# Patient Record
Sex: Female | Born: 1950 | Race: White | Hispanic: No | Smoking: Never smoker
Health system: Southern US, Community
[De-identification: ages and names within clinical notes are randomized; demographics above are authoritative.]

## PROBLEM LIST (undated history)

## (undated) DIAGNOSIS — M26609 Unspecified temporomandibular joint disorder, unspecified side: Secondary | ICD-10-CM

## (undated) DIAGNOSIS — Z8489 Family history of other specified conditions: Secondary | ICD-10-CM

## (undated) DIAGNOSIS — M1990 Unspecified osteoarthritis, unspecified site: Secondary | ICD-10-CM

## (undated) DIAGNOSIS — K219 Gastro-esophageal reflux disease without esophagitis: Secondary | ICD-10-CM

## (undated) DIAGNOSIS — S0300XA Dislocation of jaw, unspecified side, initial encounter: Secondary | ICD-10-CM

## (undated) DIAGNOSIS — G4730 Sleep apnea, unspecified: Secondary | ICD-10-CM

## (undated) DIAGNOSIS — M797 Fibromyalgia: Secondary | ICD-10-CM

## (undated) DIAGNOSIS — M2550 Pain in unspecified joint: Secondary | ICD-10-CM

## (undated) DIAGNOSIS — F32A Depression, unspecified: Secondary | ICD-10-CM

## (undated) DIAGNOSIS — K222 Esophageal obstruction: Secondary | ICD-10-CM

## (undated) DIAGNOSIS — K589 Irritable bowel syndrome without diarrhea: Secondary | ICD-10-CM

## (undated) DIAGNOSIS — T7840XA Allergy, unspecified, initial encounter: Secondary | ICD-10-CM

## (undated) DIAGNOSIS — F329 Major depressive disorder, single episode, unspecified: Secondary | ICD-10-CM

## (undated) DIAGNOSIS — E785 Hyperlipidemia, unspecified: Secondary | ICD-10-CM

## (undated) DIAGNOSIS — R55 Syncope and collapse: Secondary | ICD-10-CM

## (undated) DIAGNOSIS — L719 Rosacea, unspecified: Secondary | ICD-10-CM

## (undated) DIAGNOSIS — F419 Anxiety disorder, unspecified: Secondary | ICD-10-CM

## (undated) DIAGNOSIS — R531 Weakness: Secondary | ICD-10-CM

## (undated) DIAGNOSIS — M2540 Effusion, unspecified joint: Secondary | ICD-10-CM

## (undated) DIAGNOSIS — M549 Dorsalgia, unspecified: Secondary | ICD-10-CM

## (undated) DIAGNOSIS — K649 Unspecified hemorrhoids: Secondary | ICD-10-CM

## (undated) DIAGNOSIS — G8929 Other chronic pain: Secondary | ICD-10-CM

## (undated) DIAGNOSIS — M722 Plantar fascial fibromatosis: Secondary | ICD-10-CM

## (undated) DIAGNOSIS — Z8709 Personal history of other diseases of the respiratory system: Secondary | ICD-10-CM

## (undated) DIAGNOSIS — H269 Unspecified cataract: Secondary | ICD-10-CM

## (undated) HISTORY — DX: Depression, unspecified: F32.A

## (undated) HISTORY — DX: Esophageal obstruction: K22.2

## (undated) HISTORY — DX: Unspecified temporomandibular joint disorder, unspecified side: M26.609

## (undated) HISTORY — DX: Irritable bowel syndrome, unspecified: K58.9

## (undated) HISTORY — DX: Rosacea, unspecified: L71.9

## (undated) HISTORY — DX: Major depressive disorder, single episode, unspecified: F32.9

## (undated) HISTORY — PX: VARICOSE VEIN SURGERY: SHX832

## (undated) HISTORY — DX: Gastro-esophageal reflux disease without esophagitis: K21.9

## (undated) HISTORY — PX: EYE SURGERY: SHX253

## (undated) HISTORY — DX: Dislocation of jaw, unspecified side, initial encounter: S03.00XA

## (undated) HISTORY — DX: Unspecified osteoarthritis, unspecified site: M19.90

## (undated) HISTORY — PX: COLONOSCOPY: SHX174

## (undated) HISTORY — DX: Fibromyalgia: M79.7

## (undated) HISTORY — PX: COLONOSCOPY WITH ESOPHAGOGASTRODUODENOSCOPY (EGD) AND ESOPHAGEAL DILATION (ED): SHX6495

## (undated) HISTORY — DX: Plantar fascial fibromatosis: M72.2

## (undated) HISTORY — PX: FOOT SURGERY: SHX648

## (undated) HISTORY — DX: Unspecified cataract: H26.9

## (undated) HISTORY — DX: Syncope and collapse: R55

## (undated) HISTORY — DX: Allergy, unspecified, initial encounter: T78.40XA

## (undated) HISTORY — PX: UPPER GASTROINTESTINAL ENDOSCOPY: SHX188

## (undated) HISTORY — DX: Hyperlipidemia, unspecified: E78.5

---

## 1968-07-12 HISTORY — PX: NASAL SEPTUM SURGERY: SHX37

## 1979-07-13 HISTORY — PX: TONSILLECTOMY: SUR1361

## 1992-07-12 HISTORY — PX: KNEE ARTHROSCOPY: SUR90

## 1995-07-13 HISTORY — PX: ABDOMINAL HYSTERECTOMY: SHX81

## 1998-06-18 ENCOUNTER — Other Ambulatory Visit: Admission: RE | Admit: 1998-06-18 | Discharge: 1998-06-18 | Payer: Self-pay | Admitting: *Deleted

## 1999-06-23 ENCOUNTER — Other Ambulatory Visit: Admission: RE | Admit: 1999-06-23 | Discharge: 1999-06-23 | Payer: Self-pay | Admitting: Obstetrics and Gynecology

## 2001-10-27 ENCOUNTER — Emergency Department (HOSPITAL_COMMUNITY): Admission: EM | Admit: 2001-10-27 | Discharge: 2001-10-27 | Payer: Self-pay | Admitting: Emergency Medicine

## 2001-10-27 ENCOUNTER — Encounter: Payer: Self-pay | Admitting: Emergency Medicine

## 2002-07-12 HISTORY — PX: CARPAL TUNNEL RELEASE: SHX101

## 2002-09-20 ENCOUNTER — Encounter: Payer: Self-pay | Admitting: Internal Medicine

## 2002-10-12 ENCOUNTER — Ambulatory Visit (HOSPITAL_COMMUNITY): Admission: RE | Admit: 2002-10-12 | Discharge: 2002-10-12 | Payer: Self-pay | Admitting: Orthopedic Surgery

## 2002-12-07 ENCOUNTER — Ambulatory Visit (HOSPITAL_COMMUNITY): Admission: RE | Admit: 2002-12-07 | Discharge: 2002-12-07 | Payer: Self-pay | Admitting: Orthopedic Surgery

## 2003-01-25 ENCOUNTER — Encounter: Admission: RE | Admit: 2003-01-25 | Discharge: 2003-02-20 | Payer: Self-pay | Admitting: Orthopedic Surgery

## 2003-06-24 ENCOUNTER — Encounter: Admission: RE | Admit: 2003-06-24 | Discharge: 2003-07-02 | Payer: Self-pay | Admitting: Orthopedic Surgery

## 2003-07-13 HISTORY — PX: TRIGGER FINGER RELEASE: SHX641

## 2004-08-26 ENCOUNTER — Encounter: Admission: RE | Admit: 2004-08-26 | Discharge: 2004-09-29 | Payer: Self-pay | Admitting: Orthopedic Surgery

## 2004-11-13 ENCOUNTER — Ambulatory Visit (HOSPITAL_BASED_OUTPATIENT_CLINIC_OR_DEPARTMENT_OTHER): Admission: RE | Admit: 2004-11-13 | Discharge: 2004-11-13 | Payer: Self-pay | Admitting: Orthopedic Surgery

## 2004-11-13 ENCOUNTER — Encounter (INDEPENDENT_AMBULATORY_CARE_PROVIDER_SITE_OTHER): Payer: Self-pay | Admitting: Specialist

## 2004-11-13 ENCOUNTER — Ambulatory Visit (HOSPITAL_COMMUNITY): Admission: RE | Admit: 2004-11-13 | Discharge: 2004-11-13 | Payer: Self-pay | Admitting: Orthopedic Surgery

## 2005-01-04 ENCOUNTER — Ambulatory Visit: Payer: Self-pay | Admitting: Internal Medicine

## 2005-02-01 ENCOUNTER — Encounter: Admission: RE | Admit: 2005-02-01 | Discharge: 2005-03-31 | Payer: Self-pay | Admitting: Family Medicine

## 2005-04-07 ENCOUNTER — Ambulatory Visit: Payer: Self-pay | Admitting: Internal Medicine

## 2005-08-27 ENCOUNTER — Ambulatory Visit (HOSPITAL_BASED_OUTPATIENT_CLINIC_OR_DEPARTMENT_OTHER): Admission: RE | Admit: 2005-08-27 | Discharge: 2005-08-27 | Payer: Self-pay | Admitting: Orthopedic Surgery

## 2005-08-27 ENCOUNTER — Encounter (INDEPENDENT_AMBULATORY_CARE_PROVIDER_SITE_OTHER): Payer: Self-pay | Admitting: Specialist

## 2006-01-21 ENCOUNTER — Ambulatory Visit (HOSPITAL_BASED_OUTPATIENT_CLINIC_OR_DEPARTMENT_OTHER): Admission: RE | Admit: 2006-01-21 | Discharge: 2006-01-21 | Payer: Self-pay | Admitting: Orthopedic Surgery

## 2006-01-21 HISTORY — PX: KNEE SURGERY: SHX244

## 2006-12-13 ENCOUNTER — Ambulatory Visit: Payer: Self-pay | Admitting: Internal Medicine

## 2006-12-13 LAB — CONVERTED CEMR LAB
ALT: 18 units/L (ref 0–40)
AST: 17 units/L (ref 0–37)
Albumin: 4.3 g/dL (ref 3.5–5.2)
Alkaline Phosphatase: 68 units/L (ref 39–117)
BUN: 18 mg/dL (ref 6–23)
Basophils Absolute: 0 10*3/uL (ref 0.0–0.1)
Basophils Relative: 0.5 % (ref 0.0–1.0)
Bilirubin, Direct: 0.1 mg/dL (ref 0.0–0.3)
CO2: 31 meq/L (ref 19–32)
Calcium: 9.5 mg/dL (ref 8.4–10.5)
Chloride: 101 meq/L (ref 96–112)
Creatinine, Ser: 0.6 mg/dL (ref 0.4–1.2)
Eosinophils Absolute: 0.2 10*3/uL (ref 0.0–0.6)
Eosinophils Relative: 2.1 % (ref 0.0–5.0)
GFR calc Af Amer: 133 mL/min
GFR calc non Af Amer: 110 mL/min
Glucose, Bld: 102 mg/dL — ABNORMAL HIGH (ref 70–99)
HCT: 44.3 % (ref 36.0–46.0)
Hemoglobin: 15.4 g/dL — ABNORMAL HIGH (ref 12.0–15.0)
Lymphocytes Relative: 41.2 % (ref 12.0–46.0)
MCHC: 34.7 g/dL (ref 30.0–36.0)
MCV: 90.5 fL (ref 78.0–100.0)
Monocytes Absolute: 0.6 10*3/uL (ref 0.2–0.7)
Monocytes Relative: 8.3 % (ref 3.0–11.0)
Neutro Abs: 3.6 10*3/uL (ref 1.4–7.7)
Neutrophils Relative %: 47.9 % (ref 43.0–77.0)
Platelets: 320 10*3/uL (ref 150–400)
Potassium: 4.8 meq/L (ref 3.5–5.1)
RBC: 4.9 M/uL (ref 3.87–5.11)
RDW: 11.9 % (ref 11.5–14.6)
Sodium: 138 meq/L (ref 135–145)
Total Bilirubin: 0.8 mg/dL (ref 0.3–1.2)
Total Protein: 7.3 g/dL (ref 6.0–8.3)
WBC: 7.5 10*3/uL (ref 4.5–10.5)

## 2007-12-02 DIAGNOSIS — K219 Gastro-esophageal reflux disease without esophagitis: Secondary | ICD-10-CM

## 2007-12-02 DIAGNOSIS — K222 Esophageal obstruction: Secondary | ICD-10-CM | POA: Insufficient documentation

## 2007-12-02 DIAGNOSIS — F341 Dysthymic disorder: Secondary | ICD-10-CM

## 2007-12-02 DIAGNOSIS — K589 Irritable bowel syndrome without diarrhea: Secondary | ICD-10-CM

## 2007-12-02 DIAGNOSIS — R55 Syncope and collapse: Secondary | ICD-10-CM | POA: Insufficient documentation

## 2007-12-02 DIAGNOSIS — M797 Fibromyalgia: Secondary | ICD-10-CM

## 2007-12-26 ENCOUNTER — Telehealth: Payer: Self-pay | Admitting: Internal Medicine

## 2008-01-24 ENCOUNTER — Ambulatory Visit: Payer: Self-pay | Admitting: Internal Medicine

## 2008-01-24 DIAGNOSIS — R1031 Right lower quadrant pain: Secondary | ICD-10-CM

## 2008-01-24 DIAGNOSIS — R1310 Dysphagia, unspecified: Secondary | ICD-10-CM

## 2008-01-24 LAB — CONVERTED CEMR LAB
BUN: 14 mg/dL (ref 6–23)
Creatinine, Ser: 0.8 mg/dL (ref 0.4–1.2)

## 2008-01-30 ENCOUNTER — Ambulatory Visit: Payer: Self-pay | Admitting: Cardiology

## 2008-02-26 ENCOUNTER — Ambulatory Visit: Payer: Self-pay | Admitting: Internal Medicine

## 2008-02-26 LAB — HM COLONOSCOPY

## 2008-10-10 LAB — HM DEXA SCAN

## 2009-07-12 HISTORY — PX: OTHER SURGICAL HISTORY: SHX169

## 2009-10-06 ENCOUNTER — Ambulatory Visit: Payer: Self-pay | Admitting: Internal Medicine

## 2010-05-19 ENCOUNTER — Telehealth: Payer: Self-pay | Admitting: Internal Medicine

## 2010-07-12 HISTORY — PX: VITRECTOMY: SHX106

## 2010-07-12 HISTORY — PX: NISSEN FUNDOPLICATION: SHX2091

## 2010-07-24 ENCOUNTER — Encounter: Payer: Self-pay | Admitting: Internal Medicine

## 2010-07-28 ENCOUNTER — Telehealth: Payer: Self-pay | Admitting: Internal Medicine

## 2010-08-13 NOTE — Medication Information (Signed)
Summary: Dexilant/[facility]  Dexilant/[facility]   Imported By: Sherian Rein 07/30/2010 10:48:33  _____________________________________________________________________  External Attachment:    Type:   Image     Comment:   External Document

## 2010-08-13 NOTE — Progress Notes (Signed)
Summary: Medication refill  Phone Note Call from Patient Call back at [phone]   Caller: Patient Call For: Dr. Marina Goodell Reason for Call: Refill Medication Summary of Call: Needs a refill on her Dexilant sent to [facility] [phone] Initial call taken by: Karna Christmas,  July 28, 2010 9:16 AM  Follow-up for Phone Call        Rx. was faxed already to [facility].  [facility] [address]  July 30, 2010 8:59 AM

## 2010-08-13 NOTE — Assessment & Plan Note (Signed)
Summary: Worsening GERD   History of Present Illness Visit Type: Follow-up Visit Primary GI MD: Yancey Flemings MD Primary Provider: Georgiann Mccoy Requesting Provider: N/A Chief Complaint: GERD/Hiatal hernia discussion History of Present Illness:   60 year old white female with fibromyalgia, anxiety/depression, irritable bowel syndrome, and gastroesophageal reflux disease with peptic stricture. The patient was last evaluated in the office January 24, 2008 regarding abdominal pain, dysphagia, and recall colonoscopy. Colonoscopy and upper endoscopy were performed February 26, 2008. Colonoscopy was normal except for moderate sigmoid diverticulosis. Upper endoscopy revealed a peptic stricture, small hiatal hernia, and benign fundic gland polyps. She was dilated with the 54 [place] Maloney dilator GERD for reflux she takes omeprazole 20 mg daily. Today she is upset and concerned about ongoing and worsening reflux symptoms. She describes breakthrough heartburn as well as regurgitation. Symptoms are worse off medication. No recurrent dysphagia. She tells me that she is under a lot of stress at work. Stress seems to exacerbate symptoms. As well certain food items seem to exacerbate symptoms. She inquire about antireflux surgery. No nausea or vomiting. No weight loss.   GI Review of Systems    Reports abdominal pain, acid reflux, and  heartburn.     Location of  Abdominal pain: epigastric area.    Denies belching, bloating, chest pain, dysphagia with liquids, dysphagia with solids, loss of appetite, nausea, vomiting, vomiting blood, weight loss, and  weight gain.      Reports change in bowel habits and  irritable bowel syndrome.     Denies anal fissure, black tarry stools, constipation, diarrhea, diverticulosis, fecal incontinence, heme positive stool, hemorrhoids, jaundice, light color stool, liver problems, rectal bleeding, and  rectal pain. Preventive Screening-Counseling & Management  Alcohol-Tobacco  Smoking Status: never    Current Medications (verified): 1)  Metrogel 1 %  Gel (Metronidazole) .... As Needed 2)  Fexofenadine Hcl 180 Mg  Tabs (Fexofenadine Hcl) .... Take 1 Once Daily As Needed 3)  Omeprazole 20 Mg  Tbec (Omeprazole) .... Take 1 Once Daily 4)  Cymbalta 60 Mg  Cpep (Duloxetine Hcl) .... Take 1 Once Daily 5)  Crestor 10 Mg  Tabs (Rosuvastatin Calcium) .... Take I Once Daily 6)  Delphin 7.5mg  .... Once Daily 7)  Calcium Carbonate 333mg  .... Take 3 Tablets Daily 8)  Centrum Silver  Tabs (Multiple Vitamins-Minerals) .... Once Daily 9)  Aspir-Low 81 Mg Tbec (Aspirin) .... Once Daily 10)  Vitamin C Cr 500 Mg Cr-Tabs (Ascorbic Acid) .... Once Daily 11)  Fiber 625 Mg Tabs (Calcium Polycarbophil) .... Take 2 Tablets Daily 12)  Probiotic  Caps (Probiotic Product) .... Once Daily  Allergies: 1)  ! Flagyl 2)  ! Penicillin 3)  ! Ceftin 4)  ! * Cataflan 5)  ! * Chlorzoxazone 6)  ! * Oxycodone 7)  ! * Glucosamine 8)  ! Toradol 9)  ! Vicodin 10)  ! Olena Mater  Past History:  Past Medical History: Reviewed history from 12/02/2007 and no changes required. Current Problems:  GERD (ICD-530.81) ESOPHAGEAL STRICTURE (ICD-530.3) DEPRESSION/ANXIETY (ICD-300.4) VASOVAGAL SYNCOPE (ICD-780.2) FIBROMYALGIA (ICD-729.1) IBS (ICD-564.1)  Past Surgical History: Reviewed history from 01/24/2008 and no changes required. DEVIATED SEPTUM 1970 TONSILLECTOMY 1981 RT KNEE SURGERY 1994 COMPLETE HYSTERECTOMY 1997 CARPEL TUNNEL SURGERY RT AND LT 2004 LF FOOT SURGERY 11-20-04, 08-27-05 RT KNEE SURGERY 01-21-06 LF LEG VARICOSE VEIN REMOVED 12/07 RT LEG VARICOSE VEIN REMOVED 4/09  Family History: Reviewed history from 01/24/2008 and no changes required. FATHER-LUNG, SKIN AND PANCREATIC CANCER MOTHER-BREAST CANCER MOTHER HYPERTENSIONJ DIVERTICULITIS MOTHER  Social History: WORKS AT [employer] ONE CUP OF TEA DAILY SOCIAL ALCOHOL RARELY Patient has never smoked.  Alcohol Use  - no Smoking Status:  never  Review of Systems       The patient complains of allergy/sinus, arthritis/joint pain, back pain, depression-new, fatigue, night sweats, sleeping problems, and urine leakage.  The patient denies anemia, anxiety-new, blood in urine, breast changes/lumps, change in vision, confusion, cough, coughing up blood, fainting, fever, hearing problems, heart murmur, heart rhythm changes, itching, menstrual pain, muscle pains/cramps, nosebleeds, pregnancy symptoms, shortness of breath, skin rash, sore throat, swelling of feet/legs, swollen lymph glands, thirst - excessive , urination - excessive , urination changes/pain, vision changes, and voice change.    Vital Signs:  Patient profile:   60 year old female Height:      65 inches Weight:      173.25 pounds BMI:     28.93 Pulse rate:   92 / minute Pulse rhythm:   regular BP sitting:   120 / 70  (left arm) Cuff size:   regular  Vitals Entered By: June McMurray CMA Duncan Dull) (October 06, 2009 4:01 PM)  Physical Exam  General:  Well developed, well nourished, no acute distress. Head:  Normocephalic and atraumatic. Eyes:  PERRLA, no icterus. Ears:  Normal auditory acuity. Nose:  No deformity, discharge,  or lesions. Mouth:  No deformity or lesions, dentition normal. Neck:  Supple; no masses or thyromegaly. Lungs:  Clear throughout to auscultation. Heart:  Regular rate and rhythm; no murmurs, rubs,  or bruits. Abdomen:  Soft, nontender and nondistended. No masses, hepatosplenomegaly or hernias noted. Normal bowel sounds. Msk:  Symmetrical with no gross deformities. Normal posture. Pulses:  Normal pulses noted. Extremities:  No clubbing, cyanosis, edema or deformities noted. Neurologic:  Alert and  oriented x4;  grossly normal neurologically. Skin:  Intact without significant lesions or rashes. Psych:  Alert and cooperative. Normal mood and affect.   Impression & Recommendations:  Problem # 1:  GERD (ICD-530.81) The  patient seems to be experiencing worsening GERD symptoms as manifested by breakthrough pyrosis and regurgitation. We discussed medical treatment options. As well, since she inquired about antireflux surgery, we discussed this as well. To that end, would have decided on the following plan.  Plan: #1. Change from omeprazole 20 mg daily to Dexilant 60 mg daily. 25 days of samples provided. If this is effective, she will contact the office and we will provide a prescription #2. Reflux precautions #3. The patient might be interested in meeting with a general surgeon to discuss in more detail the pros and cons of antireflux surgery. However, she wishes to wait to see how Dexilant therapy works. We did discuss that if she were to move forward with an antireflux surgery, that esophageal manometry might be indicated.  Problem # 2:  ESOPHAGEAL STRICTURE (ICD-530.3) asymptomatic post dilation.  Plan: #1. Repeat esophageal dilation as needed for recurrent dysphagia  Problem # 3:  SCREENING COLORECTAL-CANCER (ICD-V76.51) negative screening exam August 2009. Routine followup screening around August 2019  Patient Instructions: 1)  Dexilant samples given to patient for 5 weeks to take one capsule by mouth once daily 2)  Call office back if Dexilant is working and a Rx. will be phoned in for you. 3)  If it doesn't work let our office know and we will possibly refer you to a surgeon for evaluation. 4)  The medication list was reviewed and reconciled.  All changed / newly prescribed medications were explained.  A complete medication list was provided to the patient / caregiver. 5)  printed and given to patient.[facility]  October 06, 2009 4:53 PM

## 2010-08-13 NOTE — Progress Notes (Signed)
Summary: Medication  Phone Note Call from Patient Call back at Home Phone [phone]   Caller: Patient Call For: Dr. Marina Goodell Reason for Call: Talk to Nurse Summary of Call: Needs a script for Dexilant 90 day supply.....[facility] Initial call taken by: Karna Christmas,  May 19, 2010 8:50 AM    Prescriptions: DEXILANT 60 MG CPDR (DEXLANSOPRAZOLE) 1 capsule by mouth once daily  #90 x 3   Entered by:   Milford Cage NCMA   Authorized by:   Hilarie Fredrickson MD   Signed by:   Milford Cage NCMA on 05/19/2010   Method used:   [facility]        [phone][facility] (retail)       [address]       [place], [place]  [address]       Ph: [phone]       Fax: [phone]   RxID:   [id number]

## 2010-08-31 ENCOUNTER — Ambulatory Visit (HOSPITAL_BASED_OUTPATIENT_CLINIC_OR_DEPARTMENT_OTHER)
Admission: RE | Admit: 2010-08-31 | Discharge: 2010-08-31 | Disposition: A | Discharge status: Home / Self Care | Payer: BC Managed Care – PPO | Attending: Orthopedic Surgery | Admitting: Orthopedic Surgery

## 2010-08-31 DIAGNOSIS — M23302 Other meniscus derangements, unspecified lateral meniscus, unspecified knee: Secondary | ICD-10-CM | POA: Insufficient documentation

## 2010-08-31 DIAGNOSIS — M2240 Chondromalacia patellae, unspecified knee: Secondary | ICD-10-CM | POA: Insufficient documentation

## 2010-08-31 DIAGNOSIS — M23329 Other meniscus derangements, posterior horn of medial meniscus, unspecified knee: Secondary | ICD-10-CM | POA: Insufficient documentation

## 2010-08-31 LAB — POCT HEMOGLOBIN-HEMACUE: Hemoglobin: 15.2 g/dL — ABNORMAL HIGH (ref 12.0–15.0)

## 2010-09-04 NOTE — Op Note (Signed)
NAMELARYA, Vanessa Oliver              ACCOUNT NO.:  [id number]  MEDICAL RECORD NO.:  [id number]          PATIENT TYPE:  AMB  LOCATION:  [facility]                         FACILITY:  [facility]  PHYSICIAN:  Marlowe Kays, M.D.  DATE OF BIRTH:  April 09, 1951  DATE OF PROCEDURE:  08/31/2010 DATE OF DISCHARGE:                              OPERATIVE REPORT   PREOPERATIVE DIAGNOSES: 1. Torn medial and lateral menisci. 2. Osteoarthritis, right knee.  POSTOPERATIVE DIAGNOSES: 1. Torn medial and lateral menisci. 2. Osteoarthritis, right knee.  OPERATIONS:  Right knee arthroscopy with: 1. Partial medial lateral meniscectomies. 2. Shaving of lateral femoral condyle. 3. Shaving of patella.  SURGEON:  Marlowe Kays, M.D.  ASSISTANT:  Nurse.  ANESTHESIA:  General.  PATHOLOGICAL INDICATION FOR PROCEDURE:  She has had a prior knee arthroscopy with a reinjury recently and an MRI demonstrating the preoperative diagnoses which were confirmed at surgery as discussed below.  DESCRIPTION OF PROCEDURE:  Satisfactory general anesthesia, Ace wrap, and knee support to left lower extremity, pneumatic tourniquet applied to right lower extremity with leg esmarched out nonsterilely. Tourniquet inflated and thigh stabilizer applied.  Right leg was then prepped with DuraPrep from stabilizer to ankle and draped in sterile field.  Time-out performed.  Superior medial saline inflow.  First, an anterolateral portal medial compartment knee joint was evaluated.  She had some wear of the medial femoral condyle which did not require shaving.  I would estimate grade 2 out of 4 chondromalacia. Posteriorly, the entire posterior 40% of meniscus was significantly torn, and I trimmed this back with baskets and shaved it down until smooth and stable with a 3.5 shaver with final pictures being taken.  I then reversed portals.  In the lateral compartment of the knee joint, she had severe disruption of lateral meniscus with a  fragment in the intercondylar area adjacent to the ACL.  There was some very slight wear of the most lateral portion of the lateral femoral condyle which I smoothed down.  I resected the lateral meniscus back to stable rim with a combination of baskets, arthroscopic scissors, and 3.5 shaver with final remnant being a nice stable meniscus.  This also included removing the portion of the meniscus which was adjacent to the ACL.  Looking at the lateral gutter and suprapatellar area, she had a good bit of postoperative adhesions and some grade 2/4 chondromalacia of the mid to medial portion of the patella which I shaved down as much as possible. Knee joint was then irrigated until clear, all fluid possible removed. I closed the 2 entry portals with 4-0 nylon and injected 20 cc of 0.5% Marcaine with adrenaline and 4 mg of morphine through the inflow apparatus which was removed, and this portal was closed with 4-0 nylon as well.  Betadine, Adaptic, dry sterile dressing were applied.  Tourniquet was released. At the time of this dictation, she was on her own to the recovery room in satisfactory condition with no known complications.          ______________________________ Marlowe Kays, M.D.     JA/MEDQ  D:  08/31/2010  T:  08/31/2010  Job:  [id number]  Electronically Signed by Marlowe Kays M.D. on 09/04/2010 04:22:51 PM

## 2010-09-07 ENCOUNTER — Telehealth: Payer: Self-pay | Admitting: Internal Medicine

## 2010-09-17 NOTE — Progress Notes (Signed)
Summary: Triage: GERD -  wants  surgical referral  Phone Note Call from Patient Call back at [phone]   Caller: Patient Call For: Dr. Marina Goodell Reason for Call: Talk to Nurse Summary of Call: Was told she was going to be referred to another specialist for her henia Initial call taken by: Karna Christmas,  September 07, 2010 10:59 AM  Follow-up for Phone Call        Patient calling and stating that the Dexilant is not working anymore. Patient states at last office visit she discussed with Dr. Marina Goodell a referral for anti-reflux surgery. Patient would like to proceed with this  referral now. Dr. Marina Goodell please advise. Follow-up by: Selinda Michaels RN,  September 07, 2010 11:21 AM  Additional Follow-up for Phone Call Additional follow up Details #1::         have her see Dr. Luretha Murphy for his surgical opinion regarding possible antireflux surgery Additional Follow-up by: Hilarie Fredrickson MD,  September 07, 2010 11:27 AM    Additional Follow-up for Phone Call Additional follow up Details #2::    Appt made for patient with Dr. Luretha Murphy for 10/02/10. Patient to be there at 10:45am for an 11:05am appointment. Patient aware of appointment date and time. Follow-up by: Selinda Michaels RN,  September 07, 2010 12:56 PM

## 2010-10-02 ENCOUNTER — Other Ambulatory Visit: Payer: Self-pay | Admitting: Surgery

## 2010-10-09 ENCOUNTER — Ambulatory Visit
Admission: RE | Admit: 2010-10-09 | Discharge: 2010-10-09 | Disposition: A | Discharge status: Home / Self Care | Payer: BC Managed Care – PPO | Source: Ambulatory Visit | Attending: Surgery | Admitting: Surgery

## 2010-11-18 ENCOUNTER — Encounter (HOSPITAL_COMMUNITY): Payer: BC Managed Care – PPO

## 2010-11-18 ENCOUNTER — Other Ambulatory Visit: Payer: Self-pay | Admitting: Surgery

## 2010-11-18 DIAGNOSIS — Z01812 Encounter for preprocedural laboratory examination: Secondary | ICD-10-CM | POA: Insufficient documentation

## 2010-11-18 DIAGNOSIS — K449 Diaphragmatic hernia without obstruction or gangrene: Secondary | ICD-10-CM | POA: Insufficient documentation

## 2010-11-18 DIAGNOSIS — K319 Disease of stomach and duodenum, unspecified: Secondary | ICD-10-CM | POA: Insufficient documentation

## 2010-11-18 DIAGNOSIS — IMO0001 Reserved for inherently not codable concepts without codable children: Secondary | ICD-10-CM | POA: Insufficient documentation

## 2010-11-18 DIAGNOSIS — K589 Irritable bowel syndrome without diarrhea: Secondary | ICD-10-CM | POA: Insufficient documentation

## 2010-11-18 DIAGNOSIS — Z79899 Other long term (current) drug therapy: Secondary | ICD-10-CM | POA: Insufficient documentation

## 2010-11-18 DIAGNOSIS — K219 Gastro-esophageal reflux disease without esophagitis: Secondary | ICD-10-CM | POA: Insufficient documentation

## 2010-11-18 DIAGNOSIS — K5730 Diverticulosis of large intestine without perforation or abscess without bleeding: Secondary | ICD-10-CM | POA: Insufficient documentation

## 2010-11-18 LAB — CBC
HCT: 43.6 % (ref 36.0–46.0)
Platelets: 280 10*3/uL (ref 150–400)
RDW: 12.6 % (ref 11.5–15.5)
WBC: 7.4 10*3/uL (ref 4.0–10.5)

## 2010-11-18 LAB — SURGICAL PCR SCREEN
MRSA, PCR: NEGATIVE
Staphylococcus aureus: NEGATIVE

## 2010-11-24 NOTE — Assessment & Plan Note (Signed)
[facility]                         GASTROENTEROLOGY OFFICE NOTE   Vanessa Oliver, Vanessa Oliver                     MRN:          [id number]  DATE:12/13/2006                            DOB:          [birth year]    The patient is self-referred.   REASON FOR EVALUATION:  Abdominal pain, change in bowel habits.   HISTORY:  This is a 60 year old white female with a history of irritable  bowel syndrome, fibromyalgia, vasovagal syncope, and anxiety with  depression. She presents herself today regarding ongoing management of  reflux as well as persistent problems with abdominal discomfort and  loose stools. The patient was last evaluated in this office April 07, 2005. See that dictation for details. She seems to require the  proton pump inhibitor therapy for control of reflux symptoms. Her reflux  symptoms are fairly classic, such as heartburn or indigestion. Her last  endoscopy was performed in 2004 and revealed a distal esophageal  stricture, small hiatal hernia. She also has irritable bowel syndrome  with a tendency toward diarrhea. Last colonoscopy at that same time  revealed sigmoid diverticulosis. No other abnormalities. The ileum was  normal.   Currently, she reports just not feeling well. Specifically, she has  had problems with right lower quadrant abdominal cramping, followed by  urgency and loose stools. Symptoms are exacerbated by meals as well as  stress. She has been under a great deal of stress with such issues as  her mother-in-law living with her and her husband, restructuring at  work, and lots of new cats at home. She also tells me that she had to  stop Celexa because of dark thoughts and is now on Cymbalta. She  denies nausea, vomiting, fevers, melena or hematochezia. Some  breakthrough heartburn, though no dysphagia. She denies any new  medications. She has lost a few pounds, though she this was intentional  due to diet. In terms of  her symptoms, she has tried fiber  supplementation which seemed to exacerbate the symptoms. Modifying her  diet was not helpful. She even tried over-the-counter digestive enzymes  without improvement. She has no nocturnal symptoms. She states that the  current symptoms are similar to her irritable bowel syndrome though much  more intense. She states that she has had thyroid testing at her  gynecologists office which was normal. No other laboratories or workup.   CURRENT MEDICATIONS:  1. Metrogel.  2. Omeprazole.  3. Cymbalta.  4. Allegra.  5. Flonase.   SHE HAS MULTIPLE DRUG ALLERGIES AND INTOLERANCES INCLUDING:  1. FLAGYL.  2. PENICILLIN.  3. CEFTIN.  4. CATAFLAM.  5. CHLORZOXAZONE.  6. OXYCODONE.  7. GLUCOSAMINE.  8. TORADOL.  9. SULFA.  10.CLARITIN.  11.GUAIFENESIN.  12.PENTAZOCINE/NALOXONE.   PHYSICAL EXAMINATION:  Somewhat anxious female in no acute distress.  Blood pressure 108/70, heart rate 88. Weight: 164 pounds (increased 1.2  pounds).  HEENT: Sclerae anicteric. Conjunctivae are pink. Oral mucosa intact. No  adenopathy.  LUNGS:  Are clear.  HEART: Is regular.  ABDOMEN: Soft without significant tenderness. No mass or hernia. Good  bowel sounds heard.   IMPRESSION:  1. Gastroesophageal reflux disease. Mild occasional breakthrough      symptoms.  2. Recent problems with right lower quadrant cramping and abdominal      pain, urgency and loose stools. Most likely irritable bowel      syndrome exacerbated principally by stress and meals.   RECOMMENDATIONS:  1. Probiotic Align one daily for two weeks.  2. Librax one before meals.  3. Refill omeprazole 40 mg daily.  4. Screening laboratories including CBC and comprehensive metabolic      panel.  5. Office followup in 4 to 6 weeks.  6. Ongoing general medical care with Dr.  Aurea Graff.     Wilhemina Bonito. Marina Goodell, MD  Electronically Signed    JNP/MedQ  DD: 12/13/2006  DT: 12/13/2006  Job #: [id number]

## 2010-11-25 ENCOUNTER — Ambulatory Visit (HOSPITAL_COMMUNITY)
Admission: RE | Admit: 2010-11-25 | Discharge: 2010-11-27 | Disposition: A | Discharge status: Home / Self Care | Payer: BC Managed Care – PPO | Source: Ambulatory Visit | Attending: Surgery | Admitting: Surgery

## 2010-11-25 DIAGNOSIS — K449 Diaphragmatic hernia without obstruction or gangrene: Secondary | ICD-10-CM | POA: Insufficient documentation

## 2010-11-25 DIAGNOSIS — K319 Disease of stomach and duodenum, unspecified: Secondary | ICD-10-CM | POA: Insufficient documentation

## 2010-11-25 DIAGNOSIS — K219 Gastro-esophageal reflux disease without esophagitis: Secondary | ICD-10-CM | POA: Insufficient documentation

## 2010-11-26 ENCOUNTER — Ambulatory Visit (HOSPITAL_COMMUNITY): Payer: BC Managed Care – PPO

## 2010-11-26 LAB — CBC
HCT: 40.7 % (ref 36.0–46.0)
Hemoglobin: 13.7 g/dL (ref 12.0–15.0)
MCH: 30.5 pg (ref 26.0–34.0)
MCHC: 33.7 g/dL (ref 30.0–36.0)
RDW: 12.4 % (ref 11.5–15.5)

## 2010-11-26 NOTE — Op Note (Signed)
  Vanessa Oliver, Vanessa Oliver              ACCOUNT NO.:  [id number]  MEDICAL RECORD NO.:  [id number]           PATIENT TYPE:  O  LOCATION:  [facility]                         FACILITY:  [facility]  PHYSICIAN:  Thornton Park. Daphine Deutscher, MD  DATE OF BIRTH:  [birth year]  DATE OF PROCEDURE:  11/25/2010 DATE OF DISCHARGE:                              OPERATIVE REPORT   CHIEF COMPLAINT:  GERD and terrible reflux, evidence of peptic stricture on endoscopy and upper GI.  PROCEDURE:  Laparoscopic repair of hiatal hernia with 2 sutures posteriorly including one with pledgets and Nissen fundoplication over a #56 dilator.  SURGEON:  Thornton Park. Daphine Deutscher, MD  ASSISTANT:  Ollen Gross. Vernell Morgans, M.D.  ANESTHESIA:  General endotracheal.  DESCRIPTION OF PROCEDURE:  This 60 year old white female was taken to room #1 on Wednesday, Nov 25, 2010, and given general anesthesia.  The abdomen was prepped with PCMX and draped sterilely.  Appropriate time- out was performed.  Access to the abdomen was achieved using a 0-degrees 10-mm Optiview without difficulty.  The abdomen was entered and insufflated.  Following insufflation, I placed standard trocars and I used a second 10 on the left for the camera, 10 to the right of midline and a 5 laterally on the right.  The Nathanson retractor was placed through an upper midline little incision and this retracted liver nicely exposing the hiatal hernia.  Hiatal hernia was reduced and taken down by tracking on the stomach and then using harmonic scalpel to take down the sac.  This was done really and it came down nicely, exposing the distal esophagus nicely and reintroducing the distal esophagus into the abdomen.  There was no tension on that.  I then got around first by delineating the right crus and then going over and taking down short gastrics and then doing a complete mobilization posterior.  Once this was done, with a Penrose around and a good exposure, I placed 2 sutures posteriorly,  the most posterior one without pledget and one up and more anterior had a pledget that approximated the hiatus nicely.  I felt like about what we needed to go with a 56 dilator.  Also the cardia was freely mobile and would nicely invaginate around the distal esophagus. I went ahead and passed a 56 dilator by Dr. Okey Dupre and then I performed the Nissen wrap using 3 sutures, the Endo stitch and tie knots.  Tie knots were also used in closing the crura.  A nice wrap was present. Minimal bleeding was noted and everything was controlled.  Wounds were injected with Marcaine and trocars were withdrawn and the skin was closed 4-0 Vicryl with Dermabond.  The patient was taken to the recovery room in satisfactory condition.     Thornton Park Daphine Deutscher, MD     MBM/MEDQ  D:  11/25/2010  T:  11/25/2010  Job:  [id number]  cc:   Bernadette Hoit, MD Fax: [phone]  Wilhemina Bonito. Marina Goodell, MD [address] [place] [address]  Electronically Signed by Luretha Murphy MD on 11/26/2010 07:08:57 AM

## 2010-11-27 NOTE — Op Note (Signed)
Vanessa Oliver, LAUFER              ACCOUNT NO.:  [id number]   MEDICAL RECORD NO.:  [id number]          PATIENT TYPE:  AMB   LOCATION:  [facility]                         FACILITY:  [facility]   PHYSICIAN:  Marlowe Kays, M.D.  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   OPERATION:  Excision of Morton's neuroma second and third web space left  foot.   SURGEON:  Marlowe Kays, M.D.   ASSISTANT:  Nurse.   ANESTHESIA:  General.   PATHOLOGY AND JUSTIFICATION FOR PROCEDURE:  She has had signs and symptoms  of Morton's neuroma with tenderness in the second and third web space with  temporary relief only with Xylocaine and steroid injection. Consequently she  is here for the above mentioned surgery.   DESCRIPTION OF PROCEDURE:  Satisfactory general anesthesia, pneumatic  tourniquet, leg was esmarched out nonsterilely and prepped with DuraPrep  from mid calf to toes and draped in a sterile field. I made a dorsal web  splitting incision splitting the second and third toes and with blunt  dissection dissected out a large Morton's neuroma. I grasped this with an  Allis clamp, using cutting cautery cut the distal digital branches to the  second and third toes and then freed up the congeal mass of nerve and scar  and amputated it proximal to the metatarsal heads. I then checked to make  sure there was no residual nerve remaining, specimen sent to pathology. I  released the tourniquet, there was no unusual bleeding. I infiltrated the  wound with 0.5% plain Marcaine and reapproximated the subcutaneous tissue  with interrupted 3-0 Vicryl and skin with interrupted 4-0 nylon. Betadine  Adaptic dry sterile dressing were applied. She tolerated the procedure well  and at  the time of this dictation was on her way to the recovery room in  satisfactory condition with no known complications.      JA/MEDQ  D:  11/13/2004  T:  11/13/2004  Job:  [id number]

## 2010-11-27 NOTE — Op Note (Signed)
   NAME:  NYKIAH, Vanessa Oliver                        ACCOUNT NO.:  [id number]   MEDICAL RECORD NO.:  [id number]                   PATIENT TYPE:  AMB   LOCATION:  DAY                                  FACILITY:  [facility]   PHYSICIAN:  Marlowe Kays, M.D.               DATE OF BIRTH:  11/28/1950   DATE OF PROCEDURE:  12/07/2002  DATE OF DISCHARGE:                                 OPERATIVE REPORT   PREOPERATIVE DIAGNOSES:  Left carpal tunnel syndrome, status post right  carpal tunnel release.   POSTOPERATIVE DIAGNOSES:  Left carpal tunnel syndrome, status post right  carpal tunnel release.   OPERATION:  Decompression of the median nerve, left wrist and hand.   SURGEON:  Illene Labrador. [place], M.D.   ASSISTANT:  Nurse.   ANESTHESIA:  IV regional.   PATHOLOGY AND JUSTIFICATION FOR PROCEDURE:  She has had an excellent result  from release on the right and is ready for release on the left.  Diagnosis  was established via her symptoms and nerve conduction studies.   DESCRIPTION OF PROCEDURE:  Satisfactory IV regional anesthesia, DuraPrep  from mid forearm to fingertips and was draped in a sterile field.  I marked  out a curved incision along the base of the thenar eminence, crossing  obliquely over the flexor crease of the wrist and the distal forearm.  The  palmaris longus tendon was identified and retracted and beneath it, the  median nerve was identified.  I then progressively released skin and  subcutaneous tissue and fascia into the distal palm.  Potential bleeders  were coagulated with bipolar cautery.  The main compression was in the mid  palm with partly due to muscle belly but also fascia.  Dissection was  carried out with a small hemostat in the mid palm and distal palm.  When we  felt the release had been completed, the wound was irrigated with sterile  saline, and skin and subcutaneous tissue only closed with interrupted 4-0  nylon mattress sutures.  Betadine, Adaptic dry sterile  dressing, volar  plaster splint were applied.  She tolerated the procedure well and was taken  to the recovery room in satisfactory condition with no known complications.                                               Marlowe Kays, M.D.    JA/MEDQ  D:  12/07/2002  T:  12/07/2002  Job:  [id number]

## 2010-11-27 NOTE — Op Note (Signed)
NAMEMALLOREY, ODONELL              ACCOUNT NO.:  [id number]   MEDICAL RECORD NO.:  [id number]          PATIENT TYPE:  AMB   LOCATION:  [facility]                         FACILITY:  [facility]   PHYSICIAN:  Marlowe Kays, M.D.  DATE OF BIRTH:  1951/04/27   DATE OF PROCEDURE:  01/21/2006  DATE OF DISCHARGE:                                 OPERATIVE REPORT   PREOPERATIVE DIAGNOSIS:  Posterior horn tear medial meniscus, right knee.   POSTOPERATIVE DIAGNOSIS:  1.  Posterior horn tear medial meniscus.  2.  Tear lateral meniscus.  3.  chondromalacia medial facette patella, right knee.   OPERATION:  Right knee arthroscopy with:  1.  Partial medial and lateral meniscectomy.  2.  Shaving of medial facette patella.   SURGEON:  Marlowe Kays, M.D.   ASSISTANT:  Nurse.   ANESTHESIA:  General.   JUSTIFICATION FOR PROCEDURE:  She has had recent pain and swelling in her  right knee with pain mainly medially.  An MRI of the knee on December 12, 2005,  demonstrated a tear of the posterior horn of the medial meniscus with some  mild chondromalacia of the patella.  Because of her chronic symptomatology,  she is here today for surgical treatment.  See operative description below  for additional details of pathology.   PROCEDURE:  Satisfied general anesthesia, pneumatic tourniquet, leg was  Esmarch'd out non sterilely, thigh stabilizer applied, and the leg was  prepped with DuraPrep from thigh stabilizer to ankle and draped in a sterile  field.  Ace wrap and knee support for left knee.  Superior medial saline  inflow. First through an anterolateral portal, the medial compartment of the  knee joint was evaluated.  There was minimal wear of the medial femoral  condyle and some disruption of the anterior third weight bearing portion of  the medial meniscus which I shaved down until smooth. This did appear to be  digging into the medial femoral condyle. Posteriorly, she had a tear  perpendicular to the curve  of the meniscus and I resected this back to a  stable rim tapering the size of the meniscus to the side so that there was  smooth curve and smoothing all this down with 3.5 shaver.  The remaining  meniscus was probed and found to be stable.  I then looked up the medial  gutter and suprapatellar area. She had some mild wear of most of the patella  but medially had enough wear with fraying that I felt that debridement would  be of benefit and this was done with the 3.5 shaver.  I then reversed  portals.  At the anterior third of the lateral meniscus, she had a large  tongue of torn meniscus extending towards the ACL and this was potentially a  mechanical entrapment problem.  I excised this with some arthroscopic  scissors, shaving it down until smooth with an 8.5 shaver as well as shaving  down the entire anterior half of the lateral meniscus which had significant  fraying along its inner border.  The remainder of the lateral meniscus and  the joint surface looked  good.  The knee joint was then irrigated until  clear and all fluid possible removed.  The two anterior portals were closed  with 4-0 nylon.  I injected 20 mL 0.5% Marcaine but no  morphine into the joint through the inflow apparatus which I then removed  and closed this portal with 4-0 nylon, as well.  Betadine, Adaptic, and dry,  sterile dressing were applied.  The tourniquet was released.  She tolerated  the procedure well and was taken to the recovery room in satisfactory  condition with no known complications.           ______________________________  Marlowe Kays, M.D.     JA/MEDQ  D:  01/21/2006  T:  01/21/2006  Job:  [id number]

## 2010-11-27 NOTE — Op Note (Signed)
Vanessa Oliver, Vanessa Oliver              ACCOUNT NO.:  [id number]   MEDICAL RECORD NO.:  [id number]          PATIENT TYPE:  AMB   LOCATION:  [facility]                         FACILITY:  [facility]   PHYSICIAN:  Marlowe Kays, M.D.  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   SURGEON:  Marlowe Kays, M.D.   ASSISTANT:  Nurse.   ANESTHESIA:  General.   __________   INDICATIONS FOR PROCEDURE:  She has had a successful excision of one in the  second and third web space left foot. Present problem has been refractory to  steroid injections.   PROCEDURE:  Satisfactory general anesthesia, pneumatic tourniquet. Leg was  Esmarched out nonsterilely and prepped from mid calf to toes with DuraPrep  and draped in a sterile field. Made the dorsal web-splitting incision  between the third and fourth toes with a combination of blunt sharp  dissection. Isolated the scarred common digital nerve which was quite  enlarged and fibrotic. I grasped it with an Allis clamp and with a  combination of scissors and cutting cautery cut the neurovascular bundles to  the adjacent third and fourth toes and then freed up the congealed mass of  nerve and scar and amputated it proximal to the intermetatarsal head area. I  then released the tourniquet.  There was no unusual bleeding.  The wound was  then reapproximated with interrupted 3-0 Vicryl in the subcutaneous tissue  and interrupted simple and mattress 4-0 nylon sutures in the skin incision.  Betadine, Adaptic, and dry sterile dressing were applied after infiltrating  the wound with 0.5% plain Marcaine. She tolerated the procedure  well and was  taken to the recovery room in satisfactory condition, with no complications.           ______________________________  Marlowe Kays, M.D.     JA/MEDQ  D:  08/27/2005  T:  08/27/2005  Job:  [id number]

## 2010-11-27 NOTE — Op Note (Signed)
   NAME:  Vanessa Oliver, Vanessa Oliver                        ACCOUNT NO.:  [id number]   MEDICAL RECORD NO.:  [id number]                   PATIENT TYPE:  AMB   LOCATION:  DAY                                  FACILITY:  [facility]   PHYSICIAN:  Marlowe Kays, M.D.               DATE OF BIRTH:  October 08, 1950   DATE OF PROCEDURE:  10/12/2002  DATE OF DISCHARGE:                                 OPERATIVE REPORT   PREOPERATIVE DIAGNOSES:  Right carpal tunnel syndrome.   POSTOPERATIVE DIAGNOSES:  Right carpal tunnel syndrome.   OPERATION:  Decompression median nerve right wrist and hand.   SURGEON:  Marlowe Kays, M.D.   ASSISTANT:  Nurse.   ANESTHESIA:  IV regional.   PATHOLOGY AND JUSTIFICATION FOR PROCEDURE:  Signs and symptoms of carpal  tunnel syndrome confirmed by abnormal median nerve conduction studies. At  surgery, she had compression of the median nerve both proximal to the flexor  crease at the wrist and compression with discoloration in the mid palm.   DESCRIPTION OF PROCEDURE:  Satisfactory IV regional anesthesia, duraprep  from mid forearm to the fingertips and was draped in a sterile field. With a  marking pen, I marked out a curved incision along the base of thenar  eminence crossing obliquely at the flexor crease of the wrist and the distal  forearm. The palmaris longus tendon was identified and retracted radialward.  The median nerve was identified and carefully decompressed with loss of  circulation noted just proximal to the flexor crease at the wrist. Potential  bleedings were coagulated with bipolar cautery. I progressively released the  skin, subcutaneous tissue and fascia particularly in the mid palm where it  was very thick under direct visualization freeing up the nerve and the  individual branches were dissected out in the mid and distal palm. When it  felt like the decompression had been completed, the wound was irrigated with  sterile saline and closure performed with  skin and subcutaneous tissue  closed only with interrupted 4-0 nylon mattress sutures. Betadine Adaptic  dry sterile dressing, volar plaster splint were applied. The tourniquet was  released. She tolerated the procedure well and was taken to the recovery  room in satisfactory condition with no known complications.                                               Marlowe Kays, M.D.    JA/MEDQ  D:  10/12/2002  T:  10/13/2002  Job:  [id number]

## 2010-12-23 NOTE — Discharge Summary (Signed)
  Vanessa Oliver, Vanessa Oliver              ACCOUNT NO.:  [id number]  MEDICAL RECORD NO.:  [id number]           PATIENT TYPE:  O  LOCATION:  1523                         FACILITY:  [facility]  PHYSICIAN:  Thornton Park. Daphine Deutscher, MD  DATE OF BIRTH:  1950-11-14  DATE OF ADMISSION:  11/25/2010 DATE OF DISCHARGE:  11/27/2010                              DISCHARGE SUMMARY   PREOPERATIVE DIAGNOSIS:  Intractable gastroesophageal reflux with history of peptic stricture.  PROCEDURE:  Laparoscopic Nissen fundoplication done with 2 posterior suture closure of the hiatus with the Nissen done over a #56 dilator, 3 suture wrap.  COURSE IN THE HOSPITAL:  Nadiyah Zeis was taken to the OR on the day of admission.  She was found to have a fairly significant hiatal hernia, certainly more than was expected on the upper GI or endoscopy.  This, however, correlated nicely with her symptoms.  She underwent the above- mentioned operation.  On postop day #1, she had a swallow which showed intact good wrap that was patent, no leaks.  She was started on clear liquids which she tolerated nicely.  She was ready for discharge on postop day #2.  She was given Roxicet elixir to have in case she needed for pain and also some Phenergan suppositories #12 (25 mg) to have on hand if needed in case of nausea.  She was instructed to return to the office in 3 to 4 weeks.  CONDITION:  Good.  Med rec done.  She can return to all her medicines basically except she did not need to be on her Dexilant any more.  Her diet is to be full liquids x7 days, then pureed diet x4 weeks.     Thornton Park Daphine Deutscher, MD     MBM/MEDQ  D:  11/27/2010  T:  11/27/2010  Job:  [id number]  cc:   Bernadette Hoit, MD Fax: [phone]  Wilhemina Bonito. Marina Goodell, MD [address] [place] [address]  Electronically Signed by Luretha Murphy MD on 12/23/2010 04:30:34 PM

## 2011-02-01 HISTORY — PX: RETINAL LASER PROCEDURE: SHX2339

## 2011-02-17 ENCOUNTER — Encounter (INDEPENDENT_AMBULATORY_CARE_PROVIDER_SITE_OTHER): Payer: Self-pay | Admitting: General Surgery

## 2011-02-18 ENCOUNTER — Ambulatory Visit (INDEPENDENT_AMBULATORY_CARE_PROVIDER_SITE_OTHER): Payer: BC Managed Care – PPO | Admitting: Surgery

## 2011-02-18 ENCOUNTER — Encounter (INDEPENDENT_AMBULATORY_CARE_PROVIDER_SITE_OTHER): Payer: Self-pay | Admitting: Surgery

## 2011-02-18 VITALS — Temp 97.9°F | Wt 149.0 lb

## 2011-02-18 DIAGNOSIS — Z9889 Other specified postprocedural states: Secondary | ICD-10-CM

## 2011-02-18 DIAGNOSIS — Z09 Encounter for follow-up examination after completed treatment for conditions other than malignant neoplasm: Secondary | ICD-10-CM

## 2011-02-18 NOTE — Progress Notes (Signed)
Vanessa Oliver returns today after her laparoscopic Nissen fundoplication done over a #56 dilator on May 16. She looks great. She has lost weight. She sleeps better at night. She is able to tolerate foods very well and is not having any GER.  Am very pleased at how she is done. I will see her again in routine followup in the office in 2 months.

## 2011-06-18 ENCOUNTER — Ambulatory Visit (INDEPENDENT_AMBULATORY_CARE_PROVIDER_SITE_OTHER): Payer: BC Managed Care – PPO | Admitting: Surgery

## 2011-06-18 ENCOUNTER — Encounter (INDEPENDENT_AMBULATORY_CARE_PROVIDER_SITE_OTHER): Payer: Self-pay | Admitting: Surgery

## 2011-06-18 VITALS — BP 106/70 | HR 56 | Temp 97.8°F | Resp 16 | Ht 64.5 in | Wt 147.4 lb

## 2011-06-18 DIAGNOSIS — Z09 Encounter for follow-up examination after completed treatment for conditions other than malignant neoplasm: Secondary | ICD-10-CM

## 2011-06-18 DIAGNOSIS — Z9889 Other specified postprocedural states: Secondary | ICD-10-CM | POA: Insufficient documentation

## 2011-06-18 NOTE — Progress Notes (Signed)
Vanessa Oliver 60 y.o.  Body mass index is 24.91 kg/(m^2).  Patient Active Problem List  Diagnoses  . DEPRESSION/ANXIETY  . ESOPHAGEAL STRICTURE  . GERD  . IBS  . FIBROMYALGIA  . VASOVAGAL SYNCOPE  . DYSPHAGIA  . ABDOMINAL PAIN-RLQ  . S/P Nissen fundoplication May 2012    Allergies  Allergen Reactions  . Cefuroxime Axetil   . Glucosamine   . Hydrocodone-Acetaminophen     REACTION: rash  . Ketorolac Tromethamine   . Metronidazole   . Penicillins     Past Surgical History  Procedure Date  . Nissen fundoplication   . Eye surgery july 27th    right eye    Angelica Chessman., MD, MD 1. S/P Nissen fundoplication May 2012     The patient looks good. She is having occasional dysphagia. She describes some regurg at times when she eats rapidly or does not chew her food. I wonder if she's having some issues with her peptic stricture. I will go ahead and get an upper GI series to look at her wrap and  her esophagus.  Return after UGI.  Matt B. Daphine Deutscher, MD, [facility] [phone] beeper [phone]  06/18/2011 9:39 AM

## 2011-06-18 NOTE — Patient Instructions (Signed)
Thoroughly chew your food and avoid liquids with eating. See me after you have had the upper GI series.

## 2011-07-02 ENCOUNTER — Other Ambulatory Visit (HOSPITAL_COMMUNITY): Payer: BC Managed Care – PPO

## 2011-07-08 ENCOUNTER — Ambulatory Visit (HOSPITAL_COMMUNITY)
Admission: RE | Admit: 2011-07-08 | Discharge: 2011-07-08 | Disposition: A | Discharge status: Home / Self Care | Payer: BC Managed Care – PPO | Source: Ambulatory Visit | Attending: Surgery | Admitting: Surgery

## 2011-07-08 DIAGNOSIS — Z9889 Other specified postprocedural states: Secondary | ICD-10-CM

## 2011-07-08 DIAGNOSIS — Z09 Encounter for follow-up examination after completed treatment for conditions other than malignant neoplasm: Secondary | ICD-10-CM | POA: Insufficient documentation

## 2011-07-08 DIAGNOSIS — R1310 Dysphagia, unspecified: Secondary | ICD-10-CM | POA: Insufficient documentation

## 2011-07-13 HISTORY — PX: CATARACT EXTRACTION: SUR2

## 2011-07-28 ENCOUNTER — Encounter (INDEPENDENT_AMBULATORY_CARE_PROVIDER_SITE_OTHER): Payer: Self-pay | Admitting: Surgery

## 2011-07-28 ENCOUNTER — Ambulatory Visit (INDEPENDENT_AMBULATORY_CARE_PROVIDER_SITE_OTHER): Payer: BC Managed Care – PPO | Admitting: Surgery

## 2011-07-28 VITALS — BP 118/80 | HR 80 | Temp 98.6°F | Resp 12 | Ht 64.0 in | Wt 147.6 lb

## 2011-07-28 DIAGNOSIS — Z9889 Other specified postprocedural states: Secondary | ICD-10-CM

## 2011-07-28 DIAGNOSIS — Z09 Encounter for follow-up examination after completed treatment for conditions other than malignant neoplasm: Secondary | ICD-10-CM

## 2011-07-28 NOTE — Progress Notes (Signed)
Vanessa Oliver 61 y.o.  Body mass index is 25.34 kg/(m^2).  Patient Active Problem List  Diagnoses  . DEPRESSION/ANXIETY  . ESOPHAGEAL STRICTURE  . GERD  . IBS  . FIBROMYALGIA  . VASOVAGAL SYNCOPE  . DYSPHAGIA  . ABDOMINAL PAIN-RLQ  . S/P Nissen fundoplication May 2012    Allergies  Allergen Reactions  . Penicillins   . Cataflam (Diclofenac Potassium)   . Cefuroxime Axetil   . Chlorzoxazone   . Clarithromycin   . Flagyl (Metronidazole Hcl)   . Glucosamine   . Guaifenesin & Derivatives   . Hydrocodone-Acetaminophen     REACTION: rash  . Ketorolac Tromethamine   . Metronidazole   . Oxycodone   . Pentazocine   . Smz-Tmp Ds (Sulfamethoxazole W/Trimethoprim (Co-Trimoxazole))   . Tramadol   . Trazodone And Nefazodone     Past Surgical History  Procedure Date  . Nissen fundoplication   . Eye surgery july 27th    right eye   . Tonsillectomy   . Nasal septum surgery   . Knee arthroscopy     right  . Foot surgery     mortans neuroma, left  . Carpal tunnel release     bilateral  . Trigger finger release     bilateral  . Abdominal hysterectomy    Angelica Chessman., MD, MD No diagnosis found.  The patient looks good. She is having occasional dysphagia. She describes some regurg at times when she eats rapidly or does not chew her food. I wonder if she's having some issues with her peptic stricture. I will go ahead and get an upper GI series to look at her wrap and  her esophagus.  Return after UGI.  Matt B. Daphine Deutscher, MD, [facility] [phone] beeper [phone]  07/28/2011 4:00 PM  She is doing very well now.  "This is the best surgery that I've ever had done."  Inquired for her overweight husband who has DM II REturn prn

## 2012-04-27 ENCOUNTER — Other Ambulatory Visit: Payer: Self-pay | Admitting: Dermatology

## 2012-10-10 ENCOUNTER — Telehealth (INDEPENDENT_AMBULATORY_CARE_PROVIDER_SITE_OTHER): Payer: Self-pay

## 2012-10-10 ENCOUNTER — Other Ambulatory Visit (INDEPENDENT_AMBULATORY_CARE_PROVIDER_SITE_OTHER): Payer: Self-pay

## 2012-10-10 ENCOUNTER — Encounter (INDEPENDENT_AMBULATORY_CARE_PROVIDER_SITE_OTHER): Payer: Self-pay

## 2012-10-10 DIAGNOSIS — K219 Gastro-esophageal reflux disease without esophagitis: Secondary | ICD-10-CM

## 2012-10-10 NOTE — Telephone Encounter (Signed)
Returned pt's call regarding her symptoms.  Pt states she is having the same symptoms as she was experiencing prior to Sx. (GERD, burping, gas,...)  I let the pt know that I MM is out of the office until next Thursday, but that I would speak with one of the other partners about her symptoms.

## 2012-10-10 NOTE — Telephone Encounter (Signed)
LMOM letting the pt know that I have spoke with Dr. Abbey Chatters about her case.  Per TR I ordered a UGI and scheduled the pt to see MM.  UGI appt - 4/11 @ 830/arrive @ 815.  NPO /p midnight MM appt - 4/24 @ 950a  All information was left on VM

## 2012-10-20 ENCOUNTER — Ambulatory Visit
Admission: RE | Admit: 2012-10-20 | Discharge: 2012-10-20 | Disposition: A | Discharge status: Home / Self Care | Source: Ambulatory Visit | Attending: General Surgery | Admitting: General Surgery

## 2012-10-20 DIAGNOSIS — K219 Gastro-esophageal reflux disease without esophagitis: Secondary | ICD-10-CM

## 2012-11-02 ENCOUNTER — Ambulatory Visit (INDEPENDENT_AMBULATORY_CARE_PROVIDER_SITE_OTHER): Payer: BC Managed Care – PPO | Admitting: Surgery

## 2012-11-02 VITALS — BP 154/78 | HR 68 | Temp 97.2°F | Resp 18 | Ht 64.0 in | Wt 160.0 lb

## 2012-11-02 DIAGNOSIS — K219 Gastro-esophageal reflux disease without esophagitis: Secondary | ICD-10-CM

## 2012-11-02 DIAGNOSIS — Z9889 Other specified postprocedural states: Secondary | ICD-10-CM

## 2012-11-02 NOTE — Patient Instructions (Signed)
Wrap is intact.   Try to lose 5-10 lbs.

## 2012-11-02 NOTE — Progress Notes (Signed)
Vanessa Oliver 62 y.o.  Body mass index is 27.45 kg/(m^2).  Patient Active Problem List  Diagnosis  . DEPRESSION/ANXIETY  . ESOPHAGEAL STRICTURE  . GERD  . IBS  . FIBROMYALGIA  . VASOVAGAL SYNCOPE  . DYSPHAGIA  . ABDOMINAL PAIN-RLQ  . S/P Nissen fundoplication May 2012    Allergies  Allergen Reactions  . Penicillins   . Cataflam (Diclofenac Potassium)   . Cefuroxime Axetil   . Chlorzoxazone   . Clarithromycin   . Flagyl (Metronidazole Hcl)   . Glucosamine   . Guaifenesin & Derivatives   . Hydrocodone-Acetaminophen     REACTION: rash  . Ketorolac Tromethamine   . Metronidazole   . Oxycodone   . Pentazocine   . Smz-Tmp Ds (Sulfamethoxazole W/Trimethoprim (Co-Trimoxazole))   . Tramadol   . Trazodone And Nefazodone     Past Surgical History  Procedure Laterality Date  . Nissen fundoplication    . Eye surgery  july 27th    right eye   . Tonsillectomy    . Nasal septum surgery    . Knee arthroscopy      right  . Foot surgery      mortans neuroma, left  . Carpal tunnel release      bilateral  . Trigger finger release      bilateral  . Abdominal hysterectomy     Angelica Chessman., MD No diagnosis found.  I reviewed her upper GI. I think that her wrap is intact and didn't necessarily appreciate any herniation of stomach above the wrap. I reviewed Dr. Ty Hilts report. She has gained about 10 pounds and that may be contributing to her reflux. I will see her again in 4 months to l see how  she's doing. For now she is not taking any PPIs.   Matt B. Daphine Deutscher, MD, [facility] [phone] beeper [phone]  11/02/2012 11:13 AM

## 2013-02-14 ENCOUNTER — Ambulatory Visit (INDEPENDENT_AMBULATORY_CARE_PROVIDER_SITE_OTHER): Payer: BC Managed Care – PPO | Admitting: Surgery

## 2013-04-10 ENCOUNTER — Encounter (INDEPENDENT_AMBULATORY_CARE_PROVIDER_SITE_OTHER): Payer: Self-pay

## 2013-05-29 ENCOUNTER — Other Ambulatory Visit: Payer: Self-pay | Admitting: Dermatology

## 2013-07-12 HISTORY — PX: CARPOMETACARPAL (CMC) FUSION OF THUMB: SHX6290

## 2014-01-09 ENCOUNTER — Encounter: Payer: Self-pay | Admitting: Internal Medicine

## 2014-01-10 ENCOUNTER — Telehealth: Payer: Self-pay | Admitting: Internal Medicine

## 2014-01-10 MED ORDER — DEXLANSOPRAZOLE 60 MG PO CPDR: 60.0000 mg | DELAYED_RELEASE_CAPSULE | Freq: Every day | ORAL | Status: DC

## 2014-01-10 NOTE — Telephone Encounter (Signed)
Pt has new pt appt scheduled to see Dr. Henrene Pastor in September but would like something called in for her Reflux prior to visit. Pt states she is currently taking omeprazole 20mg  daily but she is still having problems with burning liquid coming up in her throat at night. State the acid feels like it is burning her tongue also. States this does not happen every night, also states she is under a lot of stress with her job. Dr. Henrene Pastor please advise.

## 2014-01-10 NOTE — Telephone Encounter (Signed)
Pt aware and script sent to pharmacy. 

## 2014-01-10 NOTE — Telephone Encounter (Signed)
Dexilant 60 mg daily 

## 2014-03-22 DIAGNOSIS — E785 Hyperlipidemia, unspecified: Secondary | ICD-10-CM | POA: Insufficient documentation

## 2014-03-25 ENCOUNTER — Encounter: Payer: Self-pay | Admitting: Internal Medicine

## 2014-03-25 ENCOUNTER — Ambulatory Visit (INDEPENDENT_AMBULATORY_CARE_PROVIDER_SITE_OTHER): Payer: BC Managed Care – PPO | Admitting: Internal Medicine

## 2014-03-25 VITALS — BP 110/64 | HR 80 | Ht 63.75 in | Wt 161.1 lb

## 2014-03-25 DIAGNOSIS — K219 Gastro-esophageal reflux disease without esophagitis: Secondary | ICD-10-CM

## 2014-03-25 DIAGNOSIS — K5730 Diverticulosis of large intestine without perforation or abscess without bleeding: Secondary | ICD-10-CM

## 2014-03-25 DIAGNOSIS — K222 Esophageal obstruction: Secondary | ICD-10-CM

## 2014-03-25 DIAGNOSIS — R1310 Dysphagia, unspecified: Secondary | ICD-10-CM

## 2014-03-25 MED ORDER — OMEPRAZOLE 40 MG PO CPDR
40.0000 mg | DELAYED_RELEASE_CAPSULE | Freq: Two times a day (BID) | ORAL | Status: DC
Start: 2014-03-25 — End: 2015-05-19

## 2014-03-25 NOTE — Patient Instructions (Signed)
We have sent the following medications to your pharmacy for you to pick up at your convenience:  Omeprazole  

## 2014-03-25 NOTE — Progress Notes (Signed)
HISTORY OF PRESENT ILLNESS:  Vanessa Oliver is a 63 y.o. female  with past medical history as listed below. She has a history of GERD status post Nissen fundoplication 3267. Last evaluated in this office March 2011 for worsening GERD. See that dictation. She was changed from omeprazole 20 mg daily to Dexilant 60 mg daily. Dexilant worked but was discontinued secondary to cost. Subsequently, Was taking omeprazole 20 mg daily with significant reflux symptoms. Now taking 60 mg of omeprazole over-the-counter with improvement in symptoms. She also reports occasional dysphagia to pills only. She has had weight gain. No lower GI complaints aside from her known IBS   REVIEW OF SYSTEMS:  All non-GI ROS negative except for sinus and allergy, arthritis, back pain, visual change, fatigue, sleeping problems, urinary leakage  Past Medical History  Diagnosis Date  . Wears glasses   . Sinus complaint   . Arthritis   . Hyperlipidemia   . Hiatal hernia   . Schatzki's ring   . GERD (gastroesophageal reflux disease)   . Fibromyalgia   . Anxiety and depression   . IBS (irritable bowel syndrome)   . Fundic gland polyps of stomach, benign   . Diverticulosis   . Esophageal stricture   . Plantar fasciitis   . Rosacea   . TMJ (temporomandibular joint disorder)   . Vasovagal syncope   . Mononucleosis 1975    Past Surgical History  Procedure Laterality Date  . Nissen fundoplication  1245  . Retinal laser procedure Right 02/01/2011    right eye   . Tonsillectomy  1981  . Nasal septum surgery  1970  . Knee arthroscopy Right 1994    x 3  . 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 Carpal tunnel release Bilateral 2004  . Trigger finger release Bilateral 2005  . Abdominal hysterectomy  1997  . Varicose vein surgery Bilateral 2007, 2009    left 2007, right 2009  . Mallet finger Right 2011  . Vitrectomy Right 2012  . Cataract extraction Right 2013  . Carpometacarpal (cmc) fusion of thumb   2015    Social History Vanessa Oliver  reports that she has never smoked. She has never used smokeless tobacco. She reports that she drinks alcohol. She reports that she does not use illicit drugs.  family history includes Breast cancer in her mother; Celiac disease in her brother; Lung cancer in her father; Pancreatic cancer in her father; Skin cancer in her father.  Allergies  Allergen Reactions  . Penicillins   . Cataflam [Diclofenac Potassium]   . Cefuroxime Axetil   . Chlorzoxazone   . Clarithromycin   . Flagyl [Metronidazole Hcl]   . Glucosamine   . Guaifenesin & Derivatives   . Hydrocodone-Acetaminophen     REACTION: rash  . Ketorolac Tromethamine   . Metronidazole   . Oxycodone   . Pentazocine   . Smz-Tmp Ds [Sulfamethoxazole W/Trimethoprim (Co-Trimoxazole)]   . Tramadol   . Trazodone And Nefazodone   . Vicodin [Hydrocodone-Acetaminophen]        PHYSICAL EXAMINATION: Vital signs: BP 110/64  Pulse 80  Ht 5' 3.75" (1.619 m)  Wt 161 lb 2 oz (73.086 kg)  BMI 27.88 kg/m2 General: Well-developed, well-nourished, no acute distress HEENT: Sclerae are anicteric, conjunctiva pink. Oral mucosa intact Lungs: Clear Heart: Regular Abdomen: soft, nontender, nondistended, no obvious ascites, no peritoneal signs, normal bowel sounds. No organomegaly. Extremities: No edema Psychiatric: alert and oriented x3. Cooperative  ASSESSMENT:  #1. GERD with known peptic stricture. Last EGD August 2009 with [facility] dilation. Subsequent Nissen fundoplication 1275. Thereafter developed recurrent reflux symptoms requiring PPI. Requiring the colon to 60 mg of omeprazole daily for symptom relief. #2. Mild pill dysphagia. History of known peptic stricture. #3. Screening colonoscopy August 2009 with sigmoid diverticulosis   PLAN:  #1. Reflux precautions with attention to weight loss #2. Prescribe omeprazole 40 mg twice a day #3. Contact the office if dysphagia worsens. We could  consider repeat endoscopy with esophageal dilation #4. Screening colonoscopy around August 2019 #5. Interval GI followup as needed

## 2014-03-30 LAB — HM MAMMOGRAPHY: HM MAMMO: NORMAL

## 2014-07-16 ENCOUNTER — Ambulatory Visit (INDEPENDENT_AMBULATORY_CARE_PROVIDER_SITE_OTHER): Payer: Medicare Other | Admitting: Family

## 2014-07-16 ENCOUNTER — Encounter: Payer: Self-pay | Admitting: Family

## 2014-07-16 VITALS — BP 114/78 | HR 75 | Temp 97.8°F | Resp 20 | Ht 64.0 in | Wt 166.0 lb

## 2014-07-16 DIAGNOSIS — M797 Fibromyalgia: Secondary | ICD-10-CM

## 2014-07-16 DIAGNOSIS — K219 Gastro-esophageal reflux disease without esophagitis: Secondary | ICD-10-CM | POA: Diagnosis not present

## 2014-07-16 DIAGNOSIS — S030XXA Dislocation of jaw, initial encounter: Secondary | ICD-10-CM | POA: Diagnosis not present

## 2014-07-16 DIAGNOSIS — K589 Irritable bowel syndrome without diarrhea: Secondary | ICD-10-CM | POA: Diagnosis not present

## 2014-07-16 DIAGNOSIS — G4733 Obstructive sleep apnea (adult) (pediatric): Secondary | ICD-10-CM | POA: Insufficient documentation

## 2014-07-16 DIAGNOSIS — S0300XA Dislocation of jaw, unspecified side, initial encounter: Secondary | ICD-10-CM

## 2014-07-16 DIAGNOSIS — G43909 Migraine, unspecified, not intractable, without status migrainosus: Secondary | ICD-10-CM | POA: Diagnosis not present

## 2014-07-16 DIAGNOSIS — R55 Syncope and collapse: Secondary | ICD-10-CM

## 2014-07-16 DIAGNOSIS — F341 Dysthymic disorder: Secondary | ICD-10-CM

## 2014-07-16 DIAGNOSIS — R0683 Snoring: Secondary | ICD-10-CM | POA: Diagnosis not present

## 2014-07-16 NOTE — Assessment & Plan Note (Signed)
Chronic pain despite cymbalta, hopefully will improve after her suspected OSA is treated.

## 2014-07-16 NOTE — Assessment & Plan Note (Signed)
Only has had one episode of syncope in her 70's.

## 2014-07-16 NOTE — Assessment & Plan Note (Signed)
Stable on cymbalta. Continue same.  

## 2014-07-16 NOTE — Assessment & Plan Note (Signed)
I suspect these will improve with treatment of OSA as well.

## 2014-07-16 NOTE — Assessment & Plan Note (Signed)
Strong suspicion for sleep apnea. She scored 16 on Epsworth sleepiness scale. Refer for split night study.

## 2014-07-16 NOTE — Patient Instructions (Signed)
You will be contacted about your referral for sleep study. Please schedule a wellness visit at the front desk. Welcome to [facility]!

## 2014-07-16 NOTE — Assessment & Plan Note (Signed)
Wears night guard.

## 2014-07-16 NOTE — Progress Notes (Signed)
Subjective:    Patient ID: Vanessa Oliver, female    DOB: 1951/01/22, 64 y.o.   MRN: [id number]  HPI   Ms. Spearing is a 64 yr old female who presents today to establish care. She has followed with Dr. Shari Heritage.  Pmhx is significant for the following:  1) Anxiety/depression- on Cymbalta. Reports that she developed panic attacks.  Saw therapist.  No recent panic attacks.  Depression she attributes to exhaustion.   2) GERD/Esophageal Stricture- s/p nissen fundoplication. On Omeprazole, reports that she has gained 20 pounds. She sees Dr. Scarlette Shorts. Sleeps on a wedge  3) Vasovagal syncope- reports that if she does not get enough fluids in the AM, she gets "tingling." Has only passed out one time.   4) IBS- reports that she has soft stools. Has better in the last 1 year.   5)  Witnessed apneas during sleep- Reports hx of loud snoring.  Has never had a sleep study.  Reports that she has a deviated septum which was repaired at age 7, though she still can't breath through her right nare.  + witness apnea by her husband.    6) Fibromyalgia- on cymbalta.    7) TMJ- wears a mouth  Piece.  8) Migraines-  Reports that she has them on the weekends or if there is a change in the weather. Usually relived by a coke and 2 aleve. Review of Systems  Constitutional:       20 pound weight gain in last 18 months- not exercising, drinking too may cokes.   HENT: Negative for hearing loss and rhinorrhea.   Eyes: Negative for visual disturbance.  Respiratory: Negative for cough.   Cardiovascular:       Reports some leg swelling R>L.  Hx of vein stripping.   Gastrointestinal:       See hpi  Genitourinary: Negative for dysuria and frequency.  Musculoskeletal: Positive for myalgias.  Skin:       Sees derm for rash on chest/back  Neurological: Positive for headaches.  Psychiatric/Behavioral:       See HPI   Past Medical History  Diagnosis Date  . Wears glasses   . Sinus complaint   . Arthritis     . Hyperlipidemia   . Hiatal hernia   . Schatzki's ring   . GERD (gastroesophageal reflux disease)   . Fibromyalgia   . Anxiety and depression   . IBS (irritable bowel syndrome)   . Fundic gland polyps of stomach, benign   . Diverticulosis   . Esophageal stricture   . Plantar fasciitis   . Rosacea   . TMJ (temporomandibular joint disorder)   . Vasovagal syncope   . Mononucleosis 1975  . Depression   . Allergy   . History of mononucleosis 1975  . Fibromyalgia     History   Social History  . Marital Status: Married    Spouse Name: N/A    Number of Children: 0  . Years of Education: N/A   Occupational History  . clerical    Social History Main Topics  . Smoking status: Never Smoker   . Smokeless tobacco: Never Used  . Alcohol Use: Yes     Comment: ocassional  . Drug Use: No  . Sexual Activity: Not on file   Other Topics Concern  . Not on file   Social History Narrative   Works as an Glass blower/designer   Married- husband is 66 years older than her   Former Nature conservation officer  Grew up up [place]   Has cats   Enjoys reading   No children    Past Surgical History  Procedure Laterality Date  . Nissen fundoplication  1696  . Retinal laser procedure Right 02/01/2011    right eye   . Tonsillectomy  1981  . Nasal septum surgery  1970  . Knee arthroscopy Right 1994    x 3  . 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 Carpal tunnel release Bilateral 2004  . Trigger finger release Bilateral 2005  . Abdominal hysterectomy  1997  . Varicose vein surgery Bilateral 2007, 2009    left 2007, right 2009  . Mallet finger Right 2011  . Vitrectomy Right 2012  . Cataract extraction Right 2013    Dr Ellison Hughs  . Carpometacarpal (cmc) fusion of thumb  2015    thumb    Family History  Problem Relation Age of Onset  . Breast cancer Mother   . Arthritis Mother   . Hyperlipidemia Mother   . Hypertension Mother   . Lung cancer Father   . Pancreatic cancer Father   . Skin  cancer Father   . Arthritis Father   . Celiac disease Brother   . Hyperlipidemia Maternal Grandfather   . Heart disease Maternal Grandfather   . Stroke Maternal Grandfather     Allergies  Allergen Reactions  . Penicillins   . Cataflam [Diclofenac Potassium]   . Cefuroxime Axetil   . Chlorzoxazone   . Clarithromycin   . Flagyl [Metronidazole Hcl]   . Glucosamine   . Guaifenesin & Derivatives   . Hydrocodone-Acetaminophen     REACTION: rash  . Ketorolac Tromethamine   . Metronidazole   . Oxycodone   . Pentazocine   . Smz-Tmp Ds [Sulfamethoxazole W/Trimethoprim (Co-Trimoxazole)]   . Tramadol   . Trazodone And Nefazodone   . Vicodin [Hydrocodone-Acetaminophen]     Current Outpatient Prescriptions on File Prior to Visit  Medication Sig Dispense Refill  . aspirin 81 MG tablet Take 81 mg by mouth daily.    . Azelaic Acid (FINACEA) 15 % cream Apply topically 1 day or 1 dose. After skin is thoroughly washed and patted dry, gently but thoroughly massage a thin film of azelaic acid cream into the affected area twice daily, in the morning and evening.     . calcium carbonate 200 MG capsule Take 2 capsules by mouth 2 (two) times daily with a meal.     . cholecalciferol (VITAMIN D) 1000 UNITS tablet Take 1,000 Units by mouth 2 (two) times daily.    . clindamycin (CLINDAGEL) 1 % gel Apply topically as needed.    . DULoxetine (CYMBALTA) 20 MG capsule Take 20 mg by mouth daily.    Marland Kitchen loratadine (CLARITIN) 10 MG tablet Take 10 mg by mouth daily.    . mometasone (NASONEX) 50 MCG/ACT nasal spray Place 2 sprays into the nose daily.    . montelukast (SINGULAIR) 10 MG tablet Take 10 mg by mouth at bedtime.    . Multiple Vitamin (MULTIVITAMIN) tablet Take 1 tablet by mouth daily.    Marland Kitchen omeprazole (PRILOSEC) 40 MG capsule Take 1 capsule (40 mg total) by mouth 2 (two) times daily. 180 capsule 3  . Pitavastatin Calcium (LIVALO) 2 MG TABS Take 2 mg by mouth.     . Probiotic Product (PROBIOTIC DAILY PO)  Take 1 tablet by mouth daily.    Marland Kitchen triamcinolone (KENALOG) 0.1 % cream Apply topically 1 day  or 1 dose.      . vitamin C (ASCORBIC ACID) 500 MG tablet Take 500 mg by mouth daily.    . VOLTAREN 1 % GEL      No current facility-administered medications on file prior to visit.    BP 114/78 mmHg  Pulse 75  Temp(Src) 97.8 F (36.6 C) (Oral)  Resp 20  Ht 5\' 4"  (1.626 m)  Wt 166 lb (75.297 kg)  BMI 28.48 kg/m2  SpO2 98%  LMP 05/14/1996       Objective:   Physical Exam  Constitutional: She is oriented to person, place, and time. She appears well-developed and well-nourished. No distress.  HENT:  Head: Normocephalic and atraumatic.  Right Ear: Tympanic membrane and ear canal normal.  Left Ear: Tympanic membrane and ear canal normal.  Mouth/Throat: No oropharyngeal exudate, posterior oropharyngeal edema or posterior oropharyngeal erythema.  Eyes: No scleral icterus.  Cardiovascular: Normal rate and regular rhythm.   No murmur heard. Pulmonary/Chest: Effort normal and breath sounds normal. No respiratory distress. She has no wheezes. She has no rales. She exhibits no tenderness.  Abdominal: Soft. She exhibits no distension. There is no tenderness.  Musculoskeletal:  1-2+ RLE swelling, 1+ LLE swelling  Lymphadenopathy:    She has no cervical adenopathy.  Neurological: She is alert and oriented to person, place, and time.  Skin: Skin is warm and dry.  Psychiatric: She has a normal mood and affect. Her behavior is normal. Judgment and thought content normal.          Assessment & Plan:

## 2014-07-16 NOTE — Assessment & Plan Note (Signed)
Fair control. Management per GI.

## 2014-07-16 NOTE — Assessment & Plan Note (Signed)
Fair control on PPI, we discussed that weight loss will likely help her symptoms. Management per GI.

## 2014-07-19 ENCOUNTER — Encounter: Payer: Self-pay | Admitting: Family

## 2014-07-19 NOTE — Telephone Encounter (Signed)
Vanessa Oliver, can you please see if it is possible to mover her sleep study sooner? OK to change location of sleep study if necessary.

## 2014-07-23 ENCOUNTER — Ambulatory Visit (HOSPITAL_BASED_OUTPATIENT_CLINIC_OR_DEPARTMENT_OTHER): Payer: Medicare Other | Attending: Family

## 2014-07-23 DIAGNOSIS — R0683 Snoring: Secondary | ICD-10-CM | POA: Insufficient documentation

## 2014-07-23 DIAGNOSIS — G4733 Obstructive sleep apnea (adult) (pediatric): Secondary | ICD-10-CM | POA: Insufficient documentation

## 2014-07-25 ENCOUNTER — Telehealth: Payer: Self-pay | Admitting: Family

## 2014-07-25 DIAGNOSIS — G4730 Sleep apnea, unspecified: Secondary | ICD-10-CM

## 2014-07-25 DIAGNOSIS — G4733 Obstructive sleep apnea (adult) (pediatric): Secondary | ICD-10-CM

## 2014-07-25 NOTE — Telephone Encounter (Signed)
See my chart message

## 2014-07-25 NOTE — Sleep Study (Signed)
[facility]   NAME: Vanessa Oliver  DATE OF BIRTH: Mar 17, 1951  MEDICAL RECORD [id number]  LOCATION: [facility]   PHYSICIAN: ALVA,RAKESH V.   DATE OF STUDY: 07/23/14   SLEEP STUDY TYPE: Nocturnal Polysomnogram   REFERRING PHYSICIAN: Rigoberto Noel, MD   INDICATION FOR STUDY:  64 year old with migraines depression, loud snoring and witnessed apneas. She had a deviated septum that was repaired at age 61. She wears a mouth piece for pain in her temporomandibular joint. At the time of this study ,they weighed 163 pounds with a height of 5 ft 3 inches and the BMI of 29, neck size of 14 inches. Epworth sleepiness score was 15   This nocturnal polysomnogram was performed with a sleep technologist in attendance. EEG, EOG,EMG and respiratory parameters recorded. Sleep stages, arousals, limb movements and respiratory data was scored according to criteria laid out by the American Academy of sleep medicine.   SLEEP ARCHITECTURE: Lights out was at 2148 PM and lights on was at 456 AM. Total sleep time was 277 minutes with a sleep period time of 385 minutes and a sleep efficiency of 65 %. Sleep latency was 40 minutes with latency to REM sleep of 277 minutes and wake after sleep onset of 112 minutes. . Sleep stages as a percentage of total sleep time was N1 -7 %,N2- 67 % and REM sleep 14 % ( 39 minutes) . The longest period of REM sleep was around 4:30 AM.   AROUSAL DATA : There were 28  arousals with an arousal index of 6 events per hour. Most of these were spontaneous & 6 were associated with respiratory events  RESPIRATORY DATA: There were 46 obstructive apneas, 24 central apneas, 0 mixed apneas and 30 hypopneas with apnea -hypopnea index of 22 events per hour. There were 6 RERAs with an RDI of 23  events per hour. Supine sleep was noted. Respiratory events were worse during supine and REM sleep.  MOVEMENT/PARASOMNIA: There were 27 PLMS with a PLM index  of 6 events per hour. The PLM arousal index was 0.2 per hour.  OXYGEN DATA: The lowest desaturation was 83 % during REM sleep and the desaturation index was 17 per hour.   CARDIAC DATA: The low heart rate was 38 beats per minute. The high heart rate recorded was an artifact. No arrhythmias were noted   DISCUSSION -Loud snoring was noted . She did not meet criteria for split intervention. She was desensitized with a small fullface mask  IMPRESSION :  1. Moderate obstructive sleep apnea with hypopneas causing sleep fragmentation and moderate oxygen desaturation. Central apneas were also noted. Events were worse during supine and REM sleep 2. No evidence of cardiac arrhythmias or behavioral disturbance during sleep.  3. Sleep efficiency was decreased 4. Periodic limb movements were noted but these were not related with arousals, significance unclear.  RECOMMENDATION:  1. Treatment options for this degree of sleep disordered breathing include weight loss and/or CPAP therapy. Due to the presence of central apneas, CPap titration study would be recommended 2. Patient should be cautioned against driving when sleepy  3. They should be asked to avoid medications with sedative side effects    Rigoberto Noel MD Diplomate, American Board of Sleep Medicine    ELECTRONICALLY SIGNED ON: 07/25/2014  [facility]  PH: [phone] FX: [phone]  [place]

## 2014-07-30 ENCOUNTER — Encounter: Payer: Self-pay | Admitting: Family

## 2014-07-30 ENCOUNTER — Ambulatory Visit (HOSPITAL_BASED_OUTPATIENT_CLINIC_OR_DEPARTMENT_OTHER): Payer: Medicare Other | Attending: Family | Admitting: Radiology

## 2014-07-30 VITALS — Ht 64.0 in | Wt 163.0 lb

## 2014-07-30 DIAGNOSIS — G4733 Obstructive sleep apnea (adult) (pediatric): Secondary | ICD-10-CM | POA: Diagnosis not present

## 2014-08-01 ENCOUNTER — Encounter: Payer: Self-pay | Admitting: Family

## 2014-08-05 ENCOUNTER — Telehealth: Payer: Self-pay | Admitting: Family

## 2014-08-05 DIAGNOSIS — G4730 Sleep apnea, unspecified: Secondary | ICD-10-CM | POA: Diagnosis not present

## 2014-08-05 DIAGNOSIS — G4733 Obstructive sleep apnea (adult) (pediatric): Secondary | ICD-10-CM

## 2014-08-05 NOTE — Sleep Study (Signed)
[place]  NAME: Vanessa Oliver  DATE OF BIRTH: 11-May-1951  MEDICAL RECORD NUMBER [id number]  LOCATION: [facility]  PHYSICIAN: Yuta Cipollone V.  DATE OF STUDY:   SLEEP STUDY TYPE: CPAP titration study               REFERRING PHYSICIAN: Rigoberto Noel, MD  INDICATION FOR STUDY: 64 y.o with moderate OSA- AHI 22/h At the time of this study ,they weighed 163 pounds with a height of  5 ft 4 inches and the BMI of 28, neck size of 14 inches. Epworth sleepiness score was 15   This CPAP titration polysomnogram was performed with a sleep technologist in attendance. EEG, EOG,EMG and respiratory parameters recorded. Sleep stages, arousals, limb movements and respiratory data was scored according to criteria laid out by the American Academy of sleep medicine.  SLEEP ARCHITECTURE: Lights out was at 2142 PM and lights on was at 0522 AM. Total sleep time was 402 minutes with sleep period time of 440 minutes and sleep efficiency of 87. .Sleep latency was 19 minutes with latency to REM sleep of 309 minutes and wake after sleep onset of 38 minutes.  Sleep stages as a percentage of total sleep time was N1 -5%,N2- 65% and REM sleep 17% ( 67 minutes) . The longest period of REM sleep was around 3 AM.   AROUSAL DATA : There were 56 arousals with an arousal index of 8 events per hour. Of these 45 were spontaneous, and 9 were associated with respiratory events and 2 were associated periodic limb movements  RESPIRATORY DATA: CPAP was initiated at 5 centimeters and titrated to a final level of 10 centimeters due to respiratory events and snoring. At the final level of 10 centimeters, there were 0 obstructive apneas, 3 central apneas, 0 mixed apneas and 0 hypopneas with apnea -hypopnea index of 1 events per hour.  There was no relation to sleep stage or body position. Titration was optimal.  MOVEMENT/PARASOMNIA: There were 143 PLMS with a PLM index of 21 events per hour.  The PLM arousal index was 0.3 events per hour.  OXYGEN DATA: The lowest desaturation was 88% during non-REM sleep and the desaturation index was 3 per hour. The saturations stayed below 88% for 0 minutes.  CARDIAC DATA: The low heart rate was 32 beats per minute. The high heart rate recorded was an artifact. No arrhythmias were noted   IMPRESSION :  1. Moderate obstructive sleep apnea with hypopneas causing sleep fragmentation and mild oxygen desaturation. 2. This was corrected by CPAP of 10 centimeters with a small FF mask. Titration was optimal. 3. No evidence of cardiac arrhythmias or behavioral disturbance during sleep. 4. Periodic limb movements were noted but not associated with arousals  RECOMMENDATION:    1. The treatment options for this degree of sleep disordered breathing includes weight loss and/or CPAP therapy. CPAP can be initiated at 10 centimeters with a small FF mask and compliance monitored at this level. 2. Patient should be cautioned against driving when sleepy 3. They should be asked to avoid medications with sedative side effects  Rigoberto Noel  MD Diplomate, American Board of Sleep Medicine  ELECTRONICALLY SIGNED ON:  08/05/2014  [facility] PH: [phone]   FX: [phone] [place]

## 2014-08-05 NOTE — Telephone Encounter (Signed)
See my chart message

## 2014-08-12 ENCOUNTER — Encounter: Payer: Self-pay | Admitting: Family

## 2014-08-12 ENCOUNTER — Ambulatory Visit (INDEPENDENT_AMBULATORY_CARE_PROVIDER_SITE_OTHER): Payer: Medicare Other | Admitting: Family

## 2014-08-12 VITALS — BP 90/62 | HR 74 | Temp 97.8°F | Resp 16 | Ht 64.0 in | Wt 164.8 lb

## 2014-08-12 DIAGNOSIS — R1310 Dysphagia, unspecified: Secondary | ICD-10-CM | POA: Diagnosis not present

## 2014-08-12 DIAGNOSIS — K219 Gastro-esophageal reflux disease without esophagitis: Secondary | ICD-10-CM | POA: Diagnosis not present

## 2014-08-12 DIAGNOSIS — Z0000 Encounter for general adult medical examination without abnormal findings: Secondary | ICD-10-CM

## 2014-08-12 DIAGNOSIS — G4733 Obstructive sleep apnea (adult) (pediatric): Secondary | ICD-10-CM

## 2014-08-12 DIAGNOSIS — M797 Fibromyalgia: Secondary | ICD-10-CM

## 2014-08-12 DIAGNOSIS — G43909 Migraine, unspecified, not intractable, without status migrainosus: Secondary | ICD-10-CM | POA: Diagnosis not present

## 2014-08-12 DIAGNOSIS — Z13820 Encounter for screening for osteoporosis: Secondary | ICD-10-CM

## 2014-08-12 MED ORDER — CALCIUM CARBONATE-VITAMIN D 600-400 MG-UNIT PO TABS: 1.0000 | ORAL_TABLET | Freq: Two times a day (BID) | ORAL | Status: DC

## 2014-08-12 NOTE — Assessment & Plan Note (Signed)
Plan to start CPAP this week and follow up in 6 weeks.

## 2014-08-12 NOTE — Assessment & Plan Note (Signed)
Hopefully will improve after CPAP initiation.

## 2014-08-12 NOTE — Assessment & Plan Note (Signed)
Advised pt to arrange follow up with Dr. Henrene Pastor to review dysphagia and diarrhea symptoms.

## 2014-08-12 NOTE — Assessment & Plan Note (Signed)
Continue PPI. We discussed increase calcium to 600 mg bid due to potential decreased absorption in setting of PPI use. Will also obtain dexa.

## 2014-08-12 NOTE — Patient Instructions (Addendum)
Please schedule follow up with Dr. Henrene Pastor for your GI concerns.  You will be contacted about your bone density test. Follow up in 6 months.

## 2014-08-12 NOTE — Assessment & Plan Note (Signed)
Unchanged, continue cymbalta.

## 2014-08-12 NOTE — Progress Notes (Signed)
Subjective:    Vanessa Oliver is a 64 y.o. female who presents for Medicare Initial preventive examination. Patient presents today for complete physical.  Immunizations: up to date. Td-2012, zoster- 07/2009, influenza - 03/2104 Diet: healthy Exercise: 40-45 minutes a day walking Colonoscopy: 02/2008 Dexa:  Pap Smear: hysterectomy Mammogram: 03/2014  Preventive Screening-Counseling & Management  Tobacco History  Smoking status  . Never Smoker   Smokeless tobacco  . Never Used     Problems Prior to Visit 1. OSA: Sleep study 07/30/2014 on found moderate obstructive sleep apnea with hypopneas causing sleep fragmentation and mild oxygen desaturation corrected by CPAP of 10 centimeters with a small FF mask.  She reports that she will get CPAP on Feb 4.  2. Migraines: wakes up with HA often. She feels it is related to OSA. She did not have this after wearing CPAP during sleep study.   3. Fibromyalgia: Does not see pain specialist or rheumatology. Does not feel that cymbalta does much good. Feels that sleep study may improve this also.   4. Depression: reports good control of depression. Leadership changes going on at work but feels that she is doing ok despite.  5. GERD:  Sleeps on incline pillow and avoiding eating late. Working on losing weight. Walking 40-45 min per day. Fair control. Management per GI.  6. IBS:  Doing ok per patient. Reports some diarrhea with gas. Management per GI. Saw Dr. Henrene Pastor about 6 months ago.   7. Dysphagia: reports food sticking. Feels that she needs dilatation again.   BP 90/62 mmHg  Pulse 74  Temp(Src) 97.8 F (36.6 C) (Oral)  Resp 16  Ht 5\' 4"  (1.626 m)  Wt 164 lb 12.8 oz (74.753 kg)  BMI 28.27 kg/m2  SpO2 99%  LMP 05/14/1996  Current Problems (verified) Patient Active Problem List   Diagnosis Date Noted  . OSA (obstructive sleep apnea) 07/16/2014  . Migraines 07/16/2014  . TMJ (dislocation of temporomandibular joint) 07/16/2014  . S/P  Nissen fundoplication May 0102 72/53/6644  . DYSPHAGIA 01/24/2008  . ABDOMINAL PAIN-RLQ 01/24/2008  . DEPRESSION/ANXIETY 12/02/2007  . ESOPHAGEAL STRICTURE 12/02/2007  . GERD 12/02/2007  . IBS 12/02/2007  . Fibromyalgia 12/02/2007  . VASOVAGAL SYNCOPE 12/02/2007    Medications Prior to Visit Current Outpatient Prescriptions on File Prior to Visit  Medication Sig Dispense Refill  . aspirin 81 MG tablet Take 81 mg by mouth daily.    . Azelaic Acid (FINACEA) 15 % cream Apply topically 1 day or 1 dose. After skin is thoroughly washed and patted dry, gently but thoroughly massage a thin film of azelaic acid cream into the affected area twice daily, in the morning and evening.     . calcium carbonate 200 MG capsule Take 2 capsules by mouth 2 (two) times daily with a meal.     . cholecalciferol (VITAMIN D) 1000 UNITS tablet Take 1,000 Units by mouth 2 (two) times daily.    . clindamycin (CLINDAGEL) 1 % gel Apply topically as needed.    . DULoxetine (CYMBALTA) 20 MG capsule Take 20 mg by mouth daily.    Marland Kitchen FIBER PO Take 2 capsules by mouth 2 (two) times daily.    Marland Kitchen loratadine (CLARITIN) 10 MG tablet Take 10 mg by mouth daily.    . mometasone (NASONEX) 50 MCG/ACT nasal spray Place 2 sprays into the nose daily.    . montelukast (SINGULAIR) 10 MG tablet Take 10 mg by mouth at bedtime.    . Multiple Vitamin (  MULTIVITAMIN) tablet Take 1 tablet by mouth daily.    Marland Kitchen omeprazole (PRILOSEC) 40 MG capsule Take 1 capsule (40 mg total) by mouth 2 (two) times daily. 180 capsule 3  . Pitavastatin Calcium (LIVALO) 2 MG TABS Take 2 mg by mouth.     . Probiotic Product (PROBIOTIC DAILY PO) Take 1 tablet by mouth daily.    Marland Kitchen triamcinolone (KENALOG) 0.1 % cream Apply topically 1 day or 1 dose.      . vitamin C (ASCORBIC ACID) 500 MG tablet Take 500 mg by mouth daily.    . VOLTAREN 1 % GEL      No current facility-administered medications on file prior to visit.    Current Medications (verified) Current  Outpatient Prescriptions  Medication Sig Dispense Refill  . aspirin 81 MG tablet Take 81 mg by mouth daily.    . Azelaic Acid (FINACEA) 15 % cream Apply topically 1 day or 1 dose. After skin is thoroughly washed and patted dry, gently but thoroughly massage a thin film of azelaic acid cream into the affected area twice daily, in the morning and evening.     . calcium carbonate 200 MG capsule Take 2 capsules by mouth 2 (two) times daily with a meal.     . cholecalciferol (VITAMIN D) 1000 UNITS tablet Take 1,000 Units by mouth 2 (two) times daily.    . clindamycin (CLINDAGEL) 1 % gel Apply topically as needed.    . DULoxetine (CYMBALTA) 20 MG capsule Take 20 mg by mouth daily.    Marland Kitchen FIBER PO Take 2 capsules by mouth 2 (two) times daily.    Marland Kitchen loratadine (CLARITIN) 10 MG tablet Take 10 mg by mouth daily.    . mometasone (NASONEX) 50 MCG/ACT nasal spray Place 2 sprays into the nose daily.    . montelukast (SINGULAIR) 10 MG tablet Take 10 mg by mouth at bedtime.    . Multiple Vitamin (MULTIVITAMIN) tablet Take 1 tablet by mouth daily.    Marland Kitchen omeprazole (PRILOSEC) 40 MG capsule Take 1 capsule (40 mg total) by mouth 2 (two) times daily. 180 capsule 3  . Pitavastatin Calcium (LIVALO) 2 MG TABS Take 2 mg by mouth.     . Probiotic Product (PROBIOTIC DAILY PO) Take 1 tablet by mouth daily.    Marland Kitchen triamcinolone (KENALOG) 0.1 % cream Apply topically 1 day or 1 dose.      . vitamin C (ASCORBIC ACID) 500 MG tablet Take 500 mg by mouth daily.    . VOLTAREN 1 % GEL      No current facility-administered medications for this visit.     Allergies (verified) Penicillins; Cataflam; Cefuroxime axetil; Chlorzoxazone; Clarithromycin; Flagyl; Glucosamine; Guaifenesin & derivatives; Hydrocodone-acetaminophen; Ketorolac tromethamine; Metronidazole; Oxycodone; Pentazocine; Smz-tmp ds; Tramadol; Trazodone and nefazodone; and Vicodin   PAST HISTORY  Family History Family History  Problem Relation Age of Onset  . Breast  cancer Mother   . Arthritis Mother   . Hyperlipidemia Mother   . Hypertension Mother   . Lung cancer Father   . Pancreatic cancer Father   . Skin cancer Father   . Arthritis Father   . Celiac disease Brother   . Hyperlipidemia Maternal Grandfather   . Heart disease Maternal Grandfather   . Stroke Maternal Grandfather     Social History History  Substance Use Topics  . Smoking status: Never Smoker   . Smokeless tobacco: Never Used  . Alcohol Use: Yes     Comment: ocassional  Are there smokers in your home (other than you)? no  Risk Factors Current exercise habits: walking 40-45 minutes per day. Dietary issues discussed: reprots healthy diet. Doesn't eat eggs, sausage. Rare alcohol.  Cardiac risk factors: advanced age (older than 23 for men, 52 for women).  Depression Screen (Note: if answer to either of the following is "Yes", a more complete depression screening is indicated)   Over the past 2 weeks, have you felt down, depressed or hopeless? No  Over the past 2 weeks, have you felt little interest or pleasure in doing things? No  Have you lost interest or pleasure in daily life? Maybe a little.  Do you often feel hopeless? sometimes  Do you cry easily over simple problems? no  Activities of Daily Living In your present state of health, do you have any difficulty performing the following activities?:  Driving? no Managing money? no Feeding yourself? no Getting from bed to chair?no Climbing a flight of stairs? no Preparing food and eating? no Bathing or showering? no Getting dressed: no Getting to the toilet? no Using the toilet: no Moving around from place to place: no In the past year have you fallen or had a near fall?:No   Are you sexually active?  No  Do you have more than one partner?  No  Hearing Difficulties: No Do you often ask people to speak up or repeat themselves? No Do you experience ringing or noises in your ears? Yes-occasionally Do you  have difficulty understanding soft or whispered voices? No   Do you feel that you have a problem with memory? No  Do you often misplace items? No  Do you feel safe at home?  Yes  Cognitive Testing  Alert? Yes  Normal Appearance?Yes  Oriented to person? Yes  Place? Yes   Time? Yes  Recall of three objects?  yes  Can perform simple calculations? yes  Displays appropriate judgment?Yes  Can read the correct time from a watch face?Yes   Advanced Directives have been discussed with the patient? Yes    Immunization History  Administered Date(s) Administered  . Influenza-Unspecified 03/23/2014  . Td 02/16/2011  . Zoster 08/08/2009    Screening Tests Health Maintenance  Topic Date Due  . COLONOSCOPY  07/01/1999  . ZOSTAVAX  06/30/2009  . DEXA SCAN  06/30/2014  . PNEUMOCOCCAL POLYSACCHARIDE VACCINE AGE 48 AND OVER  06/30/2014  . INFLUENZA VACCINE  02/10/2015  . MAMMOGRAM  03/30/2016  . TETANUS/TDAP  02/15/2021    All answers were reviewed with the patient and necessary referrals were made:  O'SULLIVAN,MELISSA S., NP   08/12/2014    Review of Systems   Denies SOB, chest pain, swelling in lymph nodes, fever. Repots fatigue. Objective:     General Survey: Well-groomed, healthy-appearing. Responds appropriately to questions. Skin/Hair/Nails: Color pink. Skin warm and dry. Nails without clubbing or cyanosis.  HEENT: Skull normocephalic/atraumatic. Sclera white, conjunctiva pink. PERLA.   Bilateral tympanic membranes intact with good cone of light.  Throat: Oral mucosa pink, dentition good, pharynx without erythema or exudates. Neck - trachea midline. Neck supple. No thyromegaly. Lymph nodes: No cervical adenopathy. Thorax and Lungs: Thorax is symmetric with good expansion. Lungs resonant. Breath sounds vesicular without rales, rhonchi, wheezes. Respiration rate regular.  CV: S1 and S2 heard. No murmurs or extra sounds.  Extremities without edema.  Abdomen:  Abdomen is  rounded, soft, and non-tender with active bowel sounds. No palpable masses. No hepatosplenomegaly.    Mental Status: Alert and cooperative.  Oriented to person, place, and time. Thought process coherent. Cognitive processes intact. Motor: Good muscle bulk and tone. Strength 5/5 throughout.   Reflexes: 2+ patellar   Assessment:          Plan:     During the course of the visit the patient was educated and counseled about appropriate screening and preventive services including:   - Dexa scan  Diet review for nutrition referral? Yes ____  Not Indicated x____   Patient Instructions (the written plan) was given to the patient.  Medicare Attestation I have personally reviewed: The patient's medical and social history Their use of alcohol, tobacco or illicit drugs Their current medications and supplements The patient's functional ability including ADLs,fall risks, home safety risks, cognitive, and hearing and visual impairment Diet and physical activities Evidence for depression or mood disorders  The patient's weight, height, BMI, and visual acuity have been recorded in the chart.  I have made referrals, counseling, and provided education to the patient based on review of the above and I have provided the patient with a written personalized care plan for preventive services.    Patient seen along with [facility] NP-student.  I have personally seen and examined patient and agree with Ms. Arsal Tappan's assessment and plan- Debbrah Alar NP    Nance Pear., NP   08/12/2014      Patient ID: [name], female   DOB: 07-Feb-1951, 64 y.o.   MRN: [id number]

## 2014-08-14 ENCOUNTER — Telehealth: Payer: Self-pay | Admitting: Family

## 2014-08-14 NOTE — Telephone Encounter (Signed)
Spoke with pt. She will return Friday at 4:30pm to complete eye exam.

## 2014-08-14 NOTE — Telephone Encounter (Signed)
Pt is returning your call please call back

## 2014-08-14 NOTE — Telephone Encounter (Signed)
Left message on home # for pt to return my call. 

## 2014-08-14 NOTE — Telephone Encounter (Signed)
Could you please contact pt to return for vision screening as part of her medicare exam. We did not perform at time of her visit that I can see.

## 2014-08-16 NOTE — Telephone Encounter (Signed)
Pt completed eye exam and addendum has been added to previous office visit.

## 2014-09-05 DIAGNOSIS — M2011 Hallux valgus (acquired), right foot: Secondary | ICD-10-CM | POA: Diagnosis not present

## 2014-09-05 DIAGNOSIS — M19071 Primary osteoarthritis, right ankle and foot: Secondary | ICD-10-CM | POA: Diagnosis not present

## 2014-09-05 DIAGNOSIS — M19079 Primary osteoarthritis, unspecified ankle and foot: Secondary | ICD-10-CM | POA: Diagnosis not present

## 2014-09-23 ENCOUNTER — Ambulatory Visit (INDEPENDENT_AMBULATORY_CARE_PROVIDER_SITE_OTHER): Payer: Medicare Other | Admitting: Family

## 2014-09-23 ENCOUNTER — Encounter: Payer: Self-pay | Admitting: Family

## 2014-09-23 VITALS — BP 116/69 | HR 72 | Temp 98.1°F | Resp 16 | Ht 64.0 in | Wt 164.4 lb

## 2014-09-23 DIAGNOSIS — M797 Fibromyalgia: Secondary | ICD-10-CM

## 2014-09-23 DIAGNOSIS — Z13820 Encounter for screening for osteoporosis: Secondary | ICD-10-CM | POA: Diagnosis not present

## 2014-09-23 DIAGNOSIS — Z23 Encounter for immunization: Secondary | ICD-10-CM

## 2014-09-23 DIAGNOSIS — G4733 Obstructive sleep apnea (adult) (pediatric): Secondary | ICD-10-CM | POA: Diagnosis not present

## 2014-09-23 MED ORDER — DULOXETINE HCL 60 MG PO CPEP: 60.0000 mg | ORAL_CAPSULE | Freq: Every day | ORAL | Status: DC

## 2014-09-23 NOTE — Addendum Note (Signed)
Addended by: Debbrah Alar on: 09/23/2014 09:48 AM   Modules accepted: Orders, SmartSet

## 2014-09-23 NOTE — Patient Instructions (Signed)
Increase cymbalta to 60mg  once daily. Continue your CPAP. Follow up in 3 months.

## 2014-09-23 NOTE — Progress Notes (Signed)
Pre visit review using our clinic review tool, if applicable. No additional management support is needed unless otherwise documented below in the visit note. 

## 2014-09-23 NOTE — Assessment & Plan Note (Signed)
Improved on CPAP.  Continue same.  Commended her on her compliance.

## 2014-09-23 NOTE — Assessment & Plan Note (Signed)
Uncontrolled.  I advised pt that most of the local rheumatologists do not treat FM, but that we have room to increase cymbalta which should help. Increase cymbalta from 20mg  to 60mg .

## 2014-09-23 NOTE — Progress Notes (Signed)
Subjective:    Patient ID: Vanessa Oliver, female    DOB: 1950/09/12, 64 y.o.   MRN: [id number]  HPI  Vanessa Oliver is a 64 yr old female who presents today for follow up.  OSA- started CPAP on 2/4 and reports that she is using nasal mask with chin strap. Feeling well on CPAP. Reports improvement in her reflux symptoms since started CPAP. She canceled GI visit because symptoms were improved.    FM- Feels like her FM has improved since starting cpap but notes that this has acted up in the last week.    Review of Systems    see HPI  Past Medical History  Diagnosis Date  . Wears glasses   . Sinus complaint   . Arthritis   . Hyperlipidemia   . Hiatal hernia   . Schatzki's ring   . GERD (gastroesophageal reflux disease)   . Fibromyalgia   . Anxiety and depression   . IBS (irritable bowel syndrome)   . Fundic gland polyps of stomach, benign   . Diverticulosis   . Esophageal stricture   . Plantar fasciitis   . Rosacea   . TMJ (temporomandibular joint disorder)   . Vasovagal syncope   . Mononucleosis 1975  . Depression   . Allergy   . History of mononucleosis 1975  . Fibromyalgia     History   Social History  . Marital Status: Married    Spouse Name: N/A  . Number of Children: 0  . Years of Education: N/A   Occupational History  . clerical    Social History Main Topics  . Smoking status: Never Smoker   . Smokeless tobacco: Never Used  . Alcohol Use: Yes     Comment: ocassional  . Drug Use: No  . Sexual Activity: Not on file   Other Topics Concern  . Not on file   Social History Narrative   Works as an Glass blower/designer   Married- husband is 57 years older than her   Former Dance movement psychotherapist up up [employer]   Has cats   Enjoys reading   No children    Past Surgical History  Procedure Laterality Date  . Nissen fundoplication  3267  . Retinal laser procedure Right 02/01/2011    right eye   . Tonsillectomy  1981  . Nasal septum surgery  1970  . Knee  arthroscopy Right 1994    x 3  . 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 Carpal tunnel release Bilateral 2004  . Trigger finger release Bilateral 2005  . Abdominal hysterectomy  1997  . Varicose vein surgery Bilateral 2007, 2009    left 2007, right 2009  . Mallet finger Right 2011  . Vitrectomy Right 2012  . Cataract extraction Right 2013    Dr Ellison Hughs  . Carpometacarpal (cmc) fusion of thumb  2015    thumb    Family History  Problem Relation Age of Onset  . Breast cancer Mother   . Arthritis Mother   . Hyperlipidemia Mother   . Hypertension Mother   . Lung cancer Father   . Pancreatic cancer Father   . Skin cancer Father   . Arthritis Father   . Celiac disease Brother   . Hyperlipidemia Maternal Grandfather   . Heart disease Maternal Grandfather   . Stroke Maternal Grandfather     Allergies  Allergen Reactions  . Penicillins   . Cataflam [Diclofenac Potassium]   .  Cefuroxime Axetil   . Chlorzoxazone   . Clarithromycin   . Flagyl [Metronidazole Hcl]   . Glucosamine   . Guaifenesin & Derivatives   . Hydrocodone-Acetaminophen     REACTION: rash  . Ketorolac Tromethamine   . Metronidazole   . Oxycodone   . Pentazocine   . Smz-Tmp Ds [Sulfamethoxazole W/Trimethoprim (Co-Trimoxazole)]   . Tramadol   . Trazodone And Nefazodone   . Vicodin [Hydrocodone-Acetaminophen]     Current Outpatient Prescriptions on File Prior to Visit  Medication Sig Dispense Refill  . aspirin 81 MG tablet Take 81 mg by mouth daily.    . Azelaic Acid (FINACEA) 15 % cream Apply topically 1 day or 1 dose. After skin is thoroughly washed and patted dry, gently but thoroughly massage a thin film of azelaic acid cream into the affected area twice daily, in the morning and evening.     . Calcium Carbonate-Vitamin D (CALTRATE 600+D) 600-400 MG-UNIT per tablet Take 1 tablet by mouth 2 (two) times daily. (Patient taking differently: Take 2 tablets by mouth 2 (two) times daily. )    .  cholecalciferol (VITAMIN D) 1000 UNITS tablet Take 1,000 Units by mouth daily.     . DULoxetine (CYMBALTA) 20 MG capsule Take 20 mg by mouth daily.    Marland Kitchen FIBER PO Take 2 capsules by mouth 2 (two) times daily.    . mometasone (NASONEX) 50 MCG/ACT nasal spray Place 2 sprays into the nose daily.    . montelukast (SINGULAIR) 10 MG tablet Take 10 mg by mouth at bedtime.    . Multiple Vitamin (MULTIVITAMIN) tablet Take 1 tablet by mouth daily.    Marland Kitchen omeprazole (PRILOSEC) 40 MG capsule Take 1 capsule (40 mg total) by mouth 2 (two) times daily. 180 capsule 3  . Pitavastatin Calcium (LIVALO) 2 MG TABS Take 1 mg by mouth.     . Probiotic Product (PROBIOTIC DAILY PO) Take 1 tablet by mouth daily.    . vitamin C (ASCORBIC ACID) 500 MG tablet Take 500 mg by mouth daily.    . VOLTAREN 1 % GEL      No current facility-administered medications on file prior to visit.    BP 116/69 mmHg  Pulse 72  Temp(Src) 98.1 F (36.7 C) (Oral)  Resp 16  Ht 5\' 4"  (1.626 m)  Wt 164 lb 6.4 oz (74.571 kg)  BMI 28.21 kg/m2  SpO2 99%  LMP 05/14/1996    Objective:   Physical Exam  Constitutional: She is oriented to person, place, and time. She appears well-developed and well-nourished.  Cardiovascular: Normal rate, regular rhythm and normal heart sounds.   No murmur heard. Pulmonary/Chest: Effort normal and breath sounds normal. No respiratory distress. She has no wheezes.  Neurological: She is alert and oriented to person, place, and time.  Skin: Skin is warm.  Psychiatric: She has a normal mood and affect. Her behavior is normal. Judgment and thought content normal.          Assessment & Plan:

## 2014-09-26 ENCOUNTER — Encounter: Payer: Self-pay | Admitting: Family

## 2014-09-26 ENCOUNTER — Telehealth: Payer: Self-pay | Admitting: Pulmonary Disease

## 2014-09-26 NOTE — Telephone Encounter (Signed)
CPAP 10cm AHI 7/h Good usage No changes.

## 2014-09-27 DIAGNOSIS — M19071 Primary osteoarthritis, right ankle and foot: Secondary | ICD-10-CM | POA: Diagnosis not present

## 2014-09-27 DIAGNOSIS — M19079 Primary osteoarthritis, unspecified ankle and foot: Secondary | ICD-10-CM | POA: Diagnosis not present

## 2014-09-27 NOTE — Telephone Encounter (Signed)
Patient notified.  Nothing further needed. 

## 2014-10-04 ENCOUNTER — Encounter (HOSPITAL_BASED_OUTPATIENT_CLINIC_OR_DEPARTMENT_OTHER): Payer: Medicare Other

## 2014-10-08 ENCOUNTER — Encounter: Payer: Self-pay | Admitting: Family

## 2014-10-09 ENCOUNTER — Encounter: Payer: Self-pay | Admitting: Pulmonary Disease

## 2014-10-14 ENCOUNTER — Ambulatory Visit (INDEPENDENT_AMBULATORY_CARE_PROVIDER_SITE_OTHER)
Admission: RE | Admit: 2014-10-14 | Discharge: 2014-10-14 | Disposition: A | Discharge status: Home / Self Care | Payer: Medicare Other | Source: Ambulatory Visit | Attending: Family | Admitting: Family

## 2014-10-14 ENCOUNTER — Other Ambulatory Visit (HOSPITAL_COMMUNITY): Payer: Self-pay

## 2014-10-14 ENCOUNTER — Telehealth: Payer: Self-pay | Admitting: Family

## 2014-10-14 DIAGNOSIS — Z13820 Encounter for screening for osteoporosis: Secondary | ICD-10-CM

## 2014-10-14 DIAGNOSIS — Z780 Asymptomatic menopausal state: Secondary | ICD-10-CM

## 2014-10-14 NOTE — Telephone Encounter (Signed)
Caller name: Mardene Celeste  Call back number: [phone]   Reason for call:  [facility] called regarding orders placed pt has a bone density appointment today

## 2014-10-14 NOTE — Telephone Encounter (Signed)
Order previously entered for [facility]. Order re-entered for [facility].

## 2014-10-16 ENCOUNTER — Encounter: Payer: Self-pay | Admitting: Family

## 2014-10-17 ENCOUNTER — Encounter: Payer: Self-pay | Admitting: Family

## 2014-10-17 DIAGNOSIS — M8580 Other specified disorders of bone density and structure, unspecified site: Secondary | ICD-10-CM

## 2014-10-18 DIAGNOSIS — M8580 Other specified disorders of bone density and structure, unspecified site: Secondary | ICD-10-CM | POA: Insufficient documentation

## 2014-10-18 NOTE — Telephone Encounter (Signed)
Opened in error

## 2014-10-24 DIAGNOSIS — J399 Disease of upper respiratory tract, unspecified: Secondary | ICD-10-CM | POA: Diagnosis not present

## 2014-10-24 DIAGNOSIS — J309 Allergic rhinitis, unspecified: Secondary | ICD-10-CM | POA: Diagnosis not present

## 2014-10-28 ENCOUNTER — Ambulatory Visit (INDEPENDENT_AMBULATORY_CARE_PROVIDER_SITE_OTHER): Payer: Medicare Other | Admitting: Family

## 2014-10-28 ENCOUNTER — Encounter: Payer: Self-pay | Admitting: Family

## 2014-10-28 VITALS — BP 120/70 | HR 74 | Temp 97.9°F | Resp 16 | Ht 64.0 in | Wt 169.2 lb

## 2014-10-28 DIAGNOSIS — M797 Fibromyalgia: Secondary | ICD-10-CM | POA: Diagnosis not present

## 2014-10-28 DIAGNOSIS — M25551 Pain in right hip: Secondary | ICD-10-CM | POA: Diagnosis not present

## 2014-10-28 DIAGNOSIS — E559 Vitamin D deficiency, unspecified: Secondary | ICD-10-CM | POA: Diagnosis not present

## 2014-10-28 DIAGNOSIS — G4733 Obstructive sleep apnea (adult) (pediatric): Secondary | ICD-10-CM | POA: Diagnosis not present

## 2014-10-28 NOTE — Patient Instructions (Addendum)
You will be contacted about your referral to Dr. Barbaraann Barthel.  Please let us know if you have not heard back within 1 week about your referral.

## 2014-10-28 NOTE — Progress Notes (Signed)
Subjective:    Patient ID: Vanessa Oliver, female    DOB: 1951-01-24, 64 y.o.   MRN: [id number]  HPI  Ms. Mangine is a 64 yr old female who presents today for follow up of multiple medical problems.   [facility]A- compliance report reviewed and looked good.  Vit D deficiency- continues vit D supplement.   Hip pain- R hip- radiates down the right leg. Reports it improves with walking, worse with sleeping. She does report remote hx of right hip bursitis. She uses voltaren  Gel which helps some.    FM- increased cymbalta about 1 month ago- some improvement in her sleepiness.  Thinks some improvement in her fibro pain.  Wishes to continue current dose of cymbalta.    Review of Systems    see HPI  Past Medical History  Diagnosis Date  . Wears glasses   . Sinus complaint   . Arthritis   . Hyperlipidemia   . Hiatal hernia   . Schatzki's ring   . GERD (gastroesophageal reflux disease)   . Fibromyalgia   . Anxiety and depression   . IBS (irritable bowel syndrome)   . Fundic gland polyps of stomach, benign   . Diverticulosis   . Esophageal stricture   . Plantar fasciitis   . Rosacea   . TMJ (temporomandibular joint disorder)   . Vasovagal syncope   . Mononucleosis 1975  . Depression   . Allergy   . History of mononucleosis 1975  . Fibromyalgia     History   Social History  . Marital Status: Married    Spouse Name: N/A  . Number of Children: 0  . Years of Education: N/A   Occupational History  . clerical    Social History Main Topics  . Smoking status: Never Smoker   . Smokeless tobacco: Never Used  . Alcohol Use: Yes     Comment: ocassional  . Drug Use: No  . Sexual Activity: Not on file   Other Topics Concern  . Not on file   Social History Narrative   Works as an Glass blower/designer   Married- husband is 76 years older than her   Former Dance movement psychotherapist up up [employer]   Has cats   Enjoys reading   No children    Past Surgical History  Procedure Laterality  Date  . Nissen fundoplication  7408  . Retinal laser procedure Right 02/01/2011    right eye   . Tonsillectomy  1981  . Nasal septum surgery  1970  . Knee arthroscopy Right 1994    x 3  . 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 Carpal tunnel release Bilateral 2004  . Trigger finger release Bilateral 2005  . Abdominal hysterectomy  1997  . Varicose vein surgery Bilateral 2007, 2009    left 2007, right 2009  . Mallet finger Right 2011  . Vitrectomy Right 2012  . Cataract extraction Right 2013    Dr Ellison Hughs  . Carpometacarpal ([facility]c) fusion of thumb  2015    thumb    Family History  Problem Relation Age of Onset  . Breast cancer Mother   . Arthritis Mother   . Hyperlipidemia Mother   . Hypertension Mother   . Lung cancer Father   . Pancreatic cancer Father   . Skin cancer Father   . Arthritis Father   . Celiac disease Brother   . Hyperlipidemia Maternal Grandfather   . Heart disease Maternal  Grandfather   . Stroke Maternal Grandfather     Allergies  Allergen Reactions  . Cefuroxime Axetil     Hives / "throat swelling"  . Chlorzoxazone Rash and Other (See Comments)    "breathing problems"   . Oxycodone     Hallucinations, rash  . Penicillins Anaphylaxis  . Cataflam [Diclofenac Potassium]     Lip swelling  . Clarithromycin Diarrhea  . Flagyl [Metronidazole Hcl]     ?diarrhea  . Glucosamine Hives  . Guaifenesin & Derivatives     Stomach upset  . Hydrocodone-Acetaminophen     REACTION: rash, tightening of throat  . Ketorolac Tromethamine   . Pentazocine     Involuntary twitching  . Pneumovax [Pneumococcal Polysaccharide Vaccine] Other (See Comments)    Redness/swelling at site, nausea  . Smz-Tmp Ds [Sulfamethoxazole W/Trimethoprim (Co-Trimoxazole)]     ?unknown reaction  . Tramadol     constipation  . Trazodone And Nefazodone Other (See Comments)    Scratchy throat / congestion    Current Outpatient Prescriptions on File Prior to Visit    Medication Sig Dispense Refill  . aspirin 81 MG tablet Take 81 mg by mouth daily.    . Azelaic Acid (FINACEA) 15 % cream Apply topically 1 day or 1 dose. After skin is thoroughly washed and patted dry, gently but thoroughly massage a thin film of azelaic acid cream into the affected area twice daily, in the morning and evening.     . Calcium Carbonate-Vitamin D (CALTRATE 600+D) 600-400 MG-UNIT per tablet Take 1 tablet by mouth 2 (two) times daily. (Patient taking differently: Take 2 tablets by mouth 2 (two) times daily. )    . cholecalciferol (VITAMIN D) 1000 UNITS tablet Take 1,000 Units by mouth daily.     . DULoxetine (CYMBALTA) 60 MG capsule Take 1 capsule (60 mg total) by mouth daily. 90 capsule 1  . FIBER PO Take 2 capsules by mouth 2 (two) times daily.    . mometasone (NASONEX) 50 MCG/ACT nasal spray Place 2 sprays into the nose daily.    . montelukast (SINGULAIR) 10 MG tablet Take 10 mg by mouth at bedtime.    . Multiple Vitamin (MULTIVITAMIN) tablet Take 1 tablet by mouth daily.    Marland Kitchen omeprazole (PRILOSEC) 40 MG capsule Take 1 capsule (40 mg total) by mouth 2 (two) times daily. 180 capsule 3  . Pitavastatin Calcium (LIVALO) 2 MG TABS Take 1 mg by mouth.     . Probiotic Product (PROBIOTIC DAILY PO) Take 1 tablet by mouth daily.    . vitamin C (ASCORBIC ACID) 500 MG tablet Take 500 mg by mouth daily.    . VOLTAREN 1 % GEL      No current facility-administered medications on file prior to visit.    BP 120/70 mmHg  Pulse 74  Temp(Src) 97.9 F (36.6 C) (Oral)  Resp 16  Ht 5\' 4"  (1.626 m)  Wt 169 lb 3.2 oz (76.749 kg)  BMI 29.03 kg/m2  SpO2 99%  LMP 05/14/1996    Objective:   Physical Exam  Constitutional: She appears well-developed and well-nourished.  Cardiovascular: Normal rate, regular rhythm and normal heart sounds.   No murmur heard. Pulmonary/Chest: Effort normal and breath sounds normal. No respiratory distress. She has no wheezes.  Musculoskeletal:  R hip pain with  flexion and extension  Psychiatric: She has a normal mood and affect. Her behavior is normal. Judgment and thought content normal.  Assessment & Plan:

## 2014-10-28 NOTE — Progress Notes (Signed)
Pre visit review using our clinic review tool, if applicable. No additional management support is needed unless otherwise documented below in the visit note. 

## 2014-10-31 ENCOUNTER — Encounter: Payer: Self-pay | Admitting: Family Medicine

## 2014-10-31 ENCOUNTER — Ambulatory Visit: Payer: Medicare Other | Admitting: Family Medicine

## 2014-10-31 ENCOUNTER — Ambulatory Visit (INDEPENDENT_AMBULATORY_CARE_PROVIDER_SITE_OTHER): Payer: Medicare Other | Admitting: Family Medicine

## 2014-10-31 VITALS — BP 112/77 | HR 87 | Ht 64.0 in | Wt 164.0 lb

## 2014-10-31 DIAGNOSIS — M25551 Pain in right hip: Secondary | ICD-10-CM | POA: Diagnosis not present

## 2014-10-31 DIAGNOSIS — E559 Vitamin D deficiency, unspecified: Secondary | ICD-10-CM | POA: Insufficient documentation

## 2014-10-31 MED ORDER — METHYLPREDNISOLONE ACETATE 40 MG/ML IJ SUSP
40.0000 mg | Freq: Once | INTRAMUSCULAR | Status: AC
  Administered 2014-10-31: 40 mg via INTRA_ARTICULAR

## 2014-10-31 NOTE — Assessment & Plan Note (Signed)
Tolerating CPAP, continue same. 

## 2014-10-31 NOTE — Assessment & Plan Note (Signed)
Some improvement with cymbalta, continue same.

## 2014-10-31 NOTE — Assessment & Plan Note (Signed)
Continue supplement, plan vit D next visit.

## 2014-10-31 NOTE — Assessment & Plan Note (Signed)
New. Refer to sports medicine for further evaluation.

## 2014-10-31 NOTE — Patient Instructions (Signed)
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 Avoid painful activities as much as possible. Ice over area of pain 3-4 times a day for 15 minutes at a time Hip side raise exercise 3 sets of 10-15 once a day - add weights if this becomes too easy. Stretches - pick 2 and hold for 20-30 seconds x 3 - do once or twice a day. Tylenol and/or aleve as needed for pain. You were given an injection today. If not improving, can consider physical therapy. Follow up with me in 1 month.

## 2014-11-04 NOTE — Assessment & Plan Note (Signed)
due to combination of IT band syndrome, trochanteric bursitis, gluteus medius weakness.  Shown home exercises and stretches to do daily.  Icing, tylenol/nsaids as needed.  Trochanteric bursa injection given today.  F/u in 1 month.  Consider PT if not improving.  After informed written consent patient was lying on left side on exam table.  Area overlying right greater trochanter prepped with alcohol swab then greater trochanter injected with 6:2 marcaine: depomedrol.  Patient tolerated procedure well without immediate complications.

## 2014-11-04 NOTE — Progress Notes (Signed)
PCP and referred by: Nance Pear., NP  Subjective:   HPI: Patient is a 64 y.o. female here for right hip pain.  Patient reports her pain in right hip dates back to 1977 when it popped while she was in the [employer]. Was bad again around 1985 - improved with a cortisone shot and TENS unit. Started to get bad again over past 2 years. Pain on lateral hip and buttock, radiates down leg when sleeping. Ok with stairs and walking. Not taking anything for this. No numbness or tingling.  Past Medical History  Diagnosis Date  . Wears glasses   . Sinus complaint   . Arthritis   . Hyperlipidemia   . Hiatal hernia   . Schatzki's ring   . GERD (gastroesophageal reflux disease)   . Fibromyalgia   . Anxiety and depression   . IBS (irritable bowel syndrome)   . Fundic gland polyps of stomach, benign   . Diverticulosis   . Esophageal stricture   . Plantar fasciitis   . Rosacea   . TMJ (temporomandibular joint disorder)   . Vasovagal syncope   . Mononucleosis 1975  . Depression   . Allergy   . History of mononucleosis 1975  . Fibromyalgia     Current Outpatient Prescriptions on File Prior to Visit  Medication Sig Dispense Refill  . aspirin 81 MG tablet Take 81 mg by mouth daily.    . Azelaic Acid (FINACEA) 15 % cream Apply topically 1 day or 1 dose. After skin is thoroughly washed and patted dry, gently but thoroughly massage a thin film of azelaic acid cream into the affected area twice daily, in the morning and evening.     . Calcium Carbonate-Vitamin D (CALTRATE 600+D) 600-400 MG-UNIT per tablet Take 1 tablet by mouth 2 (two) times daily. (Patient taking differently: Take 2 tablets by mouth 2 (two) times daily. )    . cholecalciferol (VITAMIN D) 1000 UNITS tablet Take 1,000 Units by mouth daily.     . DULoxetine (CYMBALTA) 60 MG capsule Take 1 capsule (60 mg total) by mouth daily. 90 capsule 1  . FIBER PO Take 2 capsules by mouth 2 (two) times daily.    . mometasone  (NASONEX) 50 MCG/ACT nasal spray Place 2 sprays into the nose daily.    . montelukast (SINGULAIR) 10 MG tablet Take 10 mg by mouth at bedtime.    . Multiple Vitamin (MULTIVITAMIN) tablet Take 1 tablet by mouth daily.    Marland Kitchen omeprazole (PRILOSEC) 40 MG capsule Take 1 capsule (40 mg total) by mouth 2 (two) times daily. 180 capsule 3  . Pitavastatin Calcium (LIVALO) 2 MG TABS Take 1 mg by mouth.     . Probiotic Product (PROBIOTIC DAILY PO) Take 1 tablet by mouth daily.    . vitamin C (ASCORBIC ACID) 500 MG tablet Take 500 mg by mouth daily.    . VOLTAREN 1 % GEL      No current facility-administered medications on file prior to visit.    Past Surgical History  Procedure Laterality Date  . Nissen fundoplication  0350  . Retinal laser procedure Right 02/01/2011    right eye   . Tonsillectomy  1981  . Nasal septum surgery  1970  . Knee arthroscopy Right 1994    x 3  . 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 Carpal tunnel release Bilateral 2004  . Trigger finger release Bilateral 2005  . Abdominal hysterectomy  1997  .  Varicose vein surgery Bilateral 2007, 2009    left 2007, right 2009  . Mallet finger Right 2011  . Vitrectomy Right 2012  . Cataract extraction Right 2013    Dr Ellison Hughs  . Carpometacarpal ([facility]) fusion of thumb  2015    thumb    Allergies  Allergen Reactions  . Cefuroxime Axetil     Hives / "throat swelling"  . Chlorzoxazone Rash and Other (See Comments)    "breathing problems"   . Oxycodone     Hallucinations, rash  . Penicillins Anaphylaxis  . Cataflam [Diclofenac Potassium]     Lip swelling  . Clarithromycin Diarrhea  . Flagyl [Metronidazole Hcl]     ?diarrhea  . Glucosamine Hives  . Guaifenesin & Derivatives     Stomach upset  . Hydrocodone-Acetaminophen     REACTION: rash, tightening of throat  . Ketorolac Tromethamine   . Pentazocine     Involuntary twitching  . Pneumovax [Pneumococcal Polysaccharide Vaccine] Other (See Comments)     Redness/swelling at site, nausea  . Smz-Tmp Ds [Sulfamethoxazole W/Trimethoprim (Co-Trimoxazole)]     ?unknown reaction  . Tramadol     constipation  . Trazodone And Nefazodone Other (See Comments)    Scratchy throat / congestion    History   Social History  . Marital Status: Married    Spouse Name: N/A  . Number of Children: 0  . Years of Education: N/A   Occupational History  . clerical    Social History Main Topics  . Smoking status: Never Smoker   . Smokeless tobacco: Never Used  . Alcohol Use: 0.0 oz/week    0 Standard drinks or equivalent per week     Comment: ocassional  . Drug Use: No  . Sexual Activity: Not on file   Other Topics Concern  . Not on file   Social History Narrative   Works as an Glass blower/designer   Married- husband is 83 years older than her   Former Dance movement psychotherapist up up [employer]   Has cats   Enjoys reading   No children    Family History  Problem Relation Age of Onset  . Breast cancer Mother   . Arthritis Mother   . Hyperlipidemia Mother   . Hypertension Mother   . Lung cancer Father   . Pancreatic cancer Father   . Skin cancer Father   . Arthritis Father   . Celiac disease Brother   . Hyperlipidemia Maternal Grandfather   . Heart disease Maternal Grandfather   . Stroke Maternal Grandfather     BP 112/77 mmHg  Pulse 87  Ht 5\' 4"  (1.626 m)  Wt 164 lb (74.39 kg)  BMI 28.14 kg/m2  LMP 05/14/1996  Review of Systems: See HPI above.    Objective:  Physical Exam:  Gen: NAD  Right hip/back: No gross deformity, scoliosis. TTP proximal IT band, greater trochanter, buttock.  No midline or bony TTP. FROM. Strength LEs 5/5 all muscle groups except 4/5 painful hip abduction. 2+ MSRs in patellar and achilles tendons, equal bilaterally. Negative SLRs. Sensation intact to light touch bilaterally. Negative logroll bilateral hips Negative fabers and piriformis stretches.    Assessment & Plan:  1. Right hip pain - due to combination  of IT band syndrome, trochanteric bursitis, gluteus medius weakness.  Shown home exercises and stretches to do daily.  Icing, tylenol/nsaids as needed.  Trochanteric bursa injection given today.  F/u in 1 month.  Consider PT if not improving.  After  informed written consent patient was lying on left side on exam table.  Area overlying right greater trochanter prepped with alcohol swab then greater trochanter injected with 6:2 marcaine: depomedrol.  Patient tolerated procedure well without immediate complications.

## 2014-11-19 ENCOUNTER — Ambulatory Visit: Payer: BC Managed Care – PPO | Admitting: Family Medicine

## 2014-11-19 DIAGNOSIS — D2221 Melanocytic nevi of right ear and external auricular canal: Secondary | ICD-10-CM | POA: Diagnosis not present

## 2014-11-19 DIAGNOSIS — D2262 Melanocytic nevi of left upper limb, including shoulder: Secondary | ICD-10-CM | POA: Diagnosis not present

## 2014-11-19 DIAGNOSIS — D225 Melanocytic nevi of trunk: Secondary | ICD-10-CM | POA: Diagnosis not present

## 2014-11-19 DIAGNOSIS — D2272 Melanocytic nevi of left lower limb, including hip: Secondary | ICD-10-CM | POA: Diagnosis not present

## 2014-11-19 DIAGNOSIS — L821 Other seborrheic keratosis: Secondary | ICD-10-CM | POA: Diagnosis not present

## 2014-11-19 DIAGNOSIS — Z808 Family history of malignant neoplasm of other organs or systems: Secondary | ICD-10-CM | POA: Diagnosis not present

## 2014-11-19 DIAGNOSIS — D485 Neoplasm of uncertain behavior of skin: Secondary | ICD-10-CM | POA: Diagnosis not present

## 2014-11-19 DIAGNOSIS — L910 Hypertrophic scar: Secondary | ICD-10-CM | POA: Diagnosis not present

## 2014-11-20 DIAGNOSIS — L820 Inflamed seborrheic keratosis: Secondary | ICD-10-CM | POA: Diagnosis not present

## 2014-11-21 ENCOUNTER — Encounter: Payer: Self-pay | Admitting: Family

## 2014-11-22 MED ORDER — DICLOFENAC SODIUM 1 % TD GEL: 2.0000 g | Freq: Two times a day (BID) | TRANSDERMAL | Status: DC | PRN

## 2014-11-29 ENCOUNTER — Ambulatory Visit (INDEPENDENT_AMBULATORY_CARE_PROVIDER_SITE_OTHER): Payer: Medicare Other | Admitting: Family Medicine

## 2014-11-29 ENCOUNTER — Encounter: Payer: Self-pay | Admitting: Family Medicine

## 2014-11-29 VITALS — BP 119/75 | HR 79 | Ht 64.0 in | Wt 163.0 lb

## 2014-11-29 DIAGNOSIS — M25551 Pain in right hip: Secondary | ICD-10-CM | POA: Diagnosis not present

## 2014-12-03 NOTE — Assessment & Plan Note (Signed)
due to combination of IT band syndrome, trochanteric bursitis, gluteus medius weakness.  Resolved following trochanteric bursa injeciton, home exercises.  Continue home exercises for 6 more weeks.  F/u prn.

## 2014-12-03 NOTE — Progress Notes (Signed)
PCP and referred by: Nance Pear., NP  Subjective:   HPI: Patient is a 64 y.o. female here for right hip pain.  4/21: Patient reports her pain in right hip dates back to 1977 when it popped while she was in the [employer]. Was bad again around 1985 - improved with a cortisone shot and TENS unit. Started to get bad again over past 2 years. Pain on lateral hip and buttock, radiates down leg when sleeping. Ok with stairs and walking. Not taking anything for this. No numbness or tingling.  5/20: Patient reports she feels completely improved. Used voltaren, did home exercises. Injection provided significant relief.  Past Medical History  Diagnosis Date  . Wears glasses   . Sinus complaint   . Arthritis   . Hyperlipidemia   . Hiatal hernia   . Schatzki's ring   . GERD (gastroesophageal reflux disease)   . Fibromyalgia   . Anxiety and depression   . IBS (irritable bowel syndrome)   . Fundic gland polyps of stomach, benign   . Diverticulosis   . Esophageal stricture   . Plantar fasciitis   . Rosacea   . TMJ (temporomandibular joint disorder)   . Vasovagal syncope   . Mononucleosis 1975  . Depression   . Allergy   . History of mononucleosis 1975  . Fibromyalgia     Current Outpatient Prescriptions on File Prior to Visit  Medication Sig Dispense Refill  . aspirin 81 MG tablet Take 81 mg by mouth daily.    . Azelaic Acid (FINACEA) 15 % cream Apply topically 1 day or 1 dose. After skin is thoroughly washed and patted dry, gently but thoroughly massage a thin film of azelaic acid cream into the affected area twice daily, in the morning and evening.     . Calcium Carbonate-Vitamin D (CALTRATE 600+D) 600-400 MG-UNIT per tablet Take 1 tablet by mouth 2 (two) times daily. (Patient taking differently: Take 2 tablets by mouth 2 (two) times daily. )    . cholecalciferol (VITAMIN D) 1000 UNITS tablet Take 1,000 Units by mouth daily.     . diclofenac sodium (VOLTAREN) 1 %  GEL Apply 2 g topically 2 (two) times daily as needed. 300 g 1  . DULoxetine (CYMBALTA) 60 MG capsule Take 1 capsule (60 mg total) by mouth daily. 90 capsule 1  . FIBER PO Take 2 capsules by mouth 2 (two) times daily.    . mometasone (NASONEX) 50 MCG/ACT nasal spray Place 2 sprays into the nose daily.    . montelukast (SINGULAIR) 10 MG tablet Take 10 mg by mouth at bedtime.    . Multiple Vitamin (MULTIVITAMIN) tablet Take 1 tablet by mouth daily.    Marland Kitchen omeprazole (PRILOSEC) 40 MG capsule Take 1 capsule (40 mg total) by mouth 2 (two) times daily. 180 capsule 3  . Pitavastatin Calcium (LIVALO) 2 MG TABS Take 1 mg by mouth.     . Probiotic Product (PROBIOTIC DAILY PO) Take 1 tablet by mouth daily.    . vitamin C (ASCORBIC ACID) 500 MG tablet Take 500 mg by mouth daily.     No current facility-administered medications on file prior to visit.    Past Surgical History  Procedure Laterality Date  . Nissen fundoplication  5170  . Retinal laser procedure Right 02/01/2011    right eye   . Tonsillectomy  1981  . Nasal septum surgery  1970  . Knee arthroscopy Right 1994    x 3  . Foot  surgery Left 2006, 2007    mortans neuroma  . Carpal tunnel release Bilateral 2004  . Trigger finger release Bilateral 2005  . Abdominal hysterectomy  1997  . Varicose vein surgery Bilateral 2007, 2009    left 2007, right 2009  . Mallet finger Right 2011  . Vitrectomy Right 2012  . Cataract extraction Right 2013    Dr Ellison Hughs  . Carpometacarpal ([facility]c) fusion of thumb  2015    thumb    Allergies  Allergen Reactions  . Cefuroxime Axetil     Hives / "throat swelling"  . Chlorzoxazone Rash and Other (See Comments)    "breathing problems"   . Oxycodone     Hallucinations, rash  . Penicillins Anaphylaxis  . Cataflam [Diclofenac Potassium]     Lip swelling  . Clarithromycin Diarrhea  . Flagyl [Metronidazole Hcl]     ?diarrhea  . Glucosamine Hives  . Guaifenesin & Derivatives     Stomach upset  .  Hydrocodone-Acetaminophen     REACTION: rash, tightening of throat  . Ketorolac Tromethamine   . Pentazocine     Involuntary twitching  . Pneumovax [Pneumococcal Polysaccharide Vaccine] Other (See Comments)    Redness/swelling at site, nausea  . Smz-Tmp Ds [Sulfamethoxazole W/Trimethoprim (Co-Trimoxazole)]     ?unknown reaction  . Tramadol     constipation  . Trazodone And Nefazodone Other (See Comments)    Scratchy throat / congestion    History   Social History  . Marital Status: Married    Spouse Name: N/A  . Number of Children: 0  . Years of Education: N/A   Occupational History  . clerical    Social History Main Topics  . Smoking status: Never Smoker   . Smokeless tobacco: Never Used  . Alcohol Use: 0.0 oz/week    0 Standard drinks or equivalent per week     Comment: ocassional  . Drug Use: No  . Sexual Activity: Not on file   Other Topics Concern  . Not on file   Social History Narrative   Works as an Glass blower/designer   Married- husband is 31 years older than her   Former Dance movement psychotherapist up up [employer]   Has cats   Enjoys reading   No children    Family History  Problem Relation Age of Onset  . Breast cancer Mother   . Arthritis Mother   . Hyperlipidemia Mother   . Hypertension Mother   . Lung cancer Father   . Pancreatic cancer Father   . Skin cancer Father   . Arthritis Father   . Celiac disease Brother   . Hyperlipidemia Maternal Grandfather   . Heart disease Maternal Grandfather   . Stroke Maternal Grandfather     BP 119/75 mmHg  Pulse 79  Ht 5\' 4"  (1.626 m)  Wt 163 lb (73.936 kg)  BMI 27.97 kg/m2  LMP 05/14/1996  Review of Systems: See HPI above.    Objective:  Physical Exam:  Gen: NAD  Right hip/back: No gross deformity, scoliosis. No TTP proximal IT band, greater trochanter, buttock.  No midline or bony TTP. FROM. Strength LEs 5/5 all muscle groups except 5-/5 hip abduction but not painful now. 2+ MSRs in patellar and  achilles tendons, equal bilaterally. Negative SLRs. Sensation intact to light touch bilaterally. Negative logroll bilateral hips    Assessment & Plan:  1. Right hip pain - due to combination of IT band syndrome, trochanteric bursitis, gluteus medius weakness.  Resolved following  trochanteric bursa injeciton, home exercises.  Continue home exercises for 6 more weeks.  F/u prn.

## 2014-12-13 ENCOUNTER — Encounter: Payer: Self-pay | Admitting: Internal Medicine

## 2014-12-30 ENCOUNTER — Ambulatory Visit: Payer: Medicare Other | Admitting: Family

## 2015-01-07 ENCOUNTER — Encounter: Payer: Self-pay | Admitting: Family

## 2015-01-07 ENCOUNTER — Ambulatory Visit (INDEPENDENT_AMBULATORY_CARE_PROVIDER_SITE_OTHER): Payer: Medicare Other | Admitting: Family

## 2015-01-07 VITALS — BP 130/84 | HR 75 | Temp 97.6°F | Ht 64.0 in | Wt 167.2 lb

## 2015-01-07 DIAGNOSIS — M797 Fibromyalgia: Secondary | ICD-10-CM | POA: Diagnosis not present

## 2015-01-07 DIAGNOSIS — G8929 Other chronic pain: Secondary | ICD-10-CM

## 2015-01-07 DIAGNOSIS — E785 Hyperlipidemia, unspecified: Secondary | ICD-10-CM | POA: Diagnosis not present

## 2015-01-07 DIAGNOSIS — R1031 Right lower quadrant pain: Secondary | ICD-10-CM

## 2015-01-07 DIAGNOSIS — E559 Vitamin D deficiency, unspecified: Secondary | ICD-10-CM | POA: Diagnosis not present

## 2015-01-07 LAB — LIPID PANEL
CHOL/HDL RATIO: 4
CHOLESTEROL: 171 mg/dL (ref 0–200)
HDL: 41.3 mg/dL (ref >39.00)
LDL Cholesterol: 96 mg/dL (ref 0–99)
NonHDL: 129.7
TRIGLYCERIDES: 169 mg/dL — AB (ref 0.0–149.0)
VLDL: 33.8 mg/dL (ref 0.0–40.0)

## 2015-01-07 LAB — BASIC METABOLIC PANEL
BUN: 18 mg/dL (ref 6–23)
CALCIUM: 9.8 mg/dL (ref 8.4–10.5)
CO2: 27 mEq/L (ref 19–32)
Chloride: 103 mEq/L (ref 96–112)
Creatinine, Ser: 0.77 mg/dL (ref 0.40–1.20)
GFR: 79.83 mL/min (ref >60.00)
GLUCOSE: 86 mg/dL (ref 70–99)
POTASSIUM: 4.1 meq/L (ref 3.5–5.1)
Sodium: 139 mEq/L (ref 135–145)

## 2015-01-07 LAB — VITAMIN D 25 HYDROXY (VIT D DEFICIENCY, FRACTURES): VITD: 29.12 ng/mL — AB (ref 30.00–100.00)

## 2015-01-07 MED ORDER — DULOXETINE HCL 60 MG PO CPEP: 60.0000 mg | ORAL_CAPSULE | Freq: Every day | ORAL | Status: DC

## 2015-01-07 NOTE — Progress Notes (Signed)
Pre visit review using our clinic review tool, if applicable. No additional management support is needed unless otherwise documented below in the visit note. 

## 2015-01-07 NOTE — Patient Instructions (Signed)
Please complete lab work prior to leaving. You will be contacted about your CT scan Follow up in 6 months. Enjoy your retirement.

## 2015-01-07 NOTE — Assessment & Plan Note (Signed)
Clinically doubt appendicitis, but will obtain CT abd/pelvis to further evaluate to be sure.

## 2015-01-07 NOTE — Assessment & Plan Note (Signed)
Obtain vit D level.  

## 2015-01-07 NOTE — Progress Notes (Signed)
Subjective:    Patient ID: Vanessa Oliver, female    DOB: 1950-10-26, 64 y.o.   MRN: [id number]  HPI   Vanessa Oliver is a 64 yr old female who presents today for follow up. Was laid off from her job.    Fibromyalgia- cymbalta was increased 1 month ago.  Still wakes up sleepy.    She is seeing Vanessa Oliver at [facility] ortho for right great toe pain.   Vit D deficiency-  Maintained on vit D supplement.   OSA- reports compliance with CPAP but she is awaiting a better fitting mask from the home care company  Reports dull throbbing ache in the right lower abdomen, sometimes radiates down the right anterior leg.  Has been present x 1 year.  Review of Systems    see HPI  Past Medical History  Diagnosis Date  . Wears glasses   . Sinus complaint   . Arthritis   . Hyperlipidemia   . Hiatal hernia   . Schatzki's ring   . GERD (gastroesophageal reflux disease)   . Fibromyalgia   . Anxiety and depression   . IBS (irritable bowel syndrome)   . Fundic gland polyps of stomach, benign   . Diverticulosis   . Esophageal stricture   . Plantar fasciitis   . Rosacea   . TMJ (temporomandibular joint disorder)   . Vasovagal syncope   . Mononucleosis 1975  . Depression   . Allergy   . History of mononucleosis 1975  . Fibromyalgia     History   Social History  . Marital Status: Married    Spouse Name: N/A  . Number of Children: 0  . Years of Education: N/A   Occupational History  . clerical    Social History Main Topics  . Smoking status: Never Smoker   . Smokeless tobacco: Never Used  . Alcohol Use: 0.0 oz/week    0 Standard drinks or equivalent per week     Comment: ocassional  . Drug Use: No  . Sexual Activity: Not on file   Other Topics Concern  . Not on file   Social History Narrative   Works as an Glass blower/designer   Married- husband is 80 years older than her   Former Dance movement psychotherapist up up [employer]   Has cats   Enjoys reading   No children    Past Surgical History    Procedure Laterality Date  . Nissen fundoplication  0737  . Retinal laser procedure Right 02/01/2011    right eye   . Tonsillectomy  1981  . Nasal septum surgery  1970  . Knee arthroscopy Right 1994    x 3  . 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 Carpal tunnel release Bilateral 2004  . Trigger finger release Bilateral 2005  . Abdominal hysterectomy  1997  . Varicose vein surgery Bilateral 2007, 2009    left 2007, right 2009  . Mallet finger Right 2011  . Vitrectomy Right 2012  . Cataract extraction Right 2013    Dr Vanessa Oliver  . Carpometacarpal (cmc) fusion of thumb  2015    thumb    Family History  Problem Relation Age of Onset  . Breast cancer Mother   . Arthritis Mother   . Hyperlipidemia Mother   . Hypertension Mother   . Lung cancer Father   . Pancreatic cancer Father   . Skin cancer Father   . Arthritis Father   .  Celiac disease Brother   . Hyperlipidemia Maternal Grandfather   . Heart disease Maternal Grandfather   . Stroke Maternal Grandfather     Allergies  Allergen Reactions  . Cefuroxime Axetil     Hives / "throat swelling"  . Chlorzoxazone Rash and Other (See Comments)    "breathing problems"   . Oxycodone     Hallucinations, rash  . Penicillins Anaphylaxis  . Cataflam [Diclofenac Potassium]     Lip swelling  . Clarithromycin Diarrhea  . Flagyl [Metronidazole Hcl]     ?diarrhea  . Glucosamine Hives  . Guaifenesin & Derivatives     Stomach upset  . Hydrocodone-Acetaminophen     REACTION: rash, tightening of throat  . Ketorolac Tromethamine   . Pentazocine     Involuntary twitching  . Pneumovax [Pneumococcal Polysaccharide Vaccine] Other (See Comments)    Redness/swelling at site, nausea  . Smz-Tmp Ds [Sulfamethoxazole W/Trimethoprim (Co-Trimoxazole)]     ?unknown reaction  . Tramadol     constipation  . Trazodone And Nefazodone Other (See Comments)    Scratchy throat / congestion    Current Outpatient Prescriptions on File  Prior to Visit  Medication Sig Dispense Refill  . aspirin 81 MG tablet Take 81 mg by mouth daily.    . Azelaic Acid (FINACEA) 15 % cream Apply topically 1 day or 1 dose. After skin is thoroughly washed and patted dry, gently but thoroughly massage a thin film of azelaic acid cream into the affected area twice daily, in the morning and evening.     . Calcium Carbonate-Vitamin D (CALTRATE 600+D) 600-400 MG-UNIT per tablet Take 1 tablet by mouth 2 (two) times daily. (Patient taking differently: Take 2 tablets by mouth 2 (two) times daily. )    . cholecalciferol (VITAMIN D) 1000 UNITS tablet Take 1,000 Units by mouth daily.     . diclofenac sodium (VOLTAREN) 1 % GEL Apply 2 g topically 2 (two) times daily as needed. 300 g 1  . FIBER PO Take 2 capsules by mouth 2 (two) times daily.    . mometasone (NASONEX) 50 MCG/ACT nasal spray Place 2 sprays into the nose daily.    . montelukast (SINGULAIR) 10 MG tablet Take 10 mg by mouth at bedtime.    . Multiple Vitamin (MULTIVITAMIN) tablet Take 1 tablet by mouth daily.    Marland Kitchen omeprazole (PRILOSEC) 40 MG capsule Take 1 capsule (40 mg total) by mouth 2 (two) times daily. 180 capsule 3  . Pitavastatin Calcium (LIVALO) 2 MG TABS Take 1 mg by mouth.     . Probiotic Product (PROBIOTIC DAILY PO) Take 1 tablet by mouth daily.    . vitamin C (ASCORBIC ACID) 500 MG tablet Take 500 mg by mouth daily.     No current facility-administered medications on file prior to visit.    BP 130/84 mmHg  Pulse 75  Temp(Src) 97.6 F (36.4 C) (Oral)  Ht 5\' 4"  (1.626 m)  Wt 167 lb 4 oz (75.864 kg)  BMI 28.69 kg/m2  SpO2 98%  LMP 05/14/1996    Objective:   Physical Exam  Constitutional: She is oriented to person, place, and time. She appears well-developed and well-nourished.  HENT:  Head: Normocephalic and atraumatic.  Cardiovascular: Normal rate, regular rhythm and normal heart sounds.   No murmur heard. Pulmonary/Chest: Effort normal and breath sounds normal. No  respiratory distress. She has no wheezes.  Abdominal: Soft. Bowel sounds are normal.  RLQ tenderness to palpation  Musculoskeletal: She exhibits no edema.  1+ bilateral LE edema noted.   Neurological: She is alert and oriented to person, place, and time.  Psychiatric: She has a normal mood and affect. Her behavior is normal. Judgment and thought content normal.             Assessment & Plan:

## 2015-01-07 NOTE — Assessment & Plan Note (Signed)
I suspect that since she will no longer be working and will be retiring, that her fatigue and fibro symptom will improve. Continue cymbalta.  Monitor.

## 2015-01-08 ENCOUNTER — Encounter: Payer: Self-pay | Admitting: Family

## 2015-01-08 ENCOUNTER — Ambulatory Visit (HOSPITAL_BASED_OUTPATIENT_CLINIC_OR_DEPARTMENT_OTHER)
Admission: RE | Admit: 2015-01-08 | Discharge: 2015-01-08 | Disposition: A | Discharge status: Home / Self Care | Payer: Medicare Other | Source: Ambulatory Visit | Attending: Family | Admitting: Family

## 2015-01-08 ENCOUNTER — Encounter (HOSPITAL_BASED_OUTPATIENT_CLINIC_OR_DEPARTMENT_OTHER): Payer: Self-pay

## 2015-01-08 DIAGNOSIS — R1031 Right lower quadrant pain: Secondary | ICD-10-CM | POA: Insufficient documentation

## 2015-01-08 DIAGNOSIS — R10813 Right lower quadrant abdominal tenderness: Secondary | ICD-10-CM | POA: Diagnosis not present

## 2015-01-08 MED ORDER — IOHEXOL 300 MG/ML  SOLN
100.0000 mL | Freq: Once | INTRAMUSCULAR | Status: AC | PRN
  Administered 2015-01-08: 100 mL via INTRAVENOUS

## 2015-01-10 ENCOUNTER — Other Ambulatory Visit: Payer: Self-pay | Admitting: Family

## 2015-01-10 ENCOUNTER — Encounter: Payer: Self-pay | Admitting: Family

## 2015-01-10 DIAGNOSIS — E559 Vitamin D deficiency, unspecified: Secondary | ICD-10-CM

## 2015-01-10 MED ORDER — VITAMIN D (ERGOCALCIFEROL) 1.25 MG (50000 UNIT) PO CAPS: 50000.0000 [IU] | ORAL_CAPSULE | ORAL | Status: DC

## 2015-01-15 DIAGNOSIS — M2011 Hallux valgus (acquired), right foot: Secondary | ICD-10-CM | POA: Diagnosis not present

## 2015-01-15 DIAGNOSIS — M2021 Hallux rigidus, right foot: Secondary | ICD-10-CM | POA: Diagnosis not present

## 2015-01-20 ENCOUNTER — Encounter: Payer: Self-pay | Admitting: Family

## 2015-01-20 DIAGNOSIS — M5441 Lumbago with sciatica, right side: Secondary | ICD-10-CM

## 2015-01-21 NOTE — Addendum Note (Signed)
Addended by: Debbrah Alar on: 01/21/2015 09:12 AM   Modules accepted: Orders

## 2015-01-23 ENCOUNTER — Telehealth: Payer: Self-pay | Admitting: Family

## 2015-01-23 NOTE — Telephone Encounter (Signed)
Appointment scheduled for 7/18

## 2015-01-23 NOTE — Telephone Encounter (Signed)
Vanessa Oliver, please contact pt and arrange a follow up apt with me. She needs a dedicated sleep apnea follow up for her cpap for insurance purposes. thanks

## 2015-01-25 ENCOUNTER — Ambulatory Visit (HOSPITAL_BASED_OUTPATIENT_CLINIC_OR_DEPARTMENT_OTHER)
Admission: RE | Admit: 2015-01-25 | Discharge: 2015-01-25 | Disposition: A | Discharge status: Home / Self Care | Payer: Medicare Other | Source: Ambulatory Visit | Attending: Family | Admitting: Family

## 2015-01-25 DIAGNOSIS — M4806 Spinal stenosis, lumbar region: Secondary | ICD-10-CM | POA: Insufficient documentation

## 2015-01-25 DIAGNOSIS — M545 Low back pain: Secondary | ICD-10-CM | POA: Diagnosis present

## 2015-01-25 DIAGNOSIS — M5441 Lumbago with sciatica, right side: Secondary | ICD-10-CM

## 2015-01-25 DIAGNOSIS — M9973 Connective tissue and disc stenosis of intervertebral foramina of lumbar region: Secondary | ICD-10-CM | POA: Diagnosis not present

## 2015-01-25 DIAGNOSIS — M47816 Spondylosis without myelopathy or radiculopathy, lumbar region: Secondary | ICD-10-CM | POA: Diagnosis not present

## 2015-01-25 DIAGNOSIS — M5126 Other intervertebral disc displacement, lumbar region: Secondary | ICD-10-CM | POA: Diagnosis not present

## 2015-01-26 ENCOUNTER — Encounter: Payer: Self-pay | Admitting: Family

## 2015-01-26 DIAGNOSIS — M545 Low back pain, unspecified: Secondary | ICD-10-CM

## 2015-01-26 NOTE — Telephone Encounter (Signed)
Please see mychart message and verify that pt has read.

## 2015-01-27 ENCOUNTER — Ambulatory Visit (INDEPENDENT_AMBULATORY_CARE_PROVIDER_SITE_OTHER): Payer: Medicare Other | Admitting: Family

## 2015-01-27 ENCOUNTER — Telehealth: Payer: Self-pay | Admitting: *Deleted

## 2015-01-27 ENCOUNTER — Encounter: Payer: Self-pay | Admitting: Family

## 2015-01-27 VITALS — BP 120/80 | HR 78 | Temp 98.0°F | Resp 18 | Ht 64.0 in | Wt 170.2 lb

## 2015-01-27 DIAGNOSIS — E785 Hyperlipidemia, unspecified: Secondary | ICD-10-CM | POA: Diagnosis not present

## 2015-01-27 DIAGNOSIS — G4733 Obstructive sleep apnea (adult) (pediatric): Secondary | ICD-10-CM

## 2015-01-27 NOTE — Patient Instructions (Signed)
Your triglycerides are mildly elevated. Please work on avoiding concentrated sweets, and limiting white carbs (rice/bread/pasta/potatoes). Instead substitute whole grain versions with reasonable portions. Please follow up in 6 months.

## 2015-01-27 NOTE — Assessment & Plan Note (Signed)
Discussed high trigs and dietary changes to help reduce trigs.

## 2015-01-27 NOTE — Telephone Encounter (Signed)
Obtained fax # of [phone]. Faxed today's office note and requested OSA download report as below.    Please fax copy to [facility] and request an OSA download report be faxed to [place].

## 2015-01-27 NOTE — Assessment & Plan Note (Signed)
Improved, tolerating CPAP will request download.

## 2015-01-27 NOTE — Progress Notes (Signed)
Pre visit review using our clinic review tool, if applicable. No additional management support is needed unless otherwise documented below in the visit note. 

## 2015-01-27 NOTE — Progress Notes (Signed)
Subjective:    Patient ID: Vanessa Oliver, female    DOB: 03/01/1951, 64 y.o.   MRN: [id number]  HPI  Vanessa Oliver is a 64 yr old female who presents today for follow up of her sleep apnea.    Reports that she uses cpap 8 hours a night every night. CPAP is being supplied by [facility].  She reports improvement in her daytime somnolence since she started the CPAP.  She started CPAP in February and was having episodes of dozing off during the day prior to starting cpap. She reports that she is no longer snoring.  No longer having witnessed apneas. No longer falling asleep unintentionally. Reports improved energy during the day.  Review of Systems    see HPI  Past Medical History  Diagnosis Date  . Wears glasses   . Sinus complaint   . Arthritis   . Hyperlipidemia   . Hiatal hernia   . Schatzki's ring   . GERD (gastroesophageal reflux disease)   . Fibromyalgia   . Anxiety and depression   . IBS (irritable bowel syndrome)   . Fundic gland polyps of stomach, benign   . Diverticulosis   . Esophageal stricture   . Plantar fasciitis   . Rosacea   . TMJ (temporomandibular joint disorder)   . Vasovagal syncope   . Mononucleosis 1975  . Depression   . Allergy   . History of mononucleosis 1975  . Fibromyalgia     History   Social History  . Marital Status: Married    Spouse Name: N/A  . Number of Children: 0  . Years of Education: N/A   Occupational History  . clerical    Social History Main Topics  . Smoking status: Never Smoker   . Smokeless tobacco: Never Used  . Alcohol Use: 0.0 oz/week    0 Standard drinks or equivalent per week     Comment: ocassional  . Drug Use: No  . Sexual Activity: Not on file   Other Topics Concern  . Not on file   Social History Narrative   Works as an Glass blower/designer   Married- husband is 77 years older than her   Former Dance movement psychotherapist up up [employer]   Has cats   Enjoys reading   No children    Past Surgical History    Procedure Laterality Date  . Nissen fundoplication  6712  . Retinal laser procedure Right 02/01/2011    right eye   . Tonsillectomy  1981  . Nasal septum surgery  1970  . Knee arthroscopy Right 1994    x 3  . 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 Carpal tunnel release Bilateral 2004  . Trigger finger release Bilateral 2005  . Abdominal hysterectomy  1997  . Varicose vein surgery Bilateral 2007, 2009    left 2007, right 2009  . Mallet finger Right 2011  . Vitrectomy Right 2012  . Cataract extraction Right 2013    Dr Ellison Hughs  . Carpometacarpal (cmc) fusion of thumb  2015    thumb    Family History  Problem Relation Age of Onset  . Breast cancer Mother   . Arthritis Mother   . Hyperlipidemia Mother   . Hypertension Mother   . Lung cancer Father   . Pancreatic cancer Father   . Skin cancer Father   . Arthritis Father   . Celiac disease Brother   . Hyperlipidemia Maternal Grandfather   .  Heart disease Maternal Grandfather   . Stroke Maternal Grandfather     Allergies  Allergen Reactions  . Cefuroxime Axetil     Hives / "throat swelling"  . Chlorzoxazone Rash and Other (See Comments)    "breathing problems"   . Oxycodone     Hallucinations, rash  . Penicillins Anaphylaxis  . Cataflam [Diclofenac Potassium]     Lip swelling  . Clarithromycin Diarrhea  . Flagyl [Metronidazole Hcl]     ?diarrhea  . Glucosamine Hives  . Guaifenesin & Derivatives     Stomach upset  . Hydrocodone-Acetaminophen     REACTION: rash, tightening of throat  . Ketorolac Tromethamine   . Pentazocine     Involuntary twitching  . Pneumovax [Pneumococcal Polysaccharide Vaccine] Other (See Comments)    Redness/swelling at site, nausea  . Smz-Tmp Ds [Sulfamethoxazole W/Trimethoprim (Co-Trimoxazole)]     ?unknown reaction  . Tramadol     constipation  . Trazodone And Nefazodone Other (See Comments)    Scratchy throat / congestion    Current Outpatient Prescriptions on File  Prior to Visit  Medication Sig Dispense Refill  . aspirin 81 MG tablet Take 81 mg by mouth daily.    . Azelaic Acid (FINACEA) 15 % cream Apply topically 1 day or 1 dose. After skin is thoroughly washed and patted dry, gently but thoroughly massage a thin film of azelaic acid cream into the affected area twice daily, in the morning and evening.     . Calcium Carbonate-Vitamin D (CALTRATE 600+D) 600-400 MG-UNIT per tablet Take 1 tablet by mouth 2 (two) times daily. (Patient taking differently: Take 2 tablets by mouth 2 (two) times daily. )    . cholecalciferol (VITAMIN D) 1000 UNITS tablet Take 1,000 Units by mouth daily.     . diclofenac sodium (VOLTAREN) 1 % GEL Apply 2 g topically 2 (two) times daily as needed. 300 g 1  . DULoxetine (CYMBALTA) 60 MG capsule Take 1 capsule (60 mg total) by mouth daily. 90 capsule 1  . FIBER PO Take 2 capsules by mouth 2 (two) times daily.    . mometasone (NASONEX) 50 MCG/ACT nasal spray Place 2 sprays into the nose daily.    . montelukast (SINGULAIR) 10 MG tablet Take 10 mg by mouth at bedtime.    . Multiple Vitamin (MULTIVITAMIN) tablet Take 1 tablet by mouth daily.    Marland Kitchen omeprazole (PRILOSEC) 40 MG capsule Take 1 capsule (40 mg total) by mouth 2 (two) times daily. 180 capsule 3  . Pitavastatin Calcium (LIVALO) 2 MG TABS Take 1 mg by mouth.     . Probiotic Product (PROBIOTIC DAILY PO) Take 1 tablet by mouth daily.    . vitamin C (ASCORBIC ACID) 500 MG tablet Take 500 mg by mouth daily.    . Vitamin D, Ergocalciferol, (DRISDOL) 50000 UNITS CAPS capsule Take 1 capsule (50,000 Units total) by mouth every 7 (seven) days. 12 capsule 0   No current facility-administered medications on file prior to visit.    BP 120/80 mmHg  Pulse 78  Temp(Src) 98 F (36.7 C) (Oral)  Resp 18  Ht 5\' 4"  (1.626 m)  Wt 170 lb 3.2 oz (77.202 kg)  BMI 29.20 kg/m2  SpO2 98%  LMP 05/14/1996    Objective:   Physical Exam  Constitutional: She is oriented to person, place, and  time. She appears well-developed and well-nourished. No distress.  Neurological: She is alert and oriented to person, place, and time.  Psychiatric: She has a  normal mood and affect. Her behavior is normal. Judgment and thought content normal.          Assessment & Plan:  15 min spent with pt today.  >50% of this time was spent counseling pt on OSA treatment and dietary changes for hypertriglyceridemia.

## 2015-01-30 ENCOUNTER — Other Ambulatory Visit: Payer: Self-pay | Admitting: Neurosurgery

## 2015-01-30 DIAGNOSIS — M5416 Radiculopathy, lumbar region: Secondary | ICD-10-CM | POA: Diagnosis not present

## 2015-01-30 DIAGNOSIS — Z6829 Body mass index (BMI) 29.0-29.9, adult: Secondary | ICD-10-CM | POA: Diagnosis not present

## 2015-02-04 DIAGNOSIS — H18413 Arcus senilis, bilateral: Secondary | ICD-10-CM | POA: Diagnosis not present

## 2015-02-04 DIAGNOSIS — H2513 Age-related nuclear cataract, bilateral: Secondary | ICD-10-CM | POA: Diagnosis not present

## 2015-02-04 DIAGNOSIS — H338 Other retinal detachments: Secondary | ICD-10-CM | POA: Diagnosis not present

## 2015-02-04 DIAGNOSIS — H26491 Other secondary cataract, right eye: Secondary | ICD-10-CM | POA: Diagnosis not present

## 2015-02-04 DIAGNOSIS — Z961 Presence of intraocular lens: Secondary | ICD-10-CM | POA: Diagnosis not present

## 2015-02-06 ENCOUNTER — Encounter (HOSPITAL_COMMUNITY)
Admission: RE | Admit: 2015-02-06 | Discharge: 2015-02-06 | Disposition: A | Discharge status: Home / Self Care | Payer: Medicare Other | Source: Ambulatory Visit | Attending: Neurosurgery | Admitting: Neurosurgery

## 2015-02-06 ENCOUNTER — Encounter (HOSPITAL_COMMUNITY): Payer: Self-pay

## 2015-02-06 DIAGNOSIS — Z79899 Other long term (current) drug therapy: Secondary | ICD-10-CM | POA: Insufficient documentation

## 2015-02-06 DIAGNOSIS — Z01812 Encounter for preprocedural laboratory examination: Secondary | ICD-10-CM | POA: Insufficient documentation

## 2015-02-06 DIAGNOSIS — M47896 Other spondylosis, lumbar region: Secondary | ICD-10-CM | POA: Insufficient documentation

## 2015-02-06 DIAGNOSIS — M4806 Spinal stenosis, lumbar region: Secondary | ICD-10-CM | POA: Diagnosis not present

## 2015-02-06 HISTORY — DX: Unspecified hemorrhoids: K64.9

## 2015-02-06 HISTORY — DX: Personal history of other diseases of the respiratory system: Z87.09

## 2015-02-06 HISTORY — DX: Other chronic pain: G89.29

## 2015-02-06 HISTORY — DX: Effusion, unspecified joint: M25.40

## 2015-02-06 HISTORY — DX: Syncope and collapse: R55

## 2015-02-06 HISTORY — DX: Dorsalgia, unspecified: M54.9

## 2015-02-06 HISTORY — DX: Family history of other specified conditions: Z84.89

## 2015-02-06 HISTORY — DX: Pain in unspecified joint: M25.50

## 2015-02-06 HISTORY — DX: Weakness: R53.1

## 2015-02-06 LAB — CBC WITH DIFFERENTIAL/PLATELET
BASOS PCT: 1 % (ref 0–1)
Basophils Absolute: 0.1 10*3/uL (ref 0.0–0.1)
EOS ABS: 0.2 10*3/uL (ref 0.0–0.7)
Eosinophils Relative: 2 % (ref 0–5)
HCT: 43.7 % (ref 36.0–46.0)
Hemoglobin: 14.7 g/dL (ref 12.0–15.0)
Lymphocytes Relative: 33 % (ref 12–46)
Lymphs Abs: 2.4 10*3/uL (ref 0.7–4.0)
MCH: 31.7 pg (ref 26.0–34.0)
MCHC: 33.6 g/dL (ref 30.0–36.0)
MCV: 94.4 fL (ref 78.0–100.0)
MONO ABS: 0.6 10*3/uL (ref 0.1–1.0)
Monocytes Relative: 9 % (ref 3–12)
NEUTROS PCT: 55 % (ref 43–77)
Neutro Abs: 3.9 10*3/uL (ref 1.7–7.7)
Platelets: 287 10*3/uL (ref 150–400)
RBC: 4.63 MIL/uL (ref 3.87–5.11)
RDW: 13 % (ref 11.5–15.5)
WBC: 7.1 10*3/uL (ref 4.0–10.5)

## 2015-02-06 LAB — BASIC METABOLIC PANEL
ANION GAP: 8 (ref 5–15)
BUN: 19 mg/dL (ref 6–20)
CALCIUM: 9.6 mg/dL (ref 8.9–10.3)
CO2: 27 mmol/L (ref 22–32)
Chloride: 103 mmol/L (ref 101–111)
Creatinine, Ser: 0.71 mg/dL (ref 0.44–1.00)
GFR calc non Af Amer: >60 mL/min (ref >60)
Glucose, Bld: 90 mg/dL (ref 65–99)
Potassium: 4.1 mmol/L (ref 3.5–5.1)
Sodium: 138 mmol/L (ref 135–145)

## 2015-02-06 LAB — SURGICAL PCR SCREEN
MRSA, PCR: NEGATIVE
STAPHYLOCOCCUS AUREUS: NEGATIVE

## 2015-02-06 NOTE — Progress Notes (Addendum)
Cardiologist denies having one  Debbrah Alar NP with [facility]  Echo  Denies ever having one  Stress test done at [facility] in 2009  Heart cath denies ever having one  EKG denies having one in past yr  CXR denies having one in past yr  Sleep study in epic from 2016

## 2015-02-06 NOTE — Pre-Procedure Instructions (Signed)
DAJON ROWE  02/06/2015      [facility] - [address], [place] [address] Phone: [phone] Fax: [place], [address] [place] [place] [address] Phone: [phone] Fax: [phone]    Your procedure is scheduled on Wed, Aug 2 @ 9:30 AM  Report to [facility] Admitting at 7:30 AM  Call this number if you have problems the morning of surgery:  [phone]   Remember:  Do not eat food or drink liquids after midnight.  Take these medicines the morning of surgery with A SIP OF WATER Cymbalta(Duloxetine) and Omeprazole(Prilosec)              Stop taking your Aspirin along with any Vitamins or Herbal Medications. No Goody's,BC's,Aleve,Ibuprofen,or Fish Oil.   Do not wear jewelry, make-up or nail polish.  Do not wear lotions, powders, or perfumes.  You may wear deodorant.  Do not shave 48 hours prior to surgery.    Do not bring valuables to the hospital.  [facility] is not responsible for any belongings or valuables.  Contacts, dentures or bridgework may not be worn into surgery.  Leave your suitcase in the car.  After surgery it may be brought to your room.  For patients admitted to the hospital, discharge time will be determined by your treatment team.  Patients discharged the day of surgery will not be allowed to drive home.    Special instructions:  [facility] - Preparing for Surgery  Before surgery, you can play an important role.  Because skin is not sterile, your skin needs to be as free of germs as possible.  You can reduce the number of germs on you skin by washing with CHG (chlorahexidine gluconate) soap before surgery.  CHG is an antiseptic cleaner which kills germs and bonds with the skin to continue killing germs even after washing.  Please DO NOT use if you have an allergy to CHG  or antibacterial soaps.  If your skin becomes reddened/irritated stop using the CHG and inform your nurse when you arrive at Short Stay.  Do not shave (including legs and underarms) for at least 48 hours prior to the first CHG shower.  You may shave your face.  Please follow these instructions carefully:   1.  Shower with CHG Soap the night before surgery and the                                morning of Surgery.  2.  If you choose to wash your hair, wash your hair first as usual with your       normal shampoo.  3.  After you shampoo, rinse your hair and body thoroughly to remove the                      Shampoo.  4.  Use CHG as you would any other liquid soap.  You can apply chg directly       to the skin and wash gently with scrungie or a clean washcloth.  5.  Apply the CHG Soap to your body ONLY FROM THE NECK DOWN.        Do not use on open wounds or open sores.  Avoid contact with your eyes,       ears, mouth and genitals (private parts).  Wash genitals (private parts)       with your normal soap.  6.  Wash thoroughly, paying special attention to the area where your surgery        will be performed.  7.  Thoroughly rinse your body with warm water from the neck down.  8.  DO NOT shower/wash with your normal soap after using and rinsing off       the CHG Soap.  9.  Pat yourself dry with a clean towel.            10.  Wear clean pajamas.            11.  Place clean sheets on your bed the night of your first shower and do not        sleep with pets.  Day of Surgery  Do not apply any lotions/deoderants the morning of surgery.  Please wear clean clothes to the hospital/surgery center.    Please read over the following fact sheets that you were given. Pain Booklet, Coughing and Deep Breathing, MRSA Information and Surgical Site Infection Prevention

## 2015-02-10 MED ORDER — VANCOMYCIN HCL IN DEXTROSE 1-5 GM/200ML-% IV SOLN
1000.0000 mg | INTRAVENOUS | Status: AC
  Administered 2015-02-11: 1000 mg via INTRAVENOUS
  Filled 2015-02-10: qty 200

## 2015-02-10 MED ORDER — DEXAMETHASONE SODIUM PHOSPHATE 10 MG/ML IJ SOLN
10.0000 mg | INTRAMUSCULAR | Status: AC
  Administered 2015-02-11: 10 mg via INTRAVENOUS
  Filled 2015-02-10: qty 1

## 2015-02-10 NOTE — Anesthesia Preprocedure Evaluation (Addendum)
Anesthesia Evaluation  Patient identified by MRN, date of birth, ID band Patient awake    Reviewed: Allergy & Precautions, NPO status , Patient's Chart, lab work & pertinent test results, reviewed documented beta blocker date and time   Airway Mallampati: II       Dental  (+) Partial Lower, Dental Advisory Given   Pulmonary sleep apnea and Continuous Positive Airway Pressure Ventilation ,  breath sounds clear to auscultation        Cardiovascular negative cardio ROS  Rhythm:Regular     Neuro/Psych  Headaches, Anxiety Depression    GI/Hepatic Neg liver ROS, hiatal hernia, GERD-  Medicated,  Endo/Other  negative endocrine ROS  Renal/GU negative Renal ROS     Musculoskeletal  (+) Fibromyalgia -  Abdominal (+)  Abdomen: soft.    Peds  Hematology 14/44   Anesthesia Other Findings TMJ dislocation in past  Reproductive/Obstetrics                            Anesthesia Physical Anesthesia Plan  ASA: II  Anesthesia Plan: General   Post-op Pain Management:    Induction: Intravenous  Airway Management Planned: Oral ETT and Video Laryngoscope Planned  Additional Equipment:   Intra-op Plan:   Post-operative Plan: Extubation in OR  Informed Consent: I have reviewed the patients History and Physical, chart, labs and discussed the procedure including the risks, benefits and alternatives for the proposed anesthesia with the patient or authorized representative who has indicated his/her understanding and acceptance.     Plan Discussed with:   Anesthesia Plan Comments: (GLIDE 2nd TMJ HX, multimodal pain, no Toradol)        Anesthesia Quick Evaluation

## 2015-02-11 ENCOUNTER — Encounter (HOSPITAL_COMMUNITY)
Admission: RE | Disposition: A | Discharge status: Home / Self Care | Payer: Self-pay | Source: Ambulatory Visit | Attending: Neurosurgery

## 2015-02-11 ENCOUNTER — Ambulatory Visit (HOSPITAL_COMMUNITY): Payer: Medicare Other | Admitting: Anesthesiology

## 2015-02-11 ENCOUNTER — Encounter (HOSPITAL_COMMUNITY): Payer: Self-pay | Admitting: *Deleted

## 2015-02-11 ENCOUNTER — Observation Stay (HOSPITAL_COMMUNITY): Payer: Medicare Other

## 2015-02-11 ENCOUNTER — Ambulatory Visit (HOSPITAL_COMMUNITY)
Admission: RE | Admit: 2015-02-11 | Discharge: 2015-02-11 | Disposition: A | Discharge status: Home / Self Care | Payer: Medicare Other | Source: Ambulatory Visit | Attending: Neurosurgery | Admitting: Neurosurgery

## 2015-02-11 DIAGNOSIS — E785 Hyperlipidemia, unspecified: Secondary | ICD-10-CM | POA: Insufficient documentation

## 2015-02-11 DIAGNOSIS — M48062 Spinal stenosis, lumbar region with neurogenic claudication: Secondary | ICD-10-CM

## 2015-02-11 DIAGNOSIS — Z7982 Long term (current) use of aspirin: Secondary | ICD-10-CM | POA: Diagnosis not present

## 2015-02-11 DIAGNOSIS — M797 Fibromyalgia: Secondary | ICD-10-CM | POA: Insufficient documentation

## 2015-02-11 DIAGNOSIS — M549 Dorsalgia, unspecified: Secondary | ICD-10-CM

## 2015-02-11 DIAGNOSIS — Z9889 Other specified postprocedural states: Secondary | ICD-10-CM | POA: Diagnosis not present

## 2015-02-11 DIAGNOSIS — M47816 Spondylosis without myelopathy or radiculopathy, lumbar region: Secondary | ICD-10-CM | POA: Diagnosis not present

## 2015-02-11 DIAGNOSIS — M4806 Spinal stenosis, lumbar region: Secondary | ICD-10-CM | POA: Insufficient documentation

## 2015-02-11 DIAGNOSIS — Z79899 Other long term (current) drug therapy: Secondary | ICD-10-CM | POA: Diagnosis not present

## 2015-02-11 DIAGNOSIS — K219 Gastro-esophageal reflux disease without esophagitis: Secondary | ICD-10-CM | POA: Diagnosis not present

## 2015-02-11 DIAGNOSIS — M48061 Spinal stenosis, lumbar region without neurogenic claudication: Secondary | ICD-10-CM | POA: Diagnosis present

## 2015-02-11 DIAGNOSIS — M1990 Unspecified osteoarthritis, unspecified site: Secondary | ICD-10-CM | POA: Diagnosis not present

## 2015-02-11 HISTORY — PX: LUMBAR LAMINECTOMY/DECOMPRESSION MICRODISCECTOMY: SHX5026

## 2015-02-11 SURGERY — LUMBAR LAMINECTOMY/DECOMPRESSION MICRODISCECTOMY 1 LEVEL
Anesthesia: General | Site: Back | Laterality: Right

## 2015-02-11 MED ORDER — PHENOL 1.4 % MT LIQD: 1.0000 | OROMUCOSAL | Status: DC | PRN

## 2015-02-11 MED ORDER — ONDANSETRON HCL 4 MG/2ML IJ SOLN
INTRAMUSCULAR | Status: DC | PRN
  Administered 2015-02-11: 4 mg via INTRAVENOUS

## 2015-02-11 MED ORDER — MENTHOL 3 MG MT LOZG
1.0000 | LOZENGE | OROMUCOSAL | Status: DC | PRN
Start: 2015-02-11 — End: 2015-02-11

## 2015-02-11 MED ORDER — PRAVASTATIN SODIUM 40 MG PO TABS
40.0000 mg | ORAL_TABLET | Freq: Every day | ORAL | Status: DC
  Filled 2015-02-11: qty 1

## 2015-02-11 MED ORDER — MONTELUKAST SODIUM 10 MG PO TABS
10.0000 mg | ORAL_TABLET | Freq: Every day | ORAL | Status: DC
  Filled 2015-02-11: qty 1

## 2015-02-11 MED ORDER — HYDROMORPHONE HCL 1 MG/ML IJ SOLN: 0.5000 mg | INTRAMUSCULAR | Status: DC | PRN

## 2015-02-11 MED ORDER — PHENYLEPHRINE 40 MCG/ML (10ML) SYRINGE FOR IV PUSH (FOR BLOOD PRESSURE SUPPORT)
PREFILLED_SYRINGE | INTRAVENOUS | Status: AC
  Filled 2015-02-11: qty 10

## 2015-02-11 MED ORDER — LIDOCAINE HCL (CARDIAC) 20 MG/ML IV SOLN
INTRAVENOUS | Status: DC | PRN
  Administered 2015-02-11: 50 mg via INTRAVENOUS

## 2015-02-11 MED ORDER — NEOSTIGMINE METHYLSULFATE 10 MG/10ML IV SOLN
INTRAVENOUS | Status: DC | PRN
Start: 2015-02-11 — End: 2015-02-11
  Administered 2015-02-11: 4 mg via INTRAVENOUS

## 2015-02-11 MED ORDER — NEOSTIGMINE METHYLSULFATE 10 MG/10ML IV SOLN
INTRAVENOUS | Status: AC
  Filled 2015-02-11: qty 1

## 2015-02-11 MED ORDER — FENTANYL CITRATE (PF) 100 MCG/2ML IJ SOLN
INTRAMUSCULAR | Status: DC | PRN
Start: 2015-02-11 — End: 2015-02-11
  Administered 2015-02-11: 150 ug via INTRAVENOUS
  Administered 2015-02-11: 100 ug via INTRAVENOUS

## 2015-02-11 MED ORDER — 0.9 % SODIUM CHLORIDE (POUR BTL) OPTIME
TOPICAL | Status: DC | PRN
  Administered 2015-02-11: 1000 mL

## 2015-02-11 MED ORDER — ONDANSETRON HCL 4 MG/2ML IJ SOLN: 4.0000 mg | INTRAMUSCULAR | Status: DC | PRN

## 2015-02-11 MED ORDER — FENTANYL CITRATE (PF) 100 MCG/2ML IJ SOLN: 25.0000 ug | INTRAMUSCULAR | Status: DC | PRN

## 2015-02-11 MED ORDER — ROCURONIUM BROMIDE 50 MG/5ML IV SOLN
INTRAVENOUS | Status: AC
  Filled 2015-02-11: qty 1

## 2015-02-11 MED ORDER — GLYCOPYRROLATE 0.2 MG/ML IJ SOLN
INTRAMUSCULAR | Status: AC
  Filled 2015-02-11: qty 3

## 2015-02-11 MED ORDER — PROMETHAZINE HCL 25 MG/ML IJ SOLN: 6.2500 mg | INTRAMUSCULAR | Status: DC | PRN

## 2015-02-11 MED ORDER — MEPERIDINE HCL 25 MG/ML IJ SOLN: 6.2500 mg | INTRAMUSCULAR | Status: DC | PRN

## 2015-02-11 MED ORDER — SUCCINYLCHOLINE CHLORIDE 20 MG/ML IJ SOLN
INTRAMUSCULAR | Status: AC
  Filled 2015-02-11: qty 1

## 2015-02-11 MED ORDER — DULOXETINE HCL 60 MG PO CPEP: 60.0000 mg | ORAL_CAPSULE | Freq: Every day | ORAL | Status: DC

## 2015-02-11 MED ORDER — VANCOMYCIN HCL IN DEXTROSE 1-5 GM/200ML-% IV SOLN
1000.0000 mg | Freq: Once | INTRAVENOUS | Status: DC
  Filled 2015-02-11: qty 200

## 2015-02-11 MED ORDER — PANTOPRAZOLE SODIUM 40 MG PO TBEC: 80.0000 mg | DELAYED_RELEASE_TABLET | Freq: Every day | ORAL | Status: DC

## 2015-02-11 MED ORDER — BUPIVACAINE HCL (PF) 0.25 % IJ SOLN
INTRAMUSCULAR | Status: DC | PRN
  Administered 2015-02-11: 20 mL

## 2015-02-11 MED ORDER — OXYCODONE-ACETAMINOPHEN 5-325 MG PO TABS: 1.0000 | ORAL_TABLET | ORAL | Status: DC | PRN

## 2015-02-11 MED ORDER — DIPHENHYDRAMINE HCL 50 MG/ML IJ SOLN
INTRAMUSCULAR | Status: DC | PRN
  Administered 2015-02-11: 25 mg via INTRAVENOUS

## 2015-02-11 MED ORDER — LIDOCAINE HCL (CARDIAC) 20 MG/ML IV SOLN
INTRAVENOUS | Status: AC
  Filled 2015-02-11: qty 10

## 2015-02-11 MED ORDER — FENTANYL CITRATE (PF) 250 MCG/5ML IJ SOLN
INTRAMUSCULAR | Status: AC
  Filled 2015-02-11: qty 5

## 2015-02-11 MED ORDER — HEMOSTATIC AGENTS (NO CHARGE) OPTIME
TOPICAL | Status: DC | PRN
  Administered 2015-02-11: 1 via TOPICAL

## 2015-02-11 MED ORDER — CYCLOBENZAPRINE HCL 10 MG PO TABS: 10.0000 mg | ORAL_TABLET | Freq: Three times a day (TID) | ORAL | Status: DC | PRN

## 2015-02-11 MED ORDER — THROMBIN 5000 UNITS EX SOLR
CUTANEOUS | Status: DC | PRN
  Administered 2015-02-11 (×2): 5000 [IU] via TOPICAL

## 2015-02-11 MED ORDER — BACITRACIN 50000 UNITS IM SOLR
INTRAMUSCULAR | Status: DC | PRN
  Administered 2015-02-11: 500 mL

## 2015-02-11 MED ORDER — ASPIRIN 81 MG PO CHEW: 81.0000 mg | CHEWABLE_TABLET | Freq: Every day | ORAL | Status: DC

## 2015-02-11 MED ORDER — STERILE WATER FOR INJECTION IJ SOLN
INTRAMUSCULAR | Status: AC
  Filled 2015-02-11: qty 10

## 2015-02-11 MED ORDER — SODIUM CHLORIDE 0.9 % IJ SOLN: 3.0000 mL | Freq: Two times a day (BID) | INTRAMUSCULAR | Status: DC

## 2015-02-11 MED ORDER — ACETAMINOPHEN 325 MG PO TABS: 650.0000 mg | ORAL_TABLET | ORAL | Status: DC | PRN

## 2015-02-11 MED ORDER — GLYCOPYRROLATE 0.2 MG/ML IJ SOLN
INTRAMUSCULAR | Status: DC | PRN
  Administered 2015-02-11: .7 mg via INTRAVENOUS

## 2015-02-11 MED ORDER — ROCURONIUM BROMIDE 100 MG/10ML IV SOLN
INTRAVENOUS | Status: DC | PRN
  Administered 2015-02-11: 40 mg via INTRAVENOUS

## 2015-02-11 MED ORDER — LACTATED RINGERS IV SOLN
INTRAVENOUS | Status: DC | PRN
  Administered 2015-02-11 (×2): via INTRAVENOUS

## 2015-02-11 MED ORDER — PROPOFOL 10 MG/ML IV BOLUS
INTRAVENOUS | Status: AC
  Filled 2015-02-11: qty 20

## 2015-02-11 MED ORDER — ACETAMINOPHEN 10 MG/ML IV SOLN
INTRAVENOUS | Status: DC | PRN
  Administered 2015-02-11: 1000 mg via INTRAVENOUS

## 2015-02-11 MED ORDER — HYDROCODONE-ACETAMINOPHEN 5-325 MG PO TABS: 1.0000 | ORAL_TABLET | ORAL | Status: DC | PRN

## 2015-02-11 MED ORDER — PROPOFOL 10 MG/ML IV BOLUS
INTRAVENOUS | Status: DC | PRN
  Administered 2015-02-11: 160 mg via INTRAVENOUS

## 2015-02-11 MED ORDER — ACETAMINOPHEN 650 MG RE SUPP: 650.0000 mg | RECTAL | Status: DC | PRN

## 2015-02-11 MED ORDER — DICLOFENAC SODIUM 1 % TD GEL
2.0000 g | Freq: Two times a day (BID) | TRANSDERMAL | Status: DC | PRN
  Filled 2015-02-11: qty 100

## 2015-02-11 MED ORDER — ACETAMINOPHEN 10 MG/ML IV SOLN
INTRAVENOUS | Status: AC
  Filled 2015-02-11: qty 100

## 2015-02-11 MED ORDER — SODIUM CHLORIDE 0.9 % IJ SOLN: 3.0000 mL | INTRAMUSCULAR | Status: DC | PRN

## 2015-02-11 MED ORDER — ONDANSETRON HCL 4 MG/2ML IJ SOLN
INTRAMUSCULAR | Status: AC
  Filled 2015-02-11: qty 2

## 2015-02-11 MED ORDER — LACTATED RINGERS IV SOLN
INTRAVENOUS | Status: DC
  Administered 2015-02-11: 08:00:00 via INTRAVENOUS

## 2015-02-11 MED ORDER — MIDAZOLAM HCL 2 MG/2ML IJ SOLN
INTRAMUSCULAR | Status: AC
  Filled 2015-02-11: qty 4

## 2015-02-11 SURGICAL SUPPLY — 49 items
APL SKNCLS STERI-STRIP NONHPOA (GAUZE/BANDAGES/DRESSINGS) ×1
BAG DECANTER FOR FLEXI CONT (MISCELLANEOUS) ×3 IMPLANT
BENZOIN TINCTURE PRP APPL 2/3 (GAUZE/BANDAGES/DRESSINGS) ×3 IMPLANT
BLADE CLIPPER SURG (BLADE) IMPLANT
BRUSH SCRUB EZ PLAIN DRY (MISCELLANEOUS) ×3 IMPLANT
BUR CUTTER 7.0 ROUND (BURR) ×3 IMPLANT
CANISTER SUCT 3000ML PPV (MISCELLANEOUS) ×3 IMPLANT
CLOSURE WOUND 1/2 X4 (GAUZE/BANDAGES/DRESSINGS) ×1
CONT SPEC 4OZ CLIKSEAL STRL BL (MISCELLANEOUS) ×3 IMPLANT
DECANTER SPIKE VIAL GLASS SM (MISCELLANEOUS) ×3 IMPLANT
DRAPE LAPAROTOMY 100X72X124 (DRAPES) ×3 IMPLANT
DRAPE MICROSCOPE LEICA (MISCELLANEOUS) ×3 IMPLANT
DRAPE POUCH INSTRU U-SHP 10X18 (DRAPES) ×3 IMPLANT
DRAPE PROXIMA HALF (DRAPES) IMPLANT
DRAPE SURG 17X23 STRL (DRAPES) ×6 IMPLANT
DRSG OPSITE POSTOP 3X4 (GAUZE/BANDAGES/DRESSINGS) ×2 IMPLANT
DURAPREP 26ML APPLICATOR (WOUND CARE) ×3 IMPLANT
ELECT REM PT RETURN 9FT ADLT (ELECTROSURGICAL) ×3
ELECTRODE REM PT RTRN 9FT ADLT (ELECTROSURGICAL) ×1 IMPLANT
GAUZE SPONGE 4X4 12PLY STRL (GAUZE/BANDAGES/DRESSINGS) ×3 IMPLANT
GAUZE SPONGE 4X4 16PLY XRAY LF (GAUZE/BANDAGES/DRESSINGS) IMPLANT
GLOVE ECLIPSE 9.0 STRL (GLOVE) ×3 IMPLANT
GLOVE EXAM NITRILE LRG STRL (GLOVE) IMPLANT
GLOVE EXAM NITRILE MD LF STRL (GLOVE) IMPLANT
GLOVE EXAM NITRILE XL STR (GLOVE) IMPLANT
GLOVE EXAM NITRILE XS STR PU (GLOVE) IMPLANT
GOWN STRL REUS W/ TWL LRG LVL3 (GOWN DISPOSABLE) IMPLANT
GOWN STRL REUS W/ TWL XL LVL3 (GOWN DISPOSABLE) ×1 IMPLANT
GOWN STRL REUS W/TWL 2XL LVL3 (GOWN DISPOSABLE) IMPLANT
GOWN STRL REUS W/TWL LRG LVL3 (GOWN DISPOSABLE)
GOWN STRL REUS W/TWL XL LVL3 (GOWN DISPOSABLE) ×3
KIT BASIN OR (CUSTOM PROCEDURE TRAY) ×3 IMPLANT
KIT ROOM TURNOVER OR (KITS) ×3 IMPLANT
LIQUID BAND (GAUZE/BANDAGES/DRESSINGS) ×3 IMPLANT
NEEDLE HYPO 22GX1.5 SAFETY (NEEDLE) ×3 IMPLANT
NEEDLE SPNL 22GX3.5 QUINCKE BK (NEEDLE) ×3 IMPLANT
NS IRRIG 1000ML POUR BTL (IV SOLUTION) ×3 IMPLANT
PACK LAMINECTOMY NEURO (CUSTOM PROCEDURE TRAY) ×3 IMPLANT
PAD ARMBOARD 7.5X6 YLW CONV (MISCELLANEOUS) ×9 IMPLANT
RUBBERBAND STERILE (MISCELLANEOUS) ×6 IMPLANT
SPONGE SURGIFOAM ABS GEL SZ50 (HEMOSTASIS) ×3 IMPLANT
STRIP CLOSURE SKIN 1/2X4 (GAUZE/BANDAGES/DRESSINGS) ×2 IMPLANT
SUT VIC AB 2-0 CT1 18 (SUTURE) ×3 IMPLANT
SUT VIC AB 3-0 SH 8-18 (SUTURE) ×3 IMPLANT
SYR 20ML ECCENTRIC (SYRINGE) ×3 IMPLANT
TAPE STRIPS DRAPE STRL (GAUZE/BANDAGES/DRESSINGS) ×2 IMPLANT
TOWEL OR 17X24 6PK STRL BLUE (TOWEL DISPOSABLE) ×3 IMPLANT
TOWEL OR 17X26 10 PK STRL BLUE (TOWEL DISPOSABLE) ×3 IMPLANT
WATER STERILE IRR 1000ML POUR (IV SOLUTION) ×3 IMPLANT

## 2015-02-11 NOTE — Discharge Instructions (Signed)

## 2015-02-11 NOTE — Brief Op Note (Signed)
02/11/2015  10:49 AM  PATIENT:  Vanessa Oliver  64 y.o. female  PRE-OPERATIVE DIAGNOSIS:  Stenosis  POST-OPERATIVE DIAGNOSIS:  Stenosis  PROCEDURE:  Procedure(s) with comments: Laminectomy and Foraminotomy - Right - L3-L4 (Right) - Laminectomy and Foraminotomy - Right - L3-L4  SURGEON:  Surgeon(s) and Role:    * Earnie Larsson, MD - Primary    * Karie Chimera, MD - Assisting  PHYSICIAN ASSISTANT:   ASSISTANTS:    ANESTHESIA:   general  EBL:     BLOOD ADMINISTERED:none  DRAINS: none   LOCAL MEDICATIONS USED:  MARCAINE     SPECIMEN:  No Specimen  DISPOSITION OF SPECIMEN:  N/A  COUNTS:  YES  TOURNIQUET:  * No tourniquets in log *  DICTATION: .Dragon Dictation  PLAN OF CARE: Admit for overnight observation  PATIENT DISPOSITION:  PACU - hemodynamically stable.   Delay start of Pharmacological VTE agent (>24hrs) due to surgical blood loss or risk of bleeding: yes

## 2015-02-11 NOTE — Plan of Care (Signed)
Problem: Consults Goal: Diagnosis - Spinal Surgery Outcome: Completed/Met Date Met:  02/11/15 Lumbar Laminectomy (Complex)     

## 2015-02-11 NOTE — H&P (Addendum)
Vanessa Oliver is an 64 y.o. female.   Chief Complaint: Back and right leg pain HPI: 64 year old female with back and right lower extremity pain aggravated by standing and walking and failing conservative management. Workup demonstrates evidence of spondylosis and stenosis at L3-4 on the right side. Patient has failed conservative management and presents now for decompressive surgery.  Past Medical History  Diagnosis Date  . Arthritis   . Hyperlipidemia   . Hiatal hernia   . Fibromyalgia   . IBS (irritable bowel syndrome)   . Esophageal stricture   . Plantar fasciitis   . Rosacea   . TMJ (temporomandibular joint disorder)   . Vasovagal syncope   . Allergy     takes Singulair at night  . Fibromyalgia   . Depression     takes CYmbalta daily  . GERD (gastroesophageal reflux disease)     takes Omeprazole daily  . Family history of adverse reaction to anesthesia     oldest brother had trouble with anesthesia a long time ago but can't recall what  . History of bronchitis   . Vasovagal episode back in the 80's  . Weakness     right leg  . Joint pain   . Chronic back pain     stenosis  . Joint swelling   . Hemorrhoids     Past Surgical History  Procedure Laterality Date  . Nissen fundoplication  6948  . Retinal laser procedure Right 02/01/2011    right eye   . Nasal septum surgery  1970  . Knee arthroscopy Right 1994    x 3  . 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 Carpal tunnel release Bilateral 2004  . Trigger finger release Bilateral 2005  . Abdominal hysterectomy  1997  . Varicose vein surgery Bilateral 2007, 2009    left 2007, right 2009  . Mallet finger Right 2011  . Vitrectomy Right 2012  . Cataract extraction Right 2013    Dr Ellison Hughs  . Carpometacarpal (cmc) fusion of thumb  2015    thumb  . Tonsillectomy  1981  . Nissen fundoplication    . Colonoscopy    . Colonoscopy with esophagogastroduodenoscopy (egd) and esophageal dilation (ed)    . Eye  surgery      scar tissue remove    Family History  Problem Relation Age of Onset  . Breast cancer Mother   . Arthritis Mother   . Hyperlipidemia Mother   . Hypertension Mother   . Lung cancer Father   . Pancreatic cancer Father   . Skin cancer Father   . Arthritis Father   . Celiac disease Brother   . Hyperlipidemia Maternal Grandfather   . Heart disease Maternal Grandfather   . Stroke Maternal Grandfather    Social History:  reports that she has never smoked. She has never used smokeless tobacco. She reports that she drinks alcohol. She reports that she does not use illicit drugs.  Allergies:  Allergies  Allergen Reactions  . Cefuroxime Axetil     Hives / "throat swelling"  . Chlorzoxazone Rash and Other (See Comments)    "breathing problems"   . Oxycodone     Hallucinations, rash  . Penicillins Anaphylaxis  . Adhesive [Tape]     rash  . Cataflam [Diclofenac Potassium]     Lip swelling  . Clarithromycin Diarrhea  . Flagyl [Metronidazole Hcl]     ?diarrhea  . Glucosamine Hives  . Guaifenesin & Derivatives  Stomach upset  . Hydrocodone-Acetaminophen     REACTION: rash, tightening of throat  . Ketorolac Tromethamine   . Pentazocine     Involuntary twitching  . Pneumovax [Pneumococcal Polysaccharide Vaccine] Other (See Comments)    Redness/swelling at site, nausea  . Smz-Tmp Ds [Sulfamethoxazole W/Trimethoprim (Co-Trimoxazole)]     ?unknown reaction  . Tramadol     constipation  . Trazodone And Nefazodone Other (See Comments)    Scratchy throat / congestion    Medications Prior to Admission  Medication Sig Dispense Refill  . aspirin 81 MG tablet Take 81 mg by mouth daily.    . Azelaic Acid (FINACEA) 15 % cream Apply topically 1 day or 1 dose. After skin is thoroughly washed and patted dry, gently but thoroughly massage a thin film of azelaic acid cream into the affected area twice daily, in the morning and evening.     . benzoyl peroxide 5 % gel Apply 1  application topically daily.     . Calcium Carbonate-Vitamin D (CALTRATE 600+D) 600-400 MG-UNIT per tablet Take 1 tablet by mouth 2 (two) times daily. (Patient taking differently: Take 2 tablets by mouth 2 (two) times daily. )    . cholecalciferol (VITAMIN D) 1000 UNITS tablet Take 1,000 Units by mouth daily.     . diclofenac sodium (VOLTAREN) 1 % GEL Apply 2 g topically 2 (two) times daily as needed. 300 g 1  . Difluprednate (DUREZOL) 0.05 % EMUL Place 1 drop into the right eye 4 (four) times daily. Pt will be finished 02/10/15    . DULoxetine (CYMBALTA) 60 MG capsule Take 1 capsule (60 mg total) by mouth daily. 90 capsule 1  . FIBER PO Take 2 capsules by mouth 2 (two) times daily.    . montelukast (SINGULAIR) 10 MG tablet Take 10 mg by mouth at bedtime.    . Multiple Vitamin (MULTIVITAMIN) tablet Take 1 tablet by mouth daily.    Marland Kitchen omeprazole (PRILOSEC) 40 MG capsule Take 1 capsule (40 mg total) by mouth 2 (two) times daily. (Patient taking differently: Take 40 mg by mouth daily. ) 180 capsule 3  . Pitavastatin Calcium (LIVALO) 2 MG TABS Take 1 mg by mouth.     . Probiotic Product (PROBIOTIC DAILY PO) Take 1 tablet by mouth daily.    . vitamin C (ASCORBIC ACID) 500 MG tablet Take 500 mg by mouth daily.    . Vitamin D, Ergocalciferol, (DRISDOL) 50000 UNITS CAPS capsule Take 1 capsule (50,000 Units total) by mouth every 7 (seven) days. 12 capsule 0    No results found for this or any previous visit (from the past 48 hour(s)). No results found.    Blood pressure 157/69, pulse 74, temperature 98.2 F (36.8 C), resp. rate 18, height 5\' 4"  (1.626 m), weight 77.111 kg (170 lb), last menstrual period 05/14/1996, SpO2 100 %.  The patient is awake and alert. She is oriented and appropriate. Speech is fluent. Judgment and insight are intact. Cranial nerve function is normal. Motor and sensory function extremities intact. Straight raising is negative bilaterally. Reflexes are hypoactive in both lower  extremities but symmetric. No evidence of long track signs. Gait is somewhat antalgic. Posture is moderately flexed. Examination head ears eyes or there is unremarkable. Chest and abdomen are benign. Extremities are free from injury or deformity. 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 Plan right L3-4 decompressive laminotomies and foraminotomies. Risks and benefits of been explained. Patient wishes to proceed.   Jennafer Gladue A  02/11/2015, 9:32 AM

## 2015-02-11 NOTE — Transfer of Care (Signed)
Immediate Anesthesia Transfer of Care Note  Patient: Vanessa Oliver  Procedure(s) Performed: Procedure(s) with comments: Laminectomy and Foraminotomy - Right - L3-L4 (Right) - Laminectomy and Foraminotomy - Right - L3-L4  Patient Location: PACU  Anesthesia Type:General  Level of Consciousness: awake, alert  and oriented  Airway & Oxygen Therapy: Patient Spontanous Breathing and Patient connected to nasal cannula oxygen  Post-op Assessment: Report given to RN and Post -op Vital signs reviewed and stable  Post vital signs: Reviewed and stable  Last Vitals:  Filed Vitals:   02/11/15 1110  BP:   Pulse:   Temp: 36.4 C  Resp:     Complications: No apparent anesthesia complications

## 2015-02-11 NOTE — Progress Notes (Signed)
Discharge instructions and education given to patient and verbalized understanding. Patient ambulating and voiding with no problem. No redness, no swelling and no drainage noted, patient describes pain as mild soreness on incision site.

## 2015-02-11 NOTE — Anesthesia Procedure Notes (Signed)
Procedure Name: Intubation Date/Time: 02/11/2015 9:53 AM Performed by: Eligha Bridegroom Pre-anesthesia Checklist: Emergency Drugs available, Patient identified, Timeout performed, Suction available and Patient being monitored Patient Re-evaluated:Patient Re-evaluated prior to inductionOxygen Delivery Method: Circle system utilized Preoxygenation: Pre-oxygenation with 100% oxygen Intubation Type: IV induction Ventilation: Mask ventilation without difficulty Laryngoscope Size: Glidescope and 3 Grade View: Grade I Tube type: Oral Tube size: 7.0 mm Number of attempts: 1 Airway Equipment and Method: Stylet,  LTA kit utilized and Video-laryngoscopy Placement Confirmation: ETT inserted through vocal cords under direct vision,  positive ETCO2 and breath sounds checked- equal and bilateral Secured at: 21 cm Tube secured with: Tape Dental Injury: Teeth and Oropharynx as per pre-operative assessment

## 2015-02-11 NOTE — Op Note (Signed)
Date of procedure: 02/11/2015  Date of dictation: Same  Service: Neurosurgery  Preoperative diagnosis: Right L3-4 spondylosis with stenosis and neurogenic claudication  Postoperative diagnosis: Same  Procedure Name: Right L3-4 decompressive laminotomy with a right L3 and L4 decompressive foraminotomies  Surgeon:Tomicka Lover A.Shahed Yeoman, M.D.  Asst. 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 Patient has failed conservative management. Workup demonstrates evidence of disc degeneration spondylosis and stenosis on the right at L3-4. Patient presents now for decompressive surgery in hopes of improving her symptoms.  Operative note: After induction anesthesia, patient position prone onto Wilson frame and a properly padded. Lumbar region prepped and draped. Incision made overlying L3-4. Dissection performed on the right side. Retractor placed. X-ray taken. Level confirmed. Laminotomy performed using high-speed drill and Kerrison rongeurs. Underlying thecal sac and L4 nerve root identified. Foraminotomies completed on the course the exiting L3 and L4 nerve roots. Very thorough decompression was achieved. No evidence of injury to thecal sac or nerve roots. Disc space was then explored and found to be free of any evidence of herniation. Wound is then irrigated out like solution. Gelfoam was placed topically for hemostasis. Microscope and retractor system were removed. Hemostasis muscle to 5 chart. Wounds and close in layers with phaco sutures. Steri-Strips triggers were applied. No apparent complications. Patient tolerated the procedure well and she returns to the recovery room postop.

## 2015-02-11 NOTE — Anesthesia Postprocedure Evaluation (Signed)
  Anesthesia Post-op Note  Patient: Vanessa Oliver  Procedure(s) Performed: Procedure(s) with comments: Laminectomy and Foraminotomy - Right - L3-L4 (Right) - Laminectomy and Foraminotomy - Right - L3-L4  Patient Location: PACU  Anesthesia Type:General  Level of Consciousness: awake and alert   Airway and Oxygen Therapy: Patient Spontanous Breathing  Post-op Pain: mild  Post-op Assessment: Post-op Vital signs reviewed, Patient's Cardiovascular Status Stable, Respiratory Function Stable, Patent Airway and No signs of Nausea or vomiting              Post-op Vital Signs: Reviewed and stable  Last Vitals:  Filed Vitals:   02/11/15 1145  BP: 146/77  Pulse: 85  Temp:   Resp: 21    Complications: No apparent anesthesia complications

## 2015-02-11 NOTE — Discharge Summary (Signed)
Physician Discharge Summary  Patient ID: Vanessa Oliver MRN: [id number] DOB/AGE: 1950/09/01 64 y.o.  Admit date: 02/11/2015 Discharge date: 02/11/2015  Admission Diagnoses:  Discharge Diagnoses:  Principal Problem:   Spinal stenosis, lumbar region, with neurogenic claudication Active Problems:   Lumbar stenosis   Discharged Condition: good  Hospital Course: The patient was admitted to the hospital where she underwent an uncomplicated right-sided J8-8 decompressive laminotomy and foraminotomies. Postoperatively she is doing very well. Back and lower extremity pain much improved. Standing and walking without difficulty. Ready for discharge home.  Consults:   Significant Diagnostic Studies:   Treatments:   Discharge Exam: Blood pressure 112/51, pulse 82, temperature 97.8 F (36.6 C), temperature source Oral, resp. rate 18, height 5\' 4"  (1.626 m), weight 77.111 kg (170 lb), last menstrual period 05/14/1996, SpO2 95 %. Awake and alert. Oriented and appropriate. Motor and sensory function intact. Wound clean and dry. Chest and abdomen benign.  Disposition: 01-Home or Self Care     Medication List    TAKE these medications        aspirin 81 MG tablet  Take 81 mg by mouth daily.     benzoyl peroxide 5 % gel  Apply 1 application topically daily.     Calcium Carbonate-Vitamin D 600-400 MG-UNIT per tablet  Commonly known as:  CALTRATE 600+D  Take 1 tablet by mouth 2 (two) times daily.     cholecalciferol 1000 UNITS tablet  Commonly known as:  VITAMIN D  Take 1,000 Units by mouth daily.     diclofenac sodium 1 % Gel  Commonly known as:  VOLTAREN  Apply 2 g topically 2 (two) times daily as needed.     DULoxetine 60 MG capsule  Commonly known as:  CYMBALTA  Take 1 capsule (60 mg total) by mouth daily.     DUREZOL 0.05 % Emul  Generic drug:  Difluprednate  Place 1 drop into the right eye 4 (four) times daily. Pt will be finished 02/10/15     FIBER PO  Take 2 capsules  by mouth 2 (two) times daily.     FINACEA 15 % cream  Generic drug:  Azelaic Acid  Apply topically 1 day or 1 dose. After skin is thoroughly washed and patted dry, gently but thoroughly massage a thin film of azelaic acid cream into the affected area twice daily, in the morning and evening.     LIVALO 2 MG Tabs  Generic drug:  Pitavastatin Calcium  Take 1 mg by mouth.     montelukast 10 MG tablet  Commonly known as:  SINGULAIR  Take 10 mg by mouth at bedtime.     multivitamin tablet  Take 1 tablet by mouth daily.     omeprazole 40 MG capsule  Commonly known as:  PRILOSEC  Take 1 capsule (40 mg total) by mouth 2 (two) times daily.     PROBIOTIC DAILY PO  Take 1 tablet by mouth daily.     vitamin C 500 MG tablet  Commonly known as:  ASCORBIC ACID  Take 500 mg by mouth daily.     Vitamin D (Ergocalciferol) 50000 UNITS Caps capsule  Commonly known as:  DRISDOL  Take 1 capsule (50,000 Units total) by mouth every 7 (seven) days.           Follow-up Information    Follow up with Charlie Pitter, MD.   Specialty:  Neurosurgery   Contact information:   [address] [place] [address]  [phone]       Signed: Earnie Larsson A 02/11/2015, 7:05 PM

## 2015-02-12 ENCOUNTER — Encounter (HOSPITAL_COMMUNITY): Payer: Self-pay | Admitting: Neurosurgery

## 2015-03-12 DIAGNOSIS — M2021 Hallux rigidus, right foot: Secondary | ICD-10-CM | POA: Diagnosis not present

## 2015-03-12 DIAGNOSIS — M2011 Hallux valgus (acquired), right foot: Secondary | ICD-10-CM | POA: Diagnosis not present

## 2015-03-12 DIAGNOSIS — S8992XD Unspecified injury of left lower leg, subsequent encounter: Secondary | ICD-10-CM | POA: Diagnosis not present

## 2015-03-14 ENCOUNTER — Ambulatory Visit (INDEPENDENT_AMBULATORY_CARE_PROVIDER_SITE_OTHER): Payer: Medicare Other | Admitting: Family

## 2015-03-14 ENCOUNTER — Encounter: Payer: Self-pay | Admitting: Family

## 2015-03-14 VITALS — BP 138/82 | HR 77 | Temp 98.0°F | Resp 16 | Ht 64.0 in | Wt 172.0 lb

## 2015-03-14 DIAGNOSIS — Z23 Encounter for immunization: Secondary | ICD-10-CM | POA: Diagnosis not present

## 2015-03-14 DIAGNOSIS — Z1231 Encounter for screening mammogram for malignant neoplasm of breast: Secondary | ICD-10-CM | POA: Diagnosis not present

## 2015-03-14 DIAGNOSIS — T7840XA Allergy, unspecified, initial encounter: Secondary | ICD-10-CM | POA: Diagnosis not present

## 2015-03-14 DIAGNOSIS — Z0000 Encounter for general adult medical examination without abnormal findings: Secondary | ICD-10-CM

## 2015-03-14 DIAGNOSIS — Z1159 Encounter for screening for other viral diseases: Secondary | ICD-10-CM

## 2015-03-14 DIAGNOSIS — L509 Urticaria, unspecified: Secondary | ICD-10-CM | POA: Insufficient documentation

## 2015-03-14 LAB — HEPATITIS C ANTIBODY: HCV AB: NEGATIVE

## 2015-03-14 MED ORDER — PREDNISONE 10 MG PO TABS: ORAL_TABLET | ORAL | Status: DC

## 2015-03-14 MED ORDER — DOXYCYCLINE HYCLATE 100 MG PO TABS: 100.0000 mg | ORAL_TABLET | Freq: Two times a day (BID) | ORAL | Status: DC

## 2015-03-14 NOTE — Progress Notes (Signed)
Subjective:    Patient ID: Vanessa Oliver, female    DOB: 01-09-1951, 64 y.o.   MRN: [id number]  HPI  Vanessa Oliver is a 64 yr old female who present today with complaint of hive like rashes x 3 weeks. She reports left inner knee is currently affected and is pruritic and painful. Has been using claritin without imrovement. Denies tongue/lip swelling or shortness of breath.  Reports that she developed red/swollenpruritic areas intermittently on her body. These have resolved. She noted a similar rash that occurred under the band at the top of her support hose so she stopped wearing them.   Review of Systems     Past Medical History  Diagnosis Date  . Arthritis   . Hyperlipidemia   . Hiatal hernia   . Fibromyalgia   . IBS (irritable bowel syndrome)   . Esophageal stricture   . Plantar fasciitis   . Rosacea   . TMJ (temporomandibular joint disorder)   . Vasovagal syncope   . Allergy     takes Singulair at night  . Fibromyalgia   . Depression     takes CYmbalta daily  . GERD (gastroesophageal reflux disease)     takes Omeprazole daily  . Family history of adverse reaction to anesthesia     oldest brother had trouble with anesthesia a long time ago but can't recall what  . History of bronchitis   . Vasovagal episode back in the 80's  . Weakness     right leg  . Joint pain   . Chronic back pain     stenosis  . Joint swelling   . Hemorrhoids     Social History   Social History  . Marital Status: Married    Spouse Name: N/A  . Number of Children: 0  . Years of Education: N/A   Occupational History  . clerical    Social History Main Topics  . Smoking status: Never Smoker   . Smokeless tobacco: Never Used  . Alcohol Use: 0.0 oz/week    0 Standard drinks or equivalent per week     Comment: rarely  . Drug Use: No  . Sexual Activity: Yes    Birth Control/ Protection: Surgical   Other Topics Concern  . Not on file   Social History Narrative   Works as an  Glass blower/designer   Married- husband is 47 years older than her   Former Dance movement psychotherapist up up [employer]   Has cats   Enjoys reading   No children    Past Surgical History  Procedure Laterality Date  . Nissen fundoplication  5993  . Retinal laser procedure Right 02/01/2011    right eye   . Nasal septum surgery  1970  . Knee arthroscopy Right 1994    x 3  . 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 Carpal tunnel release Bilateral 2004  . Trigger finger release Bilateral 2005  . Abdominal hysterectomy  1997  . Varicose vein surgery Bilateral 2007, 2009    left 2007, right 2009  . Mallet finger Right 2011  . Vitrectomy Right 2012  . Cataract extraction Right 2013    Dr Ellison Hughs  . Carpometacarpal (cmc) fusion of thumb  2015    thumb  . Tonsillectomy  1981  . Nissen fundoplication    . Colonoscopy    . Colonoscopy with esophagogastroduodenoscopy (egd) and esophageal dilation (ed)    . Eye surgery  scar tissue remove  . Lumbar laminectomy/decompression microdiscectomy Right 02/11/2015    Procedure: Laminectomy and Foraminotomy - Right - L3-L4;  Surgeon: Earnie Larsson, MD;  Location: [facility];  Service: Neurosurgery;  Laterality: Right;  Laminectomy and Foraminotomy - Right - L3-L4    Family History  Problem Relation Age of Onset  . Breast cancer Mother   . Arthritis Mother   . Hyperlipidemia Mother   . Hypertension Mother   . Lung cancer Father   . Pancreatic cancer Father   . Skin cancer Father   . Arthritis Father   . Celiac disease Brother   . Hyperlipidemia Maternal Grandfather   . Heart disease Maternal Grandfather   . Stroke Maternal Grandfather     Allergies  Allergen Reactions  . Cefuroxime Axetil     Hives / "throat swelling"  . Chlorzoxazone Rash and Other (See Comments)    "breathing problems"   . Oxycodone     Hallucinations, rash  . Penicillins Anaphylaxis  . Adhesive [Tape]     rash  . Cataflam [Diclofenac Potassium]     Lip swelling  .  Clarithromycin Diarrhea  . Flagyl [Metronidazole Hcl]     ?diarrhea  . Glucosamine Hives  . Guaifenesin & Derivatives     Stomach upset  . Hydrocodone-Acetaminophen     REACTION: rash, tightening of throat  . Ketorolac Tromethamine   . Pentazocine     Involuntary twitching  . Pneumovax [Pneumococcal Polysaccharide Vaccine] Other (See Comments)    Redness/swelling at site, nausea  . Smz-Tmp Ds [Sulfamethoxazole W/Trimethoprim (Co-Trimoxazole)]     ?unknown reaction  . Tramadol     constipation  . Trazodone And Nefazodone Other (See Comments)    Scratchy throat / congestion    Current Outpatient Prescriptions on File Prior to Visit  Medication Sig Dispense Refill  . aspirin 81 MG tablet Take 81 mg by mouth daily.    . Azelaic Acid (FINACEA) 15 % cream Apply topically 1 day or 1 dose. After skin is thoroughly washed and patted dry, gently but thoroughly massage a thin film of azelaic acid cream into the affected area twice daily, in the morning and evening.     . benzoyl peroxide 5 % gel Apply 1 application topically daily.     . Calcium Carbonate-Vitamin D (CALTRATE 600+D) 600-400 MG-UNIT per tablet Take 1 tablet by mouth 2 (two) times daily. (Patient taking differently: Take 2 tablets by mouth 2 (two) times daily. )    . cholecalciferol (VITAMIN D) 1000 UNITS tablet Take 1,000 Units by mouth daily.     . diclofenac sodium (VOLTAREN) 1 % GEL Apply 2 g topically 2 (two) times daily as needed. 300 g 1  . Difluprednate (DUREZOL) 0.05 % EMUL Place 1 drop into the right eye 4 (four) times daily. Pt will be finished 02/10/15    . DULoxetine (CYMBALTA) 60 MG capsule Take 1 capsule (60 mg total) by mouth daily. 90 capsule 1  . FIBER PO Take 2 capsules by mouth 2 (two) times daily.    . montelukast (SINGULAIR) 10 MG tablet Take 10 mg by mouth at bedtime.    . Multiple Vitamin (MULTIVITAMIN) tablet Take 1 tablet by mouth daily.    Marland Kitchen omeprazole (PRILOSEC) 40 MG capsule Take 1 capsule (40 mg total)  by mouth 2 (two) times daily. (Patient taking differently: Take 40 mg by mouth daily. ) 180 capsule 3  . Pitavastatin Calcium (LIVALO) 2 MG TABS Take 1 mg by mouth.     Marland Kitchen  Probiotic Product (PROBIOTIC DAILY PO) Take 1 tablet by mouth daily.    . vitamin C (ASCORBIC ACID) 500 MG tablet Take 500 mg by mouth daily.    . Vitamin D, Ergocalciferol, (DRISDOL) 50000 UNITS CAPS capsule Take 1 capsule (50,000 Units total) by mouth every 7 (seven) days. 12 capsule 0   No current facility-administered medications on file prior to visit.    BP 138/82 mmHg  Pulse 77  Temp(Src) 98 F (36.7 C) (Oral)  Resp 16  Ht 5\' 4"  (1.626 m)  Wt 172 lb (78.019 kg)  BMI 29.51 kg/m2  SpO2 99%  LMP 05/14/1996    Objective:   Physical Exam  Constitutional: She is oriented to person, place, and time. She appears well-developed and well-nourished.  Cardiovascular: Normal rate, regular rhythm and normal heart sounds.   No murmur heard. Pulmonary/Chest: Effort normal and breath sounds normal. No respiratory distress. She has no wheezes.  Musculoskeletal: She exhibits no edema.  Neurological: She is alert and oriented to person, place, and time.  Skin: Skin is warm and dry.  Warm/tender erythema left medial knee  Psychiatric: She has a normal mood and affect. Her behavior is normal. Judgment and thought content normal.             Assessment & Plan:

## 2015-03-14 NOTE — Progress Notes (Signed)
Pre visit review using our clinic review tool, if applicable. No additional management support is needed unless otherwise documented below in the visit note. 

## 2015-03-14 NOTE — Assessment & Plan Note (Signed)
Most likely allergic, however will cover for cellulitis as well. rx with prednisone and doxy. Pt advised to call if area becomes larger, more painful. Refer to allergist for further evaluation.  She requests Hep C testing.

## 2015-03-14 NOTE — Patient Instructions (Signed)
You will be contacted about your referral to the allergist. Start doxycyline and prednisone. Call if increased pain/redness/swelling. Schedule mammogram on the first floor.

## 2015-03-17 ENCOUNTER — Encounter: Payer: Self-pay | Admitting: Family

## 2015-03-18 ENCOUNTER — Encounter: Payer: Self-pay | Admitting: Family

## 2015-03-20 ENCOUNTER — Ambulatory Visit: Payer: Medicare Other | Attending: Neurosurgery | Admitting: Physical Therapy

## 2015-03-20 ENCOUNTER — Encounter: Payer: Self-pay | Admitting: Physical Therapy

## 2015-03-20 DIAGNOSIS — M545 Low back pain, unspecified: Secondary | ICD-10-CM

## 2015-03-20 NOTE — Therapy (Signed)
[facility] [address]  [place], [place], [address] Phone: [phone]   Fax:  [phone]  Physical Therapy Evaluation  Patient Details  Name: Vanessa Oliver MRN: [id number] Date of Birth: 10-05-1950 Referring Provider:  Earnie Larsson, MD  Encounter Date: 03/20/2015      PT End of Session - 03/20/15 1014    Visit Number 1   Number of Visits 12   Date for PT Re-Evaluation 04/17/15   PT Start Time 5027   PT Stop Time 1059   PT Time Calculation (min) 45 min      Past Medical History  Diagnosis Date  . Arthritis   . Hyperlipidemia   . Hiatal hernia   . Fibromyalgia   . IBS (irritable bowel syndrome)   . Esophageal stricture   . Plantar fasciitis   . Rosacea   . TMJ (temporomandibular joint disorder)   . Vasovagal syncope   . Allergy     takes Singulair at night  . Fibromyalgia   . Depression     takes CYmbalta daily  . GERD (gastroesophageal reflux disease)     takes Omeprazole daily  . Family history of adverse reaction to anesthesia     oldest brother had trouble with anesthesia a long time ago but can't recall what  . History of bronchitis   . Vasovagal episode back in the 80's  . Weakness     right leg  . Joint pain   . Chronic back pain     stenosis  . Joint swelling   . Hemorrhoids     Past Surgical History  Procedure Laterality Date  . Nissen fundoplication  7412  . Retinal laser procedure Right 02/01/2011    right eye   . Nasal septum surgery  1970  . Knee arthroscopy Right 1994    x 3  . 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 Carpal tunnel release Bilateral 2004  . Trigger finger release Bilateral 2005  . Abdominal hysterectomy  1997  . Varicose vein surgery Bilateral 2007, 2009    left 2007, right 2009  . Mallet finger Right 2011  . Vitrectomy Right 2012  . Cataract extraction Right 2013    Dr Ellison Hughs  . Carpometacarpal (cmc) fusion of thumb  2015    thumb  .  Tonsillectomy  1981  . Nissen fundoplication    . Colonoscopy    . Colonoscopy with esophagogastroduodenoscopy (egd) and esophageal dilation (ed)    . Eye surgery      scar tissue remove  . Lumbar laminectomy/decompression microdiscectomy Right 02/11/2015    Procedure: Laminectomy and Foraminotomy - Right - L3-L4;  Surgeon: Earnie Larsson, MD;  Location: [facility];  Service: Neurosurgery;  Laterality: Right;  Laminectomy and Foraminotomy - Right - L3-L4    There were no vitals filed for this visit.  Visit Diagnosis:  Bilateral low back pain without sciatica      Subjective Assessment - 03/20/15 1016    Subjective pt with chronic LBP for several years and is s/p lumbar L3-4 laminectomy on 02/11/15. Since surgery pt states she is "doing okay" but states she feels as though she may be doing too much.  She denies n/t since surgery. Pt states continues to note pain from lateral R hip to medial R ankle upon waking each AM.  States notes intermittent catch sensation in L lower back with l-spine flexion.  Has difficulty finding  comfortable position with sitting due to R hip/LBP. States B lower back feels tight all the time.  Not performing any stretches.  States that she stays busy around the house but denies regular exercise.   Pertinent History fibromyalgia   Patient Stated Goals like "muscles to be limber" and wants to get back to exercise   Currently in Pain? Yes   Pain Score --  5-6/10 AVG and up to 8/10 at worst   Pain Location Back   Pain Orientation Lower   Aggravating Factors  forward bending   Pain Relieving Factors meds            [facility] PT Assessment - 03/20/15 0001    Assessment   Medical Diagnosis s/p l3/4 laminectomy   Onset Date/Surgical Date 02/11/15   Next MD Visit 04/24/15   Balance Screen   Has the patient fallen in the past 6 months No   Has the patient had a decrease in activity level because of a fear of falling?  No   Is the patient reluctant to leave their home  because of a fear of falling?  No   Prior Function   Vocation Retired   Leisure remains very busy around the house due to pt's mother moving in with her soon   Observation/Other Assessments   Focus on Therapeutic Outcomes (FOTO)  62% limitation   Posture/Postural Control   Posture Comments mild sway back posture in standing   ROM / Strength   AROM / PROM / Strength Strength   AROM   AROM Assessment Site Lumbar   Lumbar Flexion hands to mid shins (mild L LBP)   Lumbar Extension 50% normal (mild R LBP)   Lumbar - Right Side [facility] but with R LBP   Lumbar - Left Side [facility] but with R LBP   Strength   Strength Assessment Site Hip   Right/Left Hip Right;Left   Right Hip Flexion 4/5   Right Hip Extension 3+/5   Right Hip External Rotation  4-/5   Right Hip Internal Rotation 5/5   Right Hip ABduction 4-/5   Left Hip Flexion 4+/5   Left Hip Extension 4-/5   Left Hip External Rotation 4/5   Left Hip Internal Rotation 5/5   Left Hip ABduction 4/5   Right/Left Knee Right;Left   Right Knee Flexion 4/5   Right Knee Extension 5/5   Left Knee Flexion 5/5   Left Knee Extension 5/5   Flexibility   Soft Tissue Assessment /Muscle Length --  very tight B RF, mild tight B piri and HS   Special Tests    Special Tests Lumbar   Lumbar Tests FABER test;Slump Test;Prone Knee Bend Test;Straight Leg Raise   FABER test   findings Positive   Side LEft   Slump test   Findings Negative   Prone Knee Bend Test   Findings Negative   Comment no pain but very tight B RF   Straight Leg Raise   Findings Negative         TODAY'S TREATMENT TherEx - HEP instruct and perform: Bridge 10x Partial Curl-up 10x SKTC 20" each Prone Knee Flexion 20" each            PT Short Term Goals - 03/20/15 1116    PT SHORT TERM GOAL #1   Title pt independent with initial HEP by 03/28/15   Status New           PT Long Term Goals - 03/20/15  [place] #1   Title pt independent  with advanced HEP as necessary by 04/17/15   Status New   PT LONG TERM GOAL #2   Title pt displays B LE MMT 4+/5 or greater by 04/17/15   Status New   PT LONG TERM GOAL #3   Title pt with pain-free lumbar AROM WFL by 04/17/15   Status New   PT LONG TERM GOAL #4   Title pt able to return to performing chores, exercise, and recreational activities without limitation by LBP by 04/17/15   Status New               Plan - April 01, 2015 1149    Clinical Impression Statement pt s/p L3-4 laminectomy on 02/11/15.  She reports significant improvement in symptoms since surgery but states still with LBP and tightness.  She states her pain has ranged 5-6/10 on average and 8/10 at worst over the past several days.  She notes greatest pain with forward bending/stooping activities.  She states she's been very busy around the house lately preparing for her mother to move in with her.   Today's assessment reveals weakness to B LE (especially noted in R Hip muscles), no radicular s/s, NEG SLR, NEG Slump, POS FABER for L LBP, very tight B RF and mild tight B HS and Piriformis.  Posture includes mild sway back.  Pt very motivated and will likely progress very well with PT.   Pt will benefit from skilled therapeutic intervention in order to improve on the following deficits Pain;Decreased strength;Decreased mobility;Impaired flexibility;Decreased range of motion   Rehab Potential Good   PT Frequency --  2-3x / wk   PT Duration 4 weeks   PT Treatment/Interventions Therapeutic exercise;Therapeutic activities;Manual techniques;Taping;Patient/family education;Electrical Stimulation;Cryotherapy;Moist Heat   PT Next Visit Plan RF, HS, Piri stretch; lumbopelvic stability   Consulted and Agree with Plan of Care Patient          G-Codes - 2015-04-01 1033    Functional Assessment Tool Used foto 62% limitation   Functional Limitation Mobility: Walking and moving around   Mobility: Walking and Moving Around Current Status  [id number]) At least 60 percent but less than 80 percent impaired, limited or restricted   Mobility: Walking and Moving Around Goal Status [id number]) At least 40 percent but less than 60 percent impaired, limited or restricted       Problem List Patient Active Problem List   Diagnosis Date Noted  . Urticarial rash 03/14/2015  . 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 Lumbar stenosis 02/11/2015  . Abdominal pain, chronic, right lower quadrant 01/07/2015  . Right hip pain 10/31/2014  . Vitamin D deficiency 10/31/2014  . Osteopenia 10/18/2014  . OSA (obstructive sleep apnea) 07/16/2014  . Migraines 07/16/2014  . TMJ (dislocation of temporomandibular joint) 07/16/2014  . HLD (hyperlipidemia) 03/22/2014  . S/P Nissen fundoplication May 7782 42/35/3614  . Dysphagia 01/24/2008  . DEPRESSION/ANXIETY 12/02/2007  . ESOPHAGEAL STRICTURE 12/02/2007  . GERD 12/02/2007  . Irritable bowel syndrome 12/02/2007  . Fibromyalgia 12/02/2007  . VASOVAGAL SYNCOPE 12/02/2007    Ella Guillotte PT, OCS Apr 01, 2015, 11:56 AM  [facility] [place], [place], [address] Phone: [phone]   Fax:  [phone]

## 2015-03-24 ENCOUNTER — Ambulatory Visit (INDEPENDENT_AMBULATORY_CARE_PROVIDER_SITE_OTHER): Payer: Medicare Other | Admitting: Family

## 2015-03-24 ENCOUNTER — Encounter: Payer: Self-pay | Admitting: Family

## 2015-03-24 VITALS — BP 126/76 | HR 78 | Temp 98.0°F | Resp 18 | Ht 64.0 in | Wt 168.0 lb

## 2015-03-24 DIAGNOSIS — L509 Urticaria, unspecified: Secondary | ICD-10-CM | POA: Diagnosis not present

## 2015-03-24 NOTE — Progress Notes (Signed)
Subjective:    Patient ID: Vanessa Oliver, female    DOB: 07-03-1951, 64 y.o.   MRN: [id number]  HPI   Vanessa Oliver is a 64 yr old female who presents today with chief complaint of rash. Rash is pruritic. Rash is intermittent and keeps showing up in different places then resolving. Today she reports rash on the right forearm. She reports that she is using claritin 10 mg twice daily. Using benadryl cream. Has apt with allergist on 9/26.  Denies tongue/lip swelling or SOB.     Review of Systems    see HPI  Past Medical History  Diagnosis Date  . Arthritis   . Hyperlipidemia   . Hiatal hernia   . Fibromyalgia   . IBS (irritable bowel syndrome)   . Esophageal stricture   . Plantar fasciitis   . Rosacea   . TMJ (temporomandibular joint disorder)   . Vasovagal syncope   . Allergy     takes Singulair at night  . Fibromyalgia   . Depression     takes CYmbalta daily  . GERD (gastroesophageal reflux disease)     takes Omeprazole daily  . Family history of adverse reaction to anesthesia     oldest brother had trouble with anesthesia a long time ago but can't recall what  . History of bronchitis   . Vasovagal episode back in the 80's  . Weakness     right leg  . Joint pain   . Chronic back pain     stenosis  . Joint swelling   . Hemorrhoids     Social History   Social History  . Marital Status: Married    Spouse Name: N/A  . Number of Children: 0  . Years of Education: N/A   Occupational History  . clerical    Social History Main Topics  . Smoking status: Never Smoker   . Smokeless tobacco: Never Used  . Alcohol Use: 0.0 oz/week    0 Standard drinks or equivalent per week     Comment: rarely  . Drug Use: No  . Sexual Activity: Yes    Birth Control/ Protection: Surgical   Other Topics Concern  . Not on file   Social History Narrative   Works as an Glass blower/designer   Married- husband is 43 years older than her   Former Dance movement psychotherapist up up [employer]   Has  cats   Enjoys reading   No children    Past Surgical History  Procedure Laterality Date  . Nissen fundoplication  6269  . Retinal laser procedure Right 02/01/2011    right eye   . Nasal septum surgery  1970  . Knee arthroscopy Right 1994    x 3  . 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 Carpal tunnel release Bilateral 2004  . Trigger finger release Bilateral 2005  . Abdominal hysterectomy  1997  . Varicose vein surgery Bilateral 2007, 2009    left 2007, right 2009  . Mallet finger Right 2011  . Vitrectomy Right 2012  . Cataract extraction Right 2013    Dr Ellison Hughs  . Carpometacarpal (cmc) fusion of thumb  2015    thumb  . Tonsillectomy  1981  . Nissen fundoplication    . Colonoscopy    . Colonoscopy with esophagogastroduodenoscopy (egd) and esophageal dilation (ed)    . Eye surgery      scar tissue remove  . Lumbar laminectomy/decompression microdiscectomy Right  02/11/2015    Procedure: Laminectomy and Foraminotomy - Right - L3-L4;  Surgeon: Earnie Larsson, MD;  Location: [facility];  Service: Neurosurgery;  Laterality: Right;  Laminectomy and Foraminotomy - Right - L3-L4    Family History  Problem Relation Age of Onset  . Breast cancer Mother   . Arthritis Mother   . Hyperlipidemia Mother   . Hypertension Mother   . Lung cancer Father   . Pancreatic cancer Father   . Skin cancer Father   . Arthritis Father   . Celiac disease Brother   . Hyperlipidemia Maternal Grandfather   . Heart disease Maternal Grandfather   . Stroke Maternal Grandfather     Allergies  Allergen Reactions  . Cefuroxime Axetil     Hives / "throat swelling"  . Chlorzoxazone Rash and Other (See Comments)    "breathing problems"   . Oxycodone     Hallucinations, rash  . Penicillins Anaphylaxis  . Adhesive [Tape]     rash  . Cataflam [Diclofenac Potassium]     Lip swelling  . Clarithromycin Diarrhea  . Flagyl [Metronidazole Hcl]     ?diarrhea  . Glucosamine Hives  . Guaifenesin &  Derivatives     Stomach upset  . Hydrocodone-Acetaminophen     REACTION: rash, tightening of throat  . Ketorolac Tromethamine     ?reaction  . Pentazocine     Involuntary twitching  . Pneumovax [Pneumococcal Polysaccharide Vaccine] Other (See Comments)    Redness/swelling at site, nausea  . Smz-Tmp Ds [Sulfamethoxazole W/Trimethoprim (Co-Trimoxazole)]     ?unknown reaction  . Tramadol     constipation  . Trazodone And Nefazodone Other (See Comments)    Scratchy throat / congestion    Current Outpatient Prescriptions on File Prior to Visit  Medication Sig Dispense Refill  . aspirin 81 MG tablet Take 81 mg by mouth daily.    . Azelaic Acid (FINACEA) 15 % cream Apply topically 1 day or 1 dose. After skin is thoroughly washed and patted dry, gently but thoroughly massage a thin film of azelaic acid cream into the affected area twice daily, in the morning and evening.     . benzoyl peroxide 5 % gel Apply 1 application topically daily.     . Calcium Carbonate-Vitamin D (CALTRATE 600+D) 600-400 MG-UNIT per tablet Take 1 tablet by mouth 2 (two) times daily. (Patient taking differently: Take 2 tablets by mouth 2 (two) times daily. )    . cholecalciferol (VITAMIN D) 1000 UNITS tablet Take 1,000 Units by mouth daily.     . diclofenac sodium (VOLTAREN) 1 % GEL Apply 2 g topically 2 (two) times daily as needed. 300 g 1  . Difluprednate (DUREZOL) 0.05 % EMUL Place 1 drop into the right eye 4 (four) times daily. Pt will be finished 02/10/15    . DULoxetine (CYMBALTA) 60 MG capsule Take 1 capsule (60 mg total) by mouth daily. 90 capsule 1  . FIBER PO Take 2 capsules by mouth 2 (two) times daily.    . montelukast (SINGULAIR) 10 MG tablet Take 10 mg by mouth at bedtime.    . Multiple Vitamin (MULTIVITAMIN) tablet Take 1 tablet by mouth daily.    Marland Kitchen omeprazole (PRILOSEC) 40 MG capsule Take 1 capsule (40 mg total) by mouth 2 (two) times daily. (Patient taking differently: Take 40 mg by mouth daily. ) 180  capsule 3  . Pitavastatin Calcium (LIVALO) 2 MG TABS Take 1 mg by mouth.     Marland Kitchen  Probiotic Product (PROBIOTIC DAILY PO) Take 1 tablet by mouth daily.    . vitamin C (ASCORBIC ACID) 500 MG tablet Take 500 mg by mouth daily.    . Vitamin D, Ergocalciferol, (DRISDOL) 50000 UNITS CAPS capsule Take 1 capsule (50,000 Units total) by mouth every 7 (seven) days. 12 capsule 0   No current facility-administered medications on file prior to visit.    BP 126/76 mmHg  Pulse 78  Temp(Src) 98 F (36.7 C) (Oral)  Resp 18  Ht 5\' 4"  (1.626 m)  Wt 168 lb (76.204 kg)  BMI 28.82 kg/m2  SpO2 99%  LMP 05/14/1996    Objective:   Physical Exam  Constitutional: She is oriented to person, place, and time. She appears well-developed and well-nourished. No distress.  Neurological: She is alert and oriented to person, place, and time.  Skin: Skin is warm and dry.  Raised rash right forearm            Assessment & Plan:

## 2015-03-24 NOTE — Progress Notes (Signed)
Pre visit review using our clinic review tool, if applicable. No additional management support is needed unless otherwise documented below in the visit note. 

## 2015-03-24 NOTE — Assessment & Plan Note (Signed)
Recurrent. Advised pt to continue claritin, keep upcoming apt with allergist. Go to ER if tongue/lip swelling/sob occurs. Pt verbalizes understanding.

## 2015-03-24 NOTE — Patient Instructions (Signed)
Please keep your upcoming appointment with the allergist.

## 2015-03-25 ENCOUNTER — Ambulatory Visit: Payer: Medicare Other | Admitting: Physical Therapy

## 2015-03-25 DIAGNOSIS — M545 Low back pain, unspecified: Secondary | ICD-10-CM

## 2015-03-25 NOTE — Therapy (Signed)
[facility] [address]  [place], [place], [address] Phone: [phone]   Fax:  [phone]  Physical Therapy Treatment  Patient Details  Name: Vanessa Oliver MRN: [id number] Date of Birth: 1951/03/17 Referring Provider:  Earnie Larsson, MD  Encounter Date: 03/25/2015      PT End of Session - 03/25/15 1326    Visit Number 2   Number of Visits 12   Date for PT Re-Evaluation 04/17/15   PT Start Time 5643   PT Stop Time 1405   PT Time Calculation (min) 42 min      Past Medical History  Diagnosis Date  . Arthritis   . Hyperlipidemia   . Hiatal hernia   . Fibromyalgia   . IBS (irritable bowel syndrome)   . Esophageal stricture   . Plantar fasciitis   . Rosacea   . TMJ (temporomandibular joint disorder)   . Vasovagal syncope   . Allergy     takes Singulair at night  . Fibromyalgia   . Depression     takes CYmbalta daily  . GERD (gastroesophageal reflux disease)     takes Omeprazole daily  . Family history of adverse reaction to anesthesia     oldest brother had trouble with anesthesia a long time ago but can't recall what  . History of bronchitis   . Vasovagal episode back in the 80's  . Weakness     right leg  . Joint pain   . Chronic back pain     stenosis  . Joint swelling   . Hemorrhoids     Past Surgical History  Procedure Laterality Date  . Nissen fundoplication  3295  . Retinal laser procedure Right 02/01/2011    right eye   . Nasal septum surgery  1970  . Knee arthroscopy Right 1994    x 3  . 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 Carpal tunnel release Bilateral 2004  . Trigger finger release Bilateral 2005  . Abdominal hysterectomy  1997  . Varicose vein surgery Bilateral 2007, 2009    left 2007, right 2009  . Mallet finger Right 2011  . Vitrectomy Right 2012  . Cataract extraction Right 2013    Dr Ellison Hughs  . Carpometacarpal (cmc) fusion of thumb  2015    thumb  .  Tonsillectomy  1981  . Nissen fundoplication    . Colonoscopy    . Colonoscopy with esophagogastroduodenoscopy (egd) and esophageal dilation (ed)    . Eye surgery      scar tissue remove  . Lumbar laminectomy/decompression microdiscectomy Right 02/11/2015    Procedure: Laminectomy and Foraminotomy - Right - L3-L4;  Surgeon: Earnie Larsson, MD;  Location: [facility];  Service: Neurosurgery;  Laterality: Right;  Laminectomy and Foraminotomy - Right - L3-L4    There were no vitals filed for this visit.  Visit Diagnosis:  Bilateral low back pain without sciatica      Subjective Assessment - 03/25/15 1323    Subjective States has been busy cleaning garage and home getting ready for her mother to move in. States has been performing HEP but notes some difficulty with partial curl-ups due to neck and shoulder pain but has been able to perform.  Denies LBP curently.   Currently in Pain? No/denies          TODAY'S TREATMENT TherEx - Bridge 10x Stretch B HS (good pliability), B Piri (slight tightness), B Hip  Flexors (very tight on R, moderate L), B RF (moderate tight B) B FOB (55cm) LTR 10x B FOB (55cm) Bridge 10x Quadruplex 10x Cat/Camel 10x Seated one arm row diagonal Double Black TB 10x each TRX DL Squat 10x Standing Hip Extension and ABD Green Tb 10x each with Single Pole A Low Row 25# 12x              PT Short Term Goals - 03/25/15 1355    PT SHORT TERM GOAL #1   Title pt independent with initial HEP by 03/28/15   Status Achieved           PT Long Term Goals - 03/25/15 1356    PT LONG TERM GOAL #1   Title pt independent with advanced HEP as necessary by 04/17/15   Status On-going   PT LONG TERM GOAL #2   Title pt displays B LE MMT 4+/5 or greater by 04/17/15   Status On-going   PT LONG TERM GOAL #3   Title pt with pain-free lumbar AROM WFL by 04/17/15   Status On-going   PT LONG TERM GOAL #4   Title pt able to return to performing chores, exercise, and  recreational activities without limitation by LBP by 04/17/15   Status On-going               Plan - 03/25/15 1411    Clinical Impression Statement initial session well tolerated but pt does seem to fatigue rather quickly.  No c/o back pain with any exercises.   PT Next Visit Plan RF, HS, Piri stretch; lumbopelvic stability; B hip strengthening   Consulted and Agree with Plan of Care Patient        Problem List Patient Active Problem List   Diagnosis Date Noted  . Urticarial rash 03/14/2015  . 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 Lumbar stenosis 02/11/2015  . Abdominal pain, chronic, right lower quadrant 01/07/2015  . Right hip pain 10/31/2014  . Vitamin D deficiency 10/31/2014  . Osteopenia 10/18/2014  . OSA (obstructive sleep apnea) 07/16/2014  . Migraines 07/16/2014  . TMJ (dislocation of temporomandibular joint) 07/16/2014  . HLD (hyperlipidemia) 03/22/2014  . S/P Nissen fundoplication May 4825 00/37/0488  . Dysphagia 01/24/2008  . DEPRESSION/ANXIETY 12/02/2007  . ESOPHAGEAL STRICTURE 12/02/2007  . GERD 12/02/2007  . Irritable bowel syndrome 12/02/2007  . Fibromyalgia 12/02/2007  . VASOVAGAL SYNCOPE 12/02/2007    Briston Lax PT, OCS 03/25/2015, 2:13 PM  [facility] [address]  [place], [place], [address] Phone: [phone]   Fax:  [phone]

## 2015-03-27 ENCOUNTER — Ambulatory Visit: Payer: Medicare Other | Admitting: Physical Therapy

## 2015-03-27 DIAGNOSIS — M545 Low back pain, unspecified: Secondary | ICD-10-CM

## 2015-03-27 NOTE — Therapy (Signed)
[facility] [address]  [place], [place], [address] Phone: [phone]   Fax:  [phone]  Physical Therapy Treatment  Patient Details  Name: Vanessa Oliver MRN: [id number] Date of Birth: 1950/12/31 Referring Provider:  Earnie Larsson, MD  Encounter Date: 03/27/2015      PT End of Session - 03/27/15 1108    Visit Number 3   Number of Visits 12   Date for PT Re-Evaluation 04/17/15   PT Start Time 1021   PT Stop Time 1106   PT Time Calculation (min) 45 min      Past Medical History  Diagnosis Date  . Arthritis   . Hyperlipidemia   . Hiatal hernia   . Fibromyalgia   . IBS (irritable bowel syndrome)   . Esophageal stricture   . Plantar fasciitis   . Rosacea   . TMJ (temporomandibular joint disorder)   . Vasovagal syncope   . Allergy     takes Singulair at night  . Fibromyalgia   . Depression     takes CYmbalta daily  . GERD (gastroesophageal reflux disease)     takes Omeprazole daily  . Family history of adverse reaction to anesthesia     oldest brother had trouble with anesthesia a long time ago but can't recall what  . History of bronchitis   . Vasovagal episode back in the 80's  . Weakness     right leg  . Joint pain   . Chronic back pain     stenosis  . Joint swelling   . Hemorrhoids     Past Surgical History  Procedure Laterality Date  . Nissen fundoplication  8242  . Retinal laser procedure Right 02/01/2011    right eye   . Nasal septum surgery  1970  . Knee arthroscopy Right 1994    x 3  . 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 Carpal tunnel release Bilateral 2004  . Trigger finger release Bilateral 2005  . Abdominal hysterectomy  1997  . Varicose vein surgery Bilateral 2007, 2009    left 2007, right 2009  . Mallet finger Right 2011  . Vitrectomy Right 2012  . Cataract extraction Right 2013    Dr Ellison Hughs  . Carpometacarpal (cmc) fusion of thumb  2015    thumb  .  Tonsillectomy  1981  . Nissen fundoplication    . Colonoscopy    . Colonoscopy with esophagogastroduodenoscopy (egd) and esophageal dilation (ed)    . Eye surgery      scar tissue remove  . Lumbar laminectomy/decompression microdiscectomy Right 02/11/2015    Procedure: Laminectomy and Foraminotomy - Right - L3-L4;  Surgeon: Earnie Larsson, MD;  Location: [facility];  Service: Neurosurgery;  Laterality: Right;  Laminectomy and Foraminotomy - Right - L3-L4    There were no vitals filed for this visit.  Visit Diagnosis:  Bilateral low back pain without sciatica      Subjective Assessment - 03/27/15 1023    Subjective states didn't note any increased pain after last treatment and no c/o pain this AM other than R foot which she states has been a chronic problem.  Noted mild muscle soreness but nothing significant and states has attempted to perform some of last treatment's exercises as part of HEP.   Currently in Pain? No/denies          TODAY'S TREATMENT TherEx - Bridge 15x LTR 30" Unsupported LTR 30" (  fatigued but no pain) Stretch B HS (good pliability), B Piri (slight tightness), B Hip Flexors (very tight on R, moderate L), B RF (moderate tight B) Partial Curl-up 15x Supine ALT SLR with TrA 8x each TRX DL Squat 12x TRX Low Row 12x (fatigued, no pain, good form) Corner Pec Stretch 2x20" Standing Hip ABD, Extension, and ADD Green Tb 12x each with Single Pole A Seated on PBall (55cm) one arm row diagonal Double Black TB 2x15 each Seated PBall (55cm) Rotation stab Double Green TB 8x5"                        PT Education - 03/27/15 1110    Education provided Yes   Education Details added seated one-arm row to [employer]) Educated Patient   Methods Explanation;Demonstration   Comprehension Verbalized understanding;Returned demonstration          PT Short Term Goals - 03/25/15 1355    PT SHORT TERM GOAL #1   Title pt independent with initial HEP by  03/28/15   Status Achieved           PT Long Term Goals - 03/25/15 1356    PT LONG TERM GOAL #1   Title pt independent with advanced HEP as necessary by 04/17/15   Status On-going   PT LONG TERM GOAL #2   Title pt displays B LE MMT 4+/5 or greater by 04/17/15   Status On-going   PT LONG TERM GOAL #3   Title pt with pain-free lumbar AROM WFL by 04/17/15   Status On-going   PT LONG TERM GOAL #4   Title pt able to return to performing chores, exercise, and recreational activities without limitation by LBP by 04/17/15   Status On-going               Plan - 03/27/15 1109    Clinical Impression Statement excellent progress to date; states exercises are going well and feels "more limber" after her sessions.   PT Next Visit Plan RF, HS, Piri stretch; lumbopelvic stability; B hip strengthening   Consulted and Agree with Plan of Care Patient        Problem List Patient Active Problem List   Diagnosis Date Noted  . Urticarial rash 03/14/2015  . 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 Lumbar stenosis 02/11/2015  . Abdominal pain, chronic, right lower quadrant 01/07/2015  . Right hip pain 10/31/2014  . Vitamin D deficiency 10/31/2014  . Osteopenia 10/18/2014  . OSA (obstructive sleep apnea) 07/16/2014  . Migraines 07/16/2014  . TMJ (dislocation of temporomandibular joint) 07/16/2014  . HLD (hyperlipidemia) 03/22/2014  . S/P Nissen fundoplication May 0272 53/66/4403  . Dysphagia 01/24/2008  . DEPRESSION/ANXIETY 12/02/2007  . ESOPHAGEAL STRICTURE 12/02/2007  . GERD 12/02/2007  . Irritable bowel syndrome 12/02/2007  . Fibromyalgia 12/02/2007  . VASOVAGAL SYNCOPE 12/02/2007    Elonda Giuliano PT, OCS 03/27/2015, 11:11 AM  [facility] [address]  [place], [place], [address] Phone: [phone]   Fax:  [phone]

## 2015-03-31 ENCOUNTER — Ambulatory Visit: Payer: Medicare Other | Admitting: Rehabilitation

## 2015-03-31 DIAGNOSIS — M545 Low back pain, unspecified: Secondary | ICD-10-CM

## 2015-03-31 NOTE — Therapy (Signed)
[facility] [address]  [place], [place], [address] Phone: [phone]   Fax:  [phone]  Physical Therapy Treatment  Patient Details  Name: Vanessa Oliver MRN: [id number] Date of Birth: 31-May-1951 Referring Provider:  Earnie Larsson, MD  Encounter Date: 03/31/2015      PT End of Session - 03/31/15 1316    Visit Number 4   Number of Visits 12   Date for PT Re-Evaluation 04/17/15   PT Start Time 1315   PT Stop Time 1357   PT Time Calculation (min) 42 min      Past Medical History  Diagnosis Date  . Arthritis   . Hyperlipidemia   . Hiatal hernia   . Fibromyalgia   . IBS (irritable bowel syndrome)   . Esophageal stricture   . Plantar fasciitis   . Rosacea   . TMJ (temporomandibular joint disorder)   . Vasovagal syncope   . Allergy     takes Singulair at night  . Fibromyalgia   . Depression     takes CYmbalta daily  . GERD (gastroesophageal reflux disease)     takes Omeprazole daily  . Family history of adverse reaction to anesthesia     oldest brother had trouble with anesthesia a long time ago but can't recall what  . History of bronchitis   . Vasovagal episode back in the 80's  . Weakness     right leg  . Joint pain   . Chronic back pain     stenosis  . Joint swelling   . Hemorrhoids     Past Surgical History  Procedure Laterality Date  . Nissen fundoplication  8022  . Retinal laser procedure Right 02/01/2011    right eye   . Nasal septum surgery  1970  . Knee arthroscopy Right 1994    x 3  . 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 Carpal tunnel release Bilateral 2004  . Trigger finger release Bilateral 2005  . Abdominal hysterectomy  1997  . Varicose vein surgery Bilateral 2007, 2009    left 2007, right 2009  . Mallet finger Right 2011  . Vitrectomy Right 2012  . Cataract extraction Right 2013    Dr Ellison Hughs  . Carpometacarpal (cmc) fusion of thumb  2015    thumb  .  Tonsillectomy  1981  . Nissen fundoplication    . Colonoscopy    . Colonoscopy with esophagogastroduodenoscopy (egd) and esophageal dilation (ed)    . Eye surgery      scar tissue remove  . Lumbar laminectomy/decompression microdiscectomy Right 02/11/2015    Procedure: Laminectomy and Foraminotomy - Right - L3-L4;  Surgeon: Earnie Larsson, MD;  Location: [facility];  Service: Neurosurgery;  Laterality: Right;  Laminectomy and Foraminotomy - Right - L3-L4    There were no vitals filed for this visit.  Visit Diagnosis:  Bilateral low back pain without sciatica      Subjective Assessment - 03/31/15 1315    Subjective Was working at her mothers house over the weekend and could barely move on Sunday. Thinks she over did it.    Currently in Pain? Yes   Pain Score --  6-8/10   Pain Location Back  low back running into Rt hip and down the leg   Pain Orientation Lower      TODAY'S TREATMENT TherEx - Bridge 15x LTR 30" Stretch B HS, B Piri, B Hip Flexors,  B RF  Cat/Camel 10x Corner Pec Stretch 2x20" Seated on PBall (55cm) one arm row diagonal Double Black TB 2x15 each Seated on PBall (55cm) Rotation stab Double Green TB 10x5" Seated on PBall (55cm) Slow march 10x3" TRX DL Squat 12x TRX Low Row 12x  Standing Hip ABD and Extension Green Tb 12x each with Single Pole A       PT Short Term Goals - 03/25/15 1355    PT SHORT TERM GOAL #1   Title pt independent with initial HEP by 03/28/15   Status Achieved           PT Long Term Goals - 03/25/15 1356    PT LONG TERM GOAL #1   Title pt independent with advanced HEP as necessary by 04/17/15   Status On-going   PT LONG TERM GOAL #2   Title pt displays B LE MMT 4+/5 or greater by 04/17/15   Status On-going   PT LONG TERM GOAL #3   Title pt with pain-free lumbar AROM WFL by 04/17/15   Status On-going   PT LONG TERM GOAL #4   Title pt able to return to performing chores, exercise, and recreational activities without limitation by LBP  by 04/17/15   Status On-going               Plan - 03/31/15 1358    Clinical Impression Statement Kept treatment similar to the past one due to higher pain levels. Reports feeling better after therapy today. Will be on vacation the rest of the week but will return next week.    PT Next Visit Plan RF, HS, Piri stretch; lumbopelvic stability; B hip strengthening   Consulted and Agree with Plan of Care Patient        Problem List Patient Active Problem List   Diagnosis Date Noted  . Urticarial rash 03/14/2015  . 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 Lumbar stenosis 02/11/2015  . Abdominal pain, chronic, right lower quadrant 01/07/2015  . Right hip pain 10/31/2014  . Vitamin D deficiency 10/31/2014  . Osteopenia 10/18/2014  . OSA (obstructive sleep apnea) 07/16/2014  . Migraines 07/16/2014  . TMJ (dislocation of temporomandibular joint) 07/16/2014  . HLD (hyperlipidemia) 03/22/2014  . S/P Nissen fundoplication May 0086 76/19/5093  . Dysphagia 01/24/2008  . DEPRESSION/ANXIETY 12/02/2007  . ESOPHAGEAL STRICTURE 12/02/2007  . GERD 12/02/2007  . Irritable bowel syndrome 12/02/2007  . Fibromyalgia 12/02/2007  . VASOVAGAL SYNCOPE 12/02/2007    Barbette Hair, PTA 03/31/2015, 2:00 PM  [facility] [address]  [place], [place], [address] Phone: [phone]   Fax:  [phone]

## 2015-04-07 ENCOUNTER — Ambulatory Visit: Payer: Medicare Other | Admitting: Physical Therapy

## 2015-04-07 ENCOUNTER — Ambulatory Visit (HOSPITAL_BASED_OUTPATIENT_CLINIC_OR_DEPARTMENT_OTHER)
Admission: RE | Admit: 2015-04-07 | Discharge: 2015-04-07 | Disposition: A | Discharge status: Home / Self Care | Payer: Medicare Other | Source: Ambulatory Visit | Attending: Family | Admitting: Family

## 2015-04-07 DIAGNOSIS — M545 Low back pain, unspecified: Secondary | ICD-10-CM

## 2015-04-07 DIAGNOSIS — J310 Chronic rhinitis: Secondary | ICD-10-CM | POA: Diagnosis not present

## 2015-04-07 DIAGNOSIS — L259 Unspecified contact dermatitis, unspecified cause: Secondary | ICD-10-CM | POA: Diagnosis not present

## 2015-04-07 DIAGNOSIS — T782XXA Anaphylactic shock, unspecified, initial encounter: Secondary | ICD-10-CM | POA: Diagnosis not present

## 2015-04-07 DIAGNOSIS — Z1231 Encounter for screening mammogram for malignant neoplasm of breast: Secondary | ICD-10-CM | POA: Diagnosis not present

## 2015-04-07 DIAGNOSIS — R109 Unspecified abdominal pain: Secondary | ICD-10-CM | POA: Diagnosis not present

## 2015-04-07 DIAGNOSIS — R5382 Chronic fatigue, unspecified: Secondary | ICD-10-CM | POA: Diagnosis not present

## 2015-04-07 DIAGNOSIS — T783XXA Angioneurotic edema, initial encounter: Secondary | ICD-10-CM | POA: Diagnosis not present

## 2015-04-07 DIAGNOSIS — L501 Idiopathic urticaria: Secondary | ICD-10-CM | POA: Diagnosis not present

## 2015-04-07 DIAGNOSIS — T7800XA Anaphylactic reaction due to unspecified food, initial encounter: Secondary | ICD-10-CM | POA: Diagnosis not present

## 2015-04-07 DIAGNOSIS — R062 Wheezing: Secondary | ICD-10-CM | POA: Diagnosis not present

## 2015-04-07 NOTE — Therapy (Signed)
[facility] [address]  [place], [place], [address] Phone: [phone]   Fax:  [phone]  Physical Therapy Treatment  Patient Details  Name: Vanessa Oliver MRN: [id number] Date of Birth: 1951-02-16 Referring Provider:  Earnie Larsson, MD  Encounter Date: 04/07/2015      PT End of Session - 04/07/15 1410    Visit Number 5   Number of Visits 12   Date for PT Re-Evaluation 04/17/15   PT Start Time 1400   PT Stop Time 1444   PT Time Calculation (min) 44 min      Past Medical History  Diagnosis Date  . Arthritis   . Hyperlipidemia   . Hiatal hernia   . Fibromyalgia   . IBS (irritable bowel syndrome)   . Esophageal stricture   . Plantar fasciitis   . Rosacea   . TMJ (temporomandibular joint disorder)   . Vasovagal syncope   . Allergy     takes Singulair at night  . Fibromyalgia   . Depression     takes CYmbalta daily  . GERD (gastroesophageal reflux disease)     takes Omeprazole daily  . Family history of adverse reaction to anesthesia     oldest brother had trouble with anesthesia a long time ago but can't recall what  . History of bronchitis   . Vasovagal episode back in the 80's  . Weakness     right leg  . Joint pain   . Chronic back pain     stenosis  . Joint swelling   . Hemorrhoids     Past Surgical History  Procedure Laterality Date  . Nissen fundoplication  7893  . Retinal laser procedure Right 02/01/2011    right eye   . Nasal septum surgery  1970  . Knee arthroscopy Right 1994    x 3  . 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 Carpal tunnel release Bilateral 2004  . Trigger finger release Bilateral 2005  . Abdominal hysterectomy  1997  . Varicose vein surgery Bilateral 2007, 2009    left 2007, right 2009  . Mallet finger Right 2011  . Vitrectomy Right 2012  . Cataract extraction Right 2013    Dr Ellison Hughs  . Carpometacarpal (cmc) fusion of thumb  2015    thumb  .  Tonsillectomy  1981  . Nissen fundoplication    . Colonoscopy    . Colonoscopy with esophagogastroduodenoscopy (egd) and esophageal dilation (ed)    . Eye surgery      scar tissue remove  . Lumbar laminectomy/decompression microdiscectomy Right 02/11/2015    Procedure: Laminectomy and Foraminotomy - Right - L3-L4;  Surgeon: Earnie Larsson, MD;  Location: [facility];  Service: Neurosurgery;  Laterality: Right;  Laminectomy and Foraminotomy - Right - L3-L4    There were no vitals filed for this visit.  Visit Diagnosis:  Bilateral low back pain without sciatica      Subjective Assessment - 04/07/15 1403    Subjective States has been feeling pretty good with regard to LBP since last treatment stating pain has been 3-4/10 lately.   Currently in Pain? Yes   Pain Score 3              TODAY'S TREATMENT TherEx - Bridge 15x LTR 30" Unsupported LTR 30" (fatigued but no pain) Stretch B Piri, B Hip Flexors, B RF  Partial Curl-up 15x B FOB (55cm) crunch with Nyoka Cowden  TB in hands pulled by PT 15x Prone UE and contra LE 10x each Corner Pec Stretch 2x20" Low Row 25# 15x, 35# 10x TRX DL Squat 15x Fitter BW Lunge 1 Black + 1 Blue 12x each with B Pole A B Knee Flexion Machine 25# 15x TRX Low Row 15x Staggered Standing punch diagonal Double Blue TB 10x each Staggered Standing one arm row diagonal Double Black TB 15x each                   PT Short Term Goals - 03/25/15 1355    PT SHORT TERM GOAL #1   Title pt independent with initial HEP by 03/28/15   Status Achieved           PT Long Term Goals - 03/25/15 1356    PT LONG TERM GOAL #1   Title pt independent with advanced HEP as necessary by 04/17/15   Status On-going   PT LONG TERM GOAL #2   Title pt displays B LE MMT 4+/5 or greater by 04/17/15   Status On-going   PT LONG TERM GOAL #3   Title pt with pain-free lumbar AROM WFL by 04/17/15   Status On-going   PT LONG TERM GOAL #4   Title pt able to return to  performing chores, exercise, and recreational activities without limitation by LBP by 04/17/15   Status On-going               Plan - 04/07/15 1445    Clinical Impression Statement has been feeling pretty good lately with pain running 3-4/10 in lower back; good performance today and displays improving strength in R HS and Glutes with exericses.   PT Next Visit Plan RF and Piri stretch; lumbopelvic stability; B hip strengthening   Consulted and Agree with Plan of Care Patient        Problem List Patient Active Problem List   Diagnosis Date Noted  . Urticarial rash 03/14/2015  . 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 Lumbar stenosis 02/11/2015  . Abdominal pain, chronic, right lower quadrant 01/07/2015  . Right hip pain 10/31/2014  . Vitamin D deficiency 10/31/2014  . Osteopenia 10/18/2014  . OSA (obstructive sleep apnea) 07/16/2014  . Migraines 07/16/2014  . TMJ (dislocation of temporomandibular joint) 07/16/2014  . HLD (hyperlipidemia) 03/22/2014  . S/P Nissen fundoplication May 4742 59/56/3875  . Dysphagia 01/24/2008  . DEPRESSION/ANXIETY 12/02/2007  . ESOPHAGEAL STRICTURE 12/02/2007  . GERD 12/02/2007  . Irritable bowel syndrome 12/02/2007  . Fibromyalgia 12/02/2007  . VASOVAGAL SYNCOPE 12/02/2007    HALL,RALPH PT, OCS 04/07/2015, 2:47 PM  [facility] [address]  [place], [place], [address] Phone: [phone]   Fax:  [phone]

## 2015-04-08 ENCOUNTER — Other Ambulatory Visit: Payer: Self-pay | Admitting: Family

## 2015-04-10 ENCOUNTER — Ambulatory Visit: Payer: Medicare Other | Admitting: Rehabilitation

## 2015-04-11 ENCOUNTER — Encounter: Payer: Self-pay | Admitting: Family Medicine

## 2015-04-11 ENCOUNTER — Ambulatory Visit (INDEPENDENT_AMBULATORY_CARE_PROVIDER_SITE_OTHER): Payer: Medicare Other | Admitting: Family Medicine

## 2015-04-11 VITALS — BP 126/80 | HR 84 | Temp 98.2°F | Resp 18 | Ht 64.0 in | Wt 173.0 lb

## 2015-04-11 DIAGNOSIS — J0100 Acute maxillary sinusitis, unspecified: Secondary | ICD-10-CM | POA: Diagnosis not present

## 2015-04-11 MED ORDER — DOXYCYCLINE HYCLATE 100 MG PO TABS: 100.0000 mg | ORAL_TABLET | Freq: Two times a day (BID) | ORAL | Status: DC

## 2015-04-11 MED ORDER — BENZONATATE 100 MG PO CAPS
200.0000 mg | ORAL_CAPSULE | Freq: Two times a day (BID) | ORAL | Status: DC | PRN
Start: 2015-04-11 — End: 2015-06-09

## 2015-04-11 NOTE — Progress Notes (Signed)
Pre visit review using our clinic review tool, if applicable. No additional management support is needed unless otherwise documented below in the visit note. 

## 2015-04-11 NOTE — Patient Instructions (Signed)

## 2015-04-11 NOTE — Progress Notes (Signed)
Subjective:    Patient ID: Vanessa Oliver, female    DOB: 07-25-1950, 64 y.o.   MRN: [id number]  HPI   Cough: Patient presents to acute visit with a four-day history of scratchy throat, productive green cough, congestion, rhinorrhea and history of one loose bowel movement yesterday. Patient states her eyes feel like they're draining, inner ears are itchy and driving her "crazy ". She states she feels similar to last year when she has bronchitis. She states it feels tight to breathe, but no shortness of breath. She states she does wear a CPAP at night and was unable to tolerate it last night because it would dry out her airways to quickly with this cold. She was unable to sleep an hour last night. She is taking Mucinex and drinking plenty of water and sucking on her butterscotch candies to help with cough. Patient denies sick contacts. She states she was at the beach last week, and she notices when she goes to the beach she usually gets sick. She denies fever or chills.   Nonsmoker Past Medical History  Diagnosis Date  . Arthritis   . Hyperlipidemia   . Hiatal hernia   . Fibromyalgia   . IBS (irritable bowel syndrome)   . Esophageal stricture   . Plantar fasciitis   . Rosacea   . TMJ (temporomandibular joint disorder)   . Vasovagal syncope   . Allergy     takes Singulair at night  . Fibromyalgia   . Depression     takes CYmbalta daily  . GERD (gastroesophageal reflux disease)     takes Omeprazole daily  . Family history of adverse reaction to anesthesia     oldest brother had trouble with anesthesia a long time ago but can't recall what  . History of bronchitis   . Vasovagal episode back in the 80's  . Weakness     right leg  . Joint pain   . Chronic back pain     stenosis  . Joint swelling   . Hemorrhoids    Allergies  Allergen Reactions  . Cefuroxime Axetil     Hives / "throat swelling"  . Chlorzoxazone Rash and Other (See Comments)    "breathing problems"   .  Oxycodone     Hallucinations, rash  . Penicillins Anaphylaxis  . Adhesive [Tape]     rash  . Cataflam [Diclofenac Potassium]     Lip swelling  . Clarithromycin Diarrhea  . Flagyl [Metronidazole Hcl]     ?diarrhea  . Glucosamine Hives  . Guaifenesin & Derivatives     Stomach upset  . Hydrocodone-Acetaminophen     REACTION: rash, tightening of throat  . Ketorolac Tromethamine     ?reaction  . Pentazocine     Involuntary twitching  . Pneumovax [Pneumococcal Polysaccharide Vaccine] Other (See Comments)    Redness/swelling at site, nausea  . Smz-Tmp Ds [Sulfamethoxazole W/Trimethoprim (Co-Trimoxazole)]     ?unknown reaction  . Tramadol     constipation  . Trazodone And Nefazodone Other (See Comments)    Scratchy throat / congestion   Social History   Social History  . Marital Status: Married    Spouse Name: N/A  . Number of Children: 0  . Years of Education: N/A   Occupational History  . clerical    Social History Main Topics  . Smoking status: Never Smoker   . Smokeless tobacco: Never Used  . Alcohol Use: 0.0 oz/week    0 Standard drinks or  equivalent per week     Comment: rarely  . Drug Use: No  . Sexual Activity: Yes    Birth Control/ Protection: Surgical   Other Topics Concern  . Not on file   Social History Narrative   Works as an Glass blower/designer   Married- husband is 14 years older than her   Former Dance movement psychotherapist up up [employer]   Has cats   Enjoys reading   No children     Review of Systems Negative, with the exception of above mentioned in HPI     Objective:   Physical Exam BP 126/80 mmHg  Pulse 84  Temp(Src) 98.2 F (36.8 C) (Oral)  Resp 18  Ht 5\' 4"  (1.626 m)  Wt 173 lb (78.472 kg)  BMI 29.68 kg/m2  SpO2 97%  LMP 05/14/1996 Gen: Afebrile. No acute distress. Nontoxic in appearance, well-developed, well-nourished, Caucasian female, appears fatigued. HENT: AT. [place]. Bilateral TM visualized and normal in appearance. MMM. Bilateral nares with  erythema and swelling, left greater than right. Throat without erythema or exudates. Left maxillary sinus tender to palpation. Eyes:Pupils Equal Round Reactive to light, Extraocular movements intact,  Conjunctiva without redness, discharge or icterus. Neck/lymp/endocrine: Supple, cervical lymphadenopathy present CV: RRR  Chest: CTAB, no wheeze or crackles Abd: Soft. Round. NTND. BS present. No Masses palpated.  Skin: No rashes, purpura or petechiae.  Neuro: Normal gait. PERLA. EOMi. Alert. Oriented x3. t..     Assessment & Plan:  1. Acute maxillary sinusitis, recurrence not specified - Restart flonase, continue mucinex, stay well hydrated, REST, Doxycycline. Nasal Saline three times a day. - doxycycline (VIBRA-TABS) 100 MG tablet; Take 1 tablet (100 mg total) by mouth 2 (two) times daily.  Dispense: 20 tablet; Refill: 0 - benzonatate (TESSALON PERLES) 100 MG capsule; Take 2 capsules (200 mg total) by mouth 2 (two) times daily as needed for cough.  Dispense: 20 capsule; Refill: 0 Follow-up one week if no improvement

## 2015-04-14 ENCOUNTER — Ambulatory Visit: Payer: Medicare Other | Attending: Neurosurgery | Admitting: Physical Therapy

## 2015-04-14 DIAGNOSIS — M545 Low back pain, unspecified: Secondary | ICD-10-CM

## 2015-04-14 NOTE — Therapy (Signed)
[facility] [place] [address]  [place], [place], [address] Phone: [phone]   Fax:  [phone]  Physical Therapy Treatment  Patient Details  Name: Vanessa Oliver MRN: [id number] Date of Birth: 06/18/1951 Referring Provider:  Earnie Larsson, MD  Encounter Date: 04/14/2015      PT End of Session - 04/14/15 1352    Visit Number 6   Number of Visits 12   Date for PT Re-Evaluation 04/17/15   PT Start Time 1316   PT Stop Time 1349   PT Time Calculation (min) 33 min      Past Medical History  Diagnosis Date  . Arthritis   . Hyperlipidemia   . Hiatal hernia   . Fibromyalgia   . IBS (irritable bowel syndrome)   . Esophageal stricture   . Plantar fasciitis   . Rosacea   . TMJ (temporomandibular joint disorder)   . Vasovagal syncope   . Allergy     takes Singulair at night  . Fibromyalgia   . Depression     takes CYmbalta daily  . GERD (gastroesophageal reflux disease)     takes Omeprazole daily  . Family history of adverse reaction to anesthesia     oldest brother had trouble with anesthesia a long time ago but can't recall what  . History of bronchitis   . Vasovagal episode back in the 80's  . Weakness     right leg  . Joint pain   . Chronic back pain     stenosis  . Joint swelling   . Hemorrhoids     Past Surgical History  Procedure Laterality Date  . Nissen fundoplication  4259  . Retinal laser procedure Right 02/01/2011    right eye   . Nasal septum surgery  1970  . Knee arthroscopy Right 1994    x 3  . 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 Carpal tunnel release Bilateral 2004  . Trigger finger release Bilateral 2005  . Abdominal hysterectomy  1997  . Varicose vein surgery Bilateral 2007, 2009    left 2007, right 2009  . Mallet finger Right 2011  . Vitrectomy Right 2012  . Cataract extraction Right 2013    Dr Ellison Hughs  . Carpometacarpal (cmc) fusion of thumb  2015    thumb  .  Tonsillectomy  1981  . Nissen fundoplication    . Colonoscopy    . Colonoscopy with esophagogastroduodenoscopy (egd) and esophageal dilation (ed)    . Eye surgery      scar tissue remove  . Lumbar laminectomy/decompression microdiscectomy Right 02/11/2015    Procedure: Laminectomy and Foraminotomy - Right - L3-L4;  Surgeon: Earnie Larsson, MD;  Location: [facility];  Service: Neurosurgery;  Laterality: Right;  Laminectomy and Foraminotomy - Right - L3-L4    There were no vitals filed for this visit.  Visit Diagnosis:  Bilateral low back pain without sciatica      Subjective Assessment - 04/14/15 1317    Subjective States has been feeling better with regard to back pain lately stating not as stiff upon waking in AM.  She states she has been ill past several days (sinus infection) but has been on antibiotics for past 3-4 days but still with cough.   Currently in Pain? Yes   Pain Score 2   1-2/10 "at most" at incision site   Pain Location Back   Pain Orientation Lower  TODAY'S TREATMENT TherEx -  Bridge 15x LTR 30" Unsupported LTR 10x Stretch B Hip Flexors and RF in Mod [place] Partial Curl-up 15x B FOB (55cm) tuck 15x PBall on wall squats 10x Corner Pec Stretch 2x20" Low Row 25# 15x Single Hand Low Row 15# 12x each B Knee Flexion Machine 25# 2x15 Staggered Standing punch diagonal Double Blue TB 15x each           PT Short Term Goals - 03/25/15 1355    PT SHORT TERM GOAL #1   Title pt independent with initial HEP by 03/28/15   Status Achieved           PT Long Term Goals - 03/25/15 1356    PT LONG TERM GOAL #1   Title pt independent with advanced HEP as necessary by 04/17/15   Status On-going   PT LONG TERM GOAL #2   Title pt displays B LE MMT 4+/5 or greater by 04/17/15   Status On-going   PT LONG TERM GOAL #3   Title pt with pain-free lumbar AROM WFL by 04/17/15   Status On-going   PT LONG TERM GOAL #4   Title pt able to return to performing  chores, exercise, and recreational activities without limitation by LBP by 04/17/15   Status On-going               Plan - 04/14/15 1348    Clinical Impression Statement reduced intensity and duration of today's treatment due to pt recoving from recent illness.  Pt reports noting benefit with regard to pain/mobility since beginning PT, will continue progressing current POC as able.   PT Next Visit Plan RF and Piri stretch; lumbopelvic stability; B hip strengthening   Consulted and Agree with Plan of Care Patient        Problem List Patient Active Problem List   Diagnosis Date Noted  . Sinusitis, acute 04/11/2015  . Urticarial rash 03/14/2015  . 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 Lumbar stenosis 02/11/2015  . Abdominal pain, chronic, right lower quadrant 01/07/2015  . Right hip pain 10/31/2014  . Vitamin D deficiency 10/31/2014  . Osteopenia 10/18/2014  . OSA (obstructive sleep apnea) 07/16/2014  . Migraines 07/16/2014  . TMJ (dislocation of temporomandibular joint) 07/16/2014  . HLD (hyperlipidemia) 03/22/2014  . S/P Nissen fundoplication May 1610 96/10/5407  . Dysphagia 01/24/2008  . DEPRESSION/ANXIETY 12/02/2007  . ESOPHAGEAL STRICTURE 12/02/2007  . GERD 12/02/2007  . Irritable bowel syndrome 12/02/2007  . Fibromyalgia 12/02/2007  . VASOVAGAL SYNCOPE 12/02/2007    Jessejames Steelman PT, OCS 04/14/2015, 1:53 PM  [facility] [address]  [address] [place], [place], [address] Phone: [phone]   Fax:  [phone]

## 2015-04-17 ENCOUNTER — Ambulatory Visit: Payer: Medicare Other | Admitting: Rehabilitation

## 2015-04-17 DIAGNOSIS — M545 Low back pain, unspecified: Secondary | ICD-10-CM

## 2015-04-17 NOTE — Therapy (Signed)
[facility] [address]  [place], [place], [address] Phone: [phone]   Fax:  [phone]  Physical Therapy Treatment  Patient Details  Name: Vanessa Oliver MRN: [id number] Date of Birth: 1950-08-15 Referring Provider:  Earnie Larsson, MD  Encounter Date: 04/17/2015      PT End of Session - 04/17/15 1314    Visit Number 7   Number of Visits 12   Date for PT Re-Evaluation 04/17/15   PT Start Time 0350   PT Stop Time 0938   PT Time Calculation (min) 37 min      Past Medical History  Diagnosis Date  . Arthritis   . Hyperlipidemia   . Hiatal hernia   . Fibromyalgia   . IBS (irritable bowel syndrome)   . Esophageal stricture   . Plantar fasciitis   . Rosacea   . TMJ (temporomandibular joint disorder)   . Vasovagal syncope   . Allergy     takes Singulair at night  . Fibromyalgia   . Depression     takes CYmbalta daily  . GERD (gastroesophageal reflux disease)     takes Omeprazole daily  . Family history of adverse reaction to anesthesia     oldest brother had trouble with anesthesia a long time ago but can't recall what  . History of bronchitis   . Vasovagal episode back in the 80's  . Weakness     right leg  . Joint pain   . Chronic back pain     stenosis  . Joint swelling   . Hemorrhoids     Past Surgical History  Procedure Laterality Date  . Nissen fundoplication  1829  . Retinal laser procedure Right 02/01/2011    right eye   . Nasal septum surgery  1970  . Knee arthroscopy Right 1994    x 3  . 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 Carpal tunnel release Bilateral 2004  . Trigger finger release Bilateral 2005  . Abdominal hysterectomy  1997  . Varicose vein surgery Bilateral 2007, 2009    left 2007, right 2009  . Mallet finger Right 2011  . Vitrectomy Right 2012  . Cataract extraction Right 2013    Dr Ellison Hughs  . Carpometacarpal (cmc) fusion of thumb  2015    thumb  .  Tonsillectomy  1981  . Nissen fundoplication    . Colonoscopy    . Colonoscopy with esophagogastroduodenoscopy (egd) and esophageal dilation (ed)    . Eye surgery      scar tissue remove  . Lumbar laminectomy/decompression microdiscectomy Right 02/11/2015    Procedure: Laminectomy and Foraminotomy - Right - L3-L4;  Surgeon: Earnie Larsson, MD;  Location: [facility];  Service: Neurosurgery;  Laterality: Right;  Laminectomy and Foraminotomy - Right - L3-L4    There were no vitals filed for this visit.  Visit Diagnosis:  Bilateral low back pain without sciatica      Subjective Assessment - 04/17/15 1317    Subjective Still feels so-so due to illness but the coughing is the worst. Notes just tenderness/stiffness right now and around the incision.   Currently in Pain? Yes   Pain Score 2    Pain Location Back   Pain Orientation Lower   Pain Descriptors / Indicators Tender  stiffness      TODAY'S TREATMENT TherEx -  Nustep lvl 5x3' Bridge 15x LTR 30" Unsupported LTR 10x Stretch B Hip Flexors  and RF in [place], [employer] B Piri Partial Curl-up 15x B FOB (55cm) tuck 15x B Standing Hip Flexion, Abduction, Extension Green TB 10x 1 pole assist Low Row 25# 15x, 35# 10x Corner Pec Stretch 2x20" TRX DL Squat 15x TRX Low Row 15x B Knee Flexion Machine 25# 2x10       PT Short Term Goals - 03/25/15 1355    PT SHORT TERM GOAL #1   Title pt independent with initial HEP by 03/28/15   Status Achieved           PT Long Term Goals - 03/25/15 1356    PT LONG TERM GOAL #1   Title pt independent with advanced HEP as necessary by 04/17/15   Status On-going   PT LONG TERM GOAL #2   Title pt displays B LE MMT 4+/5 or greater by 04/17/15   Status On-going   PT LONG TERM GOAL #3   Title pt with pain-free lumbar AROM WFL by 04/17/15   Status On-going   PT LONG TERM GOAL #4   Title pt able to return to performing chores, exercise, and recreational activities without limitation by LBP by  04/17/15   Status On-going               Plan - 04/17/15 1355    Clinical Impression Statement Increased intensity to pt tolerance but pt still having some difficulty with illness. Denied pain with exercises but did have SOB with pt needing to sit and rest.    PT Next Visit Plan RF and Piri stretch; lumbopelvic stability; B hip strengthening, Renewal?   Consulted and Agree with Plan of Care Patient        Problem List Patient Active Problem List   Diagnosis Date Noted  . Sinusitis, acute 04/11/2015  . Urticarial rash 03/14/2015  . 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 Lumbar stenosis 02/11/2015  . Abdominal pain, chronic, right lower quadrant 01/07/2015  . Right hip pain 10/31/2014  . Vitamin D deficiency 10/31/2014  . Osteopenia 10/18/2014  . OSA (obstructive sleep apnea) 07/16/2014  . Migraines 07/16/2014  . TMJ (dislocation of temporomandibular joint) 07/16/2014  . HLD (hyperlipidemia) 03/22/2014  . S/P Nissen fundoplication May 7026 37/85/8850  . Dysphagia 01/24/2008  . DEPRESSION/ANXIETY 12/02/2007  . ESOPHAGEAL STRICTURE 12/02/2007  . GERD 12/02/2007  . Irritable bowel syndrome 12/02/2007  . Fibromyalgia 12/02/2007  . VASOVAGAL SYNCOPE 12/02/2007    Barbette Hair, PTA 04/17/2015, 1:56 PM  [facility] [address]  [place], [place], [address] Phone: [phone]   Fax:  [phone]

## 2015-04-21 ENCOUNTER — Ambulatory Visit: Payer: Medicare Other | Admitting: Rehabilitation

## 2015-04-21 DIAGNOSIS — M545 Low back pain, unspecified: Secondary | ICD-10-CM

## 2015-04-21 NOTE — Therapy (Addendum)
[facility] [address]  [address] [place], [place], [address] Phone: [phone]   Fax:  [phone]  Physical Therapy Treatment  Patient Details  Name: Vanessa Oliver MRN: [id number] Date of Birth: 04/04/1951 Referring Provider:  O'Sullivan, Melissa, NP  Encounter Date: 04/21/2015      PT End of Session - 04/21/15 0838    Visit Number 8   Number of Visits 12   Date for PT Re-Evaluation 04/17/15   PT Start Time 0840   PT Stop Time 0921   PT Time Calculation (min) 41 min      Past Medical History  Diagnosis Date  . Arthritis   . Hyperlipidemia   . Hiatal hernia   . Fibromyalgia   . IBS (irritable bowel syndrome)   . Esophageal stricture   . Plantar fasciitis   . Rosacea   . TMJ (temporomandibular joint disorder)   . Vasovagal syncope   . Allergy     takes Singulair at night  . Fibromyalgia   . Depression     takes CYmbalta daily  . GERD (gastroesophageal reflux disease)     takes Omeprazole daily  . Family history of adverse reaction to anesthesia     oldest brother had trouble with anesthesia a long time ago but can't recall what  . History of bronchitis   . Vasovagal episode back in the 80's  . Weakness     right leg  . Joint pain   . Chronic back pain     stenosis  . Joint swelling   . Hemorrhoids     Past Surgical History  Procedure Laterality Date  . Nissen fundoplication  2012  . Retinal laser procedure Right 02/01/2011    right eye   . Nasal septum surgery  1970  . Knee arthroscopy Right 1994    x 3  . 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 Carpal tunnel release Bilateral 2004  . Trigger finger release Bilateral 2005  . Abdominal hysterectomy  1997  . Varicose vein surgery Bilateral 2007, 2009    left 2007, right 2009  . Mallet finger Right 2011  . Vitrectomy Right 2012  . Cataract extraction Right 2013    Dr Bevus  . Carpometacarpal (cmc) fusion of thumb  2015   thumb  . Tonsillectomy  1981  . Nissen fundoplication    . Colonoscopy    . Colonoscopy with esophagogastroduodenoscopy (egd) and esophageal dilation (ed)    . Eye surgery      scar tissue remove  . Lumbar laminectomy/decompression microdiscectomy Right 02/11/2015    Procedure: Laminectomy and Foraminotomy - Right - L3-L4;  Surgeon: Henry Pool, MD;  Location: [facility];  Service: Neurosurgery;  Laterality: Right;  Laminectomy and Foraminotomy - Right - L3-L4    There were no vitals filed for this visit.  Visit Diagnosis:  Bilateral low back pain without sciatica      Subjective Assessment - 04/21/15 0842    Subjective Reports painting a room that took all weekend and her legs are sore and tired today. States her back pain has been at best a 0/10, at worst a 4/10, and on average a 2-3/10 in the past week.    Currently in Pain? Yes   Pain Score 2    Pain Location Back   Pain Orientation Lower            [facility] PT Assessment - 04/21/15   0843    ROM / Strength   AROM / PROM / Strength Strength;AROM   AROM   AROM Assessment Site Lumbar   Lumbar Flexion Hands to toes  noted slight pull/stretch, no pain   Lumbar Extension 75% normal (pt states felt good)   Lumbar - Right Side Bend WFL   Lumbar - Left Side Bend WFL   Strength   Strength Assessment Site Hip   Right/Left Hip Right;Left   Right Hip Flexion 4+/5   Right Hip Extension 4-/5   Right Hip External Rotation  4/5   Right Hip Internal Rotation 5/5   Right Hip ABduction 4/5   Left Hip Flexion 4+/5   Left Hip Extension 4/5   Left Hip External Rotation 4+/5   Left Hip Internal Rotation 5/5   Left Hip ABduction 4+/5   Right/Left Knee Right;Left   Right Knee Flexion 5/5   Right Knee Extension 5/5   Left Knee Flexion 5/5   Left Knee Extension 5/5     TODAY'S TREATMENT TherEx -  Nustep lvl 5x3' MMT/ROM check Bridge 15x SL Bridges 10x LTR 30" Unsupported LTR 10x Stretch B Hip Flexors and RF in Mod Thomas,  Stretch B Piri Corner Pec Stretch 3x20" Low Row 25# 15x, 35# 10x Alt Low Row 15# 10x B Standing Hip Flexion, Abduction, Extension, adduction Green TB 12x 1 pole assist TRX DL Squat 15x TRX Low Row 15x TRX Push up 10x       PT Short Term Goals - 04/21/15 0906    PT SHORT TERM GOAL #1   Title pt independent with initial HEP by 03/28/15   Status Achieved           PT Long Term Goals - 04/21/15 0903    PT LONG TERM GOAL #1   Title pt independent with advanced HEP as necessary by 04/17/15   Status On-going   PT LONG TERM GOAL #2   Title pt displays B LE MMT 4+/5 or greater by 04/17/15  Only one 4-/5 all others 4/5 or better   Status On-going   PT LONG TERM GOAL #3   Title pt with pain-free lumbar AROM WFL by 04/17/15  All motions painfree, lacking slight motion with extension   Status Partially Met   PT LONG TERM GOAL #4   Title pt able to return to performing chores, exercise, and recreational activities without limitation by LBP by 04/17/15   Status Achieved       G-Code: Current and Discharge 40-60 limitation (CK) per clinical impression (MMT, ROM, and pain).  Goal was CK, was CL at initial eval.        Plan - 04/21/15 0922    Clinical Impression Statement Pt has made great progress over the past few weeks with decreasing pain levels, improved hip strength and lumbar AROM. She states her back pain does not limit her from performing chores, exercises or any other activites. Pt did miss some appointments due to illness and going out of town and would like to go ahead and continue with those other 4 appointments be meet unmet goals.    PT Next Visit Plan RF and Piri stretch; lumbopelvic stability; B hip strengthening   Consulted and Agree with Plan of Care Patient        Problem List Patient Active Problem List   Diagnosis Date Noted  . Sinusitis, acute 04/11/2015  . Urticarial rash 03/14/2015  . Spinal stenosis, lumbar region, with neurogenic claudication  02/11/2015  .   Lumbar stenosis 02/11/2015  . Abdominal pain, chronic, right lower quadrant 01/07/2015  . Right hip pain 10/31/2014  . Vitamin D deficiency 10/31/2014  . Osteopenia 10/18/2014  . OSA (obstructive sleep apnea) 07/16/2014  . Migraines 07/16/2014  . TMJ (dislocation of temporomandibular joint) 07/16/2014  . HLD (hyperlipidemia) 03/22/2014  . S/P Nissen fundoplication May 2012 06/18/2011  . Dysphagia 01/24/2008  . DEPRESSION/ANXIETY 12/02/2007  . ESOPHAGEAL STRICTURE 12/02/2007  . GERD 12/02/2007  . Irritable bowel syndrome 12/02/2007  . Fibromyalgia 12/02/2007  . VASOVAGAL SYNCOPE 12/02/2007    Shelby J Ducker, PTA 04/21/2015, 9:25 AM  Ralph Hall PT, OCS 04/21/2015 1:15 PM  [facility] [address]  [address] [place], [place], [address] Phone: [phone]   Fax:  [phone]     PHYSICAL THERAPY DISCHARGE SUMMARY  Visits from Start of Care: 8 of 12  Current functional level related to goals / functional outcomes: Goals partially met.  Pt last seen on 04/21/15 at which time she requested to continue with PT wanting to schedule appointments she had missed due to illness in order to work towards rehab goals.  Pt then called 04/24/15 cancelling PT and stating she was released by MD.   Remaining deficits: Mild LE weakness, mild lumbar ROM restriction, intermittent LBP (2-3/10 on AVG and 4/10 at worst)   Education / Equipment: HEP and posture correction Plan: Patient agrees to discharge.  Patient goals were partially met. Patient is being discharged due to being pleased with the current functional level.  ?????        Ralph Hall PT, OCS 04/29/2015 8:17 AM  

## 2015-04-22 ENCOUNTER — Encounter: Payer: Self-pay | Admitting: Family

## 2015-04-23 ENCOUNTER — Ambulatory Visit: Payer: Medicare Other | Admitting: Physical Therapy

## 2015-04-23 MED ORDER — PITAVASTATIN CALCIUM 2 MG PO TABS: 1.0000 mg | ORAL_TABLET | Freq: Every day | ORAL | Status: DC

## 2015-04-24 ENCOUNTER — Other Ambulatory Visit: Payer: Self-pay

## 2015-04-24 ENCOUNTER — Telehealth: Payer: Self-pay | Admitting: *Deleted

## 2015-04-24 MED ORDER — PITAVASTATIN CALCIUM 2 MG PO TABS: 1.0000 mg | ORAL_TABLET | Freq: Every day | ORAL | Status: DC

## 2015-04-24 NOTE — Telephone Encounter (Signed)
PA for Livalo 2 mg tabs initiated through [employer]. Awaiting determination. JG//CMA

## 2015-04-28 NOTE — Telephone Encounter (Signed)
Approved.  

## 2015-04-29 ENCOUNTER — Ambulatory Visit: Payer: Medicare Other

## 2015-05-01 ENCOUNTER — Ambulatory Visit: Payer: Medicare Other

## 2015-05-05 ENCOUNTER — Ambulatory Visit: Payer: Medicare Other | Admitting: Physical Therapy

## 2015-05-07 DIAGNOSIS — M2021 Hallux rigidus, right foot: Secondary | ICD-10-CM | POA: Diagnosis not present

## 2015-05-07 DIAGNOSIS — M2011 Hallux valgus (acquired), right foot: Secondary | ICD-10-CM | POA: Diagnosis not present

## 2015-05-07 DIAGNOSIS — S8992XD Unspecified injury of left lower leg, subsequent encounter: Secondary | ICD-10-CM | POA: Diagnosis not present

## 2015-05-07 DIAGNOSIS — M76821 Posterior tibial tendinitis, right leg: Secondary | ICD-10-CM | POA: Diagnosis not present

## 2015-05-08 ENCOUNTER — Ambulatory Visit: Payer: Medicare Other

## 2015-05-08 DIAGNOSIS — M79641 Pain in right hand: Secondary | ICD-10-CM | POA: Diagnosis not present

## 2015-05-08 DIAGNOSIS — S60221A Contusion of right hand, initial encounter: Secondary | ICD-10-CM | POA: Diagnosis not present

## 2015-05-15 ENCOUNTER — Ambulatory Visit: Payer: Medicare Other | Attending: Orthopedic Surgery | Admitting: Physical Therapy

## 2015-05-15 DIAGNOSIS — R262 Difficulty in walking, not elsewhere classified: Secondary | ICD-10-CM | POA: Diagnosis not present

## 2015-05-15 DIAGNOSIS — M25571 Pain in right ankle and joints of right foot: Secondary | ICD-10-CM | POA: Insufficient documentation

## 2015-05-15 NOTE — Therapy (Signed)
[facility] [address]  [place], [place], [address] Phone: [phone]   Fax:  [phone]  Physical Therapy Evaluation  Patient Details  Name: Vanessa Oliver MRN: [id number] Date of Birth: [birth year] Referring Provider: Lloyd Huger, MD  Encounter Date: 05/15/2015      PT End of Session - 05/15/15 0951    Visit Number 1   Number of Visits 12   Date for PT Re-Evaluation 06/26/15   PT Start Time 0930   PT Stop Time 1021   PT Time Calculation (min) 51 min   Activity Tolerance Patient tolerated treatment well   Behavior During Therapy [facility] for tasks assessed/performed      Past Medical History  Diagnosis Date  . Arthritis   . Hyperlipidemia   . Hiatal hernia   . Fibromyalgia   . IBS (irritable bowel syndrome)   . Esophageal stricture   . Plantar fasciitis   . Rosacea   . TMJ (temporomandibular joint disorder)   . Vasovagal syncope   . Allergy     takes Singulair at night  . Fibromyalgia   . Depression     takes CYmbalta daily  . GERD (gastroesophageal reflux disease)     takes Omeprazole daily  . Family history of adverse reaction to anesthesia     oldest brother had trouble with anesthesia a long time ago but can't recall what  . History of bronchitis   . Vasovagal episode back in the 80's  . Weakness     right leg  . Joint pain   . Chronic back pain     stenosis  . Joint swelling   . Hemorrhoids     Past Surgical History  Procedure Laterality Date  . Nissen fundoplication  9485  . Retinal laser procedure Right 02/01/2011    right eye   . Nasal septum surgery  1970  . Knee arthroscopy Right 1994    x 3  . 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 Carpal tunnel release Bilateral 2004  . Trigger finger release Bilateral 2005  . Abdominal hysterectomy  1997  . Varicose vein surgery Bilateral 2007, 2009    left 2007, right 2009  . Mallet finger Right 2011  . Vitrectomy  Right 2012  . Cataract extraction Right 2013    Dr Ellison Hughs  . Carpometacarpal (cmc) fusion of thumb  2015    thumb  . Tonsillectomy  1981  . Nissen fundoplication    . Colonoscopy    . Colonoscopy with esophagogastroduodenoscopy (egd) and esophageal dilation (ed)    . Eye surgery      scar tissue remove  . Lumbar laminectomy/decompression microdiscectomy Right 02/11/2015    Procedure: Laminectomy and Foraminotomy - Right - L3-L4;  Surgeon: Earnie Larsson, MD;  Location: [place] [facility];  Service: Neurosurgery;  Laterality: Right;  Laminectomy and Foraminotomy - Right - L3-L4    There were no vitals filed for this visit.  Visit Diagnosis:  Pain in joint, ankle and foot, right  Difficulty walking      Subjective Assessment - 05/15/15 0937    Subjective Patient reports onset of problems in September with patient stating initially had pain in great toe which is now resolved. In October pain became more localized to medial ankle and arch of foot and has progressively worsened. Patient states pain worst with inactivity, elevation and especially when trying to sleep at night. Patient reports ankle/foot  x-ray with no fracture or bony abnormality observed per patient report but states MD suspects partial tear fo tendon or ligament. Patient admits to working through the pain due to in process of moving her mother in with her.   Limitations Walking   How long can you walk comfortably? Uncomfortable but does not let it stop her from walking (averages 8000-9000 steps per day).   Patient Stated Goals "No more pain in the right foot"   Currently in Pain? Yes   Pain Score 4   Least 4/10, Avg 8/10, Worst 10/10   Pain Location Ankle  & arch of foot   Pain Orientation Right;Medial   Pain Descriptors / Indicators Aching;Burning  Aches in ankle, burns in arch   Pain Type Acute pain   Pain Onset More than a month ago  03/2015   Pain Frequency Constant   Aggravating Factors  Elevation, walking   Pain Relieving  Factors Dependent position with foot flat on floor   Effect of Pain on Daily Activities Makes walking uncomfortable            [facility] PT Assessment - 05/15/15 0930    Assessment   Medical Diagnosis Right posterior tibial tendinitis   Referring Provider Lloyd Huger, MD   Onset Date/Surgical Date --  03/2015   Next MD Visit January 2017   Prior Therapy none   Precautions   Required Braces or Orthoses Other Brace/Splint   Other Brace/Splint Right ankle air cast - Per patient, she is to wear brace until she sees the MD again   Balance Screen   Has the patient fallen in the past 6 months No   Has the patient had a decrease in activity level because of a fear of falling?  No   Is the patient reluctant to leave their home because of a fear of falling?  No   Prior Function   Vocation Retired   Leisure remains very busy around the house due to pt's mother moving in with her next week   Observation/Other Assessments   Focus on Therapeutic Outcomes (FOTO)  Foot - 41% limitation (57% limitation); Predicted 57% (43% limitation)   Posture/Postural Control   Posture Comments pes planus   ROM / Strength   AROM / PROM / Strength AROM;PROM;Strength   AROM   AROM Assessment Site Ankle   Right/Left Ankle Right;Left   Right Ankle Dorsiflexion 1   Right Ankle Plantar Flexion 60   Right Ankle Inversion 12   Right Ankle Eversion 11   Left Ankle Dorsiflexion 16   Left Ankle Plantar Flexion 66   Left Ankle Inversion 22   Left Ankle Eversion 21   PROM   PROM Assessment Site Ankle   Right/Left Ankle Right   Right Ankle Dorsiflexion 14   Right Ankle Plantar Flexion 64   Right Ankle Inversion 22   Right Ankle Eversion 20   Strength   Strength Assessment Site Ankle   Right/Left Ankle Right;Left   Right Ankle Dorsiflexion 4-/5   Right Ankle Plantar Flexion 4/5   Right Ankle Inversion 4-/5   Right Ankle Eversion 4-/5   Left Ankle Dorsiflexion 5/5   Left Ankle Plantar Flexion 5/5   Left  Ankle Inversion 5/5   Left Ankle Eversion 5/5   Ambulation/Gait   Gait Pattern Step-through pattern;Decreased stance time - right;Decreased weight shift to right;Antalgic        Today's Treatment  TherEx Rt Gastroc & soleus stretches in standing 3x20" Rt ankle  IV/EV isometrics 10x3"            PT Education - 06/12/15 1306    Education provided Yes   Education Details Initial foot/ankle HEP   Person(s) Educated Patient   Methods Explanation;Demonstration;Handout   Comprehension Verbalized understanding;Returned demonstration          PT Short Term Goals - 04/21/15 0906    PT SHORT TERM GOAL #1   Title pt independent with initial HEP by 03/28/15   Status Achieved           PT Long Term Goals - June 12, 2015 1313    PT LONG TERM GOAL #1   Title Patient will be independent with advanced HEP as necessary (06/26/15)   Time 6   Period Weeks   Status New   PT LONG TERM GOAL #2   Title Patient will demonstrate right ankle strength 4/5 or greater without pain for improved ankle stability (06/26/15)   Time 6   Period Weeks   Status New   PT LONG TERM GOAL #3   Title Patient will be able to walk for 20 minutes without increased pain (06/26/15)   Time 6   Period Weeks   Status New   PT LONG TERM GOAL #4   Title Patient able to return to performing chores, exercise, and recreational activities without limitation by foot/ankle pain (06/26/15)   Time [place] - 2015/06/12 1716    Clinical Impression Statement Patient recently completed therapy for low back program with good results. Now returns to therapy for right posterior tibial tendinitis of ~2 month duration. Patient presents to therapy wearing aircast ankle brace and walking with pronounced limp. Assessment revealed right ankle PROM WNL but AROM limited due to pain and weakness with overall ankle strength grossly 4-/5.  Patients reports pain aggravated by elevating  right foot and walking but has not been able to limit walking secondary to preparing to move her mother in with her this week.   Pt will benefit from skilled therapeutic intervention in order to improve on the following deficits Pain;Impaired flexibility;Decreased range of motion;Decreased strength;Difficulty walking;Abnormal gait;Decreased activity tolerance;Decreased balance   Rehab Potential Good   PT Frequency 2x / week   PT Duration 6 weeks   PT Treatment/Interventions Manual techniques;Passive range of motion;Therapeutic exercise;Therapeutic activities;Balance training;Neuromuscular re-education;Gait training;Ultrasound;Electrical Stimulation;Cryotherapy;Vasopneumatic Device;Iontophoresis 4mg /ml Dexamethasone;Taping;Dry needling;Patient/family education   PT Next Visit Plan Review HEP stretches & isometric strengthening; Right ankle ROM and strengthening as pain allows; Manual therapy, especially cross friction massage; Modalities &/or taping PRN   Consulted and Agree with Plan of Care Patient          G-Codes - 06/12/15 1726    Functional Assessment Tool Used Foot FOTO = 41% (59% limitation)   Functional Limitation Mobility: Walking and moving around   Mobility: Walking and Moving Around Current Status (O7096) At least 40 percent but less than 60 percent impaired, limited or restricted   Mobility: Walking and Moving Around Goal Status [phone]) At least 40 percent but less than 60 percent impaired, limited or restricted  Predicted FOTO = 57% (43% limitation)       Problem List Patient Active Problem List   Diagnosis Date Noted  . Sinusitis, acute 04/11/2015  . Urticarial rash 03/14/2015  . 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 Lumbar stenosis 02/11/2015  . Abdominal pain,  chronic, right lower quadrant 01/07/2015  . Right hip pain 10/31/2014  . Vitamin D deficiency 10/31/2014  . Osteopenia 10/18/2014  . OSA (obstructive sleep apnea)  07/16/2014  . Migraines 07/16/2014  . TMJ (dislocation of temporomandibular joint) 07/16/2014  . HLD (hyperlipidemia) 03/22/2014  . S/P Nissen fundoplication May 6283 66/29/4765  . Dysphagia 01/24/2008  . DEPRESSION/ANXIETY 12/02/2007  . ESOPHAGEAL STRICTURE 12/02/2007  . GERD 12/02/2007  . Irritable bowel syndrome 12/02/2007  . Fibromyalgia 12/02/2007  . VASOVAGAL SYNCOPE 12/02/2007    Percival Spanish, PT, MPT 05/15/2015, 5:27 PM  [facility] [address]  [place], [place], [address] Phone: [phone]   Fax:  [phone]  Name: Vanessa Oliver MRN: [id number] Date of Birth: [birth year]

## 2015-05-18 ENCOUNTER — Encounter: Payer: Self-pay | Admitting: Family

## 2015-05-19 ENCOUNTER — Ambulatory Visit: Payer: Self-pay | Admitting: Allergy and Immunology

## 2015-05-19 ENCOUNTER — Encounter: Payer: Self-pay | Admitting: Rehabilitation

## 2015-05-19 ENCOUNTER — Ambulatory Visit: Payer: Medicare Other | Admitting: Rehabilitation

## 2015-05-19 DIAGNOSIS — R262 Difficulty in walking, not elsewhere classified: Secondary | ICD-10-CM | POA: Diagnosis not present

## 2015-05-19 DIAGNOSIS — M25571 Pain in right ankle and joints of right foot: Secondary | ICD-10-CM | POA: Diagnosis not present

## 2015-05-19 MED ORDER — OMEPRAZOLE 40 MG PO CPDR: 40.0000 mg | DELAYED_RELEASE_CAPSULE | Freq: Two times a day (BID) | ORAL | Status: DC

## 2015-05-19 NOTE — Telephone Encounter (Signed)
Melissa-- looks like Dr Henrene Pastor prescribed this medication last.  Is it ok for [place] to send refill?

## 2015-05-19 NOTE — Therapy (Signed)
[facility] [address]  [place], [place], [address] Phone: [phone]   Fax:  [phone]  Physical Therapy Treatment  Patient Details  Name: Vanessa Oliver MRN: [id number] Date of Birth: 1950-08-10 Referring Provider: Lloyd Huger, MD  Encounter Date: 05/19/2015      PT End of Session - 05/19/15 0916    Visit Number 2   Number of Visits 12   Date for PT Re-Evaluation 06/26/15   PT Start Time 0845   PT Stop Time 0925   PT Time Calculation (min) 40 min   Activity Tolerance Patient tolerated treatment well      Past Medical History  Diagnosis Date  . Arthritis   . Hyperlipidemia   . Hiatal hernia   . Fibromyalgia   . IBS (irritable bowel syndrome)   . Esophageal stricture   . Plantar fasciitis   . Rosacea   . TMJ (temporomandibular joint disorder)   . Vasovagal syncope   . Allergy     takes Singulair at night  . Fibromyalgia   . Depression     takes CYmbalta daily  . GERD (gastroesophageal reflux disease)     takes Omeprazole daily  . Family history of adverse reaction to anesthesia     oldest brother had trouble with anesthesia a long time ago but can't recall what  . History of bronchitis   . Vasovagal episode back in the 80's  . Weakness     right leg  . Joint pain   . Chronic back pain     stenosis  . Joint swelling   . Hemorrhoids     Past Surgical History  Procedure Laterality Date  . Nissen fundoplication  5009  . Retinal laser procedure Right 02/01/2011    right eye   . Nasal septum surgery  1970  . Knee arthroscopy Right 1994    x 3  . 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 Carpal tunnel release Bilateral 2004  . Trigger finger release Bilateral 2005  . Abdominal hysterectomy  1997  . Varicose vein surgery Bilateral 2007, 2009    left 2007, right 2009  . Mallet finger Right 2011  . Vitrectomy Right 2012  . Cataract extraction Right 2013    Dr Ellison Hughs  .  Carpometacarpal (cmc) fusion of thumb  2015    thumb  . Tonsillectomy  1981  . Nissen fundoplication    . Colonoscopy    . Colonoscopy with esophagogastroduodenoscopy (egd) and esophageal dilation (ed)    . Eye surgery      scar tissue remove  . Lumbar laminectomy/decompression microdiscectomy Right 02/11/2015    Procedure: Laminectomy and Foraminotomy - Right - L3-L4;  Surgeon: Earnie Larsson, MD;  Location: [place];  Service: Neurosurgery;  Laterality: Right;  Laminectomy and Foraminotomy - Right - L3-L4    There were no vitals filed for this visit.  Visit Diagnosis:  Pain in joint, ankle and foot, right  Difficulty walking      Subjective Assessment - 05/19/15 0843    Subjective A little sore.  maybe did too many exercises.  Brought in inserts and ball to have PT check   Currently in Pain? Yes   Pain Score 3    Pain Location Ankle   Pain Orientation Right   Pain Descriptors / Indicators Aching   Pain Type Acute pain   Aggravating Factors  elevation, walking   Pain  Relieving Factors foot flat on floor                         [facility] Adult PT Treatment/Exercise - 05/19/15 0001    Exercises   Exercises Ankle   Modalities   Modalities Ultrasound   Ultrasound   Ultrasound Location R medial ankle/arch/posterior tib   Ultrasound Parameters 3.3MHz, x54min, 50%   Ultrasound Goals Pain   Manual Therapy   Manual Therapy Soft tissue mobilization   Soft tissue mobilization cross friction R posterior tibialis and distal to proximal; PROM   Ankle Exercises: Stretches   Soleus Stretch 2 reps;30 seconds   Gastroc Stretch 2 reps;30 seconds   Ankle Exercises: Seated   BAPS Sitting;Level 2;15 reps   BAPS Limitations R/L, F/B, and circles                PT Education - 05/19/15 0916    Education Details ice massage, trying a softer orthotic to decrease toe pain   Person(s) Educated Patient   Methods Explanation          PT Short Term Goals - 04/21/15  0906    PT SHORT TERM GOAL #1   Title pt independent with initial HEP by 03/28/15   Status Achieved           PT Long Term Goals - 05/15/15 1313    PT LONG TERM GOAL #1   Title Patient will be independent with advanced HEP as necessary (06/26/15)   Time 6   Period Weeks   Status New   PT LONG TERM GOAL #2   Title Patient will demonstrate right ankle strength 4/5 or greater without pain for improved ankle stability (06/26/15)   Time 6   Period Weeks   Status New   PT LONG TERM GOAL #3   Title Patient will be able to walk for 20 minutes without increased pain (06/26/15)   Time 6   Period Weeks   Status New   PT LONG TERM GOAL #4   Title Patient able to return to performing chores, exercise, and recreational activities without limitation by foot/ankle pain (06/26/15)   Time 6   Period Weeks   Status New               Plan - 05/19/15 0916    Clinical Impression Statement +2 ttp posterior tibialis tendon full length, most just proximal to the medial malleolus, good tolerance of new exercises.    PT Next Visit Plan Review HEP stretches & isometric strengthening; Right ankle ROM and strengthening as pain allows; Manual therapy, especially cross friction massage; Modalities &/or taping PRN        Problem List Patient Active Problem List   Diagnosis Date Noted  . Sinusitis, acute 04/11/2015  . Urticarial rash 03/14/2015  . 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 Lumbar stenosis 02/11/2015  . Abdominal pain, chronic, right lower quadrant 01/07/2015  . Right hip pain 10/31/2014  . Vitamin D deficiency 10/31/2014  . Osteopenia 10/18/2014  . OSA (obstructive sleep apnea) 07/16/2014  . Migraines 07/16/2014  . TMJ (dislocation of temporomandibular joint) 07/16/2014  . HLD (hyperlipidemia) 03/22/2014  . S/P Nissen fundoplication May 3295 18/84/1660  . Dysphagia 01/24/2008  . DEPRESSION/ANXIETY 12/02/2007  . ESOPHAGEAL STRICTURE  12/02/2007  . GERD 12/02/2007  . Irritable bowel syndrome 12/02/2007  . Fibromyalgia 12/02/2007  . VASOVAGAL SYNCOPE 12/02/2007    Stark Bray, DPT,CMP 05/19/2015, 9:24  AM  [facility] [address]  [place], [place], [address] Phone: [phone]   Fax:  [phone]  Name: Vanessa Oliver MRN: [id number] Date of Birth: 1951-04-15

## 2015-05-21 ENCOUNTER — Telehealth: Payer: Self-pay | Admitting: *Deleted

## 2015-05-21 NOTE — Telephone Encounter (Signed)
Vanessa Oliver-- received fax from [facility] that Rx of omeprazole 40mg  twice a day did not match quantity of #360. They want to verify Rx. Pt's email requested 20mg  twice a day.  Please adivse what dose and directions pt should be taking?

## 2015-05-22 MED ORDER — OMEPRAZOLE 40 MG PO CPDR: 40.0000 mg | DELAYED_RELEASE_CAPSULE | Freq: Two times a day (BID) | ORAL | Status: DC

## 2015-05-22 NOTE — Telephone Encounter (Signed)
Spoke with Jon Gills at [facility] and verified Omeprazole 40mg  twice a day, #180 x 1 refill. They will get Rx shipped to pt.   From: [name]   Created: 05/22/2015 1:58 PM    *-*-*This message has not been handled.*-*-*   Hi Melissa,  Yes you are right the prescription should be 40 mg twice a day. Sorry for the delay but I been in [place] moving my mom down to my place today. Have a great day.

## 2015-05-22 NOTE — Telephone Encounter (Signed)
See phone note

## 2015-05-22 NOTE — Telephone Encounter (Signed)
Will continue what she has been doing, could you please verify with pt which dose she is using?

## 2015-05-22 NOTE — Telephone Encounter (Signed)
Left detailed message on cell# to check current bottle and let us know dose and directions and how she is taking it.

## 2015-05-27 ENCOUNTER — Ambulatory Visit: Payer: Medicare Other | Admitting: Physical Therapy

## 2015-05-27 DIAGNOSIS — M25571 Pain in right ankle and joints of right foot: Secondary | ICD-10-CM | POA: Diagnosis not present

## 2015-05-27 DIAGNOSIS — R262 Difficulty in walking, not elsewhere classified: Secondary | ICD-10-CM | POA: Diagnosis not present

## 2015-05-27 NOTE — Therapy (Signed)
[facility] [address]  [place], [place], [address] Phone: [phone]   Fax:  [phone]  Physical Therapy Treatment  Patient Details  Name: Vanessa Oliver MRN: [id number] Date of Birth: [birth year] Referring Provider: Lloyd Huger, MD  Encounter Date: 05/27/2015      PT End of Session - 05/27/15 1403    Visit Number 3   Number of Visits 12   Date for PT Re-Evaluation 06/26/15   PT Start Time [id number]   PT Stop Time 1403   PT Time Calculation (min) 48 min   Activity Tolerance Patient tolerated treatment well   Behavior During Therapy [facility] for tasks assessed/performed      Past Medical History  Diagnosis Date  . Arthritis   . Hyperlipidemia   . Hiatal hernia   . Fibromyalgia   . IBS (irritable bowel syndrome)   . Esophageal stricture   . Plantar fasciitis   . Rosacea   . TMJ (temporomandibular joint disorder)   . Vasovagal syncope   . Allergy     takes Singulair at night  . Fibromyalgia   . Depression     takes CYmbalta daily  . GERD (gastroesophageal reflux disease)     takes Omeprazole daily  . Family history of adverse reaction to anesthesia     oldest brother had trouble with anesthesia a long time ago but can't recall what  . History of bronchitis   . Vasovagal episode back in the 80's  . Weakness     right leg  . Joint pain   . Chronic back pain     stenosis  . Joint swelling   . Hemorrhoids     Past Surgical History  Procedure Laterality Date  . Nissen fundoplication  0000000  . Retinal laser procedure Right 02/01/2011    right eye   . Nasal septum surgery  1970  . Knee arthroscopy Right 1994    x 3  . 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 Carpal tunnel release Bilateral 2004  . Trigger finger release Bilateral 2005  . Abdominal hysterectomy  1997  . Varicose vein surgery Bilateral 2007, 2009    left 2007, right 2009  . Mallet finger Right 2011  . Vitrectomy  Right 2012  . Cataract extraction Right 2013    Dr Ellison Hughs  . Carpometacarpal (cmc) fusion of thumb  2015    thumb  . Tonsillectomy  1981  . Nissen fundoplication    . Colonoscopy    . Colonoscopy with esophagogastroduodenoscopy (egd) and esophageal dilation (ed)    . Eye surgery      scar tissue remove  . Lumbar laminectomy/decompression microdiscectomy Right 02/11/2015    Procedure: Laminectomy and Foraminotomy - Right - L3-L4;  Surgeon: Earnie Larsson, MD;  Location: [facility];  Service: Neurosurgery;  Laterality: Right;  Laminectomy and Foraminotomy - Right - L3-L4    There were no vitals filed for this visit.  Visit Diagnosis:  Pain in joint, ankle and foot, right  Difficulty walking      Subjective Assessment - 05/27/15 1352    Subjective Patient reports a trip and fall  in a parking deck this morning while taking her husband to a MD appt. Reports trauma to right knee and elbow with open scrape on right elbow and mild scrape on left elbow. Denies new trauma to right foot or ankle. Does state that pain is still worst  in the morning (8/10 this morning) and usually eases off as the day progresses.   Currently in Pain? Yes   Pain Score 2    Pain Location Ankle   Pain Orientation Right                [facility] Adult PT Treatment/Exercise - 05/27/15 1315    Exercises   Exercises Ankle   Modalities   Modalities Ultrasound   Ultrasound   Ultrasound Location R medial ankle/arch/posterior tib   Ultrasound Parameters 3.3MHz, 50%, 0.5 W/cm2 x8'   Ultrasound Goals Pain   Manual Therapy   Manual Therapy Soft tissue mobilization   Soft tissue mobilization cross friction R posterior tibialis and distal to proximal; PROM   Ankle Exercises: Seated   BAPS Sitting;Level 2;15 reps   BAPS Limitations R/L, F/B, and circles CW/CCW   Ankle Exercises: Supine   T-Band Seated PF/DF/IV/EV with yellow TB 10x3"                  PT Short Term Goals - 04/21/15 0906    PT SHORT TERM  GOAL #1   Title pt independent with initial HEP by 03/28/15   Status Achieved           PT Long Term Goals - 05/27/15 1421    PT LONG TERM GOAL #1   Title Patient will be independent with advanced HEP as necessary (06/26/15)   Status On-going   PT LONG TERM GOAL #2   Title Patient will demonstrate right ankle strength 4/5 or greater without pain for improved ankle stability (06/26/15)   Status On-going   PT LONG TERM GOAL #3   Title Patient will be able to walk for 20 minutes without increased pain (06/26/15)   Status On-going   PT LONG TERM GOAL #4   Title Patient able to return to performing chores, exercise, and recreational activities without limitation by foot/ankle pain (06/26/15)   Status On-going               Plan - 05/27/15 1413    Clinical Impression Statement Patient reporting decreased pain with self-massage at home and [place] + cross friction during therapy. Able to tolerate progression from isometric to theraband resistance with yellow TB - added to HEP.   PT Next Visit Plan Review TB HEP exercises; Right ankle ROM and strengthening as pain allows; Manual therapy, especially cross friction massage; Modalities &/or taping PRN   Consulted and Agree with Plan of Care Patient        Problem List Patient Active Problem List   Diagnosis Date Noted  . Sinusitis, acute 04/11/2015  . Urticarial rash 03/14/2015  . 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 Lumbar stenosis 02/11/2015  . Abdominal pain, chronic, right lower quadrant 01/07/2015  . Right hip pain 10/31/2014  . Vitamin D deficiency 10/31/2014  . Osteopenia 10/18/2014  . OSA (obstructive sleep apnea) 07/16/2014  . Migraines 07/16/2014  . TMJ (dislocation of temporomandibular joint) 07/16/2014  . HLD (hyperlipidemia) 03/22/2014  . S/P Nissen fundoplication May 0000000 0000000  . Dysphagia 01/24/2008  . DEPRESSION/ANXIETY 12/02/2007  . ESOPHAGEAL STRICTURE  12/02/2007  . GERD 12/02/2007  . Irritable bowel syndrome 12/02/2007  . Fibromyalgia 12/02/2007  . VASOVAGAL SYNCOPE 12/02/2007    Percival Spanish, PT, MPT 05/27/2015, 2:23 PM  [facility] [address]  [address] [place], [place], [address] Phone: [phone]   Fax:  [phone]  Name:  Vanessa Oliver MRN: [id number] Date of Birth: 1950-08-12

## 2015-05-29 ENCOUNTER — Ambulatory Visit: Payer: Medicare Other | Admitting: Physical Therapy

## 2015-05-29 DIAGNOSIS — M25571 Pain in right ankle and joints of right foot: Secondary | ICD-10-CM

## 2015-05-29 DIAGNOSIS — R262 Difficulty in walking, not elsewhere classified: Secondary | ICD-10-CM

## 2015-05-29 NOTE — Therapy (Signed)
[facility] [address]  [place], [place], [address] Phone: [phone]   Fax:  [phone]  Physical Therapy Treatment  Patient Details  Name: Vanessa Oliver MRN: [id number] Date of Birth: 1951/06/01 Referring Provider: Lloyd Huger, MD  Encounter Date: 05/29/2015      PT End of Session - 05/29/15 1541    Visit Number 4   Number of Visits 12   Date for PT Re-Evaluation 06/26/15   PT Start Time [id number]   PT Stop Time 1619   PT Time Calculation (min) 49 min   Activity Tolerance Patient tolerated treatment well   Behavior During Therapy [facility] for tasks assessed/performed      Past Medical History  Diagnosis Date  . Arthritis   . Hyperlipidemia   . Hiatal hernia   . Fibromyalgia   . IBS (irritable bowel syndrome)   . Esophageal stricture   . Plantar fasciitis   . Rosacea   . TMJ (temporomandibular joint disorder)   . Vasovagal syncope   . Allergy     takes Singulair at night  . Fibromyalgia   . Depression     takes CYmbalta daily  . GERD (gastroesophageal reflux disease)     takes Omeprazole daily  . Family history of adverse reaction to anesthesia     oldest brother had trouble with anesthesia a long time ago but can't recall what  . History of bronchitis   . Vasovagal episode back in the 80's  . Weakness     right leg  . Joint pain   . Chronic back pain     stenosis  . Joint swelling   . Hemorrhoids     Past Surgical History  Procedure Laterality Date  . Nissen fundoplication  0000000  . Retinal laser procedure Right 02/01/2011    right eye   . Nasal septum surgery  1970  . Knee arthroscopy Right 1994    x 3  . 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 Carpal tunnel release Bilateral 2004  . Trigger finger release Bilateral 2005  . Abdominal hysterectomy  1997  . Varicose vein surgery Bilateral 2007, 2009    left 2007, right 2009  . Mallet finger Right 2011  . Vitrectomy  Right 2012  . Cataract extraction Right 2013    Dr Ellison Hughs  . Carpometacarpal (cmc) fusion of thumb  2015    thumb  . Tonsillectomy  1981  . Nissen fundoplication    . Colonoscopy    . Colonoscopy with esophagogastroduodenoscopy (egd) and esophageal dilation (ed)    . Eye surgery      scar tissue remove  . Lumbar laminectomy/decompression microdiscectomy Right 02/11/2015    Procedure: Laminectomy and Foraminotomy - Right - L3-L4;  Surgeon: Earnie Larsson, MD;  Location: [facility];  Service: Neurosurgery;  Laterality: Right;  Laminectomy and Foraminotomy - Right - L3-L4    There were no vitals filed for this visit.  Visit Diagnosis:  Pain in joint, ankle and foot, right  Difficulty walking      Subjective Assessment - 05/29/15 1533    Subjective Patient reports pain continues to be worst at night, up to 10/10 at times, and ofen wakes her up. States less pain in ankle and calf today with pain more localized in arch of foot. Has been busy doing alot of walking around today while showing her mother around town. Reports tried using orthotics in  place of aircast but found the aircast was better as far as pain goes. Also reporting increased overall soreness from her fall earlier this week.   Currently in Pain? Yes   Pain Score --  3.5/10   Pain Location Foot  Arch   Pain Orientation Right;Medial                [facility] Adult PT Treatment/Exercise - 05/29/15 1530    Exercises   Exercises Ankle   Modalities   Modalities Vasopneumatic   Vasopneumatic   Number Minutes Vasopneumatic  15 minutes   Vasopnuematic Location  Ankle   Vasopneumatic Pressure Medium   Vasopneumatic Temperature  Lowest   Ankle Exercises: Stretches   Other Stretch Rt ankle rocker stretch 3x20"   Ankle Exercises: Standing   Heel Raises 10 reps;3 seconds   Heel Raises Limitations at back of UBE; DL raise, increased emphasis on Rt with eccentric lowering   Ankle Exercises: Seated   BAPS Sitting;Level 3;15 reps    BAPS Limitations R/L, F/B, and circles CW/CCW   Ankle Exercises: Supine   T-Band Seated PF/DF/IV/EV with yellow TB 10x3"                PT Education - 05/29/15 1557    Education provided Yes   Education Details Rolling foot massage over soup can or frozen bottle of water   Person(s) Educated Patient   Methods Explanation   Comprehension Verbalized understanding          PT Short Term Goals - 04/21/15 0906    PT SHORT TERM GOAL #1   Title pt independent with initial HEP by 03/28/15   Status Achieved           PT Long Term Goals - 05/27/15 1421    PT LONG TERM GOAL #1   Title Patient will be independent with advanced HEP as necessary (06/26/15)   Status On-going   PT LONG TERM GOAL #2   Title Patient will demonstrate right ankle strength 4/5 or greater without pain for improved ankle stability (06/26/15)   Status On-going   PT LONG TERM GOAL #3   Title Patient will be able to walk for 20 minutes without increased pain (06/26/15)   Status On-going   PT LONG TERM GOAL #4   Title Patient able to return to performing chores, exercise, and recreational activities without limitation by foot/ankle pain (06/26/15)   Status On-going               Plan - 05/29/15 1606    Clinical Impression Statement Reviewed theraband HEP exercises and clarified technique to achieve desired movement pattern. Progressed BAPS level and added standing heel raises with emphasis on right eccentric lowering with good patient tolerance. Patient reporting continued relief from massage at home and reports pain more localized to arch of foot therefore instructed patient in use of soup can or frozen water bottle for rolling massage in arch of foot. Patient declined cross-friction massage today secondary to completing it herself at home, therefore utiliized vasopneumatic compression at med pressure and lowest time to reduce any inflammation from exercises or increased walking today.   PT  Next Visit Plan Right ankle ROM and strengthening as pain allows; Manual therapy, especially cross friction massage; Modalities -PRN, Taping as adjunct or alterantive to aircast?   Consulted and Agree with Plan of Care Patient        Problem List Patient Active Problem List   Diagnosis Date Noted  . Sinusitis,  acute 04/11/2015  . Urticarial rash 03/14/2015  . 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 Lumbar stenosis 02/11/2015  . Abdominal pain, chronic, right lower quadrant 01/07/2015  . Right hip pain 10/31/2014  . Vitamin D deficiency 10/31/2014  . Osteopenia 10/18/2014  . OSA (obstructive sleep apnea) 07/16/2014  . Migraines 07/16/2014  . TMJ (dislocation of temporomandibular joint) 07/16/2014  . HLD (hyperlipidemia) 03/22/2014  . S/P Nissen fundoplication May 0000000 0000000  . Dysphagia 01/24/2008  . DEPRESSION/ANXIETY 12/02/2007  . ESOPHAGEAL STRICTURE 12/02/2007  . GERD 12/02/2007  . Irritable bowel syndrome 12/02/2007  . Fibromyalgia 12/02/2007  . VASOVAGAL SYNCOPE 12/02/2007    Percival Spanish, PT, MPT 05/29/2015, 4:20 PM  [facility] [address]  [place], [place], [address] Phone: [phone]   Fax:  [phone]  Name: Vanessa Oliver MRN: [id number] Date of Birth: 18-Nov-1950

## 2015-06-02 ENCOUNTER — Ambulatory Visit: Payer: Medicare Other | Admitting: Physical Therapy

## 2015-06-02 ENCOUNTER — Encounter: Payer: Self-pay | Admitting: Allergy and Immunology

## 2015-06-02 ENCOUNTER — Ambulatory Visit (INDEPENDENT_AMBULATORY_CARE_PROVIDER_SITE_OTHER): Payer: Medicare Other | Admitting: Allergy and Immunology

## 2015-06-02 VITALS — BP 116/70 | HR 68 | Temp 98.1°F | Resp 20 | Ht 64.17 in | Wt 171.7 lb

## 2015-06-02 DIAGNOSIS — L501 Idiopathic urticaria: Secondary | ICD-10-CM

## 2015-06-02 DIAGNOSIS — R262 Difficulty in walking, not elsewhere classified: Secondary | ICD-10-CM

## 2015-06-02 DIAGNOSIS — J310 Chronic rhinitis: Secondary | ICD-10-CM | POA: Diagnosis not present

## 2015-06-02 DIAGNOSIS — M25571 Pain in right ankle and joints of right foot: Secondary | ICD-10-CM

## 2015-06-02 DIAGNOSIS — J3089 Other allergic rhinitis: Secondary | ICD-10-CM | POA: Insufficient documentation

## 2015-06-02 DIAGNOSIS — L500 Allergic urticaria: Secondary | ICD-10-CM | POA: Insufficient documentation

## 2015-06-02 NOTE — Therapy (Signed)
[facility] [address]  [place], [place], [address] Phone: [phone]   Fax:  [phone]  Physical Therapy Treatment  Patient Details  Name: Vanessa Oliver MRN: [id number] Date of Birth: Jun 21, 1951 Referring Provider: Lloyd Huger, MD  Encounter Date: 06/02/2015      PT End of Session - 06/02/15 1407    Visit Number 5   Number of Visits 12   Date for PT Re-Evaluation 06/26/15   PT Start Time 1400   PT Stop Time 1447   PT Time Calculation (min) 47 min   Activity Tolerance Patient tolerated treatment well   Behavior During Therapy [facility] for tasks assessed/performed      Past Medical History  Diagnosis Date  . Arthritis   . Hyperlipidemia   . Hiatal hernia   . Fibromyalgia   . IBS (irritable bowel syndrome)   . Esophageal stricture   . Plantar fasciitis   . Rosacea   . TMJ (temporomandibular joint disorder)   . Vasovagal syncope   . Allergy     takes Singulair at night  . Fibromyalgia   . Depression     takes CYmbalta daily  . GERD (gastroesophageal reflux disease)     takes Omeprazole daily  . Family history of adverse reaction to anesthesia     oldest brother had trouble with anesthesia a long time ago but can't recall what  . History of bronchitis   . Vasovagal episode back in the 80's  . Weakness     right leg  . Joint pain   . Chronic back pain     stenosis  . Joint swelling   . Hemorrhoids     Past Surgical History  Procedure Laterality Date  . Nissen fundoplication  0000000  . Retinal laser procedure Right 02/01/2011    right eye   . Nasal septum surgery  1970  . Knee arthroscopy Right 1994    x 3  . 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 Carpal tunnel release Bilateral 2004  . Trigger finger release Bilateral 2005  . Abdominal hysterectomy  1997  . Varicose vein surgery Bilateral 2007, 2009    left 2007, right 2009  . Mallet finger Right 2011  . Vitrectomy  Right 2012  . Cataract extraction Right 2013    Dr Ellison Hughs  . Carpometacarpal (cmc) fusion of thumb  2015    thumb  . Tonsillectomy  1981  . Nissen fundoplication    . Colonoscopy    . Colonoscopy with esophagogastroduodenoscopy (egd) and esophageal dilation (ed)    . Eye surgery      scar tissue remove  . Lumbar laminectomy/decompression microdiscectomy Right 02/11/2015    Procedure: Laminectomy and Foraminotomy - Right - L3-L4;  Surgeon: Earnie Larsson, MD;  Location: [facility];  Service: Neurosurgery;  Laterality: Right;  Laminectomy and Foraminotomy - Right - L3-L4    There were no vitals filed for this visit.  Visit Diagnosis:  Pain in joint, ankle and foot, right  Difficulty walking      Subjective Assessment - 06/02/15 1404    Subjective Patient continues to report pain worst at night and when getting moving in the morning, but typically feels better as the day progresses.   Currently in Pain? Yes   Pain Score 3    Pain Location Foot  Arch   Pain Orientation Right;Medial  [facility] Adult PT Treatment/Exercise - 06/02/15 1400    Exercises   Exercises Ankle   Modalities   Modalities Ultrasound   Ultrasound   Ultrasound Location R medial ankle/arch/posterior tib   Ultrasound Parameters 3.3MHz, 50%, 0.5 W/cm2 x8'   Ultrasound Goals Pain   Manual Therapy   Manual Therapy Soft tissue mobilization   Soft tissue mobilization cross friction R posterior tibialis and distal to proximal; PROM   Ankle Exercises: Stretches   Other Stretch Rt ankle rocker stretch 3x20"   Ankle Exercises: Standing   SLS 3x15" with 1-2 pole A, standing on blue foam   Heel Raises 10 reps;3 seconds   Heel Raises Limitations at back of UBE; DL raise, increased emphasis on Rt with eccentric lowering   Side Shuffle (Round Trip) with yellow TB around feet 39ft x 2, bilateral   Other Standing Ankle Exercises DL & Right SLS eccntric lean nose to wall x10 each   Other Standing Ankle  Exercises Standing on blue - lateral and ant/post wt shifting x10, marching in place x10   Ankle Exercises: Seated   BAPS Sitting;Level 3;15 reps   BAPS Limitations R/L, F/B, and circles CW/CCW                  PT Short Term Goals - 04/21/15 0906    PT SHORT TERM GOAL #1   Title pt independent with initial HEP by 03/28/15   Status Achieved           PT Long Term Goals - 05/27/15 1421    PT LONG TERM GOAL #1   Title Patient will be independent with advanced HEP as necessary (06/26/15)   Status On-going   PT LONG TERM GOAL #2   Title Patient will demonstrate right ankle strength 4/5 or greater without pain for improved ankle stability (06/26/15)   Status On-going   PT LONG TERM GOAL #3   Title Patient will be able to walk for 20 minutes without increased pain (06/26/15)   Status On-going   PT LONG TERM GOAL #4   Title Patient able to return to performing chores, exercise, and recreational activities without limitation by foot/ankle pain (06/26/15)   Status On-going               Plan - 06/02/15 1452    Clinical Impression Statement Introduced stability exercises with standing on blue foam incorporating weight shift and single limb stance and increased emphasis on eccentric muscle activation with good patient tolerance.    PT Next Visit Plan Right ankle ROM and strengthening as pain allows; Manual therapy, especially cross friction massage; Modalities PRN, Taping as adjunct or alterantive to aircast?   Consulted and Agree with Plan of Care Patient        Problem List Patient Active Problem List   Diagnosis Date Noted  . Idiopathic urticaria 06/02/2015  . Chronic rhinitis 06/02/2015  . Sinusitis, acute 04/11/2015  . Urticarial rash 03/14/2015  . 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 Lumbar stenosis 02/11/2015  . Abdominal pain, chronic, right lower quadrant 01/07/2015  . Right hip pain 10/31/2014  . Vitamin D  deficiency 10/31/2014  . Osteopenia 10/18/2014  . OSA (obstructive sleep apnea) 07/16/2014  . Migraines 07/16/2014  . TMJ (dislocation of temporomandibular joint) 07/16/2014  . HLD (hyperlipidemia) 03/22/2014  . S/P Nissen fundoplication May 0000000 0000000  . Dysphagia 01/24/2008  . DEPRESSION/ANXIETY 12/02/2007  . ESOPHAGEAL STRICTURE 12/02/2007  . GERD 12/02/2007  . Irritable bowel  syndrome 12/02/2007  . Fibromyalgia 12/02/2007  . VASOVAGAL SYNCOPE 12/02/2007    Percival Spanish, PT, MPT 06/02/2015, 2:58 PM  [facility] [address]  [place], [place], [address] Phone: [phone]   Fax:  [phone]  Name: Vanessa Oliver MRN: [id number] Date of Birth: 1951-06-10

## 2015-06-02 NOTE — Assessment & Plan Note (Signed)
   Continue fluticasone nasal spray and nasal saline lavage as needed.

## 2015-06-02 NOTE — Assessment & Plan Note (Signed)
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  Continue H1/H2 receptor blockade, titrating to the lowest effective dose necessary to suppress urticaria.

## 2015-06-02 NOTE — Progress Notes (Signed)
History of present illness: HPI Comments: Vanessa Oliver is a 64 y.o. female with chronic urticaria and chronic rhinitis who presents today for follow up. She reports that her urticaria improved significantly after September 28. The urticaria seems to flare with increased emotional stress and environmental heat and/or sweating.  She has been under a great deal of stress recently.  Cetirizine daily tends to suppress urticaria and if symptoms arise, and additional cetirizine controls symptoms.  Urticaria lab results are negative/within normal limits. Other than occasional nasal congestion, she has no nasal symptom complaints today.  She reports that fluticasone nasal spray and nasal saline lavage provide benefit when symptoms occur.   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  Continue H1/H2 receptor blockade, titrating to the lowest effective dose necessary to suppress urticaria.   Chronic rhinitis  Continue fluticasone nasal spray and nasal saline lavage as needed.  Diagnositics: All urticaria lab results negative/within normal limits.     Physical examination: Blood pressure 116/70, pulse 68, temperature 98.1 F (36.7 C), temperature source Oral, resp. rate 20, height 5' 4.17" (1.63 m), weight 171 lb 11.8 oz (77.9 kg), last menstrual period 05/14/1996.  General: Alert, interactive, in no acute distress. HEENT: TMs pearly gray, turbinates mildly edematous without discharge, post-pharynx non erythematous. Neck: Supple without lymphadenopathy. Lungs: Clear to auscultation without wheezing, rhonchi or rales. CV: Normal S1, S2 without murmurs. Skin: Warm and dry, without lesions or rashes.  The following portions of the patient's history were reviewed and updated as appropriate: allergies, current medications, past family history, past medical history, past social history, past surgical history and problem  list.  Outpatient medications:   Medication List       This list is accurate as of: 06/02/15 12:35 PM.  Always use your most recent med list.               aspirin 81 MG tablet  Take 81 mg by mouth daily.     benzonatate 100 MG capsule  Commonly known as:  TESSALON PERLES  Take 2 capsules (200 mg total) by mouth 2 (two) times daily as needed for cough.     benzoyl peroxide 5 % gel  Apply 1 application topically daily.     Calcium Carbonate-Vitamin D 600-400 MG-UNIT tablet  Commonly known as:  CALTRATE 600+D  Take 1 tablet by mouth 2 (two) times daily.     cetirizine 10 MG tablet  Commonly known as:  ZYRTEC  Take 10 mg by mouth daily.     cholecalciferol 1000 UNITS tablet  Commonly known as:  VITAMIN D  Take 1,000 Units by mouth daily.     doxycycline 100 MG tablet  Commonly known as:  VIBRA-TABS  Take 1 tablet (100 mg total) by mouth 2 (two) times daily.     DULoxetine 60 MG capsule  Commonly known as:  CYMBALTA  Take 1 capsule (60 mg total) by mouth daily.     FIBER PO  Take 2 capsules by mouth 2 (two) times daily.     FINACEA 15 % cream  Generic drug:  Azelaic Acid  Apply topically 1 day or 1 dose. After skin is thoroughly washed and patted dry, gently but thoroughly massage a thin film of azelaic acid cream into the affected area twice daily, in the morning and evening.     fluticasone 50 MCG/ACT nasal spray  Commonly known as:  FLONASE  Place 2 sprays into both nostrils daily.  montelukast 10 MG tablet  Commonly known as:  SINGULAIR  Take 10 mg by mouth at bedtime.     multivitamin tablet  Take 1 tablet by mouth daily.     omeprazole 40 MG capsule  Commonly known as:  PRILOSEC  Take 1 capsule (40 mg total) by mouth 2 (two) times daily.     Pitavastatin Calcium 2 MG Tabs  Commonly known as:  LIVALO  Take 0.5 tablets (1 mg total) by mouth daily.     PROBIOTIC DAILY PO  Take 1 tablet by mouth daily.     vitamin C 500 MG tablet  Commonly  known as:  ASCORBIC ACID  Take 500 mg by mouth daily.     VOLTAREN 1 % Gel  Generic drug:  diclofenac sodium  APPLY 2 GRAMS TOPICALLY TWICE A DAY AS NEEDED        Known medication allergies: Allergies  Allergen Reactions  . Cefuroxime Axetil     Hives / "throat swelling"  . Chlorzoxazone Rash and Other (See Comments)    "breathing problems"   . Oxycodone     Hallucinations, rash  . Penicillins Anaphylaxis  . Adhesive [Tape]     rash  . Cataflam [Diclofenac Potassium]     Lip swelling  . Clarithromycin Diarrhea  . Flagyl [Metronidazole Hcl]     ?diarrhea  . Glucosamine Hives  . Guaifenesin & Derivatives     Stomach upset  . Hydrocodone-Acetaminophen     REACTION: rash, tightening of throat  . Ketorolac Tromethamine     ?reaction  . Pentazocine     Involuntary twitching  . Pneumovax [Pneumococcal Polysaccharide Vaccine] Other (See Comments)    Redness/swelling at site, nausea  . Smz-Tmp Ds [Sulfamethoxazole W/Trimethoprim (Co-Trimoxazole)]     ?unknown reaction  . Tramadol     constipation  . Trazodone And Nefazodone Other (See Comments)    Scratchy throat / congestion    I appreciate the opportunity to take part in this Anneka's care. Please do not hesitate to contact me with questions.  Sincerely,   R. Edgar Frisk, MD

## 2015-06-02 NOTE — Patient Instructions (Signed)
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  Continue H1/H2 receptor blockade, titrating to the lowest effective dose necessary to suppress urticaria.   Chronic rhinitis  Continue fluticasone nasal spray and nasal saline lavage as needed.   Return in about 1 year (around 06/01/2016), or if symptoms worsen or fail to improve.

## 2015-06-03 ENCOUNTER — Encounter: Payer: Self-pay | Admitting: *Deleted

## 2015-06-04 ENCOUNTER — Ambulatory Visit: Payer: Medicare Other | Admitting: Physical Therapy

## 2015-06-04 DIAGNOSIS — R262 Difficulty in walking, not elsewhere classified: Secondary | ICD-10-CM

## 2015-06-04 DIAGNOSIS — M25571 Pain in right ankle and joints of right foot: Secondary | ICD-10-CM

## 2015-06-04 NOTE — Therapy (Signed)
[facility] [address]  [place], [place], [address] Phone: [phone]   Fax:  [phone]  Physical Therapy Treatment  Patient Details  Name: Vanessa Oliver MRN: [id number] Date of Birth: June 22, 1951 Referring Provider: Lloyd Huger, MD  Encounter Date: 06/04/2015      PT End of Session - 06/04/15 1110    Visit Number 6   Number of Visits 12   Date for PT Re-Evaluation 06/26/15   PT Start Time 1059   PT Stop Time 1155   PT Time Calculation (min) 56 min      Past Medical History  Diagnosis Date  . Arthritis   . Hyperlipidemia   . Hiatal hernia   . Fibromyalgia   . IBS (irritable bowel syndrome)   . Esophageal stricture   . Plantar fasciitis   . Rosacea   . TMJ (temporomandibular joint disorder)   . Vasovagal syncope   . Allergy     takes Singulair at night  . Fibromyalgia   . Depression     takes CYmbalta daily  . GERD (gastroesophageal reflux disease)     takes Omeprazole daily  . Family history of adverse reaction to anesthesia     oldest brother had trouble with anesthesia a long time ago but can't recall what  . History of bronchitis   . Vasovagal episode back in the 80's  . Weakness     right leg  . Joint pain   . Chronic back pain     stenosis  . Joint swelling   . Hemorrhoids     Past Surgical History  Procedure Laterality Date  . Nissen fundoplication  0000000  . Retinal laser procedure Right 02/01/2011    right eye   . Nasal septum surgery  1970  . Knee arthroscopy Right 1994    x 3  . 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 Carpal tunnel release Bilateral 2004  . Trigger finger release Bilateral 2005  . Abdominal hysterectomy  1997  . Varicose vein surgery Bilateral 2007, 2009    left 2007, right 2009  . Mallet finger Right 2011  . Vitrectomy Right 2012  . Cataract extraction Right 2013    Dr Ellison Hughs  . Carpometacarpal (cmc) fusion of thumb  2015    thumb   . Tonsillectomy  1981  . Nissen fundoplication    . Colonoscopy    . Colonoscopy with esophagogastroduodenoscopy (egd) and esophageal dilation (ed)    . Eye surgery      scar tissue remove  . Lumbar laminectomy/decompression microdiscectomy Right 02/11/2015    Procedure: Laminectomy and Foraminotomy - Right - L3-L4;  Surgeon: Earnie Larsson, MD;  Location: [facility];  Service: Neurosurgery;  Laterality: Right;  Laminectomy and Foraminotomy - Right - L3-L4    There were no vitals filed for this visit.  Visit Diagnosis:  Pain in joint, ankle and foot, right  Difficulty walking      Subjective Assessment - 06/04/15 1102    Subjective states notes pain immediately upon waking in AM before putting foot on ground then persists after up on feet and gradually decreases over the next couple of hours.   Currently in Pain? Yes   Pain Score --  8/10 upon waking, 4-5/10 by mid morning, 2-3/10 by afternoon and evening.  Pain then returns when in bed.   Pain Location Foot   Pain Orientation Right;Medial   Pain  Descriptors / Indicators Burning   Pain Frequency Constant            TODAY'S TREATMENT Manual - STM and retromassage throughout R medial foot and lower leg due to pain to R abductor hallucis and posterior tib tendon along with pitting edema to medial ankle and lower leg.  Kinesiology Tape: 4 strips - 75% medial foot to lateral foot, 50% Achilles, 50% posterior tib, 75% stirrup  TherEx - Seated BAPS R LVL 3:    CW/CCW 10x each    5# at P 15x PF/DF    5# other 4 location with 10x PF/DF each Rocker DF ankle stretch 3x20" Foam Pad SLS with Single Pole A 2x20" Foam Pad Tandem stance 20" each with light single pole A                PT Short Term Goals - 04/21/15 0906    PT SHORT TERM GOAL #1   Title pt independent with initial HEP by 03/28/15   Status Achieved           PT Long Term Goals - 06/04/15 1602    PT LONG TERM GOAL #1   Title Patient will be  independent with advanced HEP as necessary (06/26/15)   Status On-going   PT LONG TERM GOAL #2   Title Patient will demonstrate right ankle strength 4/5 or greater without pain for improved ankle stability (06/26/15)   Status On-going   PT LONG TERM GOAL #3   Title Patient will be able to walk for 20 minutes without increased pain (06/26/15)   Status On-going   PT LONG TERM GOAL #4   Title Patient able to return to performing chores, exercise, and recreational activities without limitation by foot/ankle pain (06/26/15)   Status On-going               Plan - 06/04/15 1603    Clinical Impression Statement trial of kinesiology tape to L foot today due to pitting edema throughout lateral lower leg along with TTP to posterior tib tendon and abductor hallicus muscle.  Continued with BAPS and standing activities with foam pad to work on ankle stability.  Continued tightness noted with DF stretch when L is compared to R.   PT Next Visit Plan Right ankle ROM and strengthening as pain allows; Manual therapy, especially cross friction massage; Modalities PRN, Taping PRN if benefit noted   Consulted and Agree with Plan of Care Patient        Problem List Patient Active Problem List   Diagnosis Date Noted  . Idiopathic urticaria 06/02/2015  . Chronic rhinitis 06/02/2015  . Sinusitis, acute 04/11/2015  . Urticarial rash 03/14/2015  . 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 Lumbar stenosis 02/11/2015  . Abdominal pain, chronic, right lower quadrant 01/07/2015  . Right hip pain 10/31/2014  . Vitamin D deficiency 10/31/2014  . Osteopenia 10/18/2014  . OSA (obstructive sleep apnea) 07/16/2014  . Migraines 07/16/2014  . TMJ (dislocation of temporomandibular joint) 07/16/2014  . HLD (hyperlipidemia) 03/22/2014  . S/P Nissen fundoplication May 0000000 0000000  . Dysphagia 01/24/2008  . DEPRESSION/ANXIETY 12/02/2007  . ESOPHAGEAL STRICTURE 12/02/2007   . GERD 12/02/2007  . Irritable bowel syndrome 12/02/2007  . Fibromyalgia 12/02/2007  . VASOVAGAL SYNCOPE 12/02/2007    Nori Poland PT, OCS 06/04/2015, 4:07 PM  [facility] [address]  [place], [place], [address] Phone: [phone]   Fax:  [phone]  Name: Vanessa Oliver MRN: [id number] Date of Birth: 09-04-1950

## 2015-06-09 ENCOUNTER — Ambulatory Visit (INDEPENDENT_AMBULATORY_CARE_PROVIDER_SITE_OTHER): Payer: Medicare Other | Admitting: Family

## 2015-06-09 ENCOUNTER — Ambulatory Visit (HOSPITAL_BASED_OUTPATIENT_CLINIC_OR_DEPARTMENT_OTHER)
Admission: RE | Admit: 2015-06-09 | Discharge: 2015-06-09 | Disposition: A | Discharge status: Home / Self Care | Payer: Medicare Other | Source: Ambulatory Visit | Attending: Family | Admitting: Family

## 2015-06-09 ENCOUNTER — Telehealth: Payer: Self-pay | Admitting: *Deleted

## 2015-06-09 ENCOUNTER — Ambulatory Visit: Payer: Medicare Other | Admitting: Physical Therapy

## 2015-06-09 ENCOUNTER — Encounter: Payer: Self-pay | Admitting: Family

## 2015-06-09 VITALS — BP 120/73 | HR 72 | Temp 98.6°F | Resp 16 | Ht 64.0 in | Wt 171.4 lb

## 2015-06-09 DIAGNOSIS — M7989 Other specified soft tissue disorders: Secondary | ICD-10-CM | POA: Insufficient documentation

## 2015-06-09 DIAGNOSIS — M25571 Pain in right ankle and joints of right foot: Secondary | ICD-10-CM | POA: Diagnosis not present

## 2015-06-09 DIAGNOSIS — R262 Difficulty in walking, not elsewhere classified: Secondary | ICD-10-CM | POA: Diagnosis not present

## 2015-06-09 DIAGNOSIS — M25552 Pain in left hip: Secondary | ICD-10-CM

## 2015-06-09 DIAGNOSIS — E785 Hyperlipidemia, unspecified: Secondary | ICD-10-CM

## 2015-06-09 LAB — URIC ACID: Uric Acid, Serum: 4.9 mg/dL (ref 2.4–7.0)

## 2015-06-09 LAB — LIPID PANEL
CHOLESTEROL: 172 mg/dL (ref 0–200)
HDL: 36.5 mg/dL — AB (ref >39.00)
LDL Cholesterol: 108 mg/dL — ABNORMAL HIGH (ref 0–99)
NonHDL: 135.07
TRIGLYCERIDES: 136 mg/dL (ref 0.0–149.0)
Total CHOL/HDL Ratio: 5
VLDL: 27.2 mg/dL (ref 0.0–40.0)

## 2015-06-09 MED ORDER — GABAPENTIN 100 MG PO CAPS: 100.0000 mg | ORAL_CAPSULE | Freq: Three times a day (TID) | ORAL | Status: DC

## 2015-06-09 NOTE — Progress Notes (Signed)
Subjective:    Patient ID: Vanessa Oliver, female    DOB: 26-Sep-1950, 64 y.o.   MRN: [id number]  HPI  Vanessa Oliver is a 64 yr old female who presents today with several concerns:  1) Fall- pt reports fall on 06/02/15.  She reports that she tripped in a parking garage and scraped her elbows and knees. R knee is sore, L elbow is sore.  No head trauma.   2) Foot pain- pt is currently following with podiatry Vanessa Oliver) who is treating her for tendonitis of the right foot.  She is undergoing PT.  However she continues to having burning pain in the right foot RLE swelling. She reports that pain is ot improving.    3) Hyperlipidemia- Pt is fasting today. Reports that she is watching her diet.   Lab Results  Component Value Date   CHOL 171 01/07/2015   HDL 41.30 01/07/2015   LDLCALC 96 01/07/2015   TRIG 169.0* 01/07/2015   CHOLHDL 4 01/07/2015      Review of Systems See HPI  Past Medical History  Diagnosis Date  . Arthritis   . Hyperlipidemia   . Hiatal hernia   . Fibromyalgia   . IBS (irritable bowel syndrome)   . Esophageal stricture   . Plantar fasciitis   . Rosacea   . TMJ (temporomandibular joint disorder)   . Vasovagal syncope   . Allergy     takes Singulair at night  . Fibromyalgia   . Depression     takes CYmbalta daily  . GERD (gastroesophageal reflux disease)     takes Omeprazole daily  . Family history of adverse reaction to anesthesia     oldest brother had trouble with anesthesia a long time ago but can't recall what  . History of bronchitis   . Vasovagal episode back in the 80's  . Weakness     right leg  . Joint pain   . Chronic back pain     stenosis  . Joint swelling   . Hemorrhoids     Social History   Social History  . Marital Status: Married    Spouse Name: N/A  . Number of Children: 0  . Years of Education: N/A   Occupational History  . clerical    Social History Main Topics  . Smoking status: Never Smoker   . Smokeless tobacco:  Never Used  . Alcohol Use: 0.0 oz/week    0 Standard drinks or equivalent per week     Comment: rarely  . Drug Use: No  . Sexual Activity: Yes    Birth Control/ Protection: Surgical   Other Topics Concern  . Not on file   Social History Narrative   Works as an Glass blower/designer   Married- husband is 62 years older than her   Former Dance movement psychotherapist up up [employer]   Has cats   Enjoys reading   No children    Past Surgical History  Procedure Laterality Date  . Nissen fundoplication  0000000  . Retinal laser procedure Right 02/01/2011    right eye   . Nasal septum surgery  1970  . Knee arthroscopy Right 1994    x 3  . 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 Carpal tunnel release Bilateral 2004  . Trigger finger release Bilateral 2005  . Abdominal hysterectomy  1997  . Varicose vein surgery Bilateral 2007, 2009    left 2007, right 2009  .  Mallet finger Right 2011  . Vitrectomy Right 2012  . Cataract extraction Right 2013    Dr Ellison Hughs  . Carpometacarpal (cmc) fusion of thumb  2015    thumb  . Tonsillectomy  1981  . Nissen fundoplication    . Colonoscopy    . Colonoscopy with esophagogastroduodenoscopy (egd) and esophageal dilation (ed)    . Eye surgery      scar tissue remove  . Lumbar laminectomy/decompression microdiscectomy Right 02/11/2015    Procedure: Laminectomy and Foraminotomy - Right - L3-L4;  Surgeon: Earnie Larsson, MD;  Location: [facility];  Service: Neurosurgery;  Laterality: Right;  Laminectomy and Foraminotomy - Right - L3-L4    Family History  Problem Relation Age of Onset  . Breast cancer Mother   . Arthritis Mother   . Hyperlipidemia Mother   . Hypertension Mother   . Lung cancer Father   . Pancreatic cancer Father   . Skin cancer Father   . Arthritis Father   . Celiac disease Brother   . Hyperlipidemia Maternal Grandfather   . Heart disease Maternal Grandfather   . Stroke Maternal Grandfather     Allergies  Allergen Reactions  .  Cefuroxime Axetil     Hives / "throat swelling"  . Chlorzoxazone Rash and Other (See Comments)    "breathing problems"   . Oxycodone     Hallucinations, rash  . Penicillins Anaphylaxis  . Adhesive [Tape]     rash  . Cataflam [Diclofenac Potassium]     Lip swelling  . Clarithromycin Diarrhea  . Flagyl [Metronidazole Hcl]     ?diarrhea  . Glucosamine Hives  . Guaifenesin & Derivatives     Stomach upset  . Hydrocodone-Acetaminophen     REACTION: rash, tightening of throat  . Ketorolac Tromethamine     ?reaction  . Pentazocine     Involuntary twitching  . Pneumovax [Pneumococcal Polysaccharide Vaccine] Other (See Comments)    Redness/swelling at site, nausea  . Smz-Tmp Ds [Sulfamethoxazole W/Trimethoprim (Co-Trimoxazole)]     ?unknown reaction  . Tramadol     constipation  . Trazodone And Nefazodone Other (See Comments)    Scratchy throat / congestion    Current Outpatient Prescriptions on File Prior to Visit  Medication Sig Dispense Refill  . aspirin 81 MG tablet Take 81 mg by mouth daily.    . Azelaic Acid (FINACEA) 15 % cream Apply topically 1 day or 1 dose. After skin is thoroughly washed and patted dry, gently but thoroughly massage a thin film of azelaic acid cream into the affected area twice daily, in the morning and evening.     . benzoyl peroxide 5 % gel Apply 1 application topically daily.     . Calcium Carbonate-Vitamin D (CALTRATE 600+D) 600-400 MG-UNIT per tablet Take 1 tablet by mouth 2 (two) times daily. (Patient taking differently: Take 2 tablets by mouth 2 (two) times daily. )    . cetirizine (ZYRTEC) 10 MG tablet Take 10 mg by mouth daily.    . cholecalciferol (VITAMIN D) 1000 UNITS tablet Take 1,000 Units by mouth daily.     . DULoxetine (CYMBALTA) 60 MG capsule Take 1 capsule (60 mg total) by mouth daily. 90 capsule 1  . FIBER PO Take 2 capsules by mouth 2 (two) times daily.    . fluticasone (FLONASE) 50 MCG/ACT nasal spray Place 2 sprays into both nostrils  daily.    . montelukast (SINGULAIR) 10 MG tablet Take 10 mg by mouth at bedtime.    Marland Kitchen  Multiple Vitamin (MULTIVITAMIN) tablet Take 1 tablet by mouth daily.    Marland Kitchen omeprazole (PRILOSEC) 40 MG capsule Take 1 capsule (40 mg total) by mouth 2 (two) times daily. 180 capsule 1  . Pitavastatin Calcium (LIVALO) 2 MG TABS Take 0.5 tablets (1 mg total) by mouth daily. (Patient taking differently: Take 1 mg by mouth daily. Physician ordering 2mg  tablet then patient cutting in half.) 45 tablet 1  . Probiotic Product (PROBIOTIC DAILY PO) Take 1 tablet by mouth daily.    . vitamin C (ASCORBIC ACID) 500 MG tablet Take 500 mg by mouth daily.    . VOLTAREN 1 % GEL APPLY 2 GRAMS TOPICALLY TWICE A DAY AS NEEDED 300 g 1   No current facility-administered medications on file prior to visit.    BP 120/73 mmHg  Pulse 72  Temp(Src) 98.6 F (37 C) (Oral)  Resp 16  Ht 5\' 4"  (1.626 m)  Wt 171 lb 6.4 oz (77.747 kg)  BMI 29.41 kg/m2  SpO2 100%  LMP 05/14/1996       Objective:   Physical Exam  Constitutional: She appears well-developed and well-nourished.  Cardiovascular: Normal rate, regular rhythm and normal heart sounds.   No murmur heard. Pulmonary/Chest: Effort normal and breath sounds normal. No respiratory distress. She has no wheezes.  Musculoskeletal:  2+ RLE swelling, 1-2 + LLE swelling  Psychiatric: She has a normal mood and affect. Her behavior is normal. Judgment and thought content normal.          Assessment & Plan:  R foot pain- will obtain uric acid level to evaluate for gout.  Bilateral Leg swelling- obtain bilateral LE doppler to rule out dvt.   L hip pain- following fall- will obtain x ray to rule out fracutre

## 2015-06-09 NOTE — Therapy (Signed)
[facility] [address]  [place], [place], [address] Phone: [phone]   Fax:  [phone]  Physical Therapy Treatment  Patient Details  Name: Vanessa Oliver MRN: [id number] Date of Birth: 02/11/1951 Referring Provider: Lloyd Huger, MD  Encounter Date: 06/09/2015      PT End of Session - 06/09/15 1329    Visit Number 7   Number of Visits 12   Date for PT Re-Evaluation 06/26/15      Past Medical History  Diagnosis Date  . Arthritis   . Hyperlipidemia   . Hiatal hernia   . Fibromyalgia   . IBS (irritable bowel syndrome)   . Esophageal stricture   . Plantar fasciitis   . Rosacea   . TMJ (temporomandibular joint disorder)   . Vasovagal syncope   . Allergy     takes Singulair at night  . Fibromyalgia   . Depression     takes CYmbalta daily  . GERD (gastroesophageal reflux disease)     takes Omeprazole daily  . Family history of adverse reaction to anesthesia     oldest brother had trouble with anesthesia a long time ago but can't recall what  . History of bronchitis   . Vasovagal episode back in the 80's  . Weakness     right leg  . Joint pain   . Chronic back pain     stenosis  . Joint swelling   . Hemorrhoids     Past Surgical History  Procedure Laterality Date  . Nissen fundoplication  0000000  . Retinal laser procedure Right 02/01/2011    right eye   . Nasal septum surgery  1970  . Knee arthroscopy Right 1994    x 3  . 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 Carpal tunnel release Bilateral 2004  . Trigger finger release Bilateral 2005  . Abdominal hysterectomy  1997  . Varicose vein surgery Bilateral 2007, 2009    left 2007, right 2009  . Mallet finger Right 2011  . Vitrectomy Right 2012  . Cataract extraction Right 2013    Dr Ellison Hughs  . Carpometacarpal (cmc) fusion of thumb  2015    thumb  . Tonsillectomy  1981  . Nissen fundoplication    . Colonoscopy    .  Colonoscopy with esophagogastroduodenoscopy (egd) and esophageal dilation (ed)    . Eye surgery      scar tissue remove  . Lumbar laminectomy/decompression microdiscectomy Right 02/11/2015    Procedure: Laminectomy and Foraminotomy - Right - L3-L4;  Surgeon: Earnie Larsson, MD;  Location: [facility];  Service: Neurosurgery;  Laterality: Right;  Laminectomy and Foraminotomy - Right - L3-L4    There were no vitals filed for this visit.  Visit Diagnosis:  No diagnosis found.      Subjective Assessment - 06/09/15 1326    Subjective Now wearing compression stocking due to continued edema with swelling somewhat less than last visit. Feels taping helped lat time with night pain, but taping only lasted 3 days.   Currently in Pain? Yes   Pain Score 4    Pain Location Foot   Pain Orientation Right;Medial           TODAY'S TREATMENT  Manual  STM and retromassage throughout R medial foot and lower leg due to pain to R abductor hallucis, anterior tib and posterior tib tendon along with pitting edema to medial ankle and lower leg.  Manual stretch to posterior tibialis tendon  Kinesiology Tape: 4 strips - 75% medial foot to lateral foot, 50% Achilles, 50% posterior tib, 75% stirrup  TherEx Seated BAPS R LVL 3:  CW/CCW 10x each  5# at all 5 locations PF/DF x15 each Rocker DF Rt ankle stretch 3x20" Foam Pad Rt SLS with Single Pole A 3x20" Foam Pad Tandem stance 30" each with light single pole A Bil heel raise with R>L eccentric lowering at back of UBE x10            PT Short Term Goals - 04/21/15 0906    PT SHORT TERM GOAL #1   Title pt independent with initial HEP by 03/28/15   Status Achieved           PT Long Term Goals - 06/04/15 1602    PT LONG TERM GOAL #1   Title Patient will be independent with advanced HEP as necessary (06/26/15)   Status On-going   PT LONG TERM GOAL #2   Title Patient will demonstrate right ankle strength 4/5 or greater without pain for  improved ankle stability (06/26/15)   Status On-going   PT LONG TERM GOAL #3   Title Patient will be able to walk for 20 minutes without increased pain (06/26/15)   Status On-going   PT LONG TERM GOAL #4   Title Patient able to return to performing chores, exercise, and recreational activities without limitation by foot/ankle pain (06/26/15)   Status On-going               Problem List Patient Active Problem List   Diagnosis Date Noted  . Idiopathic urticaria 06/02/2015  . Chronic rhinitis 06/02/2015  . Urticarial rash 03/14/2015  . 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 Lumbar stenosis 02/11/2015  . Abdominal pain, chronic, right lower quadrant 01/07/2015  . Right hip pain 10/31/2014  . Vitamin D deficiency 10/31/2014  . Osteopenia 10/18/2014  . OSA (obstructive sleep apnea) 07/16/2014  . Migraines 07/16/2014  . TMJ (dislocation of temporomandibular joint) 07/16/2014  . HLD (hyperlipidemia) 03/22/2014  . S/P Nissen fundoplication May 0000000 0000000  . Dysphagia 01/24/2008  . DEPRESSION/ANXIETY 12/02/2007  . ESOPHAGEAL STRICTURE 12/02/2007  . GERD 12/02/2007  . Irritable bowel syndrome 12/02/2007  . Fibromyalgia 12/02/2007  . VASOVAGAL SYNCOPE 12/02/2007    Percival Spanish, PT, MPT 06/09/2015, 1:29 PM  [facility] [address]  [place], [place], [address] Phone: [phone]   Fax:  [phone]  Name: Vanessa Oliver MRN: [id number] Date of Birth: [birth year]

## 2015-06-09 NOTE — Progress Notes (Signed)
Pre visit review using our clinic review tool, if applicable. No additional management support is needed unless otherwise documented below in the visit note. 

## 2015-06-09 NOTE — Telephone Encounter (Signed)
Received call from [facility] in Radiology with normal doppler report. States pt has appt for PT next and wants to know if it is ok for pt to proceed. Notified PCP and gave verbal ok to proceed with PT per PCP.

## 2015-06-09 NOTE — Patient Instructions (Signed)
Please complete lab work prior to leaving. Complete ultrasound and hip x ray on the first floor. Start gabapentin for foot pain. Call if symptoms worsen or if symptoms do not improve.

## 2015-06-09 NOTE — Assessment & Plan Note (Signed)
Repeat FLP today.

## 2015-06-10 ENCOUNTER — Encounter: Payer: Self-pay | Admitting: Family

## 2015-06-12 ENCOUNTER — Ambulatory Visit: Payer: Medicare Other | Attending: Orthopedic Surgery | Admitting: Physical Therapy

## 2015-06-12 DIAGNOSIS — M25571 Pain in right ankle and joints of right foot: Secondary | ICD-10-CM

## 2015-06-12 DIAGNOSIS — M545 Low back pain: Secondary | ICD-10-CM | POA: Diagnosis not present

## 2015-06-12 DIAGNOSIS — R262 Difficulty in walking, not elsewhere classified: Secondary | ICD-10-CM | POA: Insufficient documentation

## 2015-06-12 NOTE — Therapy (Signed)
[facility] [address]  [place], [place], [address] Phone: [phone]   Fax:  [phone]  Physical Therapy Treatment  Patient Details  Name: Vanessa Oliver MRN: [id number] Date of Birth: Aug 07, 1950 Referring Provider: Lloyd Huger, MD  Encounter Date: 06/12/2015      PT End of Session - 06/12/15 1326    Visit Number 8   Number of Visits 12   Date for PT Re-Evaluation 06/26/15   PT Start Time [id number]   PT Stop Time 1356   PT Time Calculation (min) 41 min   Activity Tolerance Patient tolerated treatment well   Behavior During Therapy [facility] for tasks assessed/performed      Past Medical History  Diagnosis Date  . Arthritis   . Hyperlipidemia   . Hiatal hernia   . Fibromyalgia   . IBS (irritable bowel syndrome)   . Esophageal stricture   . Plantar fasciitis   . Rosacea   . TMJ (temporomandibular joint disorder)   . Vasovagal syncope   . Allergy     takes Singulair at night  . Fibromyalgia   . Depression     takes CYmbalta daily  . GERD (gastroesophageal reflux disease)     takes Omeprazole daily  . Family history of adverse reaction to anesthesia     oldest brother had trouble with anesthesia a long time ago but can't recall what  . History of bronchitis   . Vasovagal episode back in the 80's  . Weakness     right leg  . Joint pain   . Chronic back pain     stenosis  . Joint swelling   . Hemorrhoids     Past Surgical History  Procedure Laterality Date  . Nissen fundoplication  0000000  . Retinal laser procedure Right 02/01/2011    right eye   . Nasal septum surgery  1970  . Knee arthroscopy Right 1994    x 3  . 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 Carpal tunnel release Bilateral 2004  . Trigger finger release Bilateral 2005  . Abdominal hysterectomy  1997  . Varicose vein surgery Bilateral 2007, 2009    left 2007, right 2009  . Mallet finger Right 2011  . Vitrectomy  Right 2012  . Cataract extraction Right 2013    Dr Ellison Hughs  . Carpometacarpal (cmc) fusion of thumb  2015    thumb  . Tonsillectomy  1981  . Nissen fundoplication    . Colonoscopy    . Colonoscopy with esophagogastroduodenoscopy (egd) and esophageal dilation (ed)    . Eye surgery      scar tissue remove  . Lumbar laminectomy/decompression microdiscectomy Right 02/11/2015    Procedure: Laminectomy and Foraminotomy - Right - L3-L4;  Surgeon: Earnie Larsson, MD;  Location: [place] [facility];  Service: Neurosurgery;  Laterality: Right;  Laminectomy and Foraminotomy - Right - L3-L4    There were no vitals filed for this visit.  Visit Diagnosis:  Pain in joint, ankle and foot, right  Difficulty walking      Subjective Assessment - 06/12/15 1320    Subjective Patient reports she has been "experimenting" since last visit - stopped wearing brace and has placed insert in shoe and has changed from high compression to medium compression stocking. Also still wearing tape from last session. Not sure what's helpiing but pain is better today.   Currently in Pain? Yes   Pain Score  1    Pain Location Foot   Pain Orientation Right;Medial           TODAY'S TREATMENT  TherEx Rocker DF Rt ankle stretch 3x20" Foam Pad:   Marching in place x10   Heel/toe raises x10 each   Rt SLS with light single pole A 3x20"   Tandem stance 2x30" each with light single pole A Bil heel raise with R eccentric lowering at back of UBE 2x10 Eccentric nose to wall, bilateral x10, Rt SLS x10 TRX squat x10 Sidestepping with red TB around midfoot 3x10 ft, bilateral Walking backward, large steps with emphasis on slow eccentric heel lowering 3x10 ft Toe Walking 3x10 ft Heel Walking 3x10 ft Foam Balance Beam:   Sidestepping x4, bilateral   Tandem gait fwd x6, backward x3, intermittent HHA on counter           PT Short Term Goals - 04/21/15 0906    PT SHORT TERM GOAL #1   Title pt independent with initial HEP by  03/28/15   Status Achieved           PT Long Term Goals - 06/12/15 1856    PT LONG TERM GOAL #1   Title Patient will be independent with advanced HEP as necessary (06/26/15)   Status On-going   PT LONG TERM GOAL #2   Title Patient will demonstrate right ankle strength 4/5 or greater without pain for improved ankle stability (06/26/15)   Status On-going   PT LONG TERM GOAL #3   Title Patient will be able to walk for 20 minutes without increased pain (06/26/15)   Status On-going   PT LONG TERM GOAL #4   Title Patient able to return to performing chores, exercise, and recreational activities without limitation by foot/ankle pain (06/26/15)   Status On-going               Plan - 06/12/15 1852    Clinical Impression Statement Patient reporting decreased pain today after "experimenting" with switching from Aircast brace to in-shoe orthotic (kinesiotape still in place from last visit) and reducing compression hose to medium pressure stocking. Able to tolerate progression of standing CKC and stability exercises without increased pain but frequent cues necessary for pacing during exercises and activities.   PT Next Visit Plan Right ankle ROM and strengthening as pain allows; Manual therapy PRN; Modalities PRN, Taping PRN   Consulted and Agree with Plan of Care Patient        Problem List Patient Active Problem List   Diagnosis Date Noted  . Idiopathic urticaria 06/02/2015  . Chronic rhinitis 06/02/2015  . Urticarial rash 03/14/2015  . 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 Lumbar stenosis 02/11/2015  . Abdominal pain, chronic, right lower quadrant 01/07/2015  . Right hip pain 10/31/2014  . Vitamin D deficiency 10/31/2014  . Osteopenia 10/18/2014  . OSA (obstructive sleep apnea) 07/16/2014  . Migraines 07/16/2014  . TMJ (dislocation of temporomandibular joint) 07/16/2014  . HLD (hyperlipidemia) 03/22/2014  . S/P Nissen fundoplication  May 0000000 0000000  . Dysphagia 01/24/2008  . DEPRESSION/ANXIETY 12/02/2007  . ESOPHAGEAL STRICTURE 12/02/2007  . GERD 12/02/2007  . Irritable bowel syndrome 12/02/2007  . Fibromyalgia 12/02/2007  . VASOVAGAL SYNCOPE 12/02/2007    Percival Spanish, PT, MPT 06/12/2015, 7:01 PM  [facility] [address]  [address] [place], [place], [address] Phone: [phone]   Fax:  [phone]  Name: Vanessa Oliver MRN:  [id number] Date of Birth: 02/11/1951

## 2015-06-13 DIAGNOSIS — Z6829 Body mass index (BMI) 29.0-29.9, adult: Secondary | ICD-10-CM | POA: Diagnosis not present

## 2015-06-13 DIAGNOSIS — M5416 Radiculopathy, lumbar region: Secondary | ICD-10-CM | POA: Diagnosis not present

## 2015-06-16 ENCOUNTER — Ambulatory Visit: Payer: Medicare Other | Admitting: Physical Therapy

## 2015-06-16 DIAGNOSIS — R262 Difficulty in walking, not elsewhere classified: Secondary | ICD-10-CM | POA: Diagnosis not present

## 2015-06-16 DIAGNOSIS — M545 Low back pain: Secondary | ICD-10-CM | POA: Diagnosis not present

## 2015-06-16 DIAGNOSIS — M25571 Pain in right ankle and joints of right foot: Secondary | ICD-10-CM | POA: Diagnosis not present

## 2015-06-16 NOTE — Therapy (Signed)
[facility] [address]  [place], [place], [address] Phone: [phone]   Fax:  [phone]  Physical Therapy Treatment  Patient Details  Name: Vanessa Oliver MRN: [id number] Date of Birth: [birth year] Referring Provider: Lloyd Huger, MD  Encounter Date: 06/16/2015      PT End of Session - 06/16/15 0807    Visit Number 9   Number of Visits 12   Date for PT Re-Evaluation 06/26/15   PT Start Time 0759   PT Stop Time 0846   PT Time Calculation (min) 47 min   Activity Tolerance Patient tolerated treatment well   Behavior During Therapy [facility] for tasks assessed/performed      Past Medical History  Diagnosis Date  . Arthritis   . Hyperlipidemia   . Hiatal hernia   . Fibromyalgia   . IBS (irritable bowel syndrome)   . Esophageal stricture   . Plantar fasciitis   . Rosacea   . TMJ (temporomandibular joint disorder)   . Vasovagal syncope   . Allergy     takes Singulair at night  . Fibromyalgia   . Depression     takes CYmbalta daily  . GERD (gastroesophageal reflux disease)     takes Omeprazole daily  . Family history of adverse reaction to anesthesia     oldest brother had trouble with anesthesia a long time ago but can't recall what  . History of bronchitis   . Vasovagal episode back in the 80's  . Weakness     right leg  . Joint pain   . Chronic back pain     stenosis  . Joint swelling   . Hemorrhoids     Past Surgical History  Procedure Laterality Date  . Nissen fundoplication  0000000  . Retinal laser procedure Right 02/01/2011    right eye   . Nasal septum surgery  1970  . Knee arthroscopy Right 1994    x 3  . 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 Carpal tunnel release Bilateral 2004  . Trigger finger release Bilateral 2005  . Abdominal hysterectomy  1997  . Varicose vein surgery Bilateral 2007, 2009    left 2007, right 2009  . Mallet finger Right 2011  . Vitrectomy  Right 2012  . Cataract extraction Right 2013    Dr Ellison Hughs  . Carpometacarpal (cmc) fusion of thumb  2015    thumb  . Tonsillectomy  1981  . Nissen fundoplication    . Colonoscopy    . Colonoscopy with esophagogastroduodenoscopy (egd) and esophageal dilation (ed)    . Eye surgery      scar tissue remove  . Lumbar laminectomy/decompression microdiscectomy Right 02/11/2015    Procedure: Laminectomy and Foraminotomy - Right - L3-L4;  Surgeon: Earnie Larsson, MD;  Location: [place] [facility];  Service: Neurosurgery;  Laterality: Right;  Laminectomy and Foraminotomy - Right - L3-L4    There were no vitals filed for this visit.  Visit Diagnosis:  Pain in joint, ankle and foot, right  Difficulty walking      Subjective Assessment - 06/16/15 0801    Subjective Patient reporting she saw Dr. Trenton Gammon who wants to do a nerve conduction study to check for tarsal tunnel syndrome; study schedule for 06/27/15. Patient continues to go without brace and reports no pain in the arch with pain today now more in the ankle. Also reporting great toe is startins to bother her again;  thinks other pain was overriding this pain before.   Currently in Pain? Yes   Pain Score 2    Pain Location Ankle   Pain Orientation Right;Medial   Pain Descriptors / Indicators Aching           TODAY'S TREATMENT  TherEx Nustep lvl 4 x3' Rocker DF Rt ankle stretch 3x30" Foam Pad:  Marching in place x15  Heel/toe raises x15 each  Rt SLS with light single pole A 3x30"  Tandem stance 2x30" each with light single pole A Bil heel raise with R eccentric lowering at back of UBE 2x10 Eccentric nose to wall, bilateral x15, Rt SLS x15 Rt SLS on blue foam oval with vectors (fwd/side/back) 2x5  TRX squat with heel raise x15  Manual  STM and retromassage throughout R medial foot and lower leg due to pain to R abductor hallucis and posterior tib tendon along with pitting edema to medial ankle and lower leg. Kinesiology Tape: 4 strips  - 75% medial foot to lateral foot, 50% Achilles, 50% posterior tib, 75% stirrup           PT Short Term Goals - 04/21/15 0906    PT SHORT TERM GOAL #1   Title pt independent with initial HEP by 03/28/15   Status Achieved           PT Long Term Goals - 06/12/15 1856    PT LONG TERM GOAL #1   Title Patient will be independent with advanced HEP as necessary (06/26/15)   Status On-going   PT LONG TERM GOAL #2   Title Patient will demonstrate right ankle strength 4/5 or greater without pain for improved ankle stability (06/26/15)   Status On-going   PT LONG TERM GOAL #3   Title Patient will be able to walk for 20 minutes without increased pain (06/26/15)   Status On-going   PT LONG TERM GOAL #4   Title Patient able to return to performing chores, exercise, and recreational activities without limitation by foot/ankle pain (06/26/15)   Status On-going               Plan - 06/16/15 1007    Clinical Impression Statement Patient reporting MD thinking her pain and swelling may be the result of tarsal tunnel syndrome and has ordered a nerve conduction study. Patient reporting less pain in arch of foot today but pain still in medial foot anterior and inferior to medial malleolus with increased swelling noted again today now that patient no longer wearing compression stocking. Resumed retrograde massage and taping today in effort to reduce pain and edema and encouraged patient to continue to use compression stocking when swelling present.   PT Next Visit Plan Right ankle ROM and strengthening as pain allows; Manual therapy PRN; Modalities PRN, Taping PRN   Consulted and Agree with Plan of Care Patient        Problem List Patient Active Problem List   Diagnosis Date Noted  . Idiopathic urticaria 06/02/2015  . Chronic rhinitis 06/02/2015  . Urticarial rash 03/14/2015  . 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 Lumbar stenosis 02/11/2015   . Abdominal pain, chronic, right lower quadrant 01/07/2015  . Right hip pain 10/31/2014  . Vitamin D deficiency 10/31/2014  . Osteopenia 10/18/2014  . OSA (obstructive sleep apnea) 07/16/2014  . Migraines 07/16/2014  . TMJ (dislocation of temporomandibular joint) 07/16/2014  . HLD (hyperlipidemia) 03/22/2014  . S/P Nissen fundoplication May 0000000 0000000  . Dysphagia 01/24/2008  .  DEPRESSION/ANXIETY 12/02/2007  . ESOPHAGEAL STRICTURE 12/02/2007  . GERD 12/02/2007  . Irritable bowel syndrome 12/02/2007  . Fibromyalgia 12/02/2007  . VASOVAGAL SYNCOPE 12/02/2007    Percival Spanish, PT, MPT 06/16/2015, 10:13 AM  [facility] [address]  [place], [place], [address] Phone: [phone]   Fax:  [phone]  Name: Vanessa Oliver MRN: [id number] Date of Birth: [birth year]

## 2015-06-19 ENCOUNTER — Encounter: Payer: Medicare Other | Admitting: Physical Therapy

## 2015-06-24 ENCOUNTER — Ambulatory Visit: Payer: Medicare Other | Admitting: Physical Therapy

## 2015-06-24 DIAGNOSIS — M545 Low back pain, unspecified: Secondary | ICD-10-CM

## 2015-06-24 DIAGNOSIS — M25571 Pain in right ankle and joints of right foot: Secondary | ICD-10-CM | POA: Diagnosis not present

## 2015-06-24 DIAGNOSIS — R262 Difficulty in walking, not elsewhere classified: Secondary | ICD-10-CM

## 2015-06-24 NOTE — Therapy (Addendum)
[facility] [address]  [place], [place], [address] Phone: [phone]   Fax:  [phone]  Physical Therapy Treatment  Patient Details  Name: Vanessa Oliver MRN: [id number] Date of Birth: [birth year] Referring Provider: Lloyd Huger, MD  Encounter Date: 06/24/2015      PT End of Session - 06/24/15 1313    Visit Number 10   Number of Visits 12   Date for PT Re-Evaluation 06/26/15   PT Start Time [id number]   PT Stop Time 1400   PT Time Calculation (min) 45 min   Activity Tolerance Patient tolerated treatment well   Behavior During Therapy [facility] for tasks assessed/performed      Past Medical History  Diagnosis Date  . Arthritis   . Hyperlipidemia   . Hiatal hernia   . Fibromyalgia   . IBS (irritable bowel syndrome)   . Esophageal stricture   . Plantar fasciitis   . Rosacea   . TMJ (temporomandibular joint disorder)   . Vasovagal syncope   . Allergy     takes Singulair at night  . Fibromyalgia   . Depression     takes CYmbalta daily  . GERD (gastroesophageal reflux disease)     takes Omeprazole daily  . Family history of adverse reaction to anesthesia     oldest brother had trouble with anesthesia a long time ago but can't recall what  . History of bronchitis   . Vasovagal episode back in the 80's  . Weakness     right leg  . Joint pain   . Chronic back pain     stenosis  . Joint swelling   . Hemorrhoids     Past Surgical History  Procedure Laterality Date  . Nissen fundoplication  0000000  . Retinal laser procedure Right 02/01/2011    right eye   . Nasal septum surgery  1970  . Knee arthroscopy Right 1994    x 3  . 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 Carpal tunnel release Bilateral 2004  . Trigger finger release Bilateral 2005  . Abdominal hysterectomy  1997  . Varicose vein surgery Bilateral 2007, 2009    left 2007, right 2009  . Mallet finger Right 2011  . Vitrectomy  Right 2012  . Cataract extraction Right 2013    Dr Ellison Hughs  . Carpometacarpal (cmc) fusion of thumb  2015    thumb  . Tonsillectomy  1981  . Nissen fundoplication    . Colonoscopy    . Colonoscopy with esophagogastroduodenoscopy (egd) and esophageal dilation (ed)    . Eye surgery      scar tissue remove  . Lumbar laminectomy/decompression microdiscectomy Right 02/11/2015    Procedure: Laminectomy and Foraminotomy - Right - L3-L4;  Surgeon: Earnie Larsson, MD;  Location: [place] [facility];  Service: Neurosurgery;  Laterality: Right;  Laminectomy and Foraminotomy - Right - L3-L4    There were no vitals filed for this visit.  Visit Diagnosis:  Pain in joint, ankle and foot, right  Difficulty walking  Bilateral low back pain without sciatica      Subjective Assessment - 06/24/15 1320    Subjective Patient reports foot feels good but now pain is in the top of the ankle and her toe has started to bother her again.    Currently in Pain? Yes   Pain Score 4    Pain Location Ankle   Pain Orientation Right;Upper  [facility] PT Assessment - 07/06/15 1315    Observation/Other Assessments   Focus on Therapeutic Outcomes (FOTO)  Foot - 64% limitation (36% limitation)           TODAY'S TREATMENT  TherEx Nustep lvl 5 x3' Rocker DF Rt ankle stretch 3x30" Seated BAPS R LVL 3: CW/CCW 10x each 5# at all locations x15 PF/DF Foam Pad:  Marching in place x15  Heel/toe raises x15 each  Rt SLS with light single pole A 3x30"  Tandem stance 2x30" each with light single pole A Rt SLS on blue foam oval with vectors (fwd/side/back) x10   Manual  STM and cross-friction to anterior tibialis tendon  Deferred Kinesiology Tape as patient reported itching and hives after last time applied           PT Short Term Goals - 04/21/15 0906    PT SHORT TERM GOAL #1   Title pt independent with initial HEP by 03/28/15   Status Achieved           PT Long Term Goals - 06/12/15  1856    PT LONG TERM GOAL #1   Title Patient will be independent with advanced HEP as necessary (06/26/15)   Status On-going   PT LONG TERM GOAL #2   Title Patient will demonstrate right ankle strength 4/5 or greater without pain for improved ankle stability (06/26/15)   Status On-going   PT LONG TERM GOAL #3   Title Patient will be able to walk for 20 minutes without increased pain (06/26/15)   Status On-going   PT LONG TERM GOAL #4   Title Patient able to return to performing chores, exercise, and recreational activities without limitation by foot/ankle pain (06/26/15)   Status On-going               Plan - 06-Jul-2015 1356    Clinical Impression Statement Patient no longer having pain in posterior tibialis tendon with pain seeming to have shifted to anterior tibialis tendon. Patient also reporting lateral great toe pain has been increasing again. Patient has previously responded positively to taping but had to remove tape early last time due to itching and hives, therefore deferred further taping. Perrormed STM to anterior tibialis tendon with some relief noted. Continued focus on strengthening and stablity trainin with good patient tolerance.   PT Next Visit Plan Right ankle ROM and strengthening as pain allows; Manual therapy PRN; Modalities PRN   Consulted and Agree with Plan of Care Patient      G-Codes - 07/06/2015 1358    Functional Assessment Tool Used Foot FOTO = 64% (36% limitation)   Functional Limitation Mobility: Walking and moving around   Mobility: Walking and Moving Around Current Status [id number]) At least 20 percent but less than 40 percent impaired, limited or restricted   Mobility: Walking and Moving Around Goal Status [id number])   At least 40 percent but less than 60 percent   impaired, limited or restricted              Problem List Patient Active Problem List   Diagnosis Date Noted  . Idiopathic urticaria 06/02/2015  . Chronic rhinitis  06/02/2015  . Urticarial rash 03/14/2015  . 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 Lumbar stenosis 02/11/2015  . Abdominal pain, chronic, right lower quadrant 01/07/2015  . Right hip pain 10/31/2014  . Vitamin D deficiency 10/31/2014  . Osteopenia 10/18/2014  . OSA (obstructive sleep apnea) 07/16/2014  . Migraines 07/16/2014  .  TMJ (dislocation of temporomandibular joint) 07/16/2014  . HLD (hyperlipidemia) 03/22/2014  . S/P Nissen fundoplication May 0000000 0000000  . Dysphagia 01/24/2008  . DEPRESSION/ANXIETY 12/02/2007  . ESOPHAGEAL STRICTURE 12/02/2007  . GERD 12/02/2007  . Irritable bowel syndrome 12/02/2007  . Fibromyalgia 12/02/2007  . VASOVAGAL SYNCOPE 12/02/2007    Percival Spanish, PT, MPT 06/24/2015, 2:31 PM  [facility] [address]  [place], [place], [address] Phone: [phone]   Fax:  [phone]  Name: KESHONDA LEAPLEY MRN: [id number] Date of Birth: Feb 28, 1951

## 2015-06-26 ENCOUNTER — Ambulatory Visit: Payer: Medicare Other | Admitting: Physical Therapy

## 2015-06-26 DIAGNOSIS — R262 Difficulty in walking, not elsewhere classified: Secondary | ICD-10-CM

## 2015-06-26 DIAGNOSIS — M25571 Pain in right ankle and joints of right foot: Secondary | ICD-10-CM

## 2015-06-26 DIAGNOSIS — M545 Low back pain: Secondary | ICD-10-CM | POA: Diagnosis not present

## 2015-06-26 NOTE — Therapy (Signed)
[facility] [address]  [place], [place], [address] Phone: [phone]   Fax:  [phone]  Physical Therapy Treatment  Patient Details  Name: Vanessa Oliver MRN: [id number] Date of Birth: Nov 16, 1950 Referring Provider: Lloyd Huger, MD  Encounter Date: 06/26/2015      PT End of Session - 06/26/15 1417    Visit Number 11   Number of Visits 12   Date for PT Re-Evaluation 06/26/15   PT Start Time 9563   PT Stop Time 1446   PT Time Calculation (min) 38 min   Activity Tolerance Patient tolerated treatment well   Behavior During Therapy [facility] for tasks assessed/performed      Past Medical History  Diagnosis Date  . Arthritis   . Hyperlipidemia   . Hiatal hernia   . Fibromyalgia   . IBS (irritable bowel syndrome)   . Esophageal stricture   . Plantar fasciitis   . Rosacea   . TMJ (temporomandibular joint disorder)   . Vasovagal syncope   . Allergy     takes Singulair at night  . Fibromyalgia   . Depression     takes CYmbalta daily  . GERD (gastroesophageal reflux disease)     takes Omeprazole daily  . Family history of adverse reaction to anesthesia     oldest brother had trouble with anesthesia a long time ago but can't recall what  . History of bronchitis   . Vasovagal episode back in the 80's  . Weakness     right leg  . Joint pain   . Chronic back pain     stenosis  . Joint swelling   . Hemorrhoids     Past Surgical History  Procedure Laterality Date  . Nissen fundoplication  8756  . Retinal laser procedure Right 02/01/2011    right eye   . Nasal septum surgery  1970  . Knee arthroscopy Right 1994    x 3  . 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 Carpal tunnel release Bilateral 2004  . Trigger finger release Bilateral 2005  . Abdominal hysterectomy  1997  . Varicose vein surgery Bilateral 2007, 2009    left 2007, right 2009  . Mallet finger Right 2011  . Vitrectomy  Right 2012  . Cataract extraction Right 2013    Dr Ellison Hughs  . Carpometacarpal (cmc) fusion of thumb  2015    thumb  . Tonsillectomy  1981  . Nissen fundoplication    . Colonoscopy    . Colonoscopy with esophagogastroduodenoscopy (egd) and esophageal dilation (ed)    . Eye surgery      scar tissue remove  . Lumbar laminectomy/decompression microdiscectomy Right 02/11/2015    Procedure: Laminectomy and Foraminotomy - Right - L3-L4;  Surgeon: Earnie Larsson, MD;  Location: [facility];  Service: Neurosurgery;  Laterality: Right;  Laminectomy and Foraminotomy - Right - L3-L4    There were no vitals filed for this visit.  Visit Diagnosis:  Pain in joint, ankle and foot, right  Difficulty walking      Subjective Assessment - 06/26/15 1413    Subjective Patient reporting increased achiness "all over" (ankle, knees and hips) but thinks some of it is coming from the cold weather. Right foot ankle pain now more localized at top of foot/ankle and no longer in arch. States able to sleep without foot/ankle pain.   How long can you walk comfortably? Able to walk  fro ~1 hr in [place] today without pain   Currently in Pain? Yes   Pain Score 3    Pain Location Ankle   Pain Orientation Right;Upper   Pain Descriptors / Indicators Aching            [facility] PT Assessment - 06/26/15 1408    Assessment   Medical Diagnosis Right posterior tibial tendinitis   ROM / Strength   AROM / PROM / Strength AROM;Strength   AROM   AROM Assessment Site Ankle   Right/Left Ankle Right   Right Ankle Dorsiflexion 10   Right Ankle Plantar Flexion 58   Right Ankle Inversion 18   Right Ankle Eversion 18   Strength   Strength Assessment Site Ankle   Right/Left Ankle Right   Right Ankle Dorsiflexion 4+/5   Right Ankle Plantar Flexion 4+/5   Right Ankle Inversion 4/5   Right Ankle Eversion 4+/5           TODAY'S TREATMENT  ROM, MMT, Goal assessment  TherEx Nustep lvl 5 x3' Rocker DF Rt ankle stretch  3x30" Foam Pad:  Rt SLS with light single pole A 3x30"  Tandem stance 2x30" each with light single pole A Rt SLS on blue foam oval with vectors (fwd/side/back) x10           PT Education - 06/26/15 1929    Education provided Yes   Education Details Final HEP - SLS and tandem stance activities, Instructions in self progression of HEP, Proper squat technique   Person(s) Educated Patient   Methods Explanation;Demonstration;Handout   Comprehension Verbalized understanding;Returned demonstration          PT Short Term Goals - 04/21/15 0906    PT SHORT TERM GOAL #1   Title pt independent with initial HEP by 03/28/15   Status Achieved           PT Long Term Goals - 06/26/15 1430    PT LONG TERM GOAL #1   Title Patient will be independent with advanced HEP as necessary (06/26/15)   Status Achieved   PT LONG TERM GOAL #2   Title Patient will demonstrate right ankle strength 4/5 or greater without pain for improved ankle stability (06/26/15)   Status Achieved   PT LONG TERM GOAL #3   Title Patient will be able to walk for 20 minutes without increased pain (06/26/15)   Status Achieved   PT LONG TERM GOAL #4   Title Patient able to return to performing chores, exercise, and recreational activities without limitation by foot/ankle pain (06/26/15)   Status Achieved               Plan - 06/26/15 1912    Clinical Impression Statement Patient has demonstrated good progress with PT with posterior tibialis tenonitis pain resolved, right ankle ROM improved to within a few degrees of left, and right ankle strength 4/5 to 4+/5. Patient reporting no further sleep disturbance due to pain and has been able to walk with just orthotics in shoes (no ankle brace) for over an hour without right foot/ankle pain for which she was referred to PT. She is independent with HEP and aware of appropriate progression of HEP. All goals for this episode have been met. Patient has noted new pain  in superior right ankle at anterior tibialis tendon along with knee and hip pain bilaterally recently, but wants to follow up with MD before seeking treatment for these issues. Patient wants to try continuing on own with HEP,  but would like to keep chart open for 30 days in event of reoccurrence of pain from right posterior tibialis tendonitis. If no need to return within 30 days, will proceed with discharge.   PT Next Visit Plan 30 day hold   Consulted and Agree with Plan of Care Patient          G-Codes - 2015/07/22 1927    Functional Assessment Tool Used Foot - 64% (36% limitation)   Functional Limitation Mobility: Walking and moving around   Mobility: Walking and Moving Around Current Status [phone]) At least 20 percent but less than 40 percent impaired, limited or restricted   Mobility: Walking and Moving Around Goal Status [phone]) At least 40 percent but less than 60 percent impaired, limited or restricted   Mobility: Walking and Moving Around Discharge Status [phone]) At least 20 percent but less than 40 percent impaired, limited or restricted      Problem List Patient Active Problem List   Diagnosis Date Noted  . Idiopathic urticaria 06/02/2015  . Chronic rhinitis 06/02/2015  . Urticarial rash 03/14/2015  . 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 Lumbar stenosis 02/11/2015  . Abdominal pain, chronic, right lower quadrant 01/07/2015  . Right hip pain 10/31/2014  . Vitamin D deficiency 10/31/2014  . Osteopenia 10/18/2014  . OSA (obstructive sleep apnea) 07/16/2014  . Migraines 07/16/2014  . TMJ (dislocation of temporomandibular joint) 07/16/2014  . HLD (hyperlipidemia) 03/22/2014  . S/P Nissen fundoplication May 4758 30/74/6002  . Dysphagia 01/24/2008  . DEPRESSION/ANXIETY 12/02/2007  . ESOPHAGEAL STRICTURE 12/02/2007  . GERD 12/02/2007  . Irritable bowel syndrome 12/02/2007  . Fibromyalgia 12/02/2007  . VASOVAGAL SYNCOPE 12/02/2007     Percival Spanish, PT, MPT 2015-07-22, 7:33 PM  [facility] [address]  [place], [place], [address] Phone: [phone]   Fax:  [phone]  Name: Vanessa Oliver MRN: [id number] Date of Birth: 1950-12-23

## 2015-06-27 DIAGNOSIS — R202 Paresthesia of skin: Secondary | ICD-10-CM | POA: Diagnosis not present

## 2015-06-27 DIAGNOSIS — M79604 Pain in right leg: Secondary | ICD-10-CM | POA: Diagnosis not present

## 2015-06-29 ENCOUNTER — Encounter: Payer: Self-pay | Admitting: Family

## 2015-07-02 ENCOUNTER — Encounter: Payer: Self-pay | Admitting: Family

## 2015-07-02 ENCOUNTER — Ambulatory Visit (INDEPENDENT_AMBULATORY_CARE_PROVIDER_SITE_OTHER): Payer: Medicare Other | Admitting: Family

## 2015-07-02 VITALS — BP 135/67 | HR 91 | Temp 98.0°F | Resp 18 | Ht 64.0 in | Wt 173.0 lb

## 2015-07-02 DIAGNOSIS — R5382 Chronic fatigue, unspecified: Secondary | ICD-10-CM

## 2015-07-02 DIAGNOSIS — L501 Idiopathic urticaria: Secondary | ICD-10-CM | POA: Diagnosis not present

## 2015-07-02 LAB — COMPREHENSIVE METABOLIC PANEL
ALK PHOS: 77 U/L (ref 39–117)
ALT: 15 U/L (ref 0–35)
AST: 17 U/L (ref 0–37)
Albumin: 4.3 g/dL (ref 3.5–5.2)
BUN: 17 mg/dL (ref 6–23)
CO2: 26 mEq/L (ref 19–32)
CREATININE: 0.77 mg/dL (ref 0.40–1.20)
Calcium: 9.6 mg/dL (ref 8.4–10.5)
Chloride: 103 mEq/L (ref 96–112)
GFR: 79.71 mL/min (ref >60.00)
GLUCOSE: 126 mg/dL — AB (ref 70–99)
POTASSIUM: 3.8 meq/L (ref 3.5–5.1)
SODIUM: 139 meq/L (ref 135–145)
TOTAL PROTEIN: 7.1 g/dL (ref 6.0–8.3)
Total Bilirubin: 0.5 mg/dL (ref 0.2–1.2)

## 2015-07-02 LAB — CBC WITH DIFFERENTIAL/PLATELET
Basophils Absolute: 0 10*3/uL (ref 0.0–0.1)
Basophils Relative: 0.6 % (ref 0.0–3.0)
EOS PCT: 2.7 % (ref 0.0–5.0)
Eosinophils Absolute: 0.2 10*3/uL (ref 0.0–0.7)
HCT: 44.5 % (ref 36.0–46.0)
Hemoglobin: 14.9 g/dL (ref 12.0–15.0)
LYMPHS ABS: 2.7 10*3/uL (ref 0.7–4.0)
Lymphocytes Relative: 39.8 % (ref 12.0–46.0)
MCHC: 33.5 g/dL (ref 30.0–36.0)
MCV: 92.1 fl (ref 78.0–100.0)
MONOS PCT: 8.1 % (ref 3.0–12.0)
Monocytes Absolute: 0.6 10*3/uL (ref 0.1–1.0)
NEUTROS ABS: 3.3 10*3/uL (ref 1.4–7.7)
NEUTROS PCT: 48.8 % (ref 43.0–77.0)
PLATELETS: 314 10*3/uL (ref 150.0–400.0)
RBC: 4.83 Mil/uL (ref 3.87–5.11)
RDW: 13.4 % (ref 11.5–15.5)
WBC: 6.8 10*3/uL (ref 4.0–10.5)

## 2015-07-02 LAB — TSH: TSH: 1.62 u[IU]/mL (ref 0.35–4.50)

## 2015-07-02 NOTE — Progress Notes (Signed)
Pre visit review using our clinic review tool, if applicable. No additional management support is needed unless otherwise documented below in the visit note. 

## 2015-07-02 NOTE — Assessment & Plan Note (Signed)
Advised pt to add in the zantac to see if this helps.

## 2015-07-02 NOTE — Patient Instructions (Addendum)
Please complete lab work prior to leaving. Wear cpap when you nap.  Add zantac per allergist recommendations.

## 2015-07-02 NOTE — Progress Notes (Signed)
Subjective:    Patient ID: Vanessa Oliver, female    DOB: 10/24/1950, 64 y.o.   MRN: [id number]  HPI  Vanessa Oliver is a 64 yr old female who presents today with chief complaint of intermittent hives. She has not started zantac which was recommended by her allergist.  Reports urticaria occurred following EMG.  Urticaria has resolved. Reports + brusing at site of EMG right thigh. Reports that she she had foot itching earlier today.  Hives are now resolved.    Feels very tired.  Reports good compliance with CPAP.  Does not use if she naps.  She naps almost every day. Does not feel more tired since she started using gabapentin.  Has had a lot of stress with taking care of her mother and helping to clean out her mother's home.  Review of Systems See HPI  Past Medical History  Diagnosis Date  . Arthritis   . Hyperlipidemia   . Hiatal hernia   . Fibromyalgia   . IBS (irritable bowel syndrome)   . Esophageal stricture   . Plantar fasciitis   . Rosacea   . TMJ (temporomandibular joint disorder)   . Vasovagal syncope   . Allergy     takes Singulair at night  . Fibromyalgia   . Depression     takes CYmbalta daily  . GERD (gastroesophageal reflux disease)     takes Omeprazole daily  . Family history of adverse reaction to anesthesia     oldest brother had trouble with anesthesia a long time ago but can't recall what  . History of bronchitis   . Vasovagal episode back in the 80's  . Weakness     right leg  . Joint pain   . Chronic back pain     stenosis  . Joint swelling   . Hemorrhoids     Social History   Social History  . Marital Status: Married    Spouse Name: N/A  . Number of Children: 0  . Years of Education: N/A   Occupational History  . clerical    Social History Main Topics  . Smoking status: Never Smoker   . Smokeless tobacco: Never Used  . Alcohol Use: 0.0 oz/week    0 Standard drinks or equivalent per week     Comment: rarely  . Drug Use: No  . Sexual  Activity: Yes    Birth Control/ Protection: Surgical   Other Topics Concern  . Not on file   Social History Narrative   Works as an Glass blower/designer   Married- husband is 60 years older than her   Former Dance movement psychotherapist up up [employer]   Has cats   Enjoys reading   No children    Past Surgical History  Procedure Laterality Date  . Nissen fundoplication  0000000  . Retinal laser procedure Right 02/01/2011    right eye   . Nasal septum surgery  1970  . Knee arthroscopy Right 1994    x 3  . 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 Carpal tunnel release Bilateral 2004  . Trigger finger release Bilateral 2005  . Abdominal hysterectomy  1997  . Varicose vein surgery Bilateral 2007, 2009    left 2007, right 2009  . Mallet finger Right 2011  . Vitrectomy Right 2012  . Cataract extraction Right 2013    Dr Ellison Hughs  . Carpometacarpal (cmc) fusion of thumb  2015    thumb  .  Tonsillectomy  1981  . Nissen fundoplication    . Colonoscopy    . Colonoscopy with esophagogastroduodenoscopy (egd) and esophageal dilation (ed)    . Eye surgery      scar tissue remove  . Lumbar laminectomy/decompression microdiscectomy Right 02/11/2015    Procedure: Laminectomy and Foraminotomy - Right - L3-L4;  Surgeon: Earnie Larsson, MD;  Location: [facility];  Service: Neurosurgery;  Laterality: Right;  Laminectomy and Foraminotomy - Right - L3-L4    Family History  Problem Relation Age of Onset  . Breast cancer Mother   . Arthritis Mother   . Hyperlipidemia Mother   . Hypertension Mother   . Lung cancer Father   . Pancreatic cancer Father   . Skin cancer Father   . Arthritis Father   . Celiac disease Brother   . Hyperlipidemia Maternal Grandfather   . Heart disease Maternal Grandfather   . Stroke Maternal Grandfather     Allergies  Allergen Reactions  . Cefuroxime Axetil     Hives / "throat swelling"  . Chlorzoxazone Rash and Other (See Comments)    "breathing problems"   . Oxycodone      Hallucinations, rash  . Penicillins Anaphylaxis  . Adhesive [Tape]     rash  . Cataflam [Diclofenac Potassium]     Lip swelling  . Clarithromycin Diarrhea  . Flagyl [Metronidazole Hcl]     ?diarrhea  . Glucosamine Hives  . Guaifenesin & Derivatives     Stomach upset  . Hydrocodone-Acetaminophen     REACTION: rash, tightening of throat  . Ketorolac Tromethamine     ?reaction  . Pentazocine     Involuntary twitching  . Pneumovax [Pneumococcal Polysaccharide Vaccine] Other (See Comments)    Redness/swelling at site, nausea  . Smz-Tmp Ds [Sulfamethoxazole W/Trimethoprim (Co-Trimoxazole)]     ?unknown reaction  . Tramadol     constipation  . Trazodone And Nefazodone Other (See Comments)    Scratchy throat / congestion    Current Outpatient Prescriptions on File Prior to Visit  Medication Sig Dispense Refill  . aspirin 81 MG tablet Take 81 mg by mouth daily.    . Azelaic Acid (FINACEA) 15 % cream Apply topically 1 day or 1 dose. After skin is thoroughly washed and patted dry, gently but thoroughly massage a thin film of azelaic acid cream into the affected area twice daily, in the morning and evening.     . benzoyl peroxide 5 % gel Apply 1 application topically daily.     . Calcium Carbonate-Vitamin D (CALTRATE 600+D) 600-400 MG-UNIT per tablet Take 1 tablet by mouth 2 (two) times daily. (Patient taking differently: Take 2 tablets by mouth 2 (two) times daily. )    . cetirizine (ZYRTEC) 10 MG tablet Take 10 mg by mouth daily.    . cholecalciferol (VITAMIN D) 1000 UNITS tablet Take 1,000 Units by mouth daily.     . DULoxetine (CYMBALTA) 60 MG capsule Take 1 capsule (60 mg total) by mouth daily. 90 capsule 1  . FIBER PO Take 2 capsules by mouth 2 (two) times daily.    . fluticasone (FLONASE) 50 MCG/ACT nasal spray Place 2 sprays into both nostrils daily.    Marland Kitchen gabapentin (NEURONTIN) 100 MG capsule Take 1 capsule (100 mg total) by mouth 3 (three) times daily. 270 capsule 0  .  montelukast (SINGULAIR) 10 MG tablet Take 10 mg by mouth at bedtime.    . Multiple Vitamin (MULTIVITAMIN) tablet Take 1 tablet by mouth daily.    Marland Kitchen  omeprazole (PRILOSEC) 40 MG capsule Take 1 capsule (40 mg total) by mouth 2 (two) times daily. 180 capsule 1  . Pitavastatin Calcium (LIVALO) 2 MG TABS Take 0.5 tablets (1 mg total) by mouth daily. (Patient taking differently: Take 1 mg by mouth daily. Physician ordering 2mg  tablet then patient cutting in half.) 45 tablet 1  . Probiotic Product (PROBIOTIC DAILY PO) Take 1 tablet by mouth daily.    . vitamin C (ASCORBIC ACID) 500 MG tablet Take 500 mg by mouth daily.    . VOLTAREN 1 % GEL APPLY 2 GRAMS TOPICALLY TWICE A DAY AS NEEDED 300 g 1   No current facility-administered medications on file prior to visit.    BP 135/67 mmHg  Pulse 91  Temp(Src) 98 F (36.7 C) (Oral)  Resp 18  Ht 5\' 4"  (1.626 m)  Wt 173 lb (78.472 kg)  BMI 29.68 kg/m2  SpO2 100%  LMP 05/14/1996       Objective:   Physical Exam  Constitutional: She is oriented to person, place, and time. She appears well-developed and well-nourished.  Cardiovascular: Normal rate, regular rhythm and normal heart sounds.   No murmur heard. Pulmonary/Chest: Effort normal and breath sounds normal. No respiratory distress. She has no wheezes.  Musculoskeletal: She exhibits no edema.  Neurological: She is alert and oriented to person, place, and time.  Skin:  No urticaria.  Mild ecchymosis right anterior thigh  Psychiatric: She has a normal mood and affect. Her behavior is normal. Judgment and thought content normal.          Assessment & Plan:  Fatigue- New. advised pt to use CPAP during naps.  Obtain cmet, TSH and CBC to further evaluate.

## 2015-07-04 ENCOUNTER — Other Ambulatory Visit (INDEPENDENT_AMBULATORY_CARE_PROVIDER_SITE_OTHER): Payer: Medicare Other

## 2015-07-04 DIAGNOSIS — R739 Hyperglycemia, unspecified: Secondary | ICD-10-CM | POA: Diagnosis not present

## 2015-07-04 LAB — HEMOGLOBIN A1C: Hgb A1c MFr Bld: 5.4 % (ref 4.6–6.5)

## 2015-07-07 ENCOUNTER — Encounter: Payer: Self-pay | Admitting: Family

## 2015-07-08 ENCOUNTER — Encounter: Payer: Self-pay | Admitting: Family

## 2015-07-09 ENCOUNTER — Ambulatory Visit: Payer: Medicare Other | Admitting: Family

## 2015-07-09 DIAGNOSIS — Z6829 Body mass index (BMI) 29.0-29.9, adult: Secondary | ICD-10-CM | POA: Diagnosis not present

## 2015-07-09 DIAGNOSIS — M5416 Radiculopathy, lumbar region: Secondary | ICD-10-CM | POA: Diagnosis not present

## 2015-07-09 MED ORDER — PRAVASTATIN SODIUM 20 MG PO TABS: 20.0000 mg | ORAL_TABLET | Freq: Every day | ORAL | Status: DC

## 2015-07-09 NOTE — Telephone Encounter (Signed)
Livalo d/c, Pravastatin 20 mg 1 tablet qhs sent to [facility], #30 and 0RF. Pt informed via MyChart. Routing to [place] just as an Pharmacist, hospital.

## 2015-07-15 ENCOUNTER — Ambulatory Visit: Payer: Self-pay | Admitting: Family

## 2015-07-21 DIAGNOSIS — M4806 Spinal stenosis, lumbar region: Secondary | ICD-10-CM | POA: Diagnosis not present

## 2015-07-21 DIAGNOSIS — M2011 Hallux valgus (acquired), right foot: Secondary | ICD-10-CM | POA: Diagnosis not present

## 2015-07-21 DIAGNOSIS — Z6829 Body mass index (BMI) 29.0-29.9, adult: Secondary | ICD-10-CM | POA: Diagnosis not present

## 2015-07-21 DIAGNOSIS — M76821 Posterior tibial tendinitis, right leg: Secondary | ICD-10-CM | POA: Diagnosis not present

## 2015-07-21 DIAGNOSIS — M5416 Radiculopathy, lumbar region: Secondary | ICD-10-CM | POA: Diagnosis not present

## 2015-07-22 DIAGNOSIS — Z6829 Body mass index (BMI) 29.0-29.9, adult: Secondary | ICD-10-CM | POA: Diagnosis not present

## 2015-07-22 DIAGNOSIS — M5416 Radiculopathy, lumbar region: Secondary | ICD-10-CM | POA: Diagnosis not present

## 2015-07-22 DIAGNOSIS — M4806 Spinal stenosis, lumbar region: Secondary | ICD-10-CM | POA: Diagnosis not present

## 2015-07-30 DIAGNOSIS — M76821 Posterior tibial tendinitis, right leg: Secondary | ICD-10-CM | POA: Diagnosis not present

## 2015-08-04 DIAGNOSIS — M76821 Posterior tibial tendinitis, right leg: Secondary | ICD-10-CM | POA: Diagnosis not present

## 2015-08-05 ENCOUNTER — Ambulatory Visit: Payer: Medicare Other | Attending: Orthopedic Surgery | Admitting: Physical Therapy

## 2015-08-05 ENCOUNTER — Encounter: Payer: Self-pay | Admitting: Physical Therapy

## 2015-08-05 DIAGNOSIS — M25571 Pain in right ankle and joints of right foot: Secondary | ICD-10-CM | POA: Diagnosis not present

## 2015-08-05 NOTE — Therapy (Signed)
[facility] [address]  [place], [place], [address] Phone: [phone]   Fax:  [phone]  Physical Therapy Treatment  Patient Details  Name: Vanessa Oliver MRN: [id number] Date of Birth: Mar 30, 1951 Referring Provider: Wylene Simmer  Encounter Date: 08/05/2015      PT End of Session - 08/05/15 0910    Visit Number 12   Number of Visits 23   Date for PT Re-Evaluation 09/16/15   PT Start Time 0855   PT Stop Time 0935   PT Time Calculation (min) 40 min      Past Medical History  Diagnosis Date  . Arthritis   . Hyperlipidemia   . Hiatal hernia   . Fibromyalgia   . IBS (irritable bowel syndrome)   . Esophageal stricture   . Plantar fasciitis   . Rosacea   . TMJ (temporomandibular joint disorder)   . Vasovagal syncope   . Allergy     takes Singulair at night  . Fibromyalgia   . Depression     takes CYmbalta daily  . GERD (gastroesophageal reflux disease)     takes Omeprazole daily  . Family history of adverse reaction to anesthesia     oldest brother had trouble with anesthesia a long time ago but can't recall what  . History of bronchitis   . Vasovagal episode back in the 80's  . Weakness     right leg  . Joint pain   . Chronic back pain     stenosis  . Joint swelling   . Hemorrhoids     Past Surgical History  Procedure Laterality Date  . Nissen fundoplication  0174  . Retinal laser procedure Right 02/01/2011    right eye   . Nasal septum surgery  1970  . Knee arthroscopy Right 1994    x 3  . 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 Carpal tunnel release Bilateral 2004  . Trigger finger release Bilateral 2005  . Abdominal hysterectomy  1997  . Varicose vein surgery Bilateral 2007, 2009    left 2007, right 2009  . Mallet finger Right 2011  . Vitrectomy Right 2012  . Cataract extraction Right 2013    Dr Ellison Hughs  . Carpometacarpal (cmc) fusion of thumb  2015    thumb  .  Tonsillectomy  1981  . Nissen fundoplication    . Colonoscopy    . Colonoscopy with esophagogastroduodenoscopy (egd) and esophageal dilation (ed)    . Eye surgery      scar tissue remove  . Lumbar laminectomy/decompression microdiscectomy Right 02/11/2015    Procedure: Laminectomy and Foraminotomy - Right - L3-L4;  Surgeon: Earnie Larsson, MD;  Location: [place] [facility];  Service: Neurosurgery;  Laterality: Right;  Laminectomy and Foraminotomy - Right - L3-L4    There were no vitals filed for this visit.  Visit Diagnosis:  Pain in joint, ankle and foot, right - Plan: PT plan of care cert/re-cert      Subjective Assessment - 08/05/15 0859    Subjective pt returning to OPPT due to R foot/ankle pain.  She was last seen here on 06/26/15 at which time she was placed on hold.  She states her "arch pain" is much better but still with anterior and medial ankle pain which was noted at her last PT visit.  States pain wakes her at times.  Denies n/t.   Pertinent History l-spine epidural early January 2017.  Diagnostic tests Recent MRI of R ankle did not find anything per pt report   Currently in Pain? Yes   Pain Score --  AVG pain 4-5/10 with activity, 8/10 at worst   Pain Location Ankle   Pain Orientation Right;Anterior;Medial   Pain Descriptors / Indicators Aching   Pain Onset More than a month ago   Pain Frequency Intermittent   Aggravating Factors  driving (due to repeated DF), sitting with LE elevated   Pain Relieving Factors sitting with feet on floor            [facility] PT Assessment - 08/05/15 0001    Assessment   Medical Diagnosis Right posterior tibial tendinitis   Referring Provider Wylene Simmer   Next MD Visit 09/19/15   Balance Screen   Has the patient fallen in the past 6 months Yes   How many times? 1   Has the patient had a decrease in activity level because of a fear of falling?  No   Is the patient reluctant to leave their home because of a fear of falling?  No    Observation/Other Assessments   Focus on Therapeutic Outcomes (FOTO)  Foto 45% limitation   ROM / Strength   AROM / PROM / Strength Strength   Strength   Strength Assessment Site Hip   Right/Left Hip Right   Right Hip Flexion 4/5   Right Hip Extension 4/5   Right Hip External Rotation  4+/5   Right Hip Internal Rotation 4+/5   Right Hip ABduction 4/5   Right/Left Knee Right   Right Knee Flexion 4+/5  no pain; 5/5 on L   Right Knee Extension 5/5   Right Ankle Dorsiflexion 5/5  anterior ankle pain if knee extended, no pain if knee flexed   Right Ankle Inversion 4+/5   Right Ankle Eversion 4+/5       TODAY'S TREATMENT: R LE MMT and ROM  TherEx - R Ankle stretch all planes Bridge 10x R LE nerve glides and instruct to perform as HEP  Ionto 4-6 Patch #1/6 with 80 mA.min dex applied to medial R ankle          PT Long Term Goals - 08/05/15 1444    PT LONG TERM GOAL #1   Title Patient will be independent with advanced HEP as necessary by 09/16/15   Status On-going   PT LONG TERM GOAL #2   Title Patient will demonstrate right ankle strength 4/5 or greater without pain for improved ankle stability   Status Achieved   PT LONG TERM GOAL #3   Title Pt able to drive without noting R ankle pain by 09/16/15   Status New   PT LONG TERM GOAL #4   Title Patient able to return to performing chores, exercise, and recreational activities without limitation by foot/ankle pain by 09/16/15  Had been met in the past, but pain increased lately   Status On-going               Plan - 08/05/15 1153    Clinical Impression Statement pt returns to PT today after being placed on hold on 06/26/15.  Since she's returning for same issue and a discharge was not written, we are continuing with existing POC rather than initiating a new POC today.  Assessment today seems to implicate at least a partial neural involvement in her pain.  She notes anterior / medial foot pain which extends to big toe  with seated ankle DF (AROM  or PROM) if her knee is extended but not if flexed (POS [place]).  Pain also noted in R hip and posteiror LE with SLR but not really noted at ankle with SLR (Inconclusive SLR).  Minimal to no pain with MMT when performed seated with knee flexed but notes pain when performed with knee extended. There is mild TTP noted to medial foot/ankle in area of posterior tib tendon.  We will treat with ankle as well as pelvic/hip stability exercises along with neural mobility activities and modalities PRN.   Pt will benefit from skilled therapeutic intervention in order to improve on the following deficits Pain;Impaired flexibility;Decreased range of motion;Decreased strength;Difficulty walking;Abnormal gait;Decreased activity tolerance;Decreased balance   Rehab Potential Good   PT Frequency 2x / week   PT Duration 6 weeks   PT Treatment/Interventions Manual techniques;Passive range of motion;Therapeutic exercise;Therapeutic activities;Balance training;Neuromuscular re-education;Gait training;Ultrasound;Electrical Stimulation;Cryotherapy;Vasopneumatic Device;Iontophoresis 11m/ml Dexamethasone;Taping;Dry needling;Patient/family education   PT Next Visit Plan R LE nerve glides; ionto to medial ankle; some lumbopelvic stability exercises; ankle ROM and stability exercises   Consulted and Agree with Plan of Care Patient          G-Codes - 02017/02/200911    Functional Assessment Tool Used Foot - 45% limitation   Functional Limitation Mobility: Walking and moving around   Mobility: Walking and Moving Around Current Status (([id number] At least 40 percent but less than 60 percent impaired, limited or restricted   Mobility: Walking and Moving Around Goal Status ([phone] At least 20 percent but less than 40 percent impaired, limited or restricted      Problem List Patient Active Problem List   Diagnosis Date Noted  . Idiopathic urticaria 06/02/2015  . Chronic rhinitis 06/02/2015  . Spinal  stenosis, lumbar region, with neurogenic claudication 02/11/2015  . Lumbar stenosis 02/11/2015  . Abdominal pain, chronic, right lower quadrant 01/07/2015  . Right hip pain 10/31/2014  . Vitamin D deficiency 10/31/2014  . Osteopenia 10/18/2014  . OSA (obstructive sleep apnea) 07/16/2014  . Migraines 07/16/2014  . TMJ (dislocation of temporomandibular joint) 07/16/2014  . HLD (hyperlipidemia) 03/22/2014  . S/P Nissen fundoplication May [id number]/0998 . Dysphagia 01/24/2008  . DEPRESSION/ANXIETY 12/02/2007  . ESOPHAGEAL STRICTURE 12/02/2007  . GERD 12/02/2007  . Irritable bowel syndrome 12/02/2007  . Fibromyalgia 12/02/2007  . VASOVAGAL SYNCOPE 12/02/2007    Vickey Ewbank PT, OCS 12017/02/20 3:02 PM  [facility] [place] [address]Phone: [phone]  Fax:  [phone] Name: Vanessa WINIARSKIMRN: [id number] of Birth: 111-19-1952

## 2015-08-07 ENCOUNTER — Other Ambulatory Visit: Payer: Self-pay | Admitting: Family

## 2015-08-08 NOTE — Telephone Encounter (Signed)
Refilled patients rx request with #[id number] with 0 rf

## 2015-08-15 ENCOUNTER — Ambulatory Visit: Payer: Medicare Other | Attending: Orthopedic Surgery | Admitting: Physical Therapy

## 2015-08-15 DIAGNOSIS — M25571 Pain in right ankle and joints of right foot: Secondary | ICD-10-CM | POA: Insufficient documentation

## 2015-08-15 DIAGNOSIS — M545 Low back pain, unspecified: Secondary | ICD-10-CM

## 2015-08-15 DIAGNOSIS — R262 Difficulty in walking, not elsewhere classified: Secondary | ICD-10-CM

## 2015-08-15 NOTE — Therapy (Signed)
[facility] [address]  [place], [place], [address] Phone: [phone]   Fax:  [phone]  Physical Therapy Treatment  Patient Details  Name: Vanessa Oliver MRN: [id number] Date of Birth: Nov 29, 1950 Referring Provider: Wylene Simmer  Encounter Date: 08/15/2015      PT End of Session - 08/15/15 0859    Visit Number 13   Number of Visits 23   Date for PT Re-Evaluation 09/16/15   PT Start Time 0858  PT late   PT Stop Time 0939   PT Time Calculation (min) 41 min      Past Medical History  Diagnosis Date  . Arthritis   . Hyperlipidemia   . Hiatal hernia   . Fibromyalgia   . IBS (irritable bowel syndrome)   . Esophageal stricture   . Plantar fasciitis   . Rosacea   . TMJ (temporomandibular joint disorder)   . Vasovagal syncope   . Allergy     takes Singulair at night  . Fibromyalgia   . Depression     takes CYmbalta daily  . GERD (gastroesophageal reflux disease)     takes Omeprazole daily  . Family history of adverse reaction to anesthesia     oldest brother had trouble with anesthesia a long time ago but can't recall what  . History of bronchitis   . Vasovagal episode back in the 80's  . Weakness     right leg  . Joint pain   . Chronic back pain     stenosis  . Joint swelling   . Hemorrhoids     Past Surgical History  Procedure Laterality Date  . Nissen fundoplication  0000000  . Retinal laser procedure Right 02/01/2011    right eye   . Nasal septum surgery  1970  . Knee arthroscopy Right 1994    x 3  . 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 Carpal tunnel release Bilateral 2004  . Trigger finger release Bilateral 2005  . Abdominal hysterectomy  1997  . Varicose vein surgery Bilateral 2007, 2009    left 2007, right 2009  . Mallet finger Right 2011  . Vitrectomy Right 2012  . Cataract extraction Right 2013    Dr Ellison Hughs  . Carpometacarpal (cmc) fusion of thumb  2015   thumb  . Tonsillectomy  1981  . Nissen fundoplication    . Colonoscopy    . Colonoscopy with esophagogastroduodenoscopy (egd) and esophageal dilation (ed)    . Eye surgery      scar tissue remove  . Lumbar laminectomy/decompression microdiscectomy Right 02/11/2015    Procedure: Laminectomy and Foraminotomy - Right - L3-L4;  Surgeon: Earnie Larsson, MD;  Location: [place] [facility];  Service: Neurosurgery;  Laterality: Right;  Laminectomy and Foraminotomy - Right - L3-L4    There were no vitals filed for this visit.  Visit Diagnosis:  Pain in joint, ankle and foot, right  Difficulty walking  Bilateral low back pain without sciatica      Subjective Assessment - 08/15/15 0902    Subjective pt states has been performing stretches/nerve glides and notes decreased pain with this.   Currently in Pain? Yes   Pain Score --  only notes pain with DF ROM which she rates 7/10   Multiple Pain Sites Yes   Pain Score 5   Pain Location Back   Pain Orientation Lower  R>L        TODAY'S  TREATMENT TherEx - Bridge 15x Stretch HS, Piri, SKTC  Manual - B Le nerve glides, MFR through pectoabdominal fascia in [place] with R LE off plinth edge R Ankle grade 3 and 4 talocrural distraction  Bridge on Heels with Black TB at knees 10x3" TRX DL Squat and Heel Raise 10x Side-Stepping with Yellow TB at B forefoot 10' each Rocker DF Ankle Stretch 3x20" Quadruped LE/UE 6x2" Standing Hip Add with Red TB at forefoot 10x each  Ionto 4-6 Patch #2/6 with 80 mA.min dex applied to medial R ankle - pt advised to remove between 1:30 and 3:30 PM                PT Long Term Goals - 08/15/15 1003    PT LONG TERM GOAL #1   Title Patient will be independent with advanced HEP as necessary by 09/16/15   Status On-going   PT LONG TERM GOAL #2   Title Patient will demonstrate right ankle strength 4/5 or greater without pain for improved ankle stability   Status Achieved   PT LONG TERM GOAL #3   Title Pt  able to drive without noting R ankle pain by 09/16/15   Status On-going   PT LONG TERM GOAL #4   Title Patient able to return to performing chores, exercise, and recreational activities without limitation by foot/ankle pain by 09/16/15   Status On-going               Plan - 08/15/15 0940    Clinical Impression Statement notes benefit with LE nerve glides since last treatment; today's treatment well tolerated.   PT Next Visit Plan R LE nerve glides; ionto to medial ankle; some lumbopelvic stability exercises; ankle ROM and stability exercises   Consulted and Agree with Plan of Care Patient        Problem List Patient Active Problem List   Diagnosis Date Noted  . Idiopathic urticaria 06/02/2015  . Chronic rhinitis 06/02/2015  . 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 Lumbar stenosis 02/11/2015  . Abdominal pain, chronic, right lower quadrant 01/07/2015  . Right hip pain 10/31/2014  . Vitamin D deficiency 10/31/2014  . Osteopenia 10/18/2014  . OSA (obstructive sleep apnea) 07/16/2014  . Migraines 07/16/2014  . TMJ (dislocation of temporomandibular joint) 07/16/2014  . HLD (hyperlipidemia) 03/22/2014  . S/P Nissen fundoplication May 0000000 0000000  . Dysphagia 01/24/2008  . DEPRESSION/ANXIETY 12/02/2007  . ESOPHAGEAL STRICTURE 12/02/2007  . GERD 12/02/2007  . Irritable bowel syndrome 12/02/2007  . Fibromyalgia 12/02/2007  . VASOVAGAL SYNCOPE 12/02/2007    Tyree Fluharty PT, OCS 08/15/2015, 10:03 AM  [facility] [place], [place], [address] Phone: [phone]   Fax:  [phone]  Name: Vanessa Oliver MRN: [id number] Date of Birth: 1951-02-13

## 2015-08-18 ENCOUNTER — Ambulatory Visit: Payer: Medicare Other

## 2015-08-18 DIAGNOSIS — R262 Difficulty in walking, not elsewhere classified: Secondary | ICD-10-CM | POA: Diagnosis not present

## 2015-08-18 DIAGNOSIS — M545 Low back pain: Secondary | ICD-10-CM | POA: Diagnosis not present

## 2015-08-18 DIAGNOSIS — M25571 Pain in right ankle and joints of right foot: Secondary | ICD-10-CM | POA: Diagnosis not present

## 2015-08-18 NOTE — Therapy (Signed)
[facility] [address]  [place], [place], [address] Phone: [phone]   Fax:  [phone]  Physical Therapy Treatment  Patient Details  Name: Vanessa Oliver MRN: [id number] Date of Birth: May 08, 1951 Referring Provider: Wylene Simmer  Encounter Date: 08/18/2015      PT End of Session - 08/18/15 1358    Visit Number 14   Number of Visits 23   Date for PT Re-Evaluation 09/16/15   PT Start Time [id number]   PT Stop Time 1445   PT Time Calculation (min) 46 min   Activity Tolerance Patient tolerated treatment well   Behavior During Therapy [facility] for tasks assessed/performed      Past Medical History  Diagnosis Date  . Arthritis   . Hyperlipidemia   . Hiatal hernia   . Fibromyalgia   . IBS (irritable bowel syndrome)   . Esophageal stricture   . Plantar fasciitis   . Rosacea   . TMJ (temporomandibular joint disorder)   . Vasovagal syncope   . Allergy     takes Singulair at night  . Fibromyalgia   . Depression     takes CYmbalta daily  . GERD (gastroesophageal reflux disease)     takes Omeprazole daily  . Family history of adverse reaction to anesthesia     oldest brother had trouble with anesthesia a long time ago but can't recall what  . History of bronchitis   . Vasovagal episode back in the 80's  . Weakness     right leg  . Joint pain   . Chronic back pain     stenosis  . Joint swelling   . Hemorrhoids     Past Surgical History  Procedure Laterality Date  . Nissen fundoplication  0000000  . Retinal laser procedure Right 02/01/2011    right eye   . Nasal septum surgery  1970  . Knee arthroscopy Right 1994    x 3  . 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 Carpal tunnel release Bilateral 2004  . Trigger finger release Bilateral 2005  . Abdominal hysterectomy  1997  . Varicose vein surgery Bilateral 2007, 2009    left 2007, right 2009  . Mallet finger Right 2011  . Vitrectomy Right 2012   . Cataract extraction Right 2013    Dr Ellison Hughs  . Carpometacarpal (cmc) fusion of thumb  2015    thumb  . Tonsillectomy  1981  . Nissen fundoplication    . Colonoscopy    . Colonoscopy with esophagogastroduodenoscopy (egd) and esophageal dilation (ed)    . Eye surgery      scar tissue remove  . Lumbar laminectomy/decompression microdiscectomy Right 02/11/2015    Procedure: Laminectomy and Foraminotomy - Right - L3-L4;  Surgeon: Earnie Larsson, MD;  Location: [place] [facility];  Service: Neurosurgery;  Laterality: Right;  Laminectomy and Foraminotomy - Right - L3-L4    There were no vitals filed for this visit.  Visit Diagnosis:  Pain in joint, ankle and foot, right      Subjective Assessment - 08/18/15 1403    Subjective Pt. states, "the ankle isn't bothering me right now.".  No pain or other concerns reported.     Currently in Pain? No/denies   Multiple Pain Sites No     TODAY'S TREATMENT TherEx - Stretch HS, Piri, SKTC x 30 sec - Bridge on with heels on peanut p-ball 15x2" - TRX DL Squat and Heel  Raise 15 x  - Side-Stepping with red TB at B forefoot 2 x 20' each - Rocker DF Ankle Stretch 2x30" - Standing R Hip Add with Red TB at forefoot 15x each - Standing R Hip Abd with Red TB at forefoot 15x each - Double leg balance / staggered balance / tandem balance on blue foam pad x 30 sec each way; 1 UE support on black pole; perturbations added in DLS - Side stepping on blue foam beam down / back x 5; UE touch-down as needed  - R Ankle 4-way in supine with red TB x 15 reps         PT Long Term Goals - 08/15/15 1003    PT LONG TERM GOAL #1   Title Patient will be independent with advanced HEP as necessary by 09/16/15   Status On-going   PT LONG TERM GOAL #2   Title Patient will demonstrate right ankle strength 4/5 or greater without pain for improved ankle stability   Status Achieved   PT LONG TERM GOAL #3   Title Pt able to drive without noting R ankle pain by 09/16/15   Status  On-going   PT LONG TERM GOAL #4   Title Patient able to return to performing chores, exercise, and recreational activities without limitation by foot/ankle pain by 09/16/15   Status On-going               Plan - 08/18/15 1559    Clinical Impression Statement Pt. tolerated increased resistance with side-stepping well progressing from yellow TB > green TB.  Pt. demo'd only minor LOB with dynamic balance activities and continues to exhibit increased B hip and ankle stability.   R ankle performed WNL in today's treatment.     PT Next Visit Plan R LE nerve glides; ionto to medial ankle; some lumbopelvic stability exercises; ankle ROM and stability exercises   Consulted and Agree with Plan of Care Patient        Problem List Patient Active Problem List   Diagnosis Date Noted  . Idiopathic urticaria 06/02/2015  . Chronic rhinitis 06/02/2015  . 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 Lumbar stenosis 02/11/2015  . Abdominal pain, chronic, right lower quadrant 01/07/2015  . Right hip pain 10/31/2014  . Vitamin D deficiency 10/31/2014  . Osteopenia 10/18/2014  . OSA (obstructive sleep apnea) 07/16/2014  . Migraines 07/16/2014  . TMJ (dislocation of temporomandibular joint) 07/16/2014  . HLD (hyperlipidemia) 03/22/2014  . S/P Nissen fundoplication May 0000000 0000000  . Dysphagia 01/24/2008  . DEPRESSION/ANXIETY 12/02/2007  . ESOPHAGEAL STRICTURE 12/02/2007  . GERD 12/02/2007  . Irritable bowel syndrome 12/02/2007  . Fibromyalgia 12/02/2007  . VASOVAGAL SYNCOPE 12/02/2007    Bess Harvest, PTA 08/18/2015, 4:07 PM  [facility] [address]  [place], [place], [address] Phone: [phone]   Fax:  [phone]  Name: LENIYA FREGOSO MRN: [id number] Date of Birth: Oct 25, 1950

## 2015-08-20 DIAGNOSIS — M4806 Spinal stenosis, lumbar region: Secondary | ICD-10-CM | POA: Diagnosis not present

## 2015-08-20 DIAGNOSIS — R030 Elevated blood-pressure reading, without diagnosis of hypertension: Secondary | ICD-10-CM | POA: Diagnosis not present

## 2015-08-20 DIAGNOSIS — Z6829 Body mass index (BMI) 29.0-29.9, adult: Secondary | ICD-10-CM | POA: Diagnosis not present

## 2015-08-21 ENCOUNTER — Ambulatory Visit: Payer: Medicare Other

## 2015-08-21 DIAGNOSIS — M545 Low back pain, unspecified: Secondary | ICD-10-CM

## 2015-08-21 DIAGNOSIS — M25571 Pain in right ankle and joints of right foot: Secondary | ICD-10-CM | POA: Diagnosis not present

## 2015-08-21 DIAGNOSIS — R262 Difficulty in walking, not elsewhere classified: Secondary | ICD-10-CM | POA: Diagnosis not present

## 2015-08-21 NOTE — Therapy (Signed)
[facility] [address]  [place], [place], [address] Phone: [phone]   Fax:  [phone]  Physical Therapy Treatment  Patient Details  Name: Vanessa Oliver MRN: [id number] Date of Birth: 26-Sep-1950 Referring Provider: Wylene Simmer  Encounter Date: 08/21/2015      PT End of Session - 08/21/15 1531    Visit Number 15   Number of Visits 23   Date for PT Re-Evaluation 09/16/15   PT Start Time [id number]   PT Stop Time 1615   PT Time Calculation (min) 44 min   Activity Tolerance Patient tolerated treatment well   Behavior During Therapy [facility] for tasks assessed/performed      Past Medical History  Diagnosis Date  . Arthritis   . Hyperlipidemia   . Hiatal hernia   . Fibromyalgia   . IBS (irritable bowel syndrome)   . Esophageal stricture   . Plantar fasciitis   . Rosacea   . TMJ (temporomandibular joint disorder)   . Vasovagal syncope   . Allergy     takes Singulair at night  . Fibromyalgia   . Depression     takes CYmbalta daily  . GERD (gastroesophageal reflux disease)     takes Omeprazole daily  . Family history of adverse reaction to anesthesia     oldest brother had trouble with anesthesia a long time ago but can't recall what  . History of bronchitis   . Vasovagal episode back in the 80's  . Weakness     right leg  . Joint pain   . Chronic back pain     stenosis  . Joint swelling   . Hemorrhoids     Past Surgical History  Procedure Laterality Date  . Nissen fundoplication  0000000  . Retinal laser procedure Right 02/01/2011    right eye   . Nasal septum surgery  1970  . Knee arthroscopy Right 1994    x 3  . 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 Carpal tunnel release Bilateral 2004  . Trigger finger release Bilateral 2005  . Abdominal hysterectomy  1997  . Varicose vein surgery Bilateral 2007, 2009    left 2007, right 2009  . Mallet finger Right 2011  . Vitrectomy Right 2012   . Cataract extraction Right 2013    Dr Ellison Hughs  . Carpometacarpal (cmc) fusion of thumb  2015    thumb  . Tonsillectomy  1981  . Nissen fundoplication    . Colonoscopy    . Colonoscopy with esophagogastroduodenoscopy (egd) and esophageal dilation (ed)    . Eye surgery      scar tissue remove  . Lumbar laminectomy/decompression microdiscectomy Right 02/11/2015    Procedure: Laminectomy and Foraminotomy - Right - L3-L4;  Surgeon: Earnie Larsson, MD;  Location: [facility];  Service: Neurosurgery;  Laterality: Right;  Laminectomy and Foraminotomy - Right - L3-L4    There were no vitals filed for this visit.  Visit Diagnosis:  Pain in joint, ankle and foot, right  Bilateral low back pain without sciatica      Subjective Assessment - 08/21/15 1537    Subjective pt. reports a 2/10 pain at the L hip, 2/10 pain the R ankle    Currently in Pain? Yes   Pain Score 2    Pain Location Hip   Pain Orientation Left   Pain Descriptors / Indicators Aching   Pain Type Acute pain   Pain  Radiating Towards n/a   Pain Onset More than a month ago   Pain Frequency Intermittent   Pain Relieving Factors stretching    Multiple Pain Sites Yes   Pain Score 2   Pain Location Ankle   Pain Orientation Right   Pain Descriptors / Indicators Aching   Pain Type Acute pain   Pain Radiating Towards n/a   Aggravating Factors  standing      TODAY'S TREATMENT TherEx - B Stretch HS, Piri, SKTC x 30 sec - Bridge on with heels on peanut p-ball 15x2" - Standing ITB stretch next to ball 30 sec each side  - Standing R calf stretch straight leg x 30 sec  - Rocker DF Ankle Stretch 2x30" - Double leg balance / staggered balance / on blue foam pad x 30 sec each way; 1 UE support on black pole; perturbations added in DLS - trunk rotation x 30 sec each way in hook-lying  - Walking mini lunges; knee not past toe - high knee slow up/down 2 x 20'        PT Long Term Goals - 08/15/15 1003    PT LONG TERM GOAL #1    Title Patient will be independent with advanced HEP as necessary by 09/16/15   Status On-going   PT LONG TERM GOAL #2   Title Patient will demonstrate right ankle strength 4/5 or greater without pain for improved ankle stability   Status Achieved   PT LONG TERM GOAL #3   Title Pt able to drive without noting R ankle pain by 09/16/15   Status On-going   PT LONG TERM GOAL #4   Title Patient able to return to performing chores, exercise, and recreational activities without limitation by foot/ankle pain by 09/16/15   Status On-going           Plan - 08/21/15 1720    Clinical Impression Statement Pt. reported routine soreness after increased activity at home with yellow TB side stepping as part of the HEP.  Pt. instructed to do activity every other day at home.  Hip abduction / squating avoided in todays treatment to allow recovery from HEP overexertion.  Pt. instructed to perform HEP as written in handout.  Focus of today's treatment was static and dynamic strengthening of R ankle to improve stability with functional activity.  Pt. tolerated therex well with no increase in pain.     PT Next Visit Plan R LE nerve glides; ionto to medial ankle; some lumbopelvic stability exercises; ankle ROM and stability exercises   Consulted and Agree with Plan of Care Patient        Problem List Patient Active Problem List   Diagnosis Date Noted  . Idiopathic urticaria 06/02/2015  . Chronic rhinitis 06/02/2015  . 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 Lumbar stenosis 02/11/2015  . Abdominal pain, chronic, right lower quadrant 01/07/2015  . Right hip pain 10/31/2014  . Vitamin D deficiency 10/31/2014  . Osteopenia 10/18/2014  . OSA (obstructive sleep apnea) 07/16/2014  . Migraines 07/16/2014  . TMJ (dislocation of temporomandibular joint) 07/16/2014  . HLD (hyperlipidemia) 03/22/2014  . S/P Nissen fundoplication May 0000000 0000000  . Dysphagia 01/24/2008  .  DEPRESSION/ANXIETY 12/02/2007  . ESOPHAGEAL STRICTURE 12/02/2007  . GERD 12/02/2007  . Irritable bowel syndrome 12/02/2007  . Fibromyalgia 12/02/2007  . VASOVAGAL SYNCOPE 12/02/2007    Bess Harvest, PTA 08/21/2015, 5:28 PM  [facility] [address]  [place], [place], [address] Phone: [phone]   Fax:  [phone]  Name: Vanessa Oliver MRN: [id number] Date of Birth: 1951/06/22

## 2015-08-25 ENCOUNTER — Ambulatory Visit: Payer: Medicare Other

## 2015-08-25 DIAGNOSIS — R262 Difficulty in walking, not elsewhere classified: Secondary | ICD-10-CM | POA: Diagnosis not present

## 2015-08-25 DIAGNOSIS — M545 Low back pain, unspecified: Secondary | ICD-10-CM

## 2015-08-25 DIAGNOSIS — M25571 Pain in right ankle and joints of right foot: Secondary | ICD-10-CM

## 2015-08-25 NOTE — Therapy (Signed)
[facility] [address]  [place], [place], [address] Phone: [phone]   Fax:  [phone]  Physical Therapy Treatment  Patient Details  Name: Vanessa Oliver MRN: [id number] Date of Birth: Feb 05, 1951 Referring Provider: Wylene Simmer  Encounter Date: 08/25/2015      PT End of Session - 08/25/15 1309    Visit Number 16   Number of Visits 23   Date for PT Re-Evaluation 09/16/15   PT Start Time 1309   PT Stop Time 1357   PT Time Calculation (min) 48 min   Activity Tolerance Patient tolerated treatment well   Behavior During Therapy [facility] for tasks assessed/performed      Past Medical History  Diagnosis Date  . Arthritis   . Hyperlipidemia   . Hiatal hernia   . Fibromyalgia   . IBS (irritable bowel syndrome)   . Esophageal stricture   . Plantar fasciitis   . Rosacea   . TMJ (temporomandibular joint disorder)   . Vasovagal syncope   . Allergy     takes Singulair at night  . Fibromyalgia   . Depression     takes CYmbalta daily  . GERD (gastroesophageal reflux disease)     takes Omeprazole daily  . Family history of adverse reaction to anesthesia     oldest brother had trouble with anesthesia a long time ago but can't recall what  . History of bronchitis   . Vasovagal episode back in the 80's  . Weakness     right leg  . Joint pain   . Chronic back pain     stenosis  . Joint swelling   . Hemorrhoids     Past Surgical History  Procedure Laterality Date  . Nissen fundoplication  0000000  . Retinal laser procedure Right 02/01/2011    right eye   . Nasal septum surgery  1970  . Knee arthroscopy Right 1994    x 3  . 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 Carpal tunnel release Bilateral 2004  . Trigger finger release Bilateral 2005  . Abdominal hysterectomy  1997  . Varicose vein surgery Bilateral 2007, 2009    left 2007, right 2009  . Mallet finger Right 2011  . Vitrectomy Right  2012  . Cataract extraction Right 2013    Dr Ellison Hughs  . Carpometacarpal (cmc) fusion of thumb  2015    thumb  . Tonsillectomy  1981  . Nissen fundoplication    . Colonoscopy    . Colonoscopy with esophagogastroduodenoscopy (egd) and esophageal dilation (ed)    . Eye surgery      scar tissue remove  . Lumbar laminectomy/decompression microdiscectomy Right 02/11/2015    Procedure: Laminectomy and Foraminotomy - Right - L3-L4;  Surgeon: Earnie Larsson, MD;  Location: [facility];  Service: Neurosurgery;  Laterality: Right;  Laminectomy and Foraminotomy - Right - L3-L4    There were no vitals filed for this visit.  Visit Diagnosis:  Pain in joint, ankle and foot, right  Bilateral low back pain without sciatica      Subjective Assessment - 08/25/15 1314    Subjective No pain or complaints reported.     Patient Stated Goals "No more pain in the right foot"   Currently in Pain? No/denies   Pain Score 0-No pain   Multiple Pain Sites No      TODAY'S TREATMENT TherEx - Stretch L HS, Piri, SKTC x 30  sec - Bridge on with heels on peanut p-ball 15x2" - Rocker DF Ankle Stretch 2x30" - double leg narrow stance x 30 sec with perterbations; no UE support  - single leg stance x 30 each side; 1 UE touch-down  - high knee 2 x 20' next to counter; 1 UE support  - forward alternating high-knee marching over cones 6 x 6 cones  - side stepping high-knee step over 6 x 6 cones    Ionto 4-6 Patch #3/6 with 80 mA.min dex applied to medial R ankle - pt advised to remove between 6-8 PM           PT Long Term Goals - 08/15/15 1003    PT LONG TERM GOAL #1   Title Patient will be independent with advanced HEP as necessary by 09/16/15   Status On-going   PT LONG TERM GOAL #2   Title Patient will demonstrate right ankle strength 4/5 or greater without pain for improved ankle stability   Status Achieved   PT LONG TERM GOAL #3   Title Pt able to drive without noting R ankle pain by 09/16/15   Status  On-going   PT LONG TERM GOAL #4   Title Patient able to return to performing chores, exercise, and recreational activities without limitation by foot/ankle pain by 09/16/15   Status On-going               Plan - 08/25/15 1508    Clinical Impression Statement Pt. reported feeling good in today's session with no pain initialy.  Pt. progressed to higher level ankle stability activities in todays treament with high knee marching over cones forward / side.  Pt. continues to be highly motivated and reports adherence to HEP daily.  Pt. demo'd minimal R ankle instability in today's treatment and tolerated all therex well with no report of pain.      PT Next Visit Plan R LE nerve glides; ionto to medial ankle; some lumbopelvic stability exercises; ankle ROM and stability exercises   Consulted and Agree with Plan of Care Patient        Problem List Patient Active Problem List   Diagnosis Date Noted  . Idiopathic urticaria 06/02/2015  . Chronic rhinitis 06/02/2015  . 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 Lumbar stenosis 02/11/2015  . Abdominal pain, chronic, right lower quadrant 01/07/2015  . Right hip pain 10/31/2014  . Vitamin D deficiency 10/31/2014  . Osteopenia 10/18/2014  . OSA (obstructive sleep apnea) 07/16/2014  . Migraines 07/16/2014  . TMJ (dislocation of temporomandibular joint) 07/16/2014  . HLD (hyperlipidemia) 03/22/2014  . S/P Nissen fundoplication May 0000000 0000000  . Dysphagia 01/24/2008  . DEPRESSION/ANXIETY 12/02/2007  . ESOPHAGEAL STRICTURE 12/02/2007  . GERD 12/02/2007  . Irritable bowel syndrome 12/02/2007  . Fibromyalgia 12/02/2007  . VASOVAGAL SYNCOPE 12/02/2007    Bess Harvest, PTA 08/25/2015, 3:21 PM  [facility] [address]  [place], [place], [address] Phone: [phone]   Fax:  [phone]  Name: Vanessa Oliver MRN: [id number] Date of Birth:  02-23-1951

## 2015-08-28 ENCOUNTER — Ambulatory Visit: Payer: Medicare Other | Admitting: Physical Therapy

## 2015-08-28 DIAGNOSIS — R262 Difficulty in walking, not elsewhere classified: Secondary | ICD-10-CM

## 2015-08-28 DIAGNOSIS — M25571 Pain in right ankle and joints of right foot: Secondary | ICD-10-CM

## 2015-08-28 DIAGNOSIS — M545 Low back pain, unspecified: Secondary | ICD-10-CM

## 2015-08-28 NOTE — Therapy (Signed)
[facility] [address]  [place], [place], [address] Phone: [phone]   Fax:  [phone]  Physical Therapy Treatment  Patient Details  Name: Vanessa Oliver MRN: [id number] Date of Birth: 1951/03/09 Referring Provider: Wylene Simmer  Encounter Date: 08/28/2015      Oliver End of Session - 08/28/15 1320    Visit Number 17   Number of Visits 23   Date for Oliver Re-Evaluation 09/16/15   Oliver Start Time [id number]   Oliver Stop Time 1359   Oliver Time Calculation (min) 42 min      Past Medical History  Diagnosis Date  . Arthritis   . Hyperlipidemia   . Hiatal hernia   . Fibromyalgia   . IBS (irritable bowel syndrome)   . Esophageal stricture   . Plantar fasciitis   . Rosacea   . TMJ (temporomandibular joint disorder)   . Vasovagal syncope   . Allergy     takes Singulair at night  . Fibromyalgia   . Depression     takes CYmbalta daily  . GERD (gastroesophageal reflux disease)     takes Omeprazole daily  . Family history of adverse reaction to anesthesia     oldest brother had trouble with anesthesia a long time ago but can't recall what  . History of bronchitis   . Vasovagal episode back in the 80's  . Weakness     right leg  . Joint pain   . Chronic back pain     stenosis  . Joint swelling   . Hemorrhoids     Past Surgical History  Procedure Laterality Date  . Nissen fundoplication  0000000  . Retinal laser procedure Right 02/01/2011    right eye   . Nasal septum surgery  1970  . Knee arthroscopy Right 1994    x 3  . 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 Carpal tunnel release Bilateral 2004  . Trigger finger release Bilateral 2005  . Abdominal hysterectomy  1997  . Varicose vein surgery Bilateral 2007, 2009    left 2007, right 2009  . Mallet finger Right 2011  . Vitrectomy Right 2012  . Cataract extraction Right 2013    Dr Ellison Hughs  . Carpometacarpal (cmc) fusion of thumb  2015    thumb  .  Tonsillectomy  1981  . Nissen fundoplication    . Colonoscopy    . Colonoscopy with esophagogastroduodenoscopy (egd) and esophageal dilation (ed)    . Eye surgery      scar tissue remove  . Lumbar laminectomy/decompression microdiscectomy Right 02/11/2015    Procedure: Laminectomy and Foraminotomy - Right - L3-L4;  Surgeon: Earnie Larsson, MD;  Location: [facility];  Service: Neurosurgery;  Laterality: Right;  Laminectomy and Foraminotomy - Right - L3-L4    There were no vitals filed for this visit.  Visit Diagnosis:  Pain in joint, ankle and foot, right  Bilateral low back pain without sciatica  Difficulty walking      Subjective Assessment - 08/28/15 1322    Subjective States has been feeling better stating foot has been doing really good.  States notes mild LBP today rating 2/10.   Currently in Pain? Yes   Pain Score 2    Pain Location Back   Pain Orientation Left;Lower       TODAY'S TREATMENT TherEx - NuStep lvl 4, 4' Bridge 10x5" Stretch B HS, Piri, SKTC, B LE Nerve Glides  Side-Lying Clam Blue Tb 15x3" each DF Rocker Stretch 3x20" Fitter BW Lunge 1 Black + 1 Blue 15x each with B Pole A Foam Beam tandem gait FW/BW 2 laps Foam Beam side-stepping 2 laps then 2 more laps with Yellow TB at ankles DL Heel Raise at UBE 20x Squat at edge of plinth 8x (attempting to only lightly tap but sat each rep) MiniTramp March 12x each with SBA MiniTramp March on forefoot 10x each with B HHA Knee Flexion Machine 20# 15x           Oliver Long Term Goals - 08/28/15 1333    Oliver LONG TERM GOAL #3   Title Oliver able to drive without noting R ankle pain by 09/16/15  states hasn't bothered her lately   Status Achieved               Plan - 08/28/15 1359    Clinical Impression Statement Oliver seems to be progressing very well lately with regard to pain and function.  Ankle pain is infrequent and only mild LBP noted today.  Exercises well tolerated and is nearing all goals.   Oliver Next  Visit Plan R LE nerve glides; ionto to medial ankle PRN; lumbopelvic stability exercises; ankle stability exercises and proprioception training progressions   Consulted and Agree with Plan of Care Patient        Problem List Patient Active Problem List   Diagnosis Date Noted  . Idiopathic urticaria 06/02/2015  . Chronic rhinitis 06/02/2015  . 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 Lumbar stenosis 02/11/2015  . Abdominal pain, chronic, right lower quadrant 01/07/2015  . Right hip pain 10/31/2014  . Vitamin D deficiency 10/31/2014  . Osteopenia 10/18/2014  . OSA (obstructive sleep apnea) 07/16/2014  . Migraines 07/16/2014  . TMJ (dislocation of temporomandibular joint) 07/16/2014  . HLD (hyperlipidemia) 03/22/2014  . S/P Nissen fundoplication May 0000000 0000000  . Dysphagia 01/24/2008  . DEPRESSION/ANXIETY 12/02/2007  . ESOPHAGEAL STRICTURE 12/02/2007  . GERD 12/02/2007  . Irritable bowel syndrome 12/02/2007  . Fibromyalgia 12/02/2007  . VASOVAGAL SYNCOPE 12/02/2007    Vanessa Oliver, OCS 08/28/2015, 2:01 PM  [facility] [address]  [place], [place], [address] Phone: [phone]   Fax:  [phone]  Name: Vanessa Oliver MRN: [id number] Date of Birth: 1951/06/27

## 2015-09-01 ENCOUNTER — Ambulatory Visit: Payer: Medicare Other | Admitting: Physical Therapy

## 2015-09-01 DIAGNOSIS — M545 Low back pain, unspecified: Secondary | ICD-10-CM

## 2015-09-01 DIAGNOSIS — R262 Difficulty in walking, not elsewhere classified: Secondary | ICD-10-CM | POA: Diagnosis not present

## 2015-09-01 DIAGNOSIS — M25571 Pain in right ankle and joints of right foot: Secondary | ICD-10-CM

## 2015-09-01 NOTE — Therapy (Signed)
[facility] [address]  [place], [place], [address] Phone: [phone]   Fax:  [phone]  Physical Therapy Treatment  Patient Details  Name: Vanessa Oliver MRN: [id number] Date of Birth: May 24, 1951 Referring Provider: Wylene Simmer  Encounter Date: 09/01/2015      PT End of Session - 09/01/15 1319    Visit Number 18   Number of Visits 23   Date for PT Re-Evaluation 09/16/15   PT Start Time 1318   PT Stop Time 1402   PT Time Calculation (min) 44 min      Past Medical History  Diagnosis Date  . Arthritis   . Hyperlipidemia   . Hiatal hernia   . Fibromyalgia   . IBS (irritable bowel syndrome)   . Esophageal stricture   . Plantar fasciitis   . Rosacea   . TMJ (temporomandibular joint disorder)   . Vasovagal syncope   . Allergy     takes Singulair at night  . Fibromyalgia   . Depression     takes CYmbalta daily  . GERD (gastroesophageal reflux disease)     takes Omeprazole daily  . Family history of adverse reaction to anesthesia     oldest brother had trouble with anesthesia a long time ago but can't recall what  . History of bronchitis   . Vasovagal episode back in the 80's  . Weakness     right leg  . Joint pain   . Chronic back pain     stenosis  . Joint swelling   . Hemorrhoids     Past Surgical History  Procedure Laterality Date  . Nissen fundoplication  0000000  . Retinal laser procedure Right 02/01/2011    right eye   . Nasal septum surgery  1970  . Knee arthroscopy Right 1994    x 3  . 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 Carpal tunnel release Bilateral 2004  . Trigger finger release Bilateral 2005  . Abdominal hysterectomy  1997  . Varicose vein surgery Bilateral 2007, 2009    left 2007, right 2009  . Mallet finger Right 2011  . Vitrectomy Right 2012  . Cataract extraction Right 2013    Dr Ellison Hughs  . Carpometacarpal (cmc) fusion of thumb  2015    thumb  .  Tonsillectomy  1981  . Nissen fundoplication    . Colonoscopy    . Colonoscopy with esophagogastroduodenoscopy (egd) and esophageal dilation (ed)    . Eye surgery      scar tissue remove  . Lumbar laminectomy/decompression microdiscectomy Right 02/11/2015    Procedure: Laminectomy and Foraminotomy - Right - L3-L4;  Surgeon: Earnie Larsson, MD;  Location: [facility];  Service: Neurosurgery;  Laterality: Right;  Laminectomy and Foraminotomy - Right - L3-L4    There were no vitals filed for this visit.  Visit Diagnosis:  Pain in joint, ankle and foot, right  Bilateral low back pain without sciatica  Difficulty walking      Subjective Assessment - 09/01/15 1319    Subjective States noted some increased soreness in lower back and R knee after last treatment but this has since improved.  States Quadruped exercise portion of HEP seems to bother back of R knee when extending R LE.   Currently in Pain? Yes   Pain Score 2    Pain Location Back   Pain Orientation Left;Lower   Pain Score 0   Pain  Location Ankle          TODAY'S TREATMENT TherEx - Bridge on Heels 15x Stretch B HS, Piri, SKTC, B LE Nerve Glides Supine ALT SLR 10x each Side-Lying Hip ABD 10x each Side-Lying Clam Blue Tb 10x3" each Foam Pad Tandem Standing 30" each with infrequent B Pole Assist DF Rocker Stretch 2x20" Foam Pad SLS 2x15" each with infrequent B Pole assist Free Squat 4# db counterbalance 10x BOSU (down) MiniSquat 10x BOSU (down) Narrow Standig 30" with close SBA Knee Flexion Machine 20# 2x15 TRX Low Row 15x TRX Lateral Lunge 10x each  Ionto 4-6 Patch #4/6 with 80 mA.min dex applied to medial R ankle - pt advised to remove between 6-8 PM per pt request due to mild soreness and TTP medial ankle                        PT Long Term Goals - 08/28/15 1333    PT LONG TERM GOAL #3   Title Pt able to drive without noting R ankle pain by 09/16/15  states hasn't bothered her lately   Status  Achieved               Plan - 09/01/15 1403    Clinical Impression Statement some increased soreness in lower back and R LE after last treatment and states notes pain with quadruplex exercise.  Avoided this exercise but otherwise performed very well today with balance/proprioception training.  Mild ankle soreness with treatment so ionto to R ankle per pt request.   PT Next Visit Plan R LE nerve glides; ionto to medial ankle PRN; lumbopelvic stability exercises; ankle stability exercises and proprioception training progressions   Consulted and Agree with Plan of Care Patient        Problem List Patient Active Problem List   Diagnosis Date Noted  . Idiopathic urticaria 06/02/2015  . Chronic rhinitis 06/02/2015  . 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 Lumbar stenosis 02/11/2015  . Abdominal pain, chronic, right lower quadrant 01/07/2015  . Right hip pain 10/31/2014  . Vitamin D deficiency 10/31/2014  . Osteopenia 10/18/2014  . OSA (obstructive sleep apnea) 07/16/2014  . Migraines 07/16/2014  . TMJ (dislocation of temporomandibular joint) 07/16/2014  . HLD (hyperlipidemia) 03/22/2014  . S/P Nissen fundoplication May 0000000 0000000  . Dysphagia 01/24/2008  . DEPRESSION/ANXIETY 12/02/2007  . ESOPHAGEAL STRICTURE 12/02/2007  . GERD 12/02/2007  . Irritable bowel syndrome 12/02/2007  . Fibromyalgia 12/02/2007  . VASOVAGAL SYNCOPE 12/02/2007    Kriston Mckinnie PT, OCS 09/01/2015, 2:05 PM  [facility] [address]  [place], [place], [address] Phone: [phone]   Fax:  [phone]  Name: DARIONNA SELVERA MRN: [id number] Date of Birth: 04-Mar-1951

## 2015-09-03 ENCOUNTER — Ambulatory Visit: Payer: Medicare Other

## 2015-09-03 DIAGNOSIS — M25571 Pain in right ankle and joints of right foot: Secondary | ICD-10-CM | POA: Diagnosis not present

## 2015-09-03 DIAGNOSIS — R262 Difficulty in walking, not elsewhere classified: Secondary | ICD-10-CM | POA: Diagnosis not present

## 2015-09-03 DIAGNOSIS — M545 Low back pain, unspecified: Secondary | ICD-10-CM

## 2015-09-03 NOTE — Therapy (Signed)
[facility] [address]  [place], [place], [address] Phone: [phone]   Fax:  [phone]  Physical Therapy Treatment  Patient Details  Name: Vanessa Oliver MRN: [id number] Date of Birth: 1951/07/03 Referring Provider: Wylene Simmer  Encounter Date: 09/03/2015      PT End of Session - 09/03/15 1446    Visit Number 19   Number of Visits 23   Date for PT Re-Evaluation 09/16/15   PT Start Time [id number]   PT Stop Time 1532   PT Time Calculation (min) 45 min   Activity Tolerance Patient tolerated treatment well   Behavior During Therapy [facility] for tasks assessed/performed      Past Medical History  Diagnosis Date  . Arthritis   . Hyperlipidemia   . Hiatal hernia   . Fibromyalgia   . IBS (irritable bowel syndrome)   . Esophageal stricture   . Plantar fasciitis   . Rosacea   . TMJ (temporomandibular joint disorder)   . Vasovagal syncope   . Allergy     takes Singulair at night  . Fibromyalgia   . Depression     takes CYmbalta daily  . GERD (gastroesophageal reflux disease)     takes Omeprazole daily  . Family history of adverse reaction to anesthesia     oldest brother had trouble with anesthesia a long time ago but can't recall what  . History of bronchitis   . Vasovagal episode back in the 80's  . Weakness     right leg  . Joint pain   . Chronic back pain     stenosis  . Joint swelling   . Hemorrhoids     Past Surgical History  Procedure Laterality Date  . Nissen fundoplication  0000000  . Retinal laser procedure Right 02/01/2011    right eye   . Nasal septum surgery  1970  . Knee arthroscopy Right 1994    x 3  . 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 Carpal tunnel release Bilateral 2004  . Trigger finger release Bilateral 2005  . Abdominal hysterectomy  1997  . Varicose vein surgery Bilateral 2007, 2009    left 2007, right 2009  . Mallet finger Right 2011  . Vitrectomy Right  2012  . Cataract extraction Right 2013    Dr Ellison Hughs  . Carpometacarpal (cmc) fusion of thumb  2015    thumb  . Tonsillectomy  1981  . Nissen fundoplication    . Colonoscopy    . Colonoscopy with esophagogastroduodenoscopy (egd) and esophageal dilation (ed)    . Eye surgery      scar tissue remove  . Lumbar laminectomy/decompression microdiscectomy Right 02/11/2015    Procedure: Laminectomy and Foraminotomy - Right - L3-L4;  Surgeon: Earnie Larsson, MD;  Location: [place] [facility];  Service: Neurosurgery;  Laterality: Right;  Laminectomy and Foraminotomy - Right - L3-L4    There were no vitals filed for this visit.  Visit Diagnosis:  Pain in joint, ankle and foot, right  Bilateral low back pain without sciatica      Subjective Assessment - 09/03/15 1545    Subjective Pt. reports mild soreness in bilateral shoulders around shoulder blades however unable to assign number to soreness.  No other pain or complaints reported.     Currently in Pain? No/denies   Pain Score 0-No pain   Multiple Pain Sites No      TODAY'S TREATMENT TherEx Bridge  on Heels 15x Supine ALT SLR 10x each Bridge with isometric / ER 3 x 5 each  Side-Lying Hip ABD 10x each with blue band  Side stepping with green TB 20' x 4  Monster walk with green TB 20' x 3  Foam beam side stepping down / back x 3 laps; no UE support Foam beam toe side-stepping down / back x 3 laps; with frequent UE touch-down on black pole  Foam beam tandem walking down / back forward x 3 laps; 1 UE on black pole   Foam Pad SLS 2x15" each with infrequent R UE Pole assist BOSU (down) MiniSquat 10x  BOSU (down) Narrow Standig 30" with close SBA Stretch B HS, Piri, SKTC  * No ankle pain following therex,no Ionto application         PT Long Term Goals - 09/03/15 1540    PT LONG TERM GOAL #1   Title Patient will be independent with advanced HEP as necessary by 09/16/15   Status On-going   PT LONG TERM GOAL #2   Title Patient will demonstrate  right ankle strength 4/5 or greater without pain for improved ankle stability   Status On-going   PT LONG TERM GOAL #3   Title Pt able to drive without noting R ankle pain by 09/16/15  states hasn't bothered her lately   Status Achieved   PT LONG TERM GOAL #4   Title Patient able to return to performing chores, exercise, and recreational activities without limitation by foot/ankle pain by 09/16/15   Status On-going          Plan - 09/03/15 1548    Clinical Impression Statement Pt. continues to improve bilateral hip / knee / ankle strength tolerating high level single leg balance activities well with no pain.  Pt. improving toward symmetrical stability at bilateral ankles with balance activities.  Pt. reports bilateral sciatica no longer an issue.  Pt. reports daily adherence to side-stepping HEP with yellow TB and continues to be compliant with therapy and motivated with HEP.     PT Next Visit Plan R LE nerve glides; ionto to medial ankle PRN; lumbopelvic stability exercises; ankle stability exercises and proprioception training progressions   Consulted and Agree with Plan of Care Patient     Problem List Patient Active Problem List   Diagnosis Date Noted  . Idiopathic urticaria 06/02/2015  . Chronic rhinitis 06/02/2015  . 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 Lumbar stenosis 02/11/2015  . Abdominal pain, chronic, right lower quadrant 01/07/2015  . Right hip pain 10/31/2014  . Vitamin D deficiency 10/31/2014  . Osteopenia 10/18/2014  . OSA (obstructive sleep apnea) 07/16/2014  . Migraines 07/16/2014  . TMJ (dislocation of temporomandibular joint) 07/16/2014  . HLD (hyperlipidemia) 03/22/2014  . S/P Nissen fundoplication May 0000000 0000000  . Dysphagia 01/24/2008  . DEPRESSION/ANXIETY 12/02/2007  . ESOPHAGEAL STRICTURE 12/02/2007  . GERD 12/02/2007  . Irritable bowel syndrome 12/02/2007  . Fibromyalgia 12/02/2007  . VASOVAGAL SYNCOPE  12/02/2007    Bess Harvest, PTA 09/03/2015, 4:02 PM  [facility] [address]  [place], [place], [address] Phone: [phone]   Fax:  [phone]  Name: Vanessa Oliver MRN: [id number] Date of Birth: [birth year]

## 2015-09-04 ENCOUNTER — Ambulatory Visit: Payer: Medicare Other

## 2015-09-08 ENCOUNTER — Ambulatory Visit: Payer: Medicare Other | Admitting: Physical Therapy

## 2015-09-08 DIAGNOSIS — M545 Low back pain, unspecified: Secondary | ICD-10-CM

## 2015-09-08 DIAGNOSIS — R262 Difficulty in walking, not elsewhere classified: Secondary | ICD-10-CM | POA: Diagnosis not present

## 2015-09-08 DIAGNOSIS — M25571 Pain in right ankle and joints of right foot: Secondary | ICD-10-CM | POA: Diagnosis not present

## 2015-09-08 NOTE — Therapy (Addendum)
[facility] [address]  [place], [place], [address] Phone: [phone]   Fax:  [phone]  Physical Therapy Treatment  Patient Details  Name: Vanessa Oliver MRN: [id number] Date of Birth: 27-Jan-1951 Referring Provider: Wylene Simmer  Encounter Date: 09/08/2015      PT End of Session - 09/08/15 1420    Visit Number 20   Number of Visits 23   Date for PT Re-Evaluation 09/16/15   PT Start Time 1419   PT Stop Time 1454   PT Time Calculation (min) 35 min      Past Medical History  Diagnosis Date  . Arthritis   . Hyperlipidemia   . Hiatal hernia   . Fibromyalgia   . IBS (irritable bowel syndrome)   . Esophageal stricture   . Plantar fasciitis   . Rosacea   . TMJ (temporomandibular joint disorder)   . Vasovagal syncope   . Allergy     takes Singulair at night  . Fibromyalgia   . Depression     takes CYmbalta daily  . GERD (gastroesophageal reflux disease)     takes Omeprazole daily  . Family history of adverse reaction to anesthesia     oldest brother had trouble with anesthesia a long time ago but can't recall what  . History of bronchitis   . Vasovagal episode back in the 80's  . Weakness     right leg  . Joint pain   . Chronic back pain     stenosis  . Joint swelling   . Hemorrhoids     Past Surgical History  Procedure Laterality Date  . Nissen fundoplication  0000000  . Retinal laser procedure Right 02/01/2011    right eye   . Nasal septum surgery  1970  . Knee arthroscopy Right 1994    x 3  . 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 Carpal tunnel release Bilateral 2004  . Trigger finger release Bilateral 2005  . Abdominal hysterectomy  1997  . Varicose vein surgery Bilateral 2007, 2009    left 2007, right 2009  . Mallet finger Right 2011  . Vitrectomy Right 2012  . Cataract extraction Right 2013    Dr Ellison Hughs  . Carpometacarpal (cmc) fusion of thumb  2015    thumb  .  Tonsillectomy  1981  . Nissen fundoplication    . Colonoscopy    . Colonoscopy with esophagogastroduodenoscopy (egd) and esophageal dilation (ed)    . Eye surgery      scar tissue remove  . Lumbar laminectomy/decompression microdiscectomy Right 02/11/2015    Procedure: Laminectomy and Foraminotomy - Right - L3-L4;  Surgeon: Earnie Larsson, MD;  Location: [facility];  Service: Neurosurgery;  Laterality: Right;  Laminectomy and Foraminotomy - Right - L3-L4    There were no vitals filed for this visit.  Visit Diagnosis:  Pain in joint, ankle and foot, right  Bilateral low back pain without sciatica      Subjective Assessment - 09/08/15 1421    Subjective States cleaned a great deal over the weekend and was "sore everywhere" after this but state all this soreness resolved and no pain today.   Currently in Pain? No/denies       TODAY'S TREATMENT TherEx - NuStep LVL 4, 3' Bridge on Heels 15x Stretch B HS, Piri, SKTC, B LE Nerve Glides Quadruped UE/LE 10x each Fitter Hip Extension 2 Blue 15x each with B  Pole A TRX DL Squat with Black TB at knees 15x3" TRX SL Squat 6x each SL Golfer's lift 2# 2x5 each SL Low Row Red TB 10x each                [facility] PT Assessment - 2015/09/16 1400    Observation/Other Assessments   Focus on Therapeutic Outcomes (FOTO)  17% limitation                             PT Education - 16-Sep-2015 1634    Education provided Yes   Education Details HEP update   Person(s) Educated Patient   Methods Demonstration;Explanation;Handout   Comprehension Verbalized understanding;Returned demonstration             PT Long Term Goals - 09/03/15 1540    PT LONG TERM GOAL #1   Title Patient will be independent with advanced HEP as necessary by 09/16/15   Status On-going   PT LONG TERM GOAL #2   Title Patient will demonstrate right ankle strength 4/5 or greater without pain for improved ankle stability   Status On-going   PT LONG  TERM GOAL #3   Title Pt able to drive without noting R ankle pain by 09/16/15  states hasn't bothered her lately   Status Achieved   PT LONG TERM GOAL #4   Title Patient able to return to performing chores, exercise, and recreational activities without limitation by foot/ankle pain by 09/16/15   Status On-going               Plan - 09/16/2015 1631    Clinical Impression Statement Good progress.  Active over the weekend noting soreness "all over" but denies increased foot pain or lasting lower back pain/soreness.  Is pain-free today and performed all exercises without pain.  Progression to HEP and seems should be ready for d/c soon.   PT Next Visit Plan R LE nerve glides; ionto to medial ankle PRN; lumbopelvic stability exercises; ankle stability exercises and proprioception training progressions   Consulted and Agree with Plan of Care Patient          G-Codes - 09-16-15 1305    Functional Assessment Tool Used foto 17% limitation   Functional Limitation Mobility: Walking and moving around   Mobility: Walking and Moving Around Current Status [phone]) At least 1 percent but less than 20 percent impaired, limited or restricted   Mobility: Walking and Moving Around Goal Status [phone]) At least 20 percent but less than 40 percent impaired, limited or restricted      Problem List Patient Active Problem List   Diagnosis Date Noted  . Idiopathic urticaria 06/02/2015  . Chronic rhinitis 06/02/2015  . 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 Lumbar stenosis 02/11/2015  . Abdominal pain, chronic, right lower quadrant 01/07/2015  . Right hip pain 10/31/2014  . Vitamin D deficiency 10/31/2014  . Osteopenia 10/18/2014  . OSA (obstructive sleep apnea) 07/16/2014  . Migraines 07/16/2014  . TMJ (dislocation of temporomandibular joint) 07/16/2014  . HLD (hyperlipidemia) 03/22/2014  . S/P Nissen fundoplication May 0000000 0000000  . Dysphagia 01/24/2008  .  DEPRESSION/ANXIETY 12/02/2007  . ESOPHAGEAL STRICTURE 12/02/2007  . GERD 12/02/2007  . Irritable bowel syndrome 12/02/2007  . Fibromyalgia 12/02/2007  . VASOVAGAL SYNCOPE 12/02/2007    Hallis Meditz PT, OCS 09/09/2015, 1:06 PM  [facility] [address]  [address] [place],  [place], [address] Phone: [phone]   Fax:  [phone]  Name: Vanessa Oliver MRN: [id number] Date of Birth: [birth year]

## 2015-09-11 ENCOUNTER — Ambulatory Visit: Payer: Medicare Other | Attending: Orthopedic Surgery | Admitting: Physical Therapy

## 2015-09-11 DIAGNOSIS — R262 Difficulty in walking, not elsewhere classified: Secondary | ICD-10-CM | POA: Insufficient documentation

## 2015-09-11 DIAGNOSIS — M25571 Pain in right ankle and joints of right foot: Secondary | ICD-10-CM | POA: Diagnosis not present

## 2015-09-11 DIAGNOSIS — M545 Low back pain, unspecified: Secondary | ICD-10-CM

## 2015-09-11 NOTE — Therapy (Signed)
[facility] [address]  [place], [place], [address] Phone: [phone]   Fax:  [phone]  Physical Therapy Treatment  Patient Details  Name: Vanessa Oliver MRN: [id number] Date of Birth: May 12, 1951 Referring Provider: Wylene Simmer  Encounter Date: 09/11/2015      PT End of Session - 09/11/15 1320    Visit Number 21   Number of Visits 23   Date for PT Re-Evaluation 09/16/15   PT Start Time 1318   PT Stop Time 1401   PT Time Calculation (min) 43 min      Past Medical History  Diagnosis Date  . Arthritis   . Hyperlipidemia   . Hiatal hernia   . Fibromyalgia   . IBS (irritable bowel syndrome)   . Esophageal stricture   . Plantar fasciitis   . Rosacea   . TMJ (temporomandibular joint disorder)   . Vasovagal syncope   . Allergy     takes Singulair at night  . Fibromyalgia   . Depression     takes CYmbalta daily  . GERD (gastroesophageal reflux disease)     takes Omeprazole daily  . Family history of adverse reaction to anesthesia     oldest brother had trouble with anesthesia a long time ago but can't recall what  . History of bronchitis   . Vasovagal episode back in the 80's  . Weakness     right leg  . Joint pain   . Chronic back pain     stenosis  . Joint swelling   . Hemorrhoids     Past Surgical History  Procedure Laterality Date  . Nissen fundoplication  0000000  . Retinal laser procedure Right 02/01/2011    right eye   . Nasal septum surgery  1970  . Knee arthroscopy Right 1994    x 3  . 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 Carpal tunnel release Bilateral 2004  . Trigger finger release Bilateral 2005  . Abdominal hysterectomy  1997  . Varicose vein surgery Bilateral 2007, 2009    left 2007, right 2009  . Mallet finger Right 2011  . Vitrectomy Right 2012  . Cataract extraction Right 2013    Dr Ellison Hughs  . Carpometacarpal (cmc) fusion of thumb  2015    thumb  .  Tonsillectomy  1981  . Nissen fundoplication    . Colonoscopy    . Colonoscopy with esophagogastroduodenoscopy (egd) and esophageal dilation (ed)    . Eye surgery      scar tissue remove  . Lumbar laminectomy/decompression microdiscectomy Right 02/11/2015    Procedure: Laminectomy and Foraminotomy - Right - L3-L4;  Surgeon: Earnie Larsson, MD;  Location: [place] [facility];  Service: Neurosurgery;  Laterality: Right;  Laminectomy and Foraminotomy - Right - L3-L4    There were no vitals filed for this visit.  Visit Diagnosis:  Pain in joint, ankle and foot, right  Bilateral low back pain without sciatica      Subjective Assessment - 09/11/15 1321    Subjective States noted sharp increase in R lower back pain upon waking this AM.   Currently in Pain? Yes   Pain Score 5    Pain Location Back   Pain Orientation Right;Lower   Pain Score 1   Pain Location Ankle   Pain Orientation Right           TODAY'S TREATMENT TherEx - Stretch R HS, Piriformis (KTOS and  FABER), Prone knee flexion, Prone Hip IR, Prone Knee flexion (quite tight), prone mod thomas RF and hip flexor stretch  Manual - STM to R lateral buttock mms in prone L Side-Lying strumming R ITB  Ionto 4-6 Patch #5/6 with 80 mA.min dex applied to medial R ankle -  per pt request due to mild soreness and TTP medial ankle, pt advised to remove between 6-8 PM          PT Education - 09/11/15 1401    Education provided Yes   Education Details HEP update   Person(s) Educated Patient   Methods Explanation;Demonstration;Handout   Comprehension Verbalized understanding;Returned demonstration          PT Short Term Goals - 04/21/15 0906    PT SHORT TERM GOAL #1   Title pt independent with initial HEP by 03/28/15   Status Achieved           PT Long Term Goals - 09/03/15 1540    PT LONG TERM GOAL #1   Title Patient will be independent with advanced HEP as necessary by 09/16/15   Status On-going   PT LONG TERM GOAL #2    Title Patient will demonstrate right ankle strength 4/5 or greater without pain for improved ankle stability   Status On-going   PT LONG TERM GOAL #3   Title Pt able to drive without noting R ankle pain by 09/16/15  states hasn't bothered her lately   Status Achieved   PT LONG TERM GOAL #4   Title Patient able to return to performing chores, exercise, and recreational activities without limitation by foot/ankle pain by 09/16/15   Status On-going               Plan - 09/11/15 1403    Clinical Impression Statement pt with soreness in R hip today which seems overuse tendon pain.  Advised against performing HEP on days that she performs a lot of housework and to back down to Yellow or Red TB for hip ABD exercises.   PT Next Visit Plan assess for d/c vs continue   Consulted and Agree with Plan of Care Patient        Problem List Patient Active Problem List   Diagnosis Date Noted  . Idiopathic urticaria 06/02/2015  . Chronic rhinitis 06/02/2015  . 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 Lumbar stenosis 02/11/2015  . Abdominal pain, chronic, right lower quadrant 01/07/2015  . Right hip pain 10/31/2014  . Vitamin D deficiency 10/31/2014  . Osteopenia 10/18/2014  . OSA (obstructive sleep apnea) 07/16/2014  . Migraines 07/16/2014  . TMJ (dislocation of temporomandibular joint) 07/16/2014  . HLD (hyperlipidemia) 03/22/2014  . S/P Nissen fundoplication May 0000000 0000000  . Dysphagia 01/24/2008  . DEPRESSION/ANXIETY 12/02/2007  . ESOPHAGEAL STRICTURE 12/02/2007  . GERD 12/02/2007  . Irritable bowel syndrome 12/02/2007  . Fibromyalgia 12/02/2007  . VASOVAGAL SYNCOPE 12/02/2007    Nakisha Chai PT, OCS 09/11/2015, 2:05 PM  [facility] [address]  [place], [place], [address] Phone: [phone]   Fax:  [phone]  Name: Vanessa Oliver MRN: [id number] Date of Birth:  Aug 24, 1950

## 2015-09-15 ENCOUNTER — Ambulatory Visit: Payer: Medicare Other | Admitting: Physical Therapy

## 2015-09-15 DIAGNOSIS — M25571 Pain in right ankle and joints of right foot: Secondary | ICD-10-CM | POA: Diagnosis not present

## 2015-09-15 DIAGNOSIS — M545 Low back pain, unspecified: Secondary | ICD-10-CM

## 2015-09-15 DIAGNOSIS — R262 Difficulty in walking, not elsewhere classified: Secondary | ICD-10-CM | POA: Diagnosis not present

## 2015-09-15 NOTE — Therapy (Signed)
[facility] [address]  [place], [place], [address] Phone: [phone]   Fax:  [phone]  Physical Therapy Treatment  Patient Details  Name: Vanessa Oliver MRN: [id number] Date of Birth: Mar 07, 1951 Referring Provider: Wylene Simmer  Encounter Date: 09/15/2015      PT End of Session - 09/15/15 1323    Visit Number 22   Number of Visits 23   Date for PT Re-Evaluation 09/16/15   PT Start Time [id number]   PT Stop Time 1405   PT Time Calculation (min) 43 min      Past Medical History  Diagnosis Date  . Arthritis   . Hyperlipidemia   . Hiatal hernia   . Fibromyalgia   . IBS (irritable bowel syndrome)   . Esophageal stricture   . Plantar fasciitis   . Rosacea   . TMJ (temporomandibular joint disorder)   . Vasovagal syncope   . Allergy     takes Singulair at night  . Fibromyalgia   . Depression     takes CYmbalta daily  . GERD (gastroesophageal reflux disease)     takes Omeprazole daily  . Family history of adverse reaction to anesthesia     oldest brother had trouble with anesthesia a long time ago but can't recall what  . History of bronchitis   . Vasovagal episode back in the 80's  . Weakness     right leg  . Joint pain   . Chronic back pain     stenosis  . Joint swelling   . Hemorrhoids     Past Surgical History  Procedure Laterality Date  . Nissen fundoplication  0000000  . Retinal laser procedure Right 02/01/2011    right eye   . Nasal septum surgery  1970  . Knee arthroscopy Right 1994    x 3  . 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 Carpal tunnel release Bilateral 2004  . Trigger finger release Bilateral 2005  . Abdominal hysterectomy  1997  . Varicose vein surgery Bilateral 2007, 2009    left 2007, right 2009  . Mallet finger Right 2011  . Vitrectomy Right 2012  . Cataract extraction Right 2013    Dr Ellison Hughs  . Carpometacarpal (cmc) fusion of thumb  2015    thumb  .  Tonsillectomy  1981  . Nissen fundoplication    . Colonoscopy    . Colonoscopy with esophagogastroduodenoscopy (egd) and esophageal dilation (ed)    . Eye surgery      scar tissue remove  . Lumbar laminectomy/decompression microdiscectomy Right 02/11/2015    Procedure: Laminectomy and Foraminotomy - Right - L3-L4;  Surgeon: Earnie Larsson, MD;  Location: [place] [facility];  Service: Neurosurgery;  Laterality: Right;  Laminectomy and Foraminotomy - Right - L3-L4    There were no vitals filed for this visit.  Visit Diagnosis:  Bilateral low back pain without sciatica      Subjective Assessment - 09/15/15 1324    Subjective States noted pain in B lower back/hips over the weekend.  States performed some self massage throughout the day and states this seemed to help and states is only a little tender today - "not really feeling it right now".   Currently in Pain? Yes   Pain Score 2    Pain Location Back   Pain Orientation Lower   Pain Score 0   Pain Location Ankle   Pain Orientation Right  TODAY'S TREATMENT TherEx - Bridge on Heels 15x Stretch B HS, Piri, SKTC, ITB, B LE Nerve Glides, Prone Mod Thomas RF stretch Side-Lying Hip ABD 15x each Lifting Diagonal Double Red TB 10x each - difficulty with form Mid Pulley staggered standing Low Row 5# 10x each Staggered Standing Punch Diagonal Double Blue TB 15x each Staggered Standing Low Row Double Blue TB 15x each             PT Education - 09/15/15 1404    Education provided Yes   Education Details Issued Blue TB and instructed in Staggered Standing Low Row and Punch Diagonals.   Person(s) Educated Patient   Methods Explanation;Demonstration   Comprehension Verbalized understanding;Returned demonstration          PT Short Term Goals - 04/21/15 0906    PT SHORT TERM GOAL #1   Title pt independent with initial HEP by 03/28/15   Status Achieved           PT Long Term Goals - 09/03/15 1540    PT LONG TERM GOAL #1    Title Patient will be independent with advanced HEP as necessary by 09/16/15   Status On-going   PT LONG TERM GOAL #2   Title Patient will demonstrate right ankle strength 4/5 or greater without pain for improved ankle stability   Status On-going   PT LONG TERM GOAL #3   Title Pt able to drive without noting R ankle pain by 09/16/15  states hasn't bothered her lately   Status Achieved   PT LONG TERM GOAL #4   Title Patient able to return to performing chores, exercise, and recreational activities without limitation by foot/ankle pain by 09/16/15   Status On-going               Plan - 09/15/15 1405    Clinical Impression Statement One more treatment on POC.  Reviewed some functional training exercises today with hope of helping to limit pain with housework which seems to be pt's chief source of pain.  She has difficulty with good mechanics and appropriate wt shifting / trunk control.  Will review with her again at next visit which will likely be her last.   PT Next Visit Plan review HEP update from today and assess for d/c vs continue   Consulted and Agree with Plan of Care Patient        Problem List Patient Active Problem List   Diagnosis Date Noted  . Idiopathic urticaria 06/02/2015  . Chronic rhinitis 06/02/2015  . 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 Lumbar stenosis 02/11/2015  . Abdominal pain, chronic, right lower quadrant 01/07/2015  . Right hip pain 10/31/2014  . Vitamin D deficiency 10/31/2014  . Osteopenia 10/18/2014  . OSA (obstructive sleep apnea) 07/16/2014  . Migraines 07/16/2014  . TMJ (dislocation of temporomandibular joint) 07/16/2014  . HLD (hyperlipidemia) 03/22/2014  . S/P Nissen fundoplication May 0000000 0000000  . Dysphagia 01/24/2008  . DEPRESSION/ANXIETY 12/02/2007  . ESOPHAGEAL STRICTURE 12/02/2007  . GERD 12/02/2007  . Irritable bowel syndrome 12/02/2007  . Fibromyalgia 12/02/2007  . VASOVAGAL SYNCOPE  12/02/2007    Can Lucci PT, OCS 09/15/2015, 4:42 PM  [facility] [address]  [place], [place], [address] Phone: [phone]   Fax:  [phone]  Name: Vanessa Oliver MRN: [id number] Date of Birth: [birth year]

## 2015-09-18 ENCOUNTER — Ambulatory Visit: Payer: Medicare Other | Admitting: Physical Therapy

## 2015-09-18 DIAGNOSIS — R262 Difficulty in walking, not elsewhere classified: Secondary | ICD-10-CM

## 2015-09-18 DIAGNOSIS — M25571 Pain in right ankle and joints of right foot: Secondary | ICD-10-CM | POA: Diagnosis not present

## 2015-09-18 DIAGNOSIS — M545 Low back pain, unspecified: Secondary | ICD-10-CM

## 2015-09-18 NOTE — Therapy (Signed)
[facility] [address]  [place], [place], [address] Phone: [phone]   Fax:  [phone]  Physical Therapy Treatment  Patient Details  Name: Vanessa Oliver MRN: [id number] Date of Birth: 08/31/1950 Referring Provider: Wylene Simmer  Encounter Date: 09/18/2015      PT End of Session - 09/18/15 1323    Visit Number 23   Number of Visits 23   PT Start Time 1583   PT Stop Time 1355   PT Time Calculation (min) 37 min      Past Medical History  Diagnosis Date  . Arthritis   . Hyperlipidemia   . Hiatal hernia   . Fibromyalgia   . IBS (irritable bowel syndrome)   . Esophageal stricture   . Plantar fasciitis   . Rosacea   . TMJ (temporomandibular joint disorder)   . Vasovagal syncope   . Allergy     takes Singulair at night  . Fibromyalgia   . Depression     takes CYmbalta daily  . GERD (gastroesophageal reflux disease)     takes Omeprazole daily  . Family history of adverse reaction to anesthesia     oldest brother had trouble with anesthesia a long time ago but can't recall what  . History of bronchitis   . Vasovagal episode back in the 80's  . Weakness     right leg  . Joint pain   . Chronic back pain     stenosis  . Joint swelling   . Hemorrhoids     Past Surgical History  Procedure Laterality Date  . Nissen fundoplication  0940  . Retinal laser procedure Right 02/01/2011    right eye   . Nasal septum surgery  1970  . Knee arthroscopy Right 1994    x 3  . 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 Carpal tunnel release Bilateral 2004  . Trigger finger release Bilateral 2005  . Abdominal hysterectomy  1997  . Varicose vein surgery Bilateral 2007, 2009    left 2007, right 2009  . Mallet finger Right 2011  . Vitrectomy Right 2012  . Cataract extraction Right 2013    Dr Ellison Hughs  . Carpometacarpal (cmc) fusion of thumb  2015    thumb  . Tonsillectomy  1981  . Nissen  fundoplication    . Colonoscopy    . Colonoscopy with esophagogastroduodenoscopy (egd) and esophageal dilation (ed)    . Eye surgery      scar tissue remove  . Lumbar laminectomy/decompression microdiscectomy Right 02/11/2015    Procedure: Laminectomy and Foraminotomy - Right - L3-L4;  Surgeon: Earnie Larsson, MD;  Location: [place] [facility];  Service: Neurosurgery;  Laterality: Right;  Laminectomy and Foraminotomy - Right - L3-L4    There were no vitals filed for this visit.  Visit Diagnosis:  Bilateral low back pain without sciatica  Pain in joint, ankle and foot, right  Difficulty walking      Subjective Assessment - 09/18/15 1318    Subjective States when she wakes in the AM she notes pain across R anterior ankle but states this goes away within 15-20 minutes of moving around. Denies pain currently.   Currently in Pain? No/denies            [facility] PT Assessment - 09/18/15 0001    AROM   Right Ankle Dorsiflexion 13  OKC   Strength   Strength Assessment Site Hip;Knee;Ankle  Right/Left Hip Right;Left   Right Hip Flexion 5/5   Right Hip Extension 4+/5   Right Hip External Rotation  5/5   Right Hip Internal Rotation 5/5   Right Hip ABduction 4+/5   Right Hip ADduction 5/5   Left Hip Flexion 5/5   Left Hip Extension 5/5   Left Hip External Rotation 5/5   Left Hip Internal Rotation 5/5   Left Hip ABduction 4+/5   Left Hip ADduction 5/5   Right/Left Knee Right;Left   Right Knee Flexion 5/5   Right Knee Extension 5/5   Left Knee Flexion 5/5   Left Knee Extension 5/5   Right/Left Ankle Right;Left   Right Ankle Dorsiflexion 5/5   Right Ankle Plantar Flexion 5/5   Right Ankle Inversion 5/5   Right Ankle Eversion 5/5   Left Ankle Dorsiflexion 5/5   Left Ankle Plantar Flexion 5/5   Left Ankle Inversion 5/5   Left Ankle Eversion 5/5   FABER test   findings Negative   Slump test   Findings Negative   Prone Knee Bend Test   Findings Negative   Straight Leg Raise    Findings Negative        TODAY'S TREATMENT B LE MMT assessment Special Testing Reviewed current HEP and reviewed areas to make certain to not miss: Ankle DF stretches and Hip ABDuction exercises.          PT Short Term Goals - 04/21/15 0906    PT SHORT TERM GOAL #1   Title pt independent with initial HEP by 03/28/15   Status Achieved           PT Long Term Goals - October 14, 2015 1345    PT LONG TERM GOAL #1   Title Patient will be independent with advanced HEP as necessary   Status Achieved   PT LONG TERM GOAL #2   Title Patient will demonstrate right ankle strength 4/5 or greater without pain for improved ankle stability   Status Achieved   PT LONG TERM GOAL #3   Title Pt able to drive without noting R ankle pain by 09/16/15   Status Achieved   PT LONG TERM GOAL #4   Title Patient able to return to performing chores, exercise, and recreational activities without limitation by foot/ankle pain   Status Achieved               Plan - 10-14-15 1356    Clinical Impression Statement Excellent progress lately with regard to LBP and R Ankle pain.  All special testing to l-spine normal today, B LE MMT 5/5 other than B Hip ABD sand R Hip Extension which are 4+/5, no pain with any MMT.  R Ankle DF OKC PROM improved to 13 degrees and CKC seems nearly equal to L.  She notes 15-20 minutes of pain in R anterior ankle upon waking most mornings but otherwise is mostly pain-free.  We reviewed HEP today and she is consistently performing pretty extensive exercise routine to include use of foam pad, [employer], and minitrampolene.  She has met all PT goals and is therefore being discharged from PT at this time.   Consulted and Agree with Plan of Care Patient          G-Codes - Oct 14, 2015 1401    Functional Assessment Tool Used foto 17% limitation   Functional Limitation Mobility: Walking and moving around   Mobility: Walking and Moving Around Current Status [phone]) 100 percent impaired,  limited or restricted  Mobility: Walking and Moving Around Goal Status [phone]) At least 20 percent but less than 40 percent impaired, limited or restricted   Mobility: Walking and Moving Around Discharge Status [phone]) At least 1 percent but less than 20 percent impaired, limited or restricted      Problem List Patient Active Problem List   Diagnosis Date Noted  . Idiopathic urticaria 06/02/2015  . Chronic rhinitis 06/02/2015  . 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 Lumbar stenosis 02/11/2015  . Abdominal pain, chronic, right lower quadrant 01/07/2015  . Right hip pain 10/31/2014  . Vitamin D deficiency 10/31/2014  . Osteopenia 10/18/2014  . OSA (obstructive sleep apnea) 07/16/2014  . Migraines 07/16/2014  . TMJ (dislocation of temporomandibular joint) 07/16/2014  . HLD (hyperlipidemia) 03/22/2014  . S/P Nissen fundoplication May 4076 80/88/1103  . Dysphagia 01/24/2008  . DEPRESSION/ANXIETY 12/02/2007  . ESOPHAGEAL STRICTURE 12/02/2007  . GERD 12/02/2007  . Irritable bowel syndrome 12/02/2007  . Fibromyalgia 12/02/2007  . VASOVAGAL SYNCOPE 12/02/2007    Manasa Spease PT, OCS 09/18/2015, 2:02 PM  [facility] [address]  [place], [place], [address] Phone: [phone]   Fax:  [phone]  Name: Vanessa Oliver MRN: [id number] Date of Birth: Mar 27, 1951   PHYSICAL THERAPY DISCHARGE SUMMARY  Visits from Start of Care: 23  Current functional level related to goals / functional outcomes: GOALS MET   Remaining deficits: Intermittent low grade R ankle pain   Education / Equipment: HEP Plan: Patient agrees to discharge.  Patient goals were met. Patient is being discharged due to meeting the stated rehab goals.  ?????        Leonette Most PT, OCS 09/18/2015 2:04 PM

## 2015-09-19 DIAGNOSIS — M76821 Posterior tibial tendinitis, right leg: Secondary | ICD-10-CM | POA: Diagnosis not present

## 2015-09-22 ENCOUNTER — Telehealth: Payer: Self-pay | Admitting: *Deleted

## 2015-09-22 NOTE — Telephone Encounter (Signed)
Received Statement of Medical Necessity; forwarded to provider/SLS 03/13

## 2015-09-25 DIAGNOSIS — M1711 Unilateral primary osteoarthritis, right knee: Secondary | ICD-10-CM | POA: Diagnosis not present

## 2015-10-01 ENCOUNTER — Telehealth: Payer: Self-pay | Admitting: Family

## 2015-10-01 ENCOUNTER — Ambulatory Visit (INDEPENDENT_AMBULATORY_CARE_PROVIDER_SITE_OTHER): Payer: Medicare Other | Admitting: Family

## 2015-10-01 ENCOUNTER — Encounter: Payer: Self-pay | Admitting: Family

## 2015-10-01 VITALS — BP 138/80 | HR 77 | Temp 97.9°F | Resp 16 | Ht 64.0 in | Wt 167.8 lb

## 2015-10-01 DIAGNOSIS — L989 Disorder of the skin and subcutaneous tissue, unspecified: Secondary | ICD-10-CM

## 2015-10-01 DIAGNOSIS — G4733 Obstructive sleep apnea (adult) (pediatric): Secondary | ICD-10-CM | POA: Diagnosis not present

## 2015-10-01 DIAGNOSIS — F341 Dysthymic disorder: Secondary | ICD-10-CM | POA: Diagnosis not present

## 2015-10-01 MED ORDER — PRAVASTATIN SODIUM 20 MG PO TABS: 20.0000 mg | ORAL_TABLET | Freq: Every day | ORAL | Status: DC

## 2015-10-01 MED ORDER — DULOXETINE HCL 30 MG PO CPEP: ORAL_CAPSULE | ORAL | Status: DC

## 2015-10-01 NOTE — Assessment & Plan Note (Signed)
Pt with ongoing fatigue despite use of CPAP. Will request download report from [facility] to check for leak. Will also refer to Dr. Elsworth Soho, sleep specialist for further evaluation. D/c gabapentin since this is not helping her symptoms and may be making her sleepier.

## 2015-10-01 NOTE — Progress Notes (Signed)
Subjective:    Patient ID: Vanessa Oliver, female    DOB: [birth year], 65 y.o.   MRN: [id number]  HPI  Vanessa Oliver is a 65 yr old female who presents today for follow up.  1) Depression/anxiety- currently maintained on cymbalta.  She wants to try to come off of cymbalta because she is concerned it is contributing to her sleepiness.  Reports mood is good.  Wonders about her diagnosis of fibromyalgia and wonders if she really needs cymbalta. Was using gabapentin to help with nerve pain but reports that this does not help- has not noticed a difference.   2) Hyperlipidemia-  Maintained on pravastatin. Denies myalgia.  Lab Results  Component Value Date   CHOL 172 06/09/2015   HDL 36.50* 06/09/2015   LDLCALC 108* 06/09/2015   TRIG 136.0 06/09/2015   CHOLHDL 5 06/09/2015   3) OSA/ Fatigue- Reports ongoing fatigue despite use of CPAP.  Sleeps 7-9 hours a night. Reports that there is a whistling sound from her mask. Not sure it is fitting properly.    4) Skin lesion- left elbow.  Would like to see dermatology. Also reports that they removed a lesion on her back that she would like re-evaluated.    Review of Systems See HPI  Past Medical History  Diagnosis Date  . Arthritis   . Hyperlipidemia   . Hiatal hernia   . Fibromyalgia   . IBS (irritable bowel syndrome)   . Esophageal stricture   . Plantar fasciitis   . Rosacea   . TMJ (temporomandibular joint disorder)   . Vasovagal syncope   . Allergy     takes Singulair at night  . Fibromyalgia   . Depression     takes CYmbalta daily  . GERD (gastroesophageal reflux disease)     takes Omeprazole daily  . Family history of adverse reaction to anesthesia     oldest brother had trouble with anesthesia a long time ago but can't recall what  . History of bronchitis   . Vasovagal episode back in the 80's  . Weakness     right leg  . Joint pain   . Chronic back pain     stenosis  . Joint swelling   . Hemorrhoids     Social  History   Social History  . Marital Status: Married    Spouse Name: N/A  . Number of Children: 0  . Years of Education: N/A   Occupational History  . clerical    Social History Main Topics  . Smoking status: Never Smoker   . Smokeless tobacco: Never Used  . Alcohol Use: 0.0 oz/week    0 Standard drinks or equivalent per week     Comment: rarely  . Drug Use: No  . Sexual Activity: Yes    Birth Control/ Protection: Surgical   Other Topics Concern  . Not on file   Social History Narrative   Works as an Glass blower/designer   Married- husband is 70 years older than her   Former Dance movement psychotherapist up up [employer]   Has cats   Enjoys reading   No children    Past Surgical History  Procedure Laterality Date  . Nissen fundoplication  0000000  . Retinal laser procedure Right 02/01/2011    right eye   . Nasal septum surgery  1970  . Knee arthroscopy Right 1994    x 3  . 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  Carpal tunnel release Bilateral 2004  . Trigger finger release Bilateral 2005  . Abdominal hysterectomy  1997  . Varicose vein surgery Bilateral 2007, 2009    left 2007, right 2009  . Mallet finger Right 2011  . Vitrectomy Right 2012  . Cataract extraction Right 2013    Dr Ellison Hughs  . Carpometacarpal (cmc) fusion of thumb  2015    thumb  . Tonsillectomy  1981  . Nissen fundoplication    . Colonoscopy    . Colonoscopy with esophagogastroduodenoscopy (egd) and esophageal dilation (ed)    . Eye surgery      scar tissue remove  . Lumbar laminectomy/decompression microdiscectomy Right 02/11/2015    Procedure: Laminectomy and Foraminotomy - Right - L3-L4;  Surgeon: Earnie Larsson, MD;  Location: [place] [facility];  Service: Neurosurgery;  Laterality: Right;  Laminectomy and Foraminotomy - Right - L3-L4    Family History  Problem Relation Age of Onset  . Breast cancer Mother   . Arthritis Mother   . Hyperlipidemia Mother   . Hypertension Mother   . Lung cancer Father   .  Pancreatic cancer Father   . Skin cancer Father   . Arthritis Father   . Celiac disease Brother   . Hyperlipidemia Maternal Grandfather   . Heart disease Maternal Grandfather   . Stroke Maternal Grandfather     Allergies  Allergen Reactions  . Cefuroxime Axetil     Hives / "throat swelling"  . Chlorzoxazone Rash and Other (See Comments)    "breathing problems"   . Oxycodone     Hallucinations, rash  . Penicillins Anaphylaxis  . Adhesive [Tape]     rash  . Cataflam [Diclofenac Potassium]     Lip swelling  . Clarithromycin Diarrhea  . Flagyl [Metronidazole Hcl]     ?diarrhea  . Glucosamine Hives  . Guaifenesin & Derivatives     Stomach upset  . Hydrocodone-Acetaminophen     REACTION: rash, tightening of throat  . Ketorolac Tromethamine     ?reaction  . Pentazocine     Involuntary twitching  . Pneumovax [Pneumococcal Polysaccharide Vaccine] Other (See Comments)    Redness/swelling at site, nausea  . Smz-Tmp Ds [Sulfamethoxazole W/Trimethoprim (Co-Trimoxazole)]     ?unknown reaction  . Tramadol     constipation  . Trazodone And Nefazodone Other (See Comments)    Scratchy throat / congestion    Current Outpatient Prescriptions on File Prior to Visit  Medication Sig Dispense Refill  . aspirin 81 MG tablet Take 81 mg by mouth daily.    . Azelaic Acid (FINACEA) 15 % cream Apply topically 1 day or 1 dose. After skin is thoroughly washed and patted dry, gently but thoroughly massage a thin film of azelaic acid cream into the affected area twice daily, in the morning and evening.     . benzoyl peroxide 5 % gel Apply 1 application topically daily.     . Calcium Carbonate-Vitamin D (CALTRATE 600+D) 600-400 MG-UNIT per tablet Take 1 tablet by mouth 2 (two) times daily. (Patient taking differently: Take 2 tablets by mouth 2 (two) times daily. )    . cholecalciferol (VITAMIN D) 1000 UNITS tablet Take 1,000 Units by mouth daily.     . DULoxetine (CYMBALTA) 60 MG capsule TAKE 1  CAPSULE DAILY 90 capsule 0  . FIBER PO Take 2 capsules by mouth 2 (two) times daily.    Marland Kitchen gabapentin (NEURONTIN) 100 MG capsule Take 1 capsule (100 mg total) by mouth 3 (three)  times daily. 270 capsule 0  . Multiple Vitamin (MULTIVITAMIN) tablet Take 1 tablet by mouth daily.    Marland Kitchen omeprazole (PRILOSEC) 40 MG capsule Take 1 capsule (40 mg total) by mouth 2 (two) times daily. 180 capsule 1  . pravastatin (PRAVACHOL) 20 MG tablet Take 1 tablet (20 mg total) by mouth at bedtime. 30 tablet 0  . Probiotic Product (PROBIOTIC DAILY PO) Take 1 tablet by mouth daily.    . vitamin C (ASCORBIC ACID) 500 MG tablet Take 500 mg by mouth daily.    . VOLTAREN 1 % GEL APPLY 2 GRAMS TOPICALLY TWICE A DAY AS NEEDED 300 g 1   No current facility-administered medications on file prior to visit.    BP 138/80 mmHg  Pulse 77  Temp(Src) 97.9 F (36.6 C) (Oral)  Resp 16  Ht 5\' 4"  (1.626 m)  Wt 167 lb 12.8 oz (76.114 kg)  BMI 28.79 kg/m2  SpO2 98%  LMP 05/14/1996       Objective:   Physical Exam  Constitutional: She is oriented to person, place, and time. She appears well-developed and well-nourished.  HENT:  Head: Normocephalic and atraumatic.  Cardiovascular: Normal rate, regular rhythm and normal heart sounds.   No murmur heard. Pulmonary/Chest: Effort normal and breath sounds normal. No respiratory distress. She has no wheezes.  Neurological: She is alert and oriented to person, place, and time.  Skin:  Small ulcerated lesion on left elbow  Psychiatric: She has a normal mood and affect. Her behavior is normal. Judgment and thought content normal.          Assessment & Plan:  Skin lesion- refer to derm.

## 2015-10-01 NOTE — Telephone Encounter (Signed)
Please contact [facility] and request a download report from her cpap. Thanks

## 2015-10-01 NOTE — Patient Instructions (Addendum)
Please taper cymbalta.   Stop neurontin. You will be contacted about your appointment to Dr. Elsworth Soho and Dermatology. 4-6 week follow up please.

## 2015-10-01 NOTE — Progress Notes (Signed)
Pre visit review using our clinic review tool, if applicable. No additional management support is needed unless otherwise documented below in the visit note. 

## 2015-10-01 NOTE — Assessment & Plan Note (Signed)
Stable. Will attempt taper off of cymbalta. Advised pt to contact us if she has any mood changes with taper. Re-evaluate in 4-6 weeks.

## 2015-10-02 NOTE — Telephone Encounter (Signed)
[facility] contacted.  Download report requested.  Report being faxed.  Awaiting fax.

## 2015-10-03 ENCOUNTER — Encounter: Payer: Self-pay | Admitting: Family

## 2015-10-06 MED ORDER — PRAVASTATIN SODIUM 20 MG PO TABS: 20.0000 mg | ORAL_TABLET | Freq: Every day | ORAL | Status: DC

## 2015-10-06 NOTE — Telephone Encounter (Signed)
Pravastatin refill sent to pharmacy. Please advise re: above request for appt change?

## 2015-10-09 DIAGNOSIS — L820 Inflamed seborrheic keratosis: Secondary | ICD-10-CM | POA: Diagnosis not present

## 2015-10-09 DIAGNOSIS — L718 Other rosacea: Secondary | ICD-10-CM | POA: Diagnosis not present

## 2015-10-09 DIAGNOSIS — L910 Hypertrophic scar: Secondary | ICD-10-CM | POA: Diagnosis not present

## 2015-10-10 NOTE — Telephone Encounter (Signed)
Report received and forwarded to PCP for review. 

## 2015-10-28 ENCOUNTER — Encounter: Payer: Self-pay | Admitting: Family

## 2015-10-28 MED ORDER — DULOXETINE HCL 30 MG PO CPEP: 30.0000 mg | ORAL_CAPSULE | Freq: Every day | ORAL | Status: DC

## 2015-11-05 ENCOUNTER — Encounter: Payer: Self-pay | Admitting: Family

## 2015-11-05 DIAGNOSIS — K219 Gastro-esophageal reflux disease without esophagitis: Secondary | ICD-10-CM

## 2015-11-11 DIAGNOSIS — R1314 Dysphagia, pharyngoesophageal phase: Secondary | ICD-10-CM | POA: Diagnosis not present

## 2015-11-11 DIAGNOSIS — M1711 Unilateral primary osteoarthritis, right knee: Secondary | ICD-10-CM | POA: Diagnosis not present

## 2015-11-11 DIAGNOSIS — K219 Gastro-esophageal reflux disease without esophagitis: Secondary | ICD-10-CM | POA: Diagnosis not present

## 2015-11-12 ENCOUNTER — Ambulatory Visit: Payer: Medicare Other | Admitting: Family

## 2015-11-14 ENCOUNTER — Encounter: Payer: Self-pay | Admitting: Family

## 2015-11-14 DIAGNOSIS — K229 Disease of esophagus, unspecified: Secondary | ICD-10-CM | POA: Diagnosis not present

## 2015-11-14 DIAGNOSIS — K219 Gastro-esophageal reflux disease without esophagitis: Secondary | ICD-10-CM | POA: Diagnosis not present

## 2015-11-14 DIAGNOSIS — R1314 Dysphagia, pharyngoesophageal phase: Secondary | ICD-10-CM | POA: Diagnosis not present

## 2015-11-14 DIAGNOSIS — K222 Esophageal obstruction: Secondary | ICD-10-CM | POA: Diagnosis not present

## 2015-11-17 ENCOUNTER — Encounter: Payer: Self-pay | Admitting: Family

## 2015-11-17 ENCOUNTER — Other Ambulatory Visit: Payer: Self-pay | Admitting: Allergy

## 2015-11-17 ENCOUNTER — Ambulatory Visit (INDEPENDENT_AMBULATORY_CARE_PROVIDER_SITE_OTHER): Payer: Medicare Other | Admitting: Family

## 2015-11-17 ENCOUNTER — Telehealth: Payer: Self-pay | Admitting: Allergy

## 2015-11-17 VITALS — BP 122/75 | HR 79 | Temp 97.9°F | Resp 16 | Ht 64.0 in | Wt 166.2 lb

## 2015-11-17 DIAGNOSIS — R61 Generalized hyperhidrosis: Secondary | ICD-10-CM

## 2015-11-17 DIAGNOSIS — L74519 Primary focal hyperhidrosis, unspecified: Secondary | ICD-10-CM

## 2015-11-17 DIAGNOSIS — T7840XA Allergy, unspecified, initial encounter: Secondary | ICD-10-CM | POA: Diagnosis not present

## 2015-11-17 DIAGNOSIS — F341 Dysthymic disorder: Secondary | ICD-10-CM | POA: Diagnosis not present

## 2015-11-17 MED ORDER — DULOXETINE HCL 20 MG PO CPEP: 20.0000 mg | ORAL_CAPSULE | Freq: Every day | ORAL | Status: DC

## 2015-11-17 MED ORDER — EPINEPHRINE 0.3 MG/0.3ML IJ SOAJ: 0.3000 mg | Freq: Once | INTRAMUSCULAR | Status: AC

## 2015-11-17 NOTE — Progress Notes (Signed)
Subjective:    Patient ID: Vanessa Oliver, female    DOB: Apr 18, 1951, 65 y.o.   MRN: [id number]  HPI  Vanessa Oliver is a 65 yr old female who presents today for follow up of her depression.  She tried to wean off of cymbalta but reports that she was unable to tolerate the withdrawal symptoms. Reports that she had bad "brain zaps" when she tried to come off the medication.    Reports that she contacted her allergist re:  Itching.  He recommended benadryl, zyrtec,  D/c pain meds except for tylenol.  He also gave her rx for Epipen. Reports resolution of itching.   Reports that she sweats really easily.    Review of Systems    see HPI  Past Medical History  Diagnosis Date  . Arthritis   . Hyperlipidemia   . Hiatal hernia   . Fibromyalgia   . IBS (irritable bowel syndrome)   . Esophageal stricture   . Plantar fasciitis   . Rosacea   . TMJ (temporomandibular joint disorder)   . Vasovagal syncope   . Allergy     takes Singulair at night  . Fibromyalgia   . Depression     takes CYmbalta daily  . GERD (gastroesophageal reflux disease)     takes Omeprazole daily  . Family history of adverse reaction to anesthesia     oldest brother had trouble with anesthesia a long time ago but can't recall what  . History of bronchitis   . Vasovagal episode back in the 80's  . Weakness     right leg  . Joint pain   . Chronic back pain     stenosis  . Joint swelling   . Hemorrhoids      Social History   Social History  . Marital Status: Married    Spouse Name: N/A  . Number of Children: 0  . Years of Education: N/A   Occupational History  . clerical    Social History Main Topics  . Smoking status: Never Smoker   . Smokeless tobacco: Never Used  . Alcohol Use: 0.0 oz/week    0 Standard drinks or equivalent per week     Comment: rarely  . Drug Use: No  . Sexual Activity: Yes    Birth Control/ Protection: Surgical   Other Topics Concern  . 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 Nissen fundoplication  0000000  . Retinal laser procedure Right 02/01/2011    right eye   . Nasal septum surgery  1970  . Knee arthroscopy Right 1994    x 3  . 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 Carpal tunnel release Bilateral 2004  . Trigger finger release Bilateral 2005  . Abdominal hysterectomy  1997  . Varicose vein surgery Bilateral 2007, 2009    left 2007, right 2009  . Mallet finger Right 2011  . Vitrectomy Right 2012  . Cataract extraction Right 2013    Dr Ellison Hughs  . Carpometacarpal (cmc) fusion of thumb  2015    thumb  . Tonsillectomy  1981  . Nissen fundoplication    . Colonoscopy    .  Colonoscopy with esophagogastroduodenoscopy (egd) and esophageal dilation (ed)    . Eye surgery      scar tissue remove  . Lumbar laminectomy/decompression microdiscectomy Right 02/11/2015    Procedure: Laminectomy and Foraminotomy - Right - L3-L4;  Surgeon: Earnie Larsson, MD;  Location: [place] [facility];  Service: Neurosurgery;  Laterality: Right;  Laminectomy and Foraminotomy - Right - L3-L4    Family History  Problem Relation Age of Onset  . Breast cancer Mother   . Arthritis Mother   . Hyperlipidemia Mother   . Hypertension Mother   . Lung cancer Father   . Pancreatic cancer Father   . Skin cancer Father   . Arthritis Father   . Celiac disease Brother   . Hyperlipidemia Maternal Grandfather   . Heart disease Maternal Grandfather   . Stroke Maternal Grandfather     Allergies  Allergen Reactions  . Aleve [Naproxen Sodium]     Swelling, itching  . Cefuroxime Axetil     Hives / "throat swelling"  . Chlorzoxazone Rash and Other (See Comments)    "breathing problems"   . Oxycodone     Hallucinations, rash  . Penicillins Anaphylaxis  . Adhesive  [Tape]     rash  . Cataflam [Diclofenac Potassium]     Lip swelling  . Clarithromycin Diarrhea  . Flagyl [Metronidazole Hcl]     ?diarrhea  . Glucosamine Hives  . Guaifenesin & Derivatives     Stomach upset  . Hydrocodone-Acetaminophen     REACTION: rash, tightening of throat  . Ketorolac Tromethamine     ?reaction  . Pentazocine     Involuntary twitching  . Pneumovax [Pneumococcal Polysaccharide Vaccine] Other (See Comments)    Redness/swelling at site, nausea  . Prevnar [Pneumococcal 13-Val Conj Vacc] Other (See Comments)    Swelling, redness, nausea  . Smz-Tmp Ds [Sulfamethoxazole W/Trimethoprim (Co-Trimoxazole)]     ?unknown reaction  . Tramadol     constipation  . Trazodone And Nefazodone Other (See Comments)    Scratchy throat / congestion    Current Outpatient Prescriptions on File Prior to Visit  Medication Sig Dispense Refill  . aspirin 81 MG tablet Take 81 mg by mouth daily.    . Azelaic Acid (FINACEA) 15 % cream Apply topically 1 day or 1 dose. After skin is thoroughly washed and patted dry, gently but thoroughly massage a thin film of azelaic acid cream into the affected area twice daily, in the morning and evening.     . benzoyl peroxide 5 % gel Apply 1 application topically daily.     . Calcium Carbonate-Vitamin D (CALTRATE 600+D) 600-400 MG-UNIT per tablet Take 1 tablet by mouth 2 (two) times daily. (Patient taking differently: Take 2 tablets by mouth 2 (two) times daily. )    . cholecalciferol (VITAMIN D) 1000 UNITS tablet Take 1,000 Units by mouth daily.     Marland Kitchen FIBER PO Take 2 capsules by mouth 2 (two) times daily.    . Multiple Vitamin (MULTIVITAMIN) tablet Take 1 tablet by mouth daily.    Marland Kitchen omeprazole (PRILOSEC) 40 MG capsule Take 1 capsule (40 mg total) by mouth 2 (two) times daily. 180 capsule 1  . pravastatin (PRAVACHOL) 20 MG tablet Take 1 tablet (20 mg total) by mouth at bedtime. 90 tablet 1  . Probiotic Product (PROBIOTIC DAILY PO) Take 1 tablet by mouth  daily.    . vitamin C (ASCORBIC ACID) 500 MG tablet Take 500 mg by mouth daily.    Marland Kitchen  VOLTAREN 1 % GEL APPLY 2 GRAMS TOPICALLY TWICE A DAY AS NEEDED 300 g 1   No current facility-administered medications on file prior to visit.    BP 122/75 mmHg  Pulse 79  Temp(Src) 97.9 F (36.6 C) (Oral)  Resp 16  Ht 5\' 4"  (1.626 m)  Wt 166 lb 3.2 oz (75.388 kg)  BMI 28.51 kg/m2  SpO2 99%  LMP 05/14/1996    Objective:   Physical Exam  Constitutional: She is oriented to person, place, and time. She appears well-developed and well-nourished.  HENT:  Mouth/Throat: Oropharynx is clear and moist.  No tongue/lip swelling.   Cardiovascular: Normal rate, regular rhythm and normal heart sounds.   No murmur heard. Pulmonary/Chest: Effort normal and breath sounds normal. No respiratory distress. She has no wheezes.  Neurological: She is alert and oriented to person, place, and time.  Psychiatric: She has a normal mood and affect. Her behavior is normal. Judgment and thought content normal.          Assessment & Plan:  Allergic reaction- resolved- management per Allergist.   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 ? If side effect of cymbalta. Will see how she does with cymbalta wean.

## 2015-11-17 NOTE — Telephone Encounter (Signed)
Informed patient about aleve and nonsteroid anti-inflammations and informed pt. How to use epi-pen and faxed one in. Seen patient on 04-07-15

## 2015-11-17 NOTE — Telephone Encounter (Signed)
PATIENT CALLED SAID SHE HAD SOME KIND OF REACTION.  TALKED TO PCP  THEY WANTED HER TO LET us KNOW.PATIENT SAID SHE TOOK  TWO ALEVE AND THEN DRANK SOME LADY GRAY TEA. ABOUT THIRTY MINUTES LATER SAID HANDS STARTED ITCHING REALLY  BAD. TOOK ZYRTEC AND USED BENADRYL CREAM ON HAND. SAID SHE DID HAVE A LITTLE THROAT SWELLING. PATIENT IS SEEING HER PCP TODAY. PLEASE ADVISE

## 2015-11-17 NOTE — Progress Notes (Signed)
Pre visit review using our clinic review tool, if applicable. No additional management support is needed unless otherwise documented below in the visit note. 

## 2015-11-17 NOTE — Patient Instructions (Addendum)
Change cymbalta to 20mg  once daily. Let me know you are feeling in 2 weeks.

## 2015-11-17 NOTE — Assessment & Plan Note (Signed)
Pt wants to come off of cymbalta. Advised pt to decrease to 20mg  once daily. Let me know how she is feeling in 2 weeks and we can consider further taper.

## 2015-11-17 NOTE — Telephone Encounter (Signed)
Noted  

## 2015-11-17 NOTE — Telephone Encounter (Signed)
This patient is not in Epic and I do not have access to her chart.  When did I last see this patient?  It appears that Aleve is on her drug allergy list, though I am not certain when this was put on the list.  For now, she should meticulously avoid Aleve and all other nonsteroid anti-inflammatories.  She may take acetaminophen as needed for pain relief.  She will need an epinephrine autoinjector 2 pack with instructions for its proper administration if she does not have one.

## 2015-11-19 NOTE — Telephone Encounter (Signed)
done

## 2015-11-27 DIAGNOSIS — L280 Lichen simplex chronicus: Secondary | ICD-10-CM | POA: Diagnosis not present

## 2015-12-04 ENCOUNTER — Encounter: Payer: Self-pay | Admitting: Pulmonary Disease

## 2015-12-04 ENCOUNTER — Ambulatory Visit (INDEPENDENT_AMBULATORY_CARE_PROVIDER_SITE_OTHER): Payer: Medicare Other | Admitting: Pulmonary Disease

## 2015-12-04 VITALS — BP 128/84 | HR 87 | Ht 64.0 in | Wt 167.0 lb

## 2015-12-04 DIAGNOSIS — G4733 Obstructive sleep apnea (adult) (pediatric): Secondary | ICD-10-CM | POA: Diagnosis not present

## 2015-12-04 NOTE — Patient Instructions (Signed)
Your CPAP is set at 10 cm and seems to be working well Use Flonase-1 spray each nare at bedtime Her husband needs a sleep study- we discussed padding to prevent injuries or moving to a different bed

## 2015-12-04 NOTE — Progress Notes (Signed)
Subjective:    Patient ID: Vanessa Oliver, female    DOB: 20-Sep-1950, 65 y.o.   MRN: [id number]  HPI  Chief Complaint  Patient presents with  . Sleep Consult    Referred by Dr. Inda Castle; wears CPAP.  Discuss how to get some sleep, not sleeping well due to husbands night terrors; Epworth Score: 38   64 year old with migraines depression Presents for management of OSA She had a deviated septum that was repaired at age 20. She wears a mouth piece for pain in her temporomandibular joint.  She presented with loud snoring and witnessed apneas.  PSG 07/2014-weight 163 pounds- RDI 23/hour, with 24 central apneas, total sleep time 277 minutes She was placed on CPAP of 10 cm after titration study with a nasal mask and seems to have adjusted well. This helped improve her daytime fatigue, she wakes up feeling rested, Epworth sleepiness score is 10, bedtime is around 11 PM, sleep latency can be about an hour, she is now retired, she sleeps on her stomach with CPAP pillow reports 3-4 nocturnal awakenings and is out of bed by 7:20 AM feeling rested with occasional dryness of mouth but denies headaches. She reports a 45 minute nap in the afternoons and wakes up feeling rested She has been able to get [facility] supplies from [facility] without any problems.  She reports that her 36 year old husband has been acting out his dreams, he has been crying out in his sleep and thrashing his arms and legs. He served in [employer] and has not been formally evaluated by sleep testing. She feels that this is causing her to have restless sleep.  CPAP download on 10 cm was reviewed and shows no residual events, good usage and in significant leak     Past Medical History  Diagnosis Date  . Arthritis   . Hyperlipidemia   . Hiatal hernia   . Fibromyalgia   . IBS (irritable bowel syndrome)   . Esophageal stricture   . Plantar fasciitis   . Rosacea   . TMJ (temporomandibular joint disorder)   .  Vasovagal syncope   . Allergy     takes Singulair at night  . Fibromyalgia   . Depression     takes CYmbalta daily  . GERD (gastroesophageal reflux disease)     takes Omeprazole daily  . Family history of adverse reaction to anesthesia     oldest brother had trouble with anesthesia a long time ago but can't recall what  . History of bronchitis   . Vasovagal episode back in the 80's  . Weakness     right leg  . Joint pain   . Chronic back pain     stenosis  . Joint swelling   . Hemorrhoids      Past Surgical History  Procedure Laterality Date  . Nissen fundoplication  0000000  . Retinal laser procedure Right 02/01/2011    right eye   . Nasal septum surgery  1970  . Knee arthroscopy Right 1994    x 3  . 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 Carpal tunnel release Bilateral 2004  . Trigger finger release Bilateral 2005  . Abdominal hysterectomy  1997  . Varicose vein surgery Bilateral 2007, 2009    left 2007, right 2009  . Mallet finger Right 2011  . Vitrectomy Right 2012  . Cataract extraction Right 2013    Dr Ellison Hughs  . Carpometacarpal (cmc) fusion of thumb  2015    thumb  . Tonsillectomy  1981  . Nissen fundoplication    . Colonoscopy    . Colonoscopy with esophagogastroduodenoscopy (egd) and esophageal dilation (ed)    . Eye surgery      scar tissue remove  . Lumbar laminectomy/decompression microdiscectomy Right 02/11/2015    Procedure: Laminectomy and Foraminotomy - Right - L3-L4;  Surgeon: Earnie Larsson, MD;  Location: [facility];  Service: Neurosurgery;  Laterality: Right;  Laminectomy and Foraminotomy - Right - L3-L4   Allergies  Allergen Reactions  . Aleve [Naproxen Sodium]     Swelling, itching  . Cefuroxime Axetil     Hives / "throat swelling"  . Chlorzoxazone Rash and Other (See Comments)    "breathing problems"   . Oxycodone     Hallucinations, rash  . Penicillins Anaphylaxis  . Adhesive [Tape]     rash  . Cataflam [Diclofenac Potassium]      Lip swelling  . Clarithromycin Diarrhea  . Flagyl [Metronidazole Hcl]     ?diarrhea  . Glucosamine Hives  . Guaifenesin & Derivatives     Stomach upset  . Hydrocodone-Acetaminophen     REACTION: rash, tightening of throat  . Ketorolac Tromethamine     ?reaction  . Pentazocine     Involuntary twitching  . Pneumovax [Pneumococcal Polysaccharide Vaccine] Other (See Comments)    Redness/swelling at site, nausea  . Prevnar [Pneumococcal 13-Val Conj Vacc] Other (See Comments)    Swelling, redness, nausea  . Smz-Tmp Ds [Sulfamethoxazole W/Trimethoprim (Co-Trimoxazole)]     ?unknown reaction  . Tramadol     constipation  . Trazodone And Nefazodone Other (See Comments)    Scratchy throat / congestion     Social History   Social History  . Marital Status: Married    Spouse Name: N/A  . Number of Children: 0  . Years of Education: N/A   Occupational History  . clerical    Social History Main Topics  . Smoking status: Never Smoker   . Smokeless tobacco: Never Used  . Alcohol Use: 0.0 oz/week    0 Standard drinks or equivalent per week     Comment: rarely  . Drug Use: No  . Sexual Activity: Yes    Birth Control/ Protection: Surgical   Other Topics Concern  . Not on file   Social History Narrative   Works as an Glass blower/designer   Married- husband is 32 years older than her   Former Dance movement psychotherapist up up [employer]   Has cats   Enjoys reading   No children     Family History  Problem Relation Age of Onset  . Breast cancer Mother   . Arthritis Mother   . Hyperlipidemia Mother   . Hypertension Mother   . Lung cancer Father   . Pancreatic cancer Father   . Skin cancer Father   . Arthritis Father   . Celiac disease Brother   . Hyperlipidemia Maternal Grandfather   . Heart disease Maternal Grandfather   . Stroke Maternal Grandfather      Review of Systems  Constitutional: Negative for fever, chills and unexpected weight change.  HENT: Negative for congestion,  dental problem, ear pain, nosebleeds, postnasal drip, rhinorrhea, sinus pressure, sneezing, sore throat, trouble swallowing and voice change.   Eyes: Negative for visual disturbance.  Respiratory: Negative for cough, choking and shortness of breath.   Cardiovascular: Negative for chest pain and leg swelling.  Gastrointestinal: Negative for vomiting, abdominal pain  and diarrhea.  Genitourinary: Negative for difficulty urinating.  Musculoskeletal: Negative for arthralgias.  Skin: Negative for rash.  Neurological: Negative for tremors, syncope and headaches.  Hematological: Does not bruise/bleed easily.       Objective:   Physical Exam  Gen. Pleasant, well-nourished, in no distress ENT - no lesions, no post nasal drip Neck: No JVD, no thyromegaly, no carotid bruits Lungs: no use of accessory muscles, no dullness to percussion, clear without rales or rhonchi  Cardiovascular: Rhythm regular, heart sounds  normal, no murmurs or gallops, no peripheral edema Musculoskeletal: No deformities, no cyanosis or clubbing         Assessment & Plan:

## 2015-12-04 NOTE — Assessment & Plan Note (Addendum)
CPAP is set at 10 cm and seems to be working well Use Flonase-1 spray each nare at bedtime Her husband seems to have REM behavior disorder & needs a sleep study- we discussed padding to prevent injuries or moving to a different bed  Weight loss encouraged, compliance with goal of at least 6 hrs every night is the expectation. Advised against medications with sedative side effects Cautioned against driving when sleepy - understanding that sleepiness will vary on a day to day basis

## 2015-12-04 NOTE — Assessment & Plan Note (Signed)
Continue use of mouthguard

## 2015-12-15 ENCOUNTER — Encounter: Payer: Self-pay | Admitting: Family

## 2015-12-15 MED ORDER — DULOXETINE HCL 60 MG PO CPEP: 60.0000 mg | ORAL_CAPSULE | Freq: Every day | ORAL | Status: DC

## 2016-01-01 DIAGNOSIS — M1711 Unilateral primary osteoarthritis, right knee: Secondary | ICD-10-CM | POA: Diagnosis not present

## 2016-01-19 ENCOUNTER — Encounter: Payer: Self-pay | Admitting: Family

## 2016-01-21 IMAGING — MR MR LUMBAR SPINE W/O CM
4 of 5 series · 24 of 48 positions shown · non-contrast
Comparison: None.

CLINICAL DATA: Low back pain and numbness of of the right hip and
leg.

EXAM:
MRI LUMBAR SPINE WITHOUT CONTRAST
TECHNIQUE: Multiplanar, multisequence MR imaging of the lumbar spine was
performed. No intravenous contrast was administered.

[Series 2: T1 · sagittal · 4.0mm · 0.51mm/px · 6 of 15 slices shown (1 of 2)]
[im 1/15]
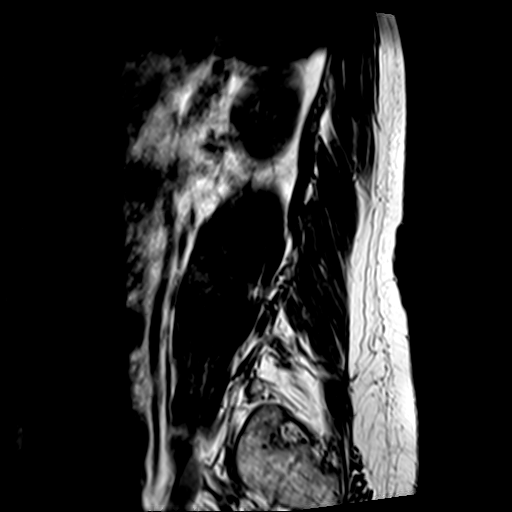
[im 3/15]
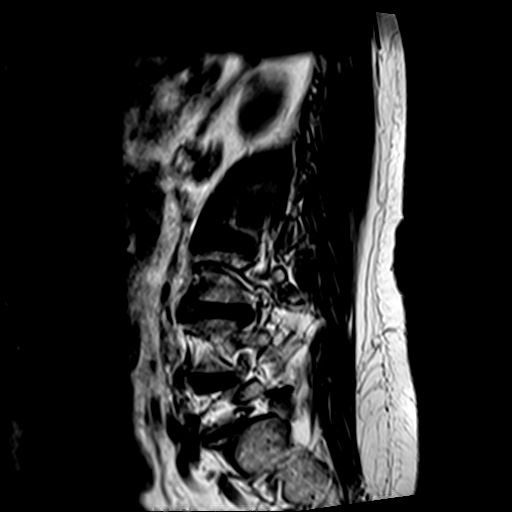
[im 5/15]
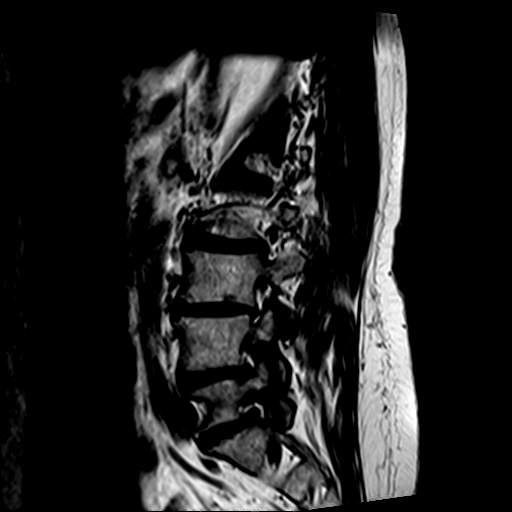
[im 8/15]
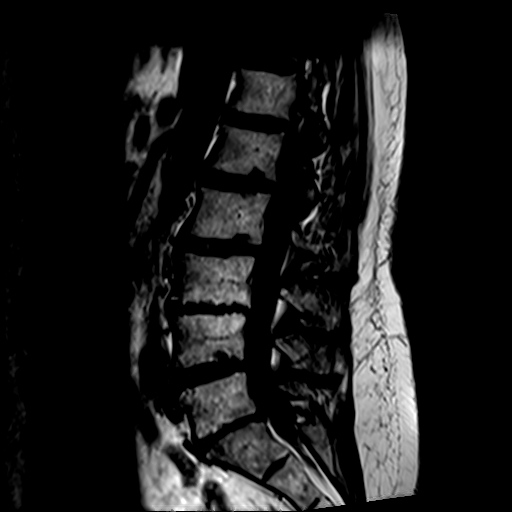
[im 10/15]
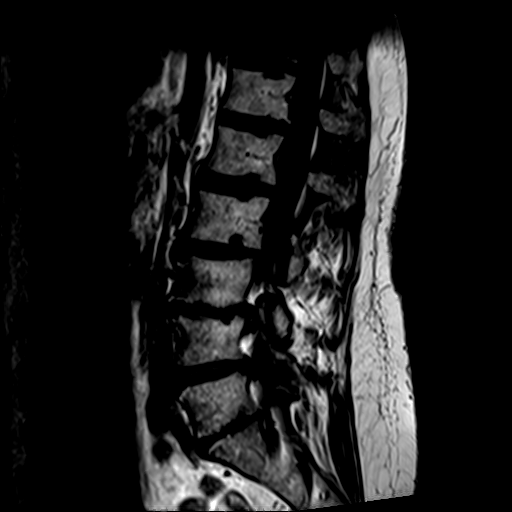
[im 12/15]
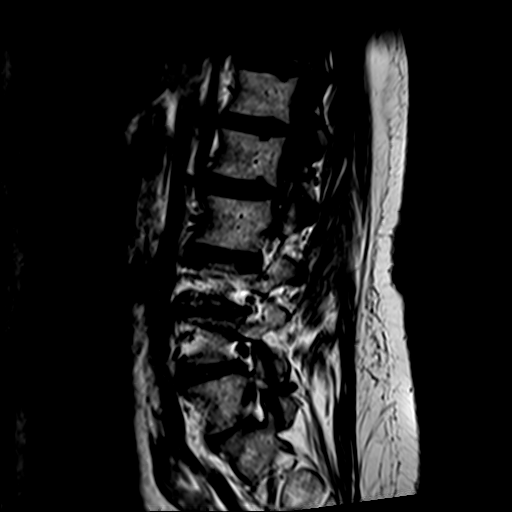

[Series 3: T2 · sagittal · 4.0mm · 0.81mm/px · 7 of 15 slices shown (1 of 2)]
[im 1/15]
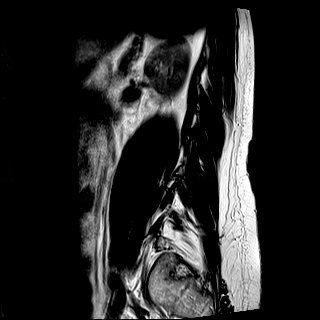
[im 3/15]
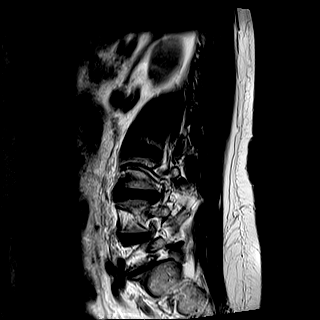
[im 5/15]
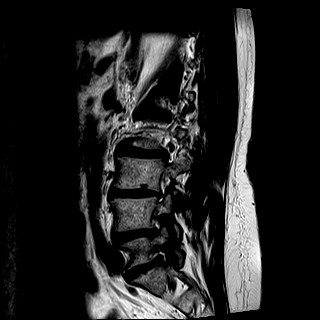
[im 8/15]
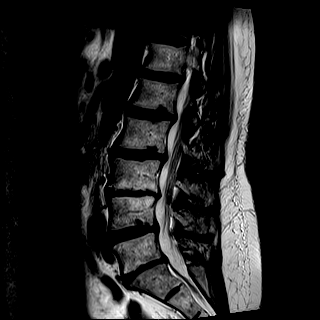
[im 10/15]
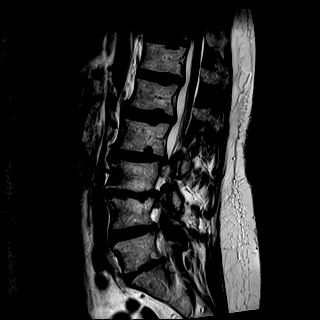
[im 12/15]
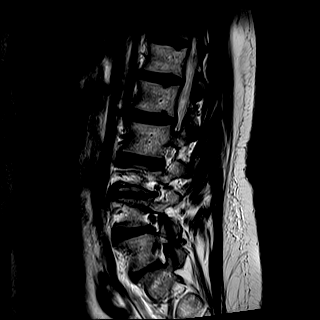
[im 15/15]
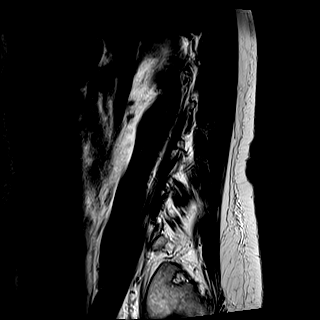

[Series 5: T2 · axial · 4.0mm · 0.39mm/px · z∈[-15,+170]mm · 8 of 34 slices shown (2 of 2)]
[im 1/34]
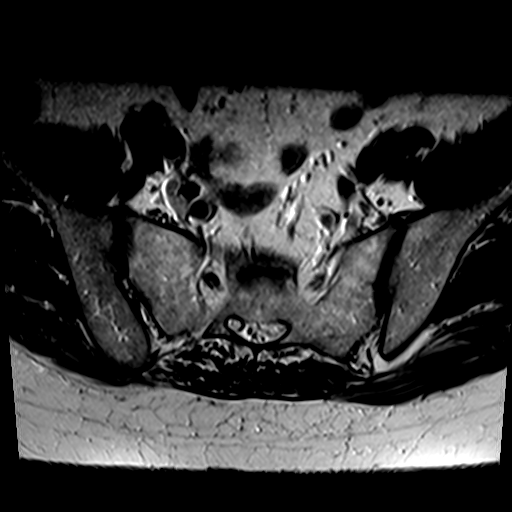
[im 6/34]
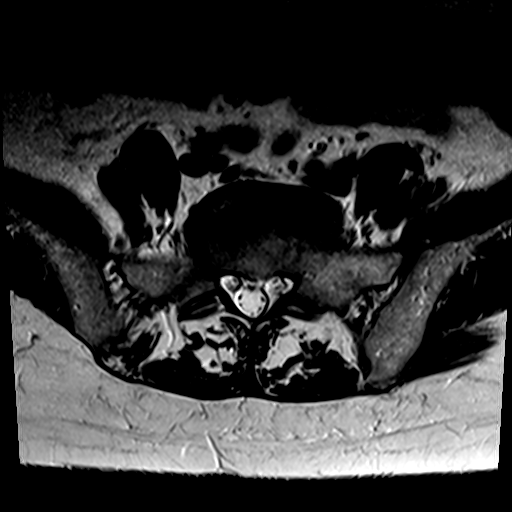
[im 11/34]
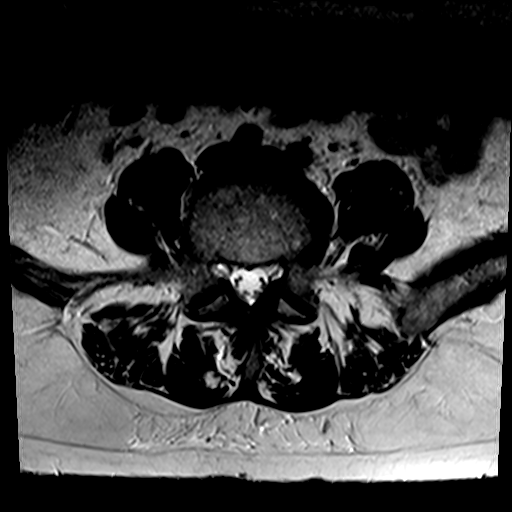
[im 16/34]
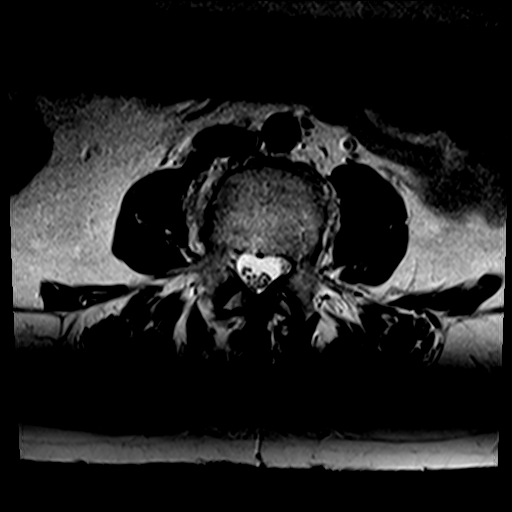
[im 18/34]
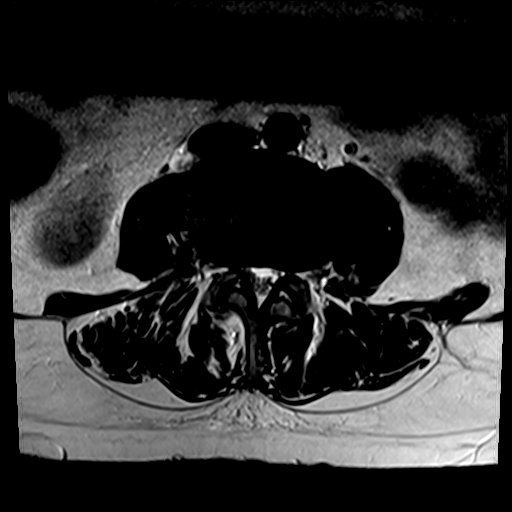
[im 23/34]
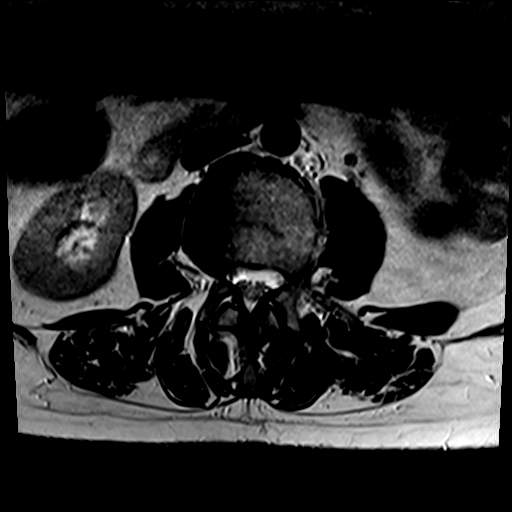
[im 28/34]
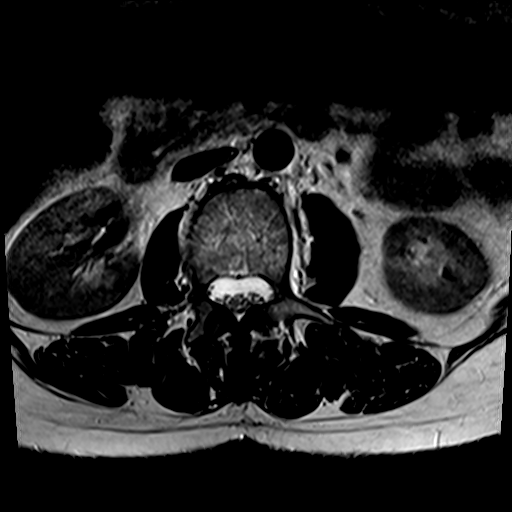
[im 34/34]
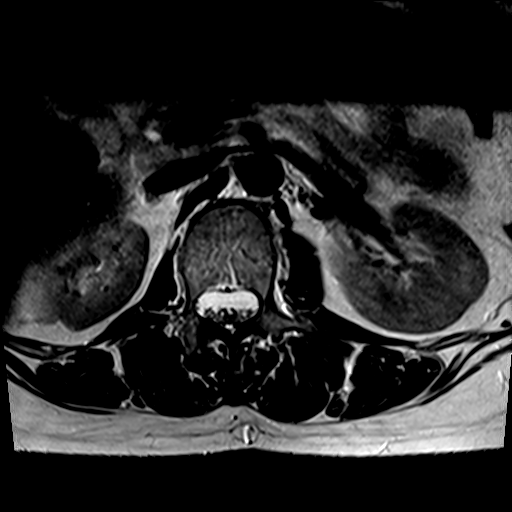

[Series 6: T1 · axial · 4.0mm · 0.78mm/px · z∈[+10,+140]mm · 3 of 34 slices shown (2 of 2)]
[im 6/34]
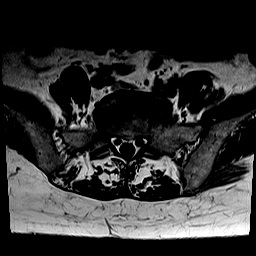
[im 18/34]
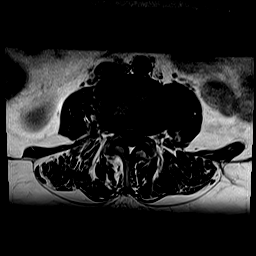
[im 28/34]
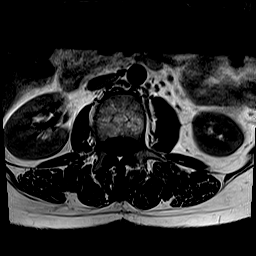

[24 of 48 positions shown; findings below may reference images not displayed]

FINDINGS: The vertebral bodies of the lumbar spine are normal in size. The
vertebral bodies of the lumbar spine are normal in alignment. There
is normal bone marrow signal demonstrated throughout the vertebra.
There is severe degenerative disc disease with disc height loss at
L3-4 and L5-S1. There is disc desiccation throughout the lumbar
spine.

The spinal cord is normal in signal and contour. The cord terminates
normally at L1 . The nerve roots of the cauda equina and the filum
terminale are normal.

The visualized portions of the SI joints are unremarkable.

The imaged intra-abdominal contents are unremarkable.

T12-L1: Mild broad-based disc bulge. No evidence of neural foraminal
stenosis. No central canal stenosis.

L1-L2: Mild broad-based disc bulge flattening the ventral thecal
sac. Mild spinal stenosis. No evidence of neural foraminal stenosis.

L2-L3: Mild broad-based disc bulge flattening the ventral thecal
sac. Mild bilateral facet arthropathy. Mild-moderate spinal
stenosis. No evidence of neural foraminal stenosis.

L3-L4: Eccentric right mild broad-based disc bulge. Mild bilateral
facet arthropathy with ligamentum flavum infolding resulting in
bilateral lateral recess stenosis and mild spinal stenosis. Mild
right foraminal stenosis. No left foraminal stenosis.

L4-L5: Mild broad-based disc bulge. Moderate bilateral facet
arthropathy. Bilateral lateral recess stenosis, left greater than
right. Mild spinal stenosis. No significant foraminal stenosis.

L5-S1: Mild broad-based disc bulge with a left far lateral disc
osteophyte complex. Mild bilateral facet arthropathy. Mild left
foraminal stenosis. No significant right foraminal stenosis. No
central canal stenosis.
IMPRESSION: 1. There is diffuse lumbar spine spondylosis as described above,
most severe at L2-3 and L3-4.
2. At L2-3 there is a mild broad-based disc bulge flattening the
ventral thecal sac. Mild bilateral facet arthropathy. Mild-moderate
spinal stenosis.
3. At L3-4 there is any centric right mild broad-based disc bulge.
Mild bilateral facet arthropathy with ligamentum flavum infolding
resulting in bilateral lateral recess stenosis and mild spinal
stenosis. Mild right foraminal stenosis.

## 2016-01-22 ENCOUNTER — Encounter: Payer: Self-pay | Admitting: Medical

## 2016-01-22 ENCOUNTER — Ambulatory Visit (INDEPENDENT_AMBULATORY_CARE_PROVIDER_SITE_OTHER): Payer: Medicare Other | Admitting: Medical

## 2016-01-22 VITALS — BP 126/80 | HR 87 | Temp 98.1°F | Ht 64.0 in | Wt 163.6 lb

## 2016-01-22 DIAGNOSIS — M5431 Sciatica, right side: Secondary | ICD-10-CM | POA: Diagnosis not present

## 2016-01-22 MED ORDER — CYCLOBENZAPRINE HCL 5 MG PO TABS: 5.0000 mg | ORAL_TABLET | Freq: Every day | ORAL | Status: DC

## 2016-01-22 MED ORDER — PREDNISONE 10 MG PO TABS: ORAL_TABLET | ORAL | Status: DC

## 2016-01-22 NOTE — Progress Notes (Signed)
Subjective:    Patient ID: Vanessa Oliver, female    DOB: 04-02-1951, 65 y.o.   MRN: [id number]  HPI   Pt in with some lower back pain. Last year had surgery. She went to PT. She states seemed to get relief. Pt states last week her pain seemed to start. She applied some voltarn gel last week.helped then. But yesterday pain increased again. Tried voltaren gel but did not help.  Pt is allergic to oxycodone, hyrdocodone, toradol, and nsaids. Pt does not have muscle relaxant on allergy list.  Pt states has had prednisone she does not remember any allergy to that. Pt had cortisone shot in her rt knee and rt hip as well.  Pt has used prednisone last year. Reports no reaction to that.  No leg weakness, no saddle anesthesia and no foot drop.  Some pain rt si that radiates to rt thigh.   Review of Systems  Constitutional: Negative for fever, chills and fatigue.  Respiratory: Negative for cough, chest tightness and wheezing.   Cardiovascular: Negative for chest pain and palpitations.  Gastrointestinal: Negative for nausea, vomiting and abdominal pain.  Genitourinary: Negative for dysuria, urgency, hematuria and flank pain.  Musculoskeletal: Positive for back pain.  Skin: Negative for rash.  Neurological: Negative for weakness and numbness.    Past Medical History  Diagnosis Date  . Arthritis   . Hyperlipidemia   . Hiatal hernia   . Fibromyalgia   . IBS (irritable bowel syndrome)   . Esophageal stricture   . Plantar fasciitis   . Rosacea   . TMJ (temporomandibular joint disorder)   . Vasovagal syncope   . Allergy     takes Singulair at night  . Fibromyalgia   . Depression     takes CYmbalta daily  . GERD (gastroesophageal reflux disease)     takes Omeprazole daily  . Family history of adverse reaction to anesthesia     oldest brother had trouble with anesthesia a long time ago but can't recall what  . History of bronchitis   . Vasovagal episode back in the 80's  .  Weakness     right leg  . Joint pain   . Chronic back pain     stenosis  . Joint swelling   . Hemorrhoids      Social History   Social History  . Marital Status: Married    Spouse Name: N/A  . Number of Children: 0  . Years of Education: N/A   Occupational History  . clerical    Social History Main Topics  . Smoking status: Never Smoker   . Smokeless tobacco: Never Used  . Alcohol Use: 0.0 oz/week    0 Standard drinks or equivalent per week     Comment: rarely  . Drug Use: No  . Sexual Activity: Yes    Birth Control/ Protection: Surgical   Other Topics Concern  . 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 Nissen fundoplication  0000000  . Retinal laser procedure Right 02/01/2011    right eye   . Nasal septum surgery  1970  . Knee arthroscopy Right 1994    x 3  . Foot surgery Left  2006, 2007    mortans neuroma  . Carpal tunnel release Bilateral 2004  . Trigger finger release Bilateral 2005  . Abdominal hysterectomy  1997  . Varicose vein surgery Bilateral 2007, 2009    left 2007, right 2009  . Mallet finger Right 2011  . Vitrectomy Right 2012  . Cataract extraction Right 2013    Dr Ellison Hughs  . Carpometacarpal (cmc) fusion of thumb  2015    thumb  . Tonsillectomy  1981  . Nissen fundoplication    . Colonoscopy    . Colonoscopy with esophagogastroduodenoscopy (egd) and esophageal dilation (ed)    . Eye surgery      scar tissue remove  . Lumbar laminectomy/decompression microdiscectomy Right 02/11/2015    Procedure: Laminectomy and Foraminotomy - Right - L3-L4;  Surgeon: Earnie Larsson, MD;  Location: [facility];  Service: Neurosurgery;  Laterality: Right;  Laminectomy and Foraminotomy - Right - L3-L4    Family History  Problem Relation Age of Onset  .  Breast cancer Mother   . Arthritis Mother   . Hyperlipidemia Mother   . Hypertension Mother   . Lung cancer Father   . Pancreatic cancer Father   . Skin cancer Father   . Arthritis Father   . Celiac disease Brother   . Hyperlipidemia Maternal Grandfather   . Heart disease Maternal Grandfather   . Stroke Maternal Grandfather     Allergies  Allergen Reactions  . Aleve [Naproxen Sodium]     Swelling, itching  . Cefuroxime Axetil     Hives / "throat swelling"  . Chlorzoxazone Rash and Other (See Comments)    "breathing problems"   . Oxycodone     Hallucinations, rash  . Penicillins Anaphylaxis  . Adhesive [Tape]     rash  . Cataflam [Diclofenac Potassium]     Lip swelling  . Clarithromycin Diarrhea  . Flagyl [Metronidazole Hcl]     ?diarrhea  . Glucosamine Hives  . Guaifenesin & Derivatives     Stomach upset  . Hydrocodone-Acetaminophen     REACTION: rash, tightening of throat  . Ketorolac Tromethamine     ?reaction  . Pentazocine     Involuntary twitching  . Pneumovax [Pneumococcal Polysaccharide Vaccine] Other (See Comments)    Redness/swelling at site, nausea  . Prevnar [Pneumococcal 13-Val Conj Vacc] Other (See Comments)    Swelling, redness, nausea  . Smz-Tmp Ds [Sulfamethoxazole W/Trimethoprim (Co-Trimoxazole)]     ?unknown reaction  . Tramadol     constipation  . Trazodone And Nefazodone Other (See Comments)    Scratchy throat / congestion    Current Outpatient Prescriptions on File Prior to Visit  Medication Sig Dispense Refill  . aspirin 81 MG tablet Take 81 mg by mouth daily. Reported on 12/04/2015    . Azelaic Acid (FINACEA) 15 % cream Apply topically 1 day or 1 dose. After skin is thoroughly washed and patted dry, gently but thoroughly massage a thin film of azelaic acid cream into the affected area twice daily, in the morning and evening.     . benzoyl peroxide 5 % gel Apply 1 application topically daily.     . Calcium Carbonate-Vitamin D (CALTRATE  600+D) 600-400 MG-UNIT per tablet Take 1 tablet by mouth 2 (two) times daily. (Patient taking differently: Take 2 tablets by mouth 2 (two) times daily. )    . cholecalciferol (VITAMIN D) 1000 UNITS tablet Take 1,000 Units by mouth daily.     . DULoxetine (CYMBALTA) 60 MG capsule  Take 1 capsule (60 mg total) by mouth daily. 30 capsule 3  . EPINEPHrine (EPIPEN 2-PAK) 0.3 mg/0.3 mL IJ SOAJ injection Inject 0.3 mLs (0.3 mg total) into the muscle once. 1 Device 1  . FIBER PO Take 2 capsules by mouth 2 (two) times daily.    . Multiple Vitamin (MULTIVITAMIN) tablet Take 1 tablet by mouth daily.    Marland Kitchen omeprazole (PRILOSEC) 40 MG capsule Take 1 capsule (40 mg total) by mouth 2 (two) times daily. 180 capsule 1  . pravastatin (PRAVACHOL) 20 MG tablet Take 1 tablet (20 mg total) by mouth at bedtime. 90 tablet 1  . Probiotic Product (PROBIOTIC DAILY PO) Take 1 tablet by mouth daily.    . vitamin C (ASCORBIC ACID) 500 MG tablet Take 500 mg by mouth daily.    . VOLTAREN 1 % GEL APPLY 2 GRAMS TOPICALLY TWICE A DAY AS NEEDED 300 g 1   No current facility-administered medications on file prior to visit.    BP 126/80 mmHg  Pulse 87  Temp(Src) 98.1 F (36.7 C) (Oral)  Ht 5\' 4"  (1.626 m)  Wt 163 lb 9.6 oz (74.208 kg)  BMI 28.07 kg/m2  SpO2 98%  LMP 05/14/1996       Objective:   Physical Exam    General Appearance- Not in acute distress.    Chest and Lung Exam Auscultation: Breath sounds:-Normal. Clear even and unlabored. Adventitious sounds:- No Adventitious sounds.  Cardiovascular Auscultation:Rythm - Regular, rate and rythm. Heart Sounds -Normal heart sounds.  Abdomen Inspection:-Inspection Normal.  Palpation/Perucssion: Palpation and Percussion of the abdomen reveal- Non Tender, No Rebound tenderness, No rigidity(Guarding) and No Palpable abdominal masses.  Liver:-Normal.  Spleen:- Normal.   Back No  Mid lumbar spine tenderness to palpation. Rt si tenderness directly NoPain on  straight leg lift. Mild Pain on lateral movements and flexion/extension of the spine.  Lower ext neurologic  L5-S1 sensation intact bilaterally. Normal patellar reflexes bilaterally. No foot drop bilaterally.      Assessment & Plan:  For your back pain with sciatica features will rx taper prednisone and flexeril. You can continue voltaren gel.  Stretching exercises as tolerated. Maybe wait 2-3 days before starring.  If symptoms worsen as discussed then ED evaluation.  If symptoms not improving consider PT or referral back to your specialist.  Follow up in 7-10 days or as needed   Dayveon Halley, Percell Miller, [employer]

## 2016-01-22 NOTE — Patient Instructions (Addendum)
For your back pain with sciatica features will rx taper prednisone and flexeril. You can continue voltaren gel.  Stretching exercises as tolerated. Maybe wait 2-3 days before starring.  If symptoms worsen as discussed then ED evaluation.  If symptoms not improving consider PT or referral back to your specialist.  Follow up in 7-10 days or as needed

## 2016-01-22 NOTE — Progress Notes (Signed)
Pre visit review using our clinic review tool, if applicable. No additional management support is needed unless otherwise documented below in the visit note. 

## 2016-01-25 ENCOUNTER — Encounter: Payer: Self-pay | Admitting: Family

## 2016-02-09 ENCOUNTER — Other Ambulatory Visit: Payer: Self-pay | Admitting: Family

## 2016-02-09 DIAGNOSIS — Z1231 Encounter for screening mammogram for malignant neoplasm of breast: Secondary | ICD-10-CM

## 2016-02-17 ENCOUNTER — Ambulatory Visit (INDEPENDENT_AMBULATORY_CARE_PROVIDER_SITE_OTHER): Payer: Medicare Other | Admitting: Family

## 2016-02-17 ENCOUNTER — Encounter: Payer: Self-pay | Admitting: Family

## 2016-02-17 DIAGNOSIS — M797 Fibromyalgia: Secondary | ICD-10-CM

## 2016-02-17 DIAGNOSIS — F341 Dysthymic disorder: Secondary | ICD-10-CM

## 2016-02-17 MED ORDER — OMEPRAZOLE 40 MG PO CPDR: 40.0000 mg | DELAYED_RELEASE_CAPSULE | Freq: Two times a day (BID) | ORAL | 1 refills | Status: DC

## 2016-02-17 MED ORDER — DULOXETINE HCL 60 MG PO CPEP: 60.0000 mg | ORAL_CAPSULE | Freq: Every day | ORAL | 5 refills | Status: DC

## 2016-02-17 MED ORDER — FLUTICASONE PROPIONATE 50 MCG/ACT NA SUSP
1.0000 | Freq: Every day | NASAL | 5 refills | Status: DC
Start: 2016-02-17 — End: 2016-02-17

## 2016-02-17 MED ORDER — FLUTICASONE PROPIONATE 50 MCG/ACT NA SUSP: 1.0000 | Freq: Every day | NASAL | 5 refills | Status: DC

## 2016-02-17 MED ORDER — PRAVASTATIN SODIUM 20 MG PO TABS: 20.0000 mg | ORAL_TABLET | Freq: Every day | ORAL | 1 refills | Status: DC

## 2016-02-17 NOTE — Progress Notes (Signed)
Subjective:    Patient ID: Vanessa Oliver, female    DOB: 1951/04/19, 65 y.o.   MRN: [id number]  HPI  Vanessa Oliver is a 65 yr old female who presents today for follow up of anxiety and depression. She initially wanted to try to come off of the cymbalta but did not tolerated the wean. Was placed back on 60mg  daily of cymbalta. Since restarting she notes improvement in her anxiety as well as some improvement in her fibro pain. Denies panic attacks.  Mood is good.  Having some HA's but she is following up with opthalmology.    Review of Systems She reports that her itching has subsided.  She is taking claritin    Past Medical History:  Diagnosis Date  . Allergy    takes Singulair at night  . Arthritis   . Chronic back pain    stenosis  . Depression    takes CYmbalta daily  . Esophageal stricture   . Family history of adverse reaction to anesthesia    oldest brother had trouble with anesthesia a long time ago but can't recall what  . Fibromyalgia   . Fibromyalgia   . GERD (gastroesophageal reflux disease)    takes Omeprazole daily  . Hemorrhoids   . Hiatal hernia   . History of bronchitis   . Hyperlipidemia   . IBS (irritable bowel syndrome)   . Joint pain   . Joint swelling   . Plantar fasciitis   . Rosacea   . TMJ (temporomandibular joint disorder)   . Vasovagal episode back in the 80's  . Vasovagal syncope   . Weakness    right leg     Social History   Social History  . Marital status: Married    Spouse name: N/A  . Number of children: 0  . Years of education: N/A   Occupational History  . clerical Yp [employer] History Main Topics  . Smoking status: Never Smoker  . Smokeless tobacco: Never Used  . Alcohol use 0.0 oz/week     Comment: rarely  . Drug use: No  . Sexual activity: Yes    Birth control/ protection: Surgical   Other Topics Concern  . Not on file   Social History Narrative   Works as an Glass blower/designer   Married- husband is 65  years older than her   Former Dance movement psychotherapist up up [employer]   Has cats   Enjoys reading   No children    Past Surgical History:  Procedure Laterality Date  . ABDOMINAL HYSTERECTOMY  1997  . CARPAL TUNNEL RELEASE Bilateral 2004  . CARPOMETACARPAL (CMC) FUSION OF THUMB  2015   thumb  . CATARACT EXTRACTION Right 2013   Dr Ellison Hughs  . COLONOSCOPY    . COLONOSCOPY WITH ESOPHAGOGASTRODUODENOSCOPY (EGD) AND ESOPHAGEAL DILATION (ED)    . EYE SURGERY     scar tissue remove  . 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 KNEE ARTHROSCOPY Right 1994   x 3  . LUMBAR LAMINECTOMY/DECOMPRESSION MICRODISCECTOMY Right 02/11/2015   Procedure: Laminectomy and Foraminotomy - Right - L3-L4;  Surgeon: Earnie Larsson, MD;  Location: [facility];  Service: Neurosurgery;  Laterality: Right;  Laminectomy and Foraminotomy - Right - L3-L4  . mallet finger Right 2011  . NASAL SEPTUM SURGERY  1970  . NISSEN FUNDOPLICATION  0000000  . NISSEN FUNDOPLICATION    . RETINAL LASER PROCEDURE Right 02/01/2011   right  eye   . TONSILLECTOMY  1981  . TRIGGER FINGER RELEASE Bilateral 2005  . VARICOSE VEIN SURGERY Bilateral 2007, 2009   left 2007, right 2009  . VITRECTOMY Right 2012    Family History  Problem Relation Age of Onset  . Breast cancer Mother   . Arthritis Mother   . Hyperlipidemia Mother   . Hypertension Mother   . Lung cancer Father   . Pancreatic cancer Father   . Skin cancer Father   . Arthritis Father   . Celiac disease Brother   . Hyperlipidemia Maternal Grandfather   . Heart disease Maternal Grandfather   . Stroke Maternal Grandfather     Allergies  Allergen Reactions  . Aleve [Naproxen Sodium]     Swelling, itching  . Cefuroxime Axetil     Hives / "throat swelling"  . Chlorzoxazone Rash and Other (See Comments)    "breathing problems"   . Oxycodone     Hallucinations, rash  . Penicillins Anaphylaxis  . Adhesive [Tape]     rash  . Cataflam [Diclofenac Potassium]     Lip swelling    . Clarithromycin Diarrhea  . Flagyl [Metronidazole Hcl]     ?diarrhea  . Glucosamine Hives  . Guaifenesin & Derivatives     Stomach upset  . Hydrocodone-Acetaminophen     REACTION: rash, tightening of throat  . Ketorolac Tromethamine     ?reaction  . Pentazocine     Involuntary twitching  . Pneumovax [Pneumococcal Polysaccharide Vaccine] Other (See Comments)    Redness/swelling at site, nausea  . Prevnar [Pneumococcal 13-Val Conj Vacc] Other (See Comments)    Swelling, redness, nausea  . Smz-Tmp Ds [Sulfamethoxazole W/Trimethoprim (Co-Trimoxazole)]     ?unknown reaction  . Tramadol     constipation  . Trazodone And Nefazodone Other (See Comments)    Scratchy throat / congestion    Current Outpatient Prescriptions on File Prior to Visit  Medication Sig Dispense Refill  . aspirin 81 MG tablet Take 81 mg by mouth daily. Reported on 12/04/2015    . Azelaic Acid (FINACEA) 15 % cream Apply topically 1 day or 1 dose. After skin is thoroughly washed and patted dry, gently but thoroughly massage a thin film of azelaic acid cream into the affected area twice daily, in the morning and evening.     . benzoyl peroxide 5 % gel Apply 1 application topically daily.     . Calcium Carbonate-Vitamin D (CALTRATE 600+D) 600-400 MG-UNIT per tablet Take 1 tablet by mouth 2 (two) times daily. (Patient taking differently: Take 2 tablets by mouth 2 (two) times daily. )    . cholecalciferol (VITAMIN D) 1000 UNITS tablet Take 1,000 Units by mouth daily.     . cyclobenzaprine (FLEXERIL) 5 MG tablet Take 1 tablet (5 mg total) by mouth at bedtime. 7 tablet 0  . DULoxetine (CYMBALTA) 60 MG capsule Take 1 capsule (60 mg total) by mouth daily. 30 capsule 3  . EPINEPHrine (EPIPEN 2-PAK) 0.3 mg/0.3 mL IJ SOAJ injection Inject 0.3 mLs (0.3 mg total) into the muscle once. 1 Device 1  . FIBER PO Take 2 capsules by mouth 2 (two) times daily.    . Multiple Vitamin (MULTIVITAMIN) tablet Take 1 tablet by mouth daily.    Marland Kitchen  omeprazole (PRILOSEC) 40 MG capsule Take 1 capsule (40 mg total) by mouth 2 (two) times daily. 180 capsule 1  . pravastatin (PRAVACHOL) 20 MG tablet Take 1 tablet (20 mg total) by mouth at bedtime. [place]  tablet 1  . predniSONE (DELTASONE) 10 MG tablet 5 tab po day 1, 4 tab po day 2, 3 tab po day 3, 2 tab po day 4, and 1 tab po day 5. 15 tablet 0  . Probiotic Product (PROBIOTIC DAILY PO) Take 1 tablet by mouth daily.    . vitamin C (ASCORBIC ACID) 500 MG tablet Take 500 mg by mouth daily.    . VOLTAREN 1 % GEL APPLY 2 GRAMS TOPICALLY TWICE A DAY AS NEEDED 300 g 1   No current facility-administered medications on file prior to visit.     BP 126/86   Pulse 91   Temp 98.1 F (36.7 C)   Ht 5\' 4"  (1.626 m)   Wt 164 lb 6.4 oz (74.6 kg)   LMP 05/14/1996   SpO2 98%   BMI 28.22 kg/m    Objective:   Physical Exam  Constitutional: She is oriented to person, place, and time. She appears well-developed and well-nourished.  HENT:  Head: Normocephalic and atraumatic.  Cardiovascular: Normal rate, regular rhythm and normal heart sounds.   No murmur heard. Pulmonary/Chest: Effort normal and breath sounds normal. No respiratory distress. She has no wheezes.  Musculoskeletal: She exhibits no edema.  Neurological: She is alert and oriented to person, place, and time.  Skin: Skin is warm and dry.  Psychiatric: She has a normal mood and affect. Her behavior is normal. Judgment and thought content normal.          Assessment & Plan:

## 2016-02-17 NOTE — Assessment & Plan Note (Signed)
Stable/improved back on cymbalta. Continue same.

## 2016-02-17 NOTE — Progress Notes (Signed)
Pre visit review using our clinic review tool, if applicable. No additional management support is needed unless otherwise documented below in the visit note. 

## 2016-02-17 NOTE — Assessment & Plan Note (Signed)
Stable on cymbalta, continue same.   

## 2016-02-25 DIAGNOSIS — Z0100 Encounter for examination of eyes and vision without abnormal findings: Secondary | ICD-10-CM | POA: Diagnosis not present

## 2016-02-25 DIAGNOSIS — H2513 Age-related nuclear cataract, bilateral: Secondary | ICD-10-CM | POA: Diagnosis not present

## 2016-03-03 ENCOUNTER — Encounter: Payer: Self-pay | Admitting: Family

## 2016-03-03 MED ORDER — FLUTICASONE PROPIONATE 50 MCG/ACT NA SUSP: 1.0000 | Freq: Every day | NASAL | 1 refills | Status: DC

## 2016-03-03 MED ORDER — DULOXETINE HCL 60 MG PO CPEP: 60.0000 mg | ORAL_CAPSULE | Freq: Every day | ORAL | 1 refills | Status: DC

## 2016-03-04 DIAGNOSIS — M4806 Spinal stenosis, lumbar region: Secondary | ICD-10-CM | POA: Diagnosis not present

## 2016-03-17 DIAGNOSIS — M1711 Unilateral primary osteoarthritis, right knee: Secondary | ICD-10-CM | POA: Diagnosis not present

## 2016-03-18 NOTE — Progress Notes (Signed)
Pre visit review using our clinic review tool, if applicable. No additional management support is needed unless otherwise documented below in the visit note. 

## 2016-03-18 NOTE — Progress Notes (Signed)
Subjective:   Vanessa Oliver is a 65 y.o. female who presents for an Initial Medicare Annual Wellness Visit.  Review of Systems    No ROS.  Medicare Wellness Visit.   Cardiac Risk Factors include: advanced age (>65men, >63 women);dyslipidemia Sleep patterns:   Sleeps about 4 hours at night, naps during the day. Uses CPAP, up to bathroom x 1.  Home Safety/Smoke Alarms:  Lives with husband and mother. Both have health issues. Feels safe at home. Smoke detectors in place.  Living environment; residence and Firearm Safety: No firearms. Seat Belt Safety/Bike Helmet: Wears seatbelt.    Counseling:   Eye Exam- Cataract specialist in 2 weeks. Has yearly exams with [facility]-Follows dentist every 6 months.   Female:   Pap-N/A  Hysterectomy    Mammo-04/07/2015-normal. Scheduled for October 2017.     Dexa scan-10/14/14-Osteopenia. Taking Calcium and Vitamin D. Recommended repeat in 2 years.        CCS-02/26/08-Normal, recall 10 years.       Objective:    Today's Vitals   03/19/16 1418  BP: 132/70  Pulse: 89  SpO2: 98%  Weight: 168 lb 6.4 oz (76.4 kg)  Height: 5\' 4"  (1.626 m)   Body mass index is 28.91 kg/m.   Current Medications (verified) Outpatient Encounter Prescriptions as of 03/19/2016  Medication Sig  . aspirin 81 MG tablet Take 81 mg by mouth daily. Reported on 12/04/2015  . Azelaic Acid (FINACEA) 15 % cream Apply topically 1 day or 1 dose. After skin is thoroughly washed and patted dry, gently but thoroughly massage a thin film of azelaic acid cream into the affected area twice daily, in the morning and evening.   . benzoyl peroxide 5 % gel Apply 1 application topically daily.   . Calcium Carbonate-Vitamin D (CALTRATE 600+D) 600-400 MG-UNIT per tablet Take 1 tablet by mouth 2 (two) times daily. (Patient taking differently: Take 2 tablets by mouth 2 (two) times daily. )  . cholecalciferol (VITAMIN D) 1000 UNITS tablet Take 1,000 Units by mouth daily.   .  DULoxetine (CYMBALTA) 60 MG capsule Take 1 capsule (60 mg total) by mouth daily.  Marland Kitchen EPINEPHrine (EPIPEN 2-PAK) 0.3 mg/0.3 mL IJ SOAJ injection Inject 0.3 mLs (0.3 mg total) into the muscle once.  Marland Kitchen FIBER PO Take 2 capsules by mouth 2 (two) times daily.  . fluticasone (FLONASE) 50 MCG/ACT nasal spray Place 1 spray into both nostrils daily.  Marland Kitchen loratadine (CLARITIN) 10 MG tablet Take 10 mg by mouth daily as needed for allergies.  . Multiple Vitamin (MULTIVITAMIN) tablet Take 1 tablet by mouth daily.  Marland Kitchen omeprazole (PRILOSEC) 40 MG capsule Take 1 capsule (40 mg total) by mouth 2 (two) times daily.  . pravastatin (PRAVACHOL) 20 MG tablet Take 1 tablet (20 mg total) by mouth at bedtime. (Patient taking differently: Take 10 mg by mouth at bedtime. )  . Probiotic Product (PROBIOTIC DAILY PO) Take 1 tablet by mouth daily.  . vitamin C (ASCORBIC ACID) 500 MG tablet Take 500 mg by mouth daily.  . VOLTAREN 1 % GEL APPLY 2 GRAMS TOPICALLY TWICE A DAY AS NEEDED  . cyclobenzaprine (FLEXERIL) 5 MG tablet Take 1 tablet (5 mg total) by mouth at bedtime. (Patient not taking: Reported on 03/19/2016)  . predniSONE (DELTASONE) 10 MG tablet 5 tab po day 1, 4 tab po day 2, 3 tab po day 3, 2 tab po day 4, and 1 tab po day 5. (Patient not taking: Reported on 03/19/2016)  No facility-administered encounter medications on file as of 03/19/2016.     Allergies (verified) Aleve [naproxen sodium]; Cefuroxime axetil; Chlorzoxazone; Oxycodone; Penicillins; Adhesive [tape]; Cataflam [diclofenac potassium]; Clarithromycin; Flagyl [metronidazole hcl]; Glucosamine; Guaifenesin & derivatives; Hydrocodone-acetaminophen; Ketorolac tromethamine; Pentazocine; Pneumovax [pneumococcal polysaccharide vaccine]; Prevnar [pneumococcal 13-val conj vacc]; Smz-tmp ds [sulfamethoxazole w/trimethoprim (co-trimoxazole)]; Tramadol; and Trazodone and nefazodone   History: Past Medical History:  Diagnosis Date  . Allergy    takes Singulair at night  .  Arthritis   . Chronic back pain    stenosis  . Depression    takes CYmbalta daily  . Esophageal stricture   . Family history of adverse reaction to anesthesia    oldest brother had trouble with anesthesia a long time ago but can't recall what  . Fibromyalgia   . Fibromyalgia   . GERD (gastroesophageal reflux disease)    takes Omeprazole daily  . Hemorrhoids   . Hiatal hernia   . History of bronchitis   . Hyperlipidemia   . IBS (irritable bowel syndrome)   . Joint pain   . Joint swelling   . Plantar fasciitis   . Rosacea   . TMJ (temporomandibular joint disorder)   . Vasovagal episode back in the 80's  . Vasovagal syncope   . Weakness    right leg   Past Surgical History:  Procedure Laterality Date  . ABDOMINAL HYSTERECTOMY  1997  . CARPAL TUNNEL RELEASE Bilateral 2004  . CARPOMETACARPAL (CMC) FUSION OF THUMB  2015   thumb  . CATARACT EXTRACTION Right 2013   Dr Ellison Hughs  . COLONOSCOPY    . COLONOSCOPY WITH ESOPHAGOGASTRODUODENOSCOPY (EGD) AND ESOPHAGEAL DILATION (ED)    . EYE SURGERY     scar tissue remove  . 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 KNEE ARTHROSCOPY Right 1994   x 3  . LUMBAR LAMINECTOMY/DECOMPRESSION MICRODISCECTOMY Right 02/11/2015   Procedure: Laminectomy and Foraminotomy - Right - L3-L4;  Surgeon: Earnie Larsson, MD;  Location: [facility];  Service: Neurosurgery;  Laterality: Right;  Laminectomy and Foraminotomy - Right - L3-L4  . mallet finger Right 2011  . NASAL SEPTUM SURGERY  1970  . NISSEN FUNDOPLICATION  0000000  . NISSEN FUNDOPLICATION    . RETINAL LASER PROCEDURE Right 02/01/2011   right eye   . TONSILLECTOMY  1981  . TRIGGER FINGER RELEASE Bilateral 2005  . VARICOSE VEIN SURGERY Bilateral 2007, 2009   left 2007, right 2009  . VITRECTOMY Right 2012   Family History  Problem Relation Age of Onset  . Breast cancer Mother   . Arthritis Mother   . Hyperlipidemia Mother   . Hypertension Mother   . Lung cancer Father   . Pancreatic  cancer Father   . Skin cancer Father   . Arthritis Father   . Hyperlipidemia Maternal Grandfather   . Heart disease Maternal Grandfather   . Stroke Maternal Grandfather   . Celiac disease Brother    Social History   Occupational History  . 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 Smoking status: Never Smoker  . Smokeless tobacco: Never Used  . Alcohol use 0.0 oz/week     Comment: rarely  . Drug use: No  . Sexual activity: Yes    Birth control/ protection: Surgical    Tobacco Counseling Counseling given: Not Answered   Activities of Daily Living In your present state of health, do you have any difficulty performing the following activities: 03/19/2016  Hearing?  N  Vision? N  Difficulty concentrating or making decisions? N  Walking or climbing stairs? N  Dressing or bathing? N  Doing errands, shopping? N  Preparing Food and eating ? N  Using the Toilet? N  In the past six months, have you accidently leaked urine? N  Do you have problems with loss of bowel control? N  Managing your Medications? N  Managing your Finances? N  Housekeeping or managing your Housekeeping? N  Some recent data might be hidden    Immunizations and Health Maintenance Immunization History  Administered Date(s) Administered  . Influenza, High Dose Seasonal PF 03/19/2016  . Influenza,inj,Quad PF,36+ Mos 03/14/2015  . Influenza-Unspecified 03/23/2014  . Pneumococcal Conjugate-13 09/23/2014  . Td 02/16/2011  . Zoster 08/08/2009   Health Maintenance Due  Topic Date Due  . INFLUENZA VACCINE  02/10/2016    Patient Care Team: Debbrah Alar, NP as PCP - General (Internal Medicine) Gaynelle Arabian, MD as Consulting Physician (Orthopedic Surgery) Earnie Larsson, MD as Consulting Physician (Neurosurgery) Laurence Aly, OD as Consulting Physician (Optometry) Wylene Simmer, MD as Consulting Physician (Orthopedic Surgery) Susa Day, MD as Consulting Physician (Orthopedic Surgery) Rigoberto Noel, MD as Consulting Physician (Pulmonary Disease)  Indicate any recent Medical Services you may have received from other than [facility] providers in the past year (date may be approximate).     Assessment:   This is a routine wellness examination for Merelene. Physical assessment deferred to PCP.   Hearing/Vision screen  Hearing Screening   125Hz  250Hz  500Hz  1000Hz  2000Hz  3000Hz  4000Hz  6000Hz  8000Hz   Right ear:   Fail Pass Pass  Pass    Left ear:   Pass Pass Pass  Pass    Comments: Able to hear conversational tones. No issues reported.    Visual Acuity Screening   Right eye Left eye Both eyes  Without correction:     With correction: 20/20 20/20 20/20     Dietary issues and exercise activities discussed: Current Exercise Habits: The patient does not participate in regular exercise at present, Type of exercise: walking, Exercise limited by: orthopedic condition(s). Patient follows work out recommended by physical therapist.  Diet (meal preparation, eat out, water intake, caffeinated beverages, dairy products, fruits and vegetables): juice, 1 Mountain Dew, unsweet tea, water Breakfast:Rasin toast with Pb, cereal with fruit Lunch: Cheese, Yogurt with coconut, fruit cup Dinner: Sandwich (BLTs), chips, meatloaf, veggies, potatoes  Goals    . Healthy lifestyle    . Increase physical activity      Depression Screen PHQ 2/9 Scores 03/19/2016 11/17/2015 07/16/2014  PHQ - 2 Score 0 0 2  PHQ- 9 Score - - 7    Fall Risk Fall Risk  03/19/2016 11/17/2015 07/16/2014  Falls in the past year? Yes Yes No  Number falls in past yr: 1 1 -  Injury with Fall? No No -    Cognitive Function: MMSE - Mini Mental State Exam 03/19/2016  Orientation to time 5  Orientation to Place 5  Registration 3  Attention/ Calculation 5  Recall 3  Language- name 2 objects 2  Language- repeat 1  Language- follow 3 step command 3  Language- read & follow direction 1  Write a sentence 1  Copy design 1  Total score 30      Screening Tests Health Maintenance  Topic Date Due  . INFLUENZA VACCINE  02/10/2016  . MAMMOGRAM  04/06/2017  . COLONOSCOPY  02/25/2018  . TETANUS/TDAP  02/15/2021  . DEXA SCAN  Completed  .  ZOSTAVAX  Completed  . Hepatitis C Screening  Completed      Plan:     Continue to eat heart healthy diet (full of fruits, vegetables, whole grains, lean protein, water--limit salt, fat, and sugar intake) and increase physical activity as tolerated.  Continue doing brain stimulating activities (puzzles, reading, adult coloring books, staying active) to keep memory sharp.   Follow up with Debbrah Alar as scheduled.   Patient states she had knee injected on 03/18/16 for pain by Ortho.   During the course of the visit, Janellys was educated and counseled about the following appropriate screening and preventive services:   Vaccines to include Pneumoccal, Influenza, Hepatitis B, Td, Zostavax, HCV  Cardiovascular disease screening  Colorectal cancer screening  Bone density screening  Diabetes screening  Glaucoma screening  Mammography/PAP  Nutrition counseling  Patient Instructions (the written plan) were given to the patient.    Gerilyn Nestle, RN   03/19/2016

## 2016-03-19 ENCOUNTER — Ambulatory Visit (INDEPENDENT_AMBULATORY_CARE_PROVIDER_SITE_OTHER): Payer: Medicare Other | Admitting: *Deleted

## 2016-03-19 ENCOUNTER — Encounter: Payer: Self-pay | Admitting: *Deleted

## 2016-03-19 VITALS — BP 132/70 | HR 89 | Ht 64.0 in | Wt 168.4 lb

## 2016-03-19 DIAGNOSIS — Z23 Encounter for immunization: Secondary | ICD-10-CM

## 2016-03-19 DIAGNOSIS — Z0000 Encounter for general adult medical examination without abnormal findings: Secondary | ICD-10-CM

## 2016-03-19 DIAGNOSIS — F341 Dysthymic disorder: Secondary | ICD-10-CM | POA: Diagnosis not present

## 2016-03-19 NOTE — Patient Instructions (Signed)
Continue to eat heart healthy diet (full of fruits, vegetables, whole grains, lean protein, water--limit salt, fat, and sugar intake) and increase physical activity as tolerated.  Continue doing brain stimulating activities (puzzles, reading, adult coloring books, staying active) to keep memory sharp.   Follow up with Debbrah Alar as scheduled.

## 2016-03-19 NOTE — Assessment & Plan Note (Signed)
Patient has interest in speaking with counselor regarding childhood trauma. PHQ-2 today=0. Provided patient with list of local Psychologist/Psychiatrist.

## 2016-03-19 NOTE — Progress Notes (Signed)
Noted and agree. 

## 2016-03-25 DIAGNOSIS — M1711 Unilateral primary osteoarthritis, right knee: Secondary | ICD-10-CM | POA: Diagnosis not present

## 2016-03-30 DIAGNOSIS — H25012 Cortical age-related cataract, left eye: Secondary | ICD-10-CM | POA: Diagnosis not present

## 2016-03-30 DIAGNOSIS — H2512 Age-related nuclear cataract, left eye: Secondary | ICD-10-CM | POA: Diagnosis not present

## 2016-03-30 DIAGNOSIS — Z961 Presence of intraocular lens: Secondary | ICD-10-CM | POA: Diagnosis not present

## 2016-03-30 DIAGNOSIS — H02839 Dermatochalasis of unspecified eye, unspecified eyelid: Secondary | ICD-10-CM | POA: Diagnosis not present

## 2016-03-31 DIAGNOSIS — M1711 Unilateral primary osteoarthritis, right knee: Secondary | ICD-10-CM | POA: Diagnosis not present

## 2016-04-11 HISTORY — PX: CATARACT EXTRACTION: SUR2

## 2016-04-16 DIAGNOSIS — Z0100 Encounter for examination of eyes and vision without abnormal findings: Secondary | ICD-10-CM | POA: Diagnosis not present

## 2016-04-16 DIAGNOSIS — H2512 Age-related nuclear cataract, left eye: Secondary | ICD-10-CM | POA: Diagnosis not present

## 2016-04-16 DIAGNOSIS — H2513 Age-related nuclear cataract, bilateral: Secondary | ICD-10-CM | POA: Diagnosis not present

## 2016-04-20 ENCOUNTER — Ambulatory Visit (HOSPITAL_BASED_OUTPATIENT_CLINIC_OR_DEPARTMENT_OTHER): Payer: Medicare Other

## 2016-04-22 ENCOUNTER — Ambulatory Visit (HOSPITAL_BASED_OUTPATIENT_CLINIC_OR_DEPARTMENT_OTHER)
Admission: RE | Admit: 2016-04-22 | Discharge: 2016-04-22 | Disposition: A | Discharge status: Home / Self Care | Payer: Medicare Other | Source: Ambulatory Visit | Attending: Family | Admitting: Family

## 2016-04-22 DIAGNOSIS — Z1231 Encounter for screening mammogram for malignant neoplasm of breast: Secondary | ICD-10-CM

## 2016-05-07 ENCOUNTER — Telehealth: Payer: Self-pay | Admitting: *Deleted

## 2016-05-07 DIAGNOSIS — I700 Atherosclerosis of aorta: Secondary | ICD-10-CM | POA: Insufficient documentation

## 2016-05-07 NOTE — Telephone Encounter (Signed)
Problem list updated as directed.

## 2016-05-07 NOTE — Telephone Encounter (Signed)
-----   Message from Debbrah Alar, NP sent at 05/07/2016  2:58 PM EDT ----- Regarding: RE: RAF Opportunity Yes please.  ----- Message ----- From: Dorrene German, RN Sent: 05/07/2016   2:06 PM To: Debbrah Alar, NP Subject: RAF Opportunity                                Per chart review, pt has atherosclerotic calcifications of arterial vasculature based on 01/08/15 CT abdomen.  Can current diagnosis of aortic atherosclerosis (I70.0) be added to problem list?   Thanks, Hoyle Sauer

## 2016-05-18 ENCOUNTER — Ambulatory Visit (INDEPENDENT_AMBULATORY_CARE_PROVIDER_SITE_OTHER): Payer: Medicare Other | Admitting: Family

## 2016-05-18 ENCOUNTER — Encounter: Payer: Self-pay | Admitting: Family

## 2016-05-18 DIAGNOSIS — F341 Dysthymic disorder: Secondary | ICD-10-CM

## 2016-05-18 DIAGNOSIS — M25551 Pain in right hip: Secondary | ICD-10-CM

## 2016-05-18 DIAGNOSIS — K219 Gastro-esophageal reflux disease without esophagitis: Secondary | ICD-10-CM

## 2016-05-18 DIAGNOSIS — E559 Vitamin D deficiency, unspecified: Secondary | ICD-10-CM | POA: Diagnosis not present

## 2016-05-18 DIAGNOSIS — M8580 Other specified disorders of bone density and structure, unspecified site: Secondary | ICD-10-CM

## 2016-05-18 DIAGNOSIS — M797 Fibromyalgia: Secondary | ICD-10-CM

## 2016-05-18 MED ORDER — VITAMIN D 1000 UNITS PO TABS
1000.0000 [IU] | ORAL_TABLET | Freq: Two times a day (BID) | ORAL | Status: DC
Start: 2016-05-18 — End: 2016-09-08

## 2016-05-18 MED ORDER — OMEPRAZOLE 40 MG PO CPDR: 40.0000 mg | DELAYED_RELEASE_CAPSULE | Freq: Every day | ORAL | 1 refills | Status: DC

## 2016-05-18 NOTE — Assessment & Plan Note (Signed)
Continue calcium and vit D.  °

## 2016-05-18 NOTE — Assessment & Plan Note (Signed)
Does better when on cymbalta, continue cymbalta.

## 2016-05-18 NOTE — Assessment & Plan Note (Signed)
Stable overall, continue current dose of cymbalta.

## 2016-05-18 NOTE — Progress Notes (Signed)
Subjective:    Patient ID: Vanessa Oliver, female    DOB: [birth year], 65 y.o.   MRN: [id number]  HPI  Vanessa Oliver is a 65 yr old female who presents today for follow up. She continues cymbalta, notes some fibro pain around her neck.    Depression/anxiety- reports that her depression/anxiety are stable overall.  Did have some anxiety recently when going up an escalator.    She is scheduled to see Dr. Maureen Ralphs tomorrow for her knee and hip pain.    GERD- continues omeprazole once daily. This keeps her symptoms under control.    Review of Systems See HPI  Past Medical History:  Diagnosis Date  . Allergy    takes Singulair at night  . Arthritis   . Chronic back pain    stenosis  . Depression    takes CYmbalta daily  . Esophageal stricture   . Family history of adverse reaction to anesthesia    oldest brother had trouble with anesthesia a long time ago but can't recall what  . Fibromyalgia   . Fibromyalgia   . GERD (gastroesophageal reflux disease)    takes Omeprazole daily  . Hemorrhoids   . Hiatal hernia   . History of bronchitis   . Hyperlipidemia   . IBS (irritable bowel syndrome)   . Joint pain   . Joint swelling   . Plantar fasciitis   . Rosacea   . TMJ (temporomandibular joint disorder)   . Vasovagal episode back in the 80's  . Vasovagal syncope   . Weakness    right leg     Social History   Social History  . Marital status: Married    Spouse name: N/A  . Number of children: 0  . Years of education: N/A   Occupational History  . 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 Smoking status: Never Smoker  . Smokeless tobacco: Never Used  . Alcohol use 0.0 oz/week     Comment: rarely  . Drug use: No  . Sexual activity: Yes    Birth control/ protection: Surgical   Other Topics Concern  . Not on file   Social History Narrative   Works as an Glass blower/designer   Married- husband is 48 years older than her   Former Dance movement psychotherapist up up  [employer]   Has cats   Enjoys reading   No children    Past Surgical History:  Procedure Laterality Date  . ABDOMINAL HYSTERECTOMY  1997  . CARPAL TUNNEL RELEASE Bilateral 2004  . CARPOMETACARPAL (CMC) FUSION OF THUMB  2015   thumb  . CATARACT EXTRACTION Right 2013   Dr Ellison Hughs  . COLONOSCOPY    . COLONOSCOPY WITH ESOPHAGOGASTRODUODENOSCOPY (EGD) AND ESOPHAGEAL DILATION (ED)    . EYE SURGERY     scar tissue remove  . EYE SURGERY  04/2016  . 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 KNEE ARTHROSCOPY Right 1994   x 3  . LUMBAR LAMINECTOMY/DECOMPRESSION MICRODISCECTOMY Right 02/11/2015   Procedure: Laminectomy and Foraminotomy - Right - L3-L4;  Surgeon: Earnie Larsson, MD;  Location: [facility];  Service: Neurosurgery;  Laterality: Right;  Laminectomy and Foraminotomy - Right - L3-L4  . mallet finger Right 2011  . NASAL SEPTUM SURGERY  1970  . NISSEN FUNDOPLICATION  0000000  . NISSEN FUNDOPLICATION    . RETINAL LASER PROCEDURE Right 02/01/2011   right eye   . TONSILLECTOMY  1981  .  TRIGGER FINGER RELEASE Bilateral 2005  . VARICOSE VEIN SURGERY Bilateral 2007, 2009   left 2007, right 2009  . VITRECTOMY Right 2012    Family History  Problem Relation Age of Onset  . Breast cancer Mother   . Arthritis Mother   . Hyperlipidemia Mother   . Hypertension Mother   . Lung cancer Father   . Pancreatic cancer Father   . Skin cancer Father   . Arthritis Father   . Hyperlipidemia Maternal Grandfather   . Heart disease Maternal Grandfather   . Stroke Maternal Grandfather   . Celiac disease Brother     Allergies  Allergen Reactions  . Aleve [Naproxen Sodium]     Swelling, itching  . Cefuroxime Axetil     Hives / "throat swelling"  . Chlorzoxazone Rash and Other (See Comments)    "breathing problems"   . Oxycodone     Hallucinations, rash  . Penicillins Anaphylaxis  . Adhesive [Tape]     rash  . Cataflam [Diclofenac Potassium]     Lip swelling  . Clarithromycin Diarrhea  .  Flagyl [Metronidazole Hcl]     ?diarrhea  . Glucosamine Hives  . Guaifenesin & Derivatives     Stomach upset  . Hydrocodone-Acetaminophen     REACTION: rash, tightening of throat  . Ketorolac Tromethamine     ?reaction  . Pentazocine     Involuntary twitching  . Pneumovax [Pneumococcal Polysaccharide Vaccine] Other (See Comments)    Redness/swelling at site, nausea  . Prevnar [Pneumococcal 13-Val Conj Vacc] Other (See Comments)    Swelling, redness, nausea  . Smz-Tmp Ds [Sulfamethoxazole W/Trimethoprim (Co-Trimoxazole)]     ?unknown reaction  . Tramadol     constipation  . Trazodone And Nefazodone Other (See Comments)    Scratchy throat / congestion    Current Outpatient Prescriptions on File Prior to Visit  Medication Sig Dispense Refill  . aspirin 81 MG tablet Take 81 mg by mouth daily. Reported on 12/04/2015    . Azelaic Acid (FINACEA) 15 % cream Apply topically 1 day or 1 dose. After skin is thoroughly washed and patted dry, gently but thoroughly massage a thin film of azelaic acid cream into the affected area twice daily, in the morning and evening.     . benzoyl peroxide 5 % gel Apply 1 application topically daily.     . Calcium Carbonate-Vitamin D (CALTRATE 600+D) 600-400 MG-UNIT per tablet Take 1 tablet by mouth 2 (two) times daily. (Patient taking differently: Take 2 tablets by mouth 2 (two) times daily. )    . DULoxetine (CYMBALTA) 60 MG capsule Take 1 capsule (60 mg total) by mouth daily. 90 capsule 1  . EPINEPHrine (EPIPEN 2-PAK) 0.3 mg/0.3 mL IJ SOAJ injection Inject 0.3 mLs (0.3 mg total) into the muscle once. 1 Device 1  . FIBER PO Take 2 capsules by mouth 2 (two) times daily.    . fluticasone (FLONASE) 50 MCG/ACT nasal spray Place 1 spray into both nostrils daily. 48 g 1  . loratadine (CLARITIN) 10 MG tablet Take 10 mg by mouth daily as needed for allergies.    . Multiple Vitamin (MULTIVITAMIN) tablet Take 1 tablet by mouth daily.    . pravastatin (PRAVACHOL) 20 MG  tablet Take 1 tablet (20 mg total) by mouth at bedtime. (Patient taking differently: Take 10 mg by mouth at bedtime. ) 90 tablet 1  . Probiotic Product (PROBIOTIC DAILY PO) Take 1 tablet by mouth daily.    . vitamin C (  ASCORBIC ACID) 500 MG tablet Take 500 mg by mouth daily.    . VOLTAREN 1 % GEL APPLY 2 GRAMS TOPICALLY TWICE A DAY AS NEEDED 300 g 1   No current facility-administered medications on file prior to visit.     BP 135/67 (BP Location: Right Arm, Patient Position: Sitting, Cuff Size: Normal)   Pulse 72   Temp 97.9 F (36.6 C) (Oral)   Resp 16   Ht 5\' 4"  (1.626 m)   Wt 168 lb 6.4 oz (76.4 kg)   LMP 05/14/1996   SpO2 100% Comment: RA  BMI 28.91 kg/m       Objective:   Physical Exam  Constitutional: She is oriented to person, place, and time. She appears well-developed and well-nourished.  HENT:  Head: Normocephalic and atraumatic.  Cardiovascular: Normal rate, regular rhythm and normal heart sounds.   No murmur heard. Pulmonary/Chest: Effort normal and breath sounds normal. No respiratory distress. She has no wheezes.  Musculoskeletal: She exhibits no edema.  Neurological: She is alert and oriented to person, place, and time.  Psychiatric: She has a normal mood and affect. Her behavior is normal. Judgment and thought content normal.          Assessment & Plan:

## 2016-05-18 NOTE — Assessment & Plan Note (Signed)
Advised pt to keep her upcoming apt with ortho.

## 2016-05-18 NOTE — Assessment & Plan Note (Signed)
Continue vit d.

## 2016-05-18 NOTE — Progress Notes (Signed)
Pre visit review using our clinic review tool, if applicable. No additional management support is needed unless otherwise documented below in the visit note. 

## 2016-05-18 NOTE — Assessment & Plan Note (Signed)
Stable on once daily PPI, continue same.

## 2016-05-21 DIAGNOSIS — M1711 Unilateral primary osteoarthritis, right knee: Secondary | ICD-10-CM | POA: Diagnosis not present

## 2016-05-28 ENCOUNTER — Telehealth: Payer: Self-pay

## 2016-05-28 NOTE — Telephone Encounter (Signed)
Patient scheduled for 08/09/16 1:30 30 minute slot

## 2016-05-28 NOTE — Telephone Encounter (Signed)
Received Surgical Clearance form on 05/28/16.  Pt last OV: 05/18/16.  No recent EKG or labs on chart.  Pt needs an surgical clearance office visit (30 mins) scheduled.    Please schedule.

## 2016-06-15 NOTE — Telephone Encounter (Signed)
Surgical clearance form placed in blue folder on CMA desk.

## 2016-07-06 DIAGNOSIS — M1711 Unilateral primary osteoarthritis, right knee: Secondary | ICD-10-CM | POA: Diagnosis not present

## 2016-08-09 ENCOUNTER — Ambulatory Visit (HOSPITAL_BASED_OUTPATIENT_CLINIC_OR_DEPARTMENT_OTHER)
Admission: RE | Admit: 2016-08-09 | Discharge: 2016-08-09 | Disposition: A | Discharge status: Home / Self Care | Payer: Medicare Other | Source: Ambulatory Visit | Attending: Family | Admitting: Family

## 2016-08-09 ENCOUNTER — Encounter: Payer: Self-pay | Admitting: Family

## 2016-08-09 ENCOUNTER — Ambulatory Visit (INDEPENDENT_AMBULATORY_CARE_PROVIDER_SITE_OTHER): Payer: Medicare Other | Admitting: Family

## 2016-08-09 VITALS — BP 144/61 | HR 85 | Temp 98.0°F | Resp 18 | Ht 64.0 in | Wt 168.6 lb

## 2016-08-09 DIAGNOSIS — Z01818 Encounter for other preprocedural examination: Secondary | ICD-10-CM | POA: Insufficient documentation

## 2016-08-09 DIAGNOSIS — R8299 Other abnormal findings in urine: Secondary | ICD-10-CM | POA: Diagnosis not present

## 2016-08-09 LAB — CBC WITH DIFFERENTIAL/PLATELET
BASOS PCT: 0.7 % (ref 0.0–3.0)
Basophils Absolute: 0.1 10*3/uL (ref 0.0–0.1)
Eosinophils Absolute: 0.2 10*3/uL (ref 0.0–0.7)
Eosinophils Relative: 3.3 % (ref 0.0–5.0)
HEMATOCRIT: 44.3 % (ref 36.0–46.0)
HEMOGLOBIN: 15.1 g/dL — AB (ref 12.0–15.0)
LYMPHS PCT: 32.7 % (ref 12.0–46.0)
Lymphs Abs: 2.4 10*3/uL (ref 0.7–4.0)
MCHC: 34 g/dL (ref 30.0–36.0)
MCV: 93.2 fl (ref 78.0–100.0)
MONO ABS: 0.6 10*3/uL (ref 0.1–1.0)
Monocytes Relative: 7.7 % (ref 3.0–12.0)
Neutro Abs: 4 10*3/uL (ref 1.4–7.7)
Neutrophils Relative %: 55.6 % (ref 43.0–77.0)
Platelets: 316 10*3/uL (ref 150.0–400.0)
RBC: 4.75 Mil/uL (ref 3.87–5.11)
RDW: 13.5 % (ref 11.5–15.5)
WBC: 7.2 10*3/uL (ref 4.0–10.5)

## 2016-08-09 LAB — URINALYSIS, ROUTINE W REFLEX MICROSCOPIC
BILIRUBIN URINE: NEGATIVE
HGB URINE DIPSTICK: NEGATIVE
Ketones, ur: NEGATIVE
Leukocytes, UA: NEGATIVE
NITRITE: NEGATIVE
RBC / HPF: NONE SEEN (ref 0–?)
Specific Gravity, Urine: 1.025 (ref 1.000–1.030)
Total Protein, Urine: NEGATIVE
URINE GLUCOSE: NEGATIVE
Urobilinogen, UA: 0.2 (ref 0.0–1.0)
pH: 6 (ref 5.0–8.0)

## 2016-08-09 LAB — PROTIME-INR
INR: 1 ratio (ref 0.8–1.0)
Prothrombin Time: 10.7 s (ref 9.6–13.1)

## 2016-08-09 LAB — BASIC METABOLIC PANEL
BUN: 21 mg/dL (ref 6–23)
CALCIUM: 9.7 mg/dL (ref 8.4–10.5)
CO2: 29 mEq/L (ref 19–32)
CREATININE: 0.77 mg/dL (ref 0.40–1.20)
Chloride: 104 mEq/L (ref 96–112)
GFR: 79.45 mL/min (ref >60.00)
GLUCOSE: 114 mg/dL — AB (ref 70–99)
POTASSIUM: 4.7 meq/L (ref 3.5–5.1)
Sodium: 140 mEq/L (ref 135–145)

## 2016-08-09 LAB — HEPATIC FUNCTION PANEL
ALBUMIN: 4.4 g/dL (ref 3.5–5.2)
ALT: 13 U/L (ref 0–35)
AST: 14 U/L (ref 0–37)
Alkaline Phosphatase: 70 U/L (ref 39–117)
BILIRUBIN TOTAL: 0.5 mg/dL (ref 0.2–1.2)
Bilirubin, Direct: 0.1 mg/dL (ref 0.0–0.3)
Total Protein: 7.1 g/dL (ref 6.0–8.3)

## 2016-08-09 MED ORDER — DICLOFENAC SODIUM 1 % TD GEL: TRANSDERMAL | 1 refills | Status: DC

## 2016-08-09 NOTE — Progress Notes (Signed)
Subjective:    Patient ID: Vanessa Oliver, female    DOB: 1951/03/10, 66 y.o.   MRN: [id number]  HPI  Ms. Koelzer is a 66 yr old female who presents today for a pre-operative evaluation for upcoming TKA. She reports that pain has been quite bad in the right knee though recently she he has had some relief from the cortisone shot.     Review of Systems  Constitutional: Negative for unexpected weight change.  HENT: Negative for hearing loss.   Eyes: Negative for visual disturbance.  Respiratory: Negative for cough and shortness of breath.   Cardiovascular: Negative for chest pain.       Notes mild bilateral LE edema  Gastrointestinal: Negative for constipation, diarrhea and nausea.  Genitourinary: Negative for dysuria, frequency and hematuria.  Musculoskeletal:       + right knee pain  Neurological: Negative for headaches.  Hematological: Negative for adenopathy.       Notes some bruising on her right breast  Psychiatric/Behavioral:       Denies depression/anxiety    Past Medical History:  Diagnosis Date  . Allergy    takes Singulair at night  . Arthritis   . Chronic back pain    stenosis  . Depression    takes CYmbalta daily  . Esophageal stricture   . Family history of adverse reaction to anesthesia    oldest brother had trouble with anesthesia a long time ago but can't recall what  . Fibromyalgia   . Fibromyalgia   . GERD (gastroesophageal reflux disease)    takes Omeprazole daily  . Hemorrhoids   . Hiatal hernia   . History of bronchitis   . Hyperlipidemia   . IBS (irritable bowel syndrome)   . Joint pain   . Joint swelling   . Plantar fasciitis   . Rosacea   . TMJ (temporomandibular joint disorder)   . Vasovagal episode back in the 80's  . Vasovagal syncope   . Weakness    right leg     Social History   Social History  . Marital status: Married    Spouse name: N/A  . Number of children: 0  . Years of education: N/A   Occupational History  .  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 Smoking status: Never Smoker  . Smokeless tobacco: Never Used  . Alcohol use 0.0 oz/week     Comment: rarely  . Drug use: No  . Sexual activity: Yes    Birth control/ protection: Surgical   Other Topics Concern  . Not on file   Social History Narrative   Works as an Glass blower/designer   Married- husband is 62 years older than her   Former Dance movement psychotherapist up [employer]   Has cats   Enjoys reading   No children    Past Surgical History:  Procedure Laterality Date  . ABDOMINAL HYSTERECTOMY  1997  . CARPAL TUNNEL RELEASE Bilateral 2004  . CARPOMETACARPAL (CMC) FUSION OF THUMB  2015   thumb  . CATARACT EXTRACTION Right 2013   Dr Ellison Hughs  . COLONOSCOPY    . COLONOSCOPY WITH ESOPHAGOGASTRODUODENOSCOPY (EGD) AND ESOPHAGEAL DILATION (ED)    . EYE SURGERY     scar tissue remove  . EYE SURGERY  04/2016  . 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 KNEE ARTHROSCOPY Right 1994   x 3  . LUMBAR LAMINECTOMY/DECOMPRESSION MICRODISCECTOMY Right 02/11/2015  Procedure: Laminectomy and Foraminotomy - Right - L3-L4;  Surgeon: Earnie Larsson, MD;  Location: [facility];  Service: Neurosurgery;  Laterality: Right;  Laminectomy and Foraminotomy - Right - L3-L4  . mallet finger Right 2011  . NASAL SEPTUM SURGERY  1970  . NISSEN FUNDOPLICATION  0000000  . NISSEN FUNDOPLICATION    . RETINAL LASER PROCEDURE Right 02/01/2011   right eye   . TONSILLECTOMY  1981  . TRIGGER FINGER RELEASE Bilateral 2005  . VARICOSE VEIN SURGERY Bilateral 2007, 2009   left 2007, right 2009  . VITRECTOMY Right 2012    Family History  Problem Relation Age of Onset  . Breast cancer Mother   . Arthritis Mother   . Hyperlipidemia Mother   . Hypertension Mother   . Lung cancer Father   . Pancreatic cancer Father   . Skin cancer Father   . Arthritis Father   . Hyperlipidemia Maternal Grandfather   . Heart disease Maternal Grandfather   . Stroke Maternal  Grandfather   . Celiac disease Brother     Allergies  Allergen Reactions  . Aleve [Naproxen Sodium]     Swelling, itching  . Cefuroxime Axetil     Hives / "throat swelling"  . Chlorzoxazone Rash and Other (See Comments)    "breathing problems"   . Oxycodone     Hallucinations, rash  . Penicillins Anaphylaxis  . Adhesive [Tape]     rash  . Cataflam [Diclofenac Potassium]     Lip swelling  . Clarithromycin Diarrhea  . Flagyl [Metronidazole Hcl]     ?diarrhea  . Glucosamine Hives  . Guaifenesin & Derivatives     Stomach upset  . Hydrocodone-Acetaminophen     REACTION: rash, tightening of throat  . Ketorolac Tromethamine     ?reaction  . Pentazocine     Involuntary twitching  . Pneumovax [Pneumococcal Polysaccharide Vaccine] Other (See Comments)    Redness/swelling at site, nausea  . Prevnar [Pneumococcal 13-Val Conj Vacc] Other (See Comments)    Swelling, redness, nausea  . Smz-Tmp Ds [Sulfamethoxazole W/Trimethoprim (Co-Trimoxazole)]     ?unknown reaction  . Tramadol     constipation  . Trazodone And Nefazodone Other (See Comments)    Scratchy throat / congestion    Current Outpatient Prescriptions on File Prior to Visit  Medication Sig Dispense Refill  . aspirin 81 MG tablet Take 81 mg by mouth daily. Reported on 12/04/2015    . Azelaic Acid (FINACEA) 15 % cream Apply topically 1 day or 1 dose. After skin is thoroughly washed and patted dry, gently but thoroughly massage a thin film of azelaic acid cream into the affected area twice daily, in the morning and evening.     . benzoyl peroxide 5 % gel Apply 1 application topically daily.     . Calcium Carbonate-Vitamin D (CALTRATE 600+D) 600-400 MG-UNIT per tablet Take 1 tablet by mouth 2 (two) times daily. (Patient taking differently: Take 2 tablets by mouth 2 (two) times daily. )    . cholecalciferol (VITAMIN D) 1000 units tablet Take 1 tablet (1,000 Units total) by mouth 2 (two) times daily.    . DULoxetine (CYMBALTA)  60 MG capsule Take 1 capsule (60 mg total) by mouth daily. 90 capsule 1  . EPINEPHrine (EPIPEN 2-PAK) 0.3 mg/0.3 mL IJ SOAJ injection Inject 0.3 mLs (0.3 mg total) into the muscle once. 1 Device 1  . FIBER PO Take 2 capsules by mouth 2 (two) times daily.    Marland Kitchen loratadine (  CLARITIN) 10 MG tablet Take 10 mg by mouth daily as needed for allergies.    . Multiple Vitamin (MULTIVITAMIN) tablet Take 1 tablet by mouth daily.    Marland Kitchen omeprazole (PRILOSEC) 40 MG capsule Take 1 capsule (40 mg total) by mouth daily. 180 capsule 1  . pravastatin (PRAVACHOL) 20 MG tablet Take 1 tablet (20 mg total) by mouth at bedtime. (Patient taking differently: Take 10 mg by mouth at bedtime. ) 90 tablet 1  . Probiotic Product (PROBIOTIC DAILY PO) Take 1 tablet by mouth daily.    . vitamin C (ASCORBIC ACID) 500 MG tablet Take 500 mg by mouth daily.    . VOLTAREN 1 % GEL APPLY 2 GRAMS TOPICALLY TWICE A DAY AS NEEDED 300 g 1   No current facility-administered medications on file prior to visit.     BP (!) 144/61 (BP Location: Right Arm, Cuff Size: Normal)   Pulse 85   Temp 98 F (36.7 C)   Resp 18   Ht 5\' 4"  (1.626 m)   Wt 168 lb 9.6 oz (76.5 kg)   LMP 05/14/1996   SpO2 100%   BMI 28.94 kg/m       Objective:   Physical Exam  Constitutional: She is oriented to person, place, and time. She appears well-developed and well-nourished. No distress.  HENT:  Head: Normocephalic and atraumatic.  Pulmonary/Chest: Effort normal and breath sounds normal.  Abdominal: Soft. Bowel sounds are normal. She exhibits no distension and no mass. There is no tenderness. There is no rebound and no guarding.  Musculoskeletal:  No joint swelling is noted. 1+ bilateral LE edema  Neurological: She is alert and oriented to person, place, and time.  Skin: Skin is warm and dry.  Psychiatric: She has a normal mood and affect. Her behavior is normal. Judgment and thought content normal.          Assessment & Plan:  Pre-operative  evaluation-  EKG tracing is personally reviewed.  EKG notes NSR.  No acute changes.  Will obtain routine lab work as well as CXR prior to surgical clearance.

## 2016-08-09 NOTE — Progress Notes (Signed)
Pre visit review using our clinic review tool, if applicable. No additional management support is needed unless otherwise documented below in the visit note. 

## 2016-08-09 NOTE — Patient Instructions (Signed)
Please complete lab work prior to leaving. Complete your chest xray on the first floor. We will fax your clearance letter to your surgeon after we have reviewed your results.

## 2016-08-10 DIAGNOSIS — M1711 Unilateral primary osteoarthritis, right knee: Secondary | ICD-10-CM | POA: Diagnosis not present

## 2016-08-10 LAB — URINE CULTURE: ORGANISM ID, BACTERIA: NO GROWTH

## 2016-08-12 ENCOUNTER — Encounter: Payer: Self-pay | Admitting: Pulmonary Disease

## 2016-08-16 ENCOUNTER — Ambulatory Visit (INDEPENDENT_AMBULATORY_CARE_PROVIDER_SITE_OTHER): Payer: Medicare Other | Admitting: Family

## 2016-08-16 ENCOUNTER — Encounter: Payer: Self-pay | Admitting: Family

## 2016-08-16 VITALS — BP 147/64 | HR 103 | Temp 98.9°F | Resp 18 | Ht 64.0 in | Wt 168.4 lb

## 2016-08-16 DIAGNOSIS — R059 Cough, unspecified: Secondary | ICD-10-CM

## 2016-08-16 DIAGNOSIS — R05 Cough: Secondary | ICD-10-CM | POA: Diagnosis not present

## 2016-08-16 DIAGNOSIS — J111 Influenza due to unidentified influenza virus with other respiratory manifestations: Secondary | ICD-10-CM | POA: Diagnosis not present

## 2016-08-16 DIAGNOSIS — J029 Acute pharyngitis, unspecified: Secondary | ICD-10-CM

## 2016-08-16 LAB — POCT INFLUENZA A: RAPID INFLUENZA A AGN: POSITIVE

## 2016-08-16 LAB — POCT RAPID STREP A (OFFICE): RAPID STREP A SCREEN: NEGATIVE

## 2016-08-16 MED ORDER — BENZONATATE 100 MG PO CAPS: 100.0000 mg | ORAL_CAPSULE | Freq: Three times a day (TID) | ORAL | 0 refills | Status: DC | PRN

## 2016-08-16 NOTE — Patient Instructions (Signed)
You may use tylenol as needed for pain/fever. You may use tessalon as needed for cough.  Drink plenty of fluids and get plenty of rest. Call if you develop new/worsening symptoms or if you are not improved in 3 days.

## 2016-08-16 NOTE — Progress Notes (Signed)
Pre visit review using our clinic review tool, if applicable. No additional management support is needed unless otherwise documented below in the visit note. 

## 2016-08-16 NOTE — Progress Notes (Signed)
Subjective:    Patient ID: Vanessa Oliver, female    DOB: [birth year], 66 y.o.   MRN: [id number]  HPI  Vanessa Oliver is a 66 yr old female who presents today with chief complaint of sore throat.  She reports HA, sore throat, cough and body aches.  Symptoms began 3 days ago. Using mucinex DM and claritin.    Review of Systems See HPI  Past Medical History:  Diagnosis Date  . Allergy    takes Singulair at night  . Arthritis   . Chronic back pain    stenosis  . Depression    takes CYmbalta daily  . Esophageal stricture   . Family history of adverse reaction to anesthesia    oldest brother had trouble with anesthesia a long time ago but can't recall what  . Fibromyalgia   . Fibromyalgia   . GERD (gastroesophageal reflux disease)    takes Omeprazole daily  . Hemorrhoids   . Hiatal hernia   . History of bronchitis   . Hyperlipidemia   . IBS (irritable bowel syndrome)   . Joint pain   . Joint swelling   . Plantar fasciitis   . Rosacea   . TMJ (temporomandibular joint disorder)   . Vasovagal episode back in the 80's  . Vasovagal syncope   . Weakness    right leg     Social History   Social History  . Marital status: Married    Spouse name: N/A  . Number of children: 0  . Years of education: N/A   Occupational History  . 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 Smoking status: Never Smoker  . Smokeless tobacco: Never Used  . Alcohol use 0.0 oz/week     Comment: rarely  . Drug use: No  . Sexual activity: Yes    Birth control/ protection: Surgical   Other Topics Concern  . Not on file   Social History Narrative   Works as an Glass blower/designer   Married- husband is 86 years older than her   Former Dance movement psychotherapist up [employer]   Has cats   Enjoys reading   No children    Past Surgical History:  Procedure Laterality Date  . ABDOMINAL HYSTERECTOMY  1997  . CARPAL TUNNEL RELEASE Bilateral 2004  . CARPOMETACARPAL (CMC) FUSION OF THUMB  2015   thumb  . CATARACT EXTRACTION Right 2013   Dr Ellison Hughs  . COLONOSCOPY    . COLONOSCOPY WITH ESOPHAGOGASTRODUODENOSCOPY (EGD) AND ESOPHAGEAL DILATION (ED)    . EYE SURGERY     scar tissue remove  . EYE SURGERY  04/2016  . 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 KNEE ARTHROSCOPY Right 1994   x 3  . LUMBAR LAMINECTOMY/DECOMPRESSION MICRODISCECTOMY Right 02/11/2015   Procedure: Laminectomy and Foraminotomy - Right - L3-L4;  Surgeon: Earnie Larsson, MD;  Location: [facility];  Service: Neurosurgery;  Laterality: Right;  Laminectomy and Foraminotomy - Right - L3-L4  . mallet finger Right 2011  . NASAL SEPTUM SURGERY  1970  . NISSEN FUNDOPLICATION  0000000  . NISSEN FUNDOPLICATION    . RETINAL LASER PROCEDURE Right 02/01/2011   right eye   . TONSILLECTOMY  1981  . TRIGGER FINGER RELEASE Bilateral 2005  . VARICOSE VEIN SURGERY Bilateral 2007, 2009   left 2007, right 2009  . VITRECTOMY Right 2012    Family History  Problem Relation Age of Onset  . Breast  cancer Mother   . Arthritis Mother   . Hyperlipidemia Mother   . Hypertension Mother   . Lung cancer Father   . Pancreatic cancer Father   . Skin cancer Father   . Arthritis Father   . Hyperlipidemia Maternal Grandfather   . Heart disease Maternal Grandfather   . Stroke Maternal Grandfather   . Celiac disease Brother     Allergies  Allergen Reactions  . Aleve [Naproxen Sodium]     Swelling, itching  . Cefuroxime Axetil     Hives / "throat swelling"  . Chlorzoxazone Rash and Other (See Comments)    "breathing problems"   . Oxycodone     Hallucinations, rash  . Penicillins Anaphylaxis  . Adhesive [Tape]     rash  . Cataflam [Diclofenac Potassium]     Lip swelling  . Clarithromycin Diarrhea  . Flagyl [Metronidazole Hcl]     ?diarrhea  . Glucosamine Hives  . Guaifenesin & Derivatives     Stomach upset  . Hydrocodone-Acetaminophen     REACTION: rash, tightening of throat  . Ketorolac Tromethamine     ?reaction  .  Pentazocine     Involuntary twitching  . Pneumovax [Pneumococcal Polysaccharide Vaccine] Other (See Comments)    Redness/swelling at site, nausea  . Prevnar [Pneumococcal 13-Val Conj Vacc] Other (See Comments)    Swelling, redness, nausea  . Smz-Tmp Ds [Sulfamethoxazole W/Trimethoprim (Co-Trimoxazole)]     ?unknown reaction  . Tramadol     constipation  . Trazodone And Nefazodone Other (See Comments)    Scratchy throat / congestion    Current Outpatient Prescriptions on File Prior to Visit  Medication Sig Dispense Refill  . aspirin 81 MG tablet Take 81 mg by mouth daily. Reported on 12/04/2015    . Azelaic Acid (FINACEA) 15 % cream Apply topically 1 day or 1 dose. After skin is thoroughly washed and patted dry, gently but thoroughly massage a thin film of azelaic acid cream into the affected area twice daily, in the morning and evening.     . benzoyl peroxide 5 % gel Apply 1 application topically daily.     . Calcium Carbonate-Vitamin D (CALTRATE 600+D) 600-400 MG-UNIT per tablet Take 1 tablet by mouth 2 (two) times daily. (Patient taking differently: Take 2 tablets by mouth 2 (two) times daily. )    . cholecalciferol (VITAMIN D) 1000 units tablet Take 1 tablet (1,000 Units total) by mouth 2 (two) times daily.    . diclofenac sodium (VOLTAREN) 1 % GEL Apply 2 grams topically bid prn 300 g 1  . DULoxetine (CYMBALTA) 60 MG capsule Take 1 capsule (60 mg total) by mouth daily. 90 capsule 1  . EPINEPHrine (EPIPEN 2-PAK) 0.3 mg/0.3 mL IJ SOAJ injection Inject 0.3 mLs (0.3 mg total) into the muscle once. 1 Device 1  . FIBER PO Take 2 capsules by mouth 2 (two) times daily.    Marland Kitchen loratadine (CLARITIN) 10 MG tablet Take 10 mg by mouth daily as needed for allergies.    . Multiple Vitamin (MULTIVITAMIN) tablet Take 1 tablet by mouth daily.    Marland Kitchen omeprazole (PRILOSEC) 40 MG capsule Take 1 capsule (40 mg total) by mouth daily. 180 capsule 1  . pravastatin (PRAVACHOL) 20 MG tablet Take 1 tablet (20 mg  total) by mouth at bedtime. (Patient taking differently: Take 10 mg by mouth at bedtime. ) 90 tablet 1  . Probiotic Product (PROBIOTIC DAILY PO) Take 1 tablet by mouth daily.    Marland Kitchen  vitamin C (ASCORBIC ACID) 500 MG tablet Take 500 mg by mouth daily.     No current facility-administered medications on file prior to visit.     BP (!) 147/64 (BP Location: Right Arm, Cuff Size: Normal)   Pulse (!) 103   Temp 98.9 F (37.2 C) (Oral)   Resp 18   Ht 5\' 4"  (1.626 m)   Wt 168 lb 6.4 oz (76.4 kg)   LMP 05/14/1996   SpO2 100%   BMI 28.91 kg/m       Objective:   Physical Exam  Constitutional: She is oriented to person, place, and time. She appears well-developed and well-nourished.  HENT:  Head: Normocephalic and atraumatic.  Right Ear: Tympanic membrane and ear canal normal.  Left Ear: Tympanic membrane and ear canal normal.  Mouth/Throat: No oropharyngeal exudate, posterior oropharyngeal edema or posterior oropharyngeal erythema.  Cardiovascular: Normal rate, regular rhythm and normal heart sounds.   No murmur heard. Pulmonary/Chest: Effort normal and breath sounds normal. No respiratory distress. She has no wheezes.  Neurological: She is alert and oriented to person, place, and time.  Psychiatric: She has a normal mood and affect. Her behavior is normal. Judgment and thought content normal.          Assessment & Plan:  Influenza- rapid flu is positive.  Rapid strep negative. She is outside window for tamiflu. Will rx with tessalon prn cough. Discussed supportive measures and to call if symptoms worsen or if symptoms are not improved in 3 days.

## 2016-08-18 ENCOUNTER — Ambulatory Visit: Payer: Self-pay | Admitting: Family

## 2016-08-18 NOTE — Telephone Encounter (Signed)
Surgical clearance visit completed 08/09/16 and clearance form faxed to [phone].

## 2016-08-20 ENCOUNTER — Ambulatory Visit: Payer: Self-pay | Admitting: Orthopedic Surgery

## 2016-08-23 ENCOUNTER — Ambulatory Visit: Payer: Medicare Other | Admitting: Family

## 2016-08-25 ENCOUNTER — Ambulatory Visit: Payer: Medicare Other | Admitting: Family

## 2016-08-27 ENCOUNTER — Ambulatory Visit (INDEPENDENT_AMBULATORY_CARE_PROVIDER_SITE_OTHER): Payer: Medicare Other | Admitting: Family

## 2016-08-27 ENCOUNTER — Encounter: Payer: Self-pay | Admitting: Family

## 2016-08-27 ENCOUNTER — Ambulatory Visit: Payer: Medicare Other | Admitting: Family

## 2016-08-27 VITALS — BP 104/63 | HR 84 | Temp 98.1°F | Ht 64.0 in | Wt 164.6 lb

## 2016-08-27 DIAGNOSIS — J111 Influenza due to unidentified influenza virus with other respiratory manifestations: Secondary | ICD-10-CM

## 2016-08-27 NOTE — Progress Notes (Signed)
Pre visit review using our clinic review tool, if applicable. No additional management support is needed unless otherwise documented below in the visit note. 

## 2016-08-27 NOTE — Progress Notes (Signed)
Subjective:    Patient ID: Vanessa Oliver, female    DOB: 1950-11-12, 66 y.o.   MRN: [id number]  HPI  Vanessa Oliver is a 66 yr old female who presents today for follow up of her cough/influenza.  She was seen on 2/5 initially.  Symptoms began on 08/13/16.  She is scheduled for a TKA on 09/06/16. She reports feeling "95%" better.  Still has a mild cough.   Wt Readings from Last 3 Encounters:  08/27/16 164 lb 9.6 oz (74.7 kg)  08/16/16 168 lb 6.4 oz (76.4 kg)  08/09/16 168 lb 9.6 oz (76.5 kg)    Review of Systems    see HPI  Past Medical History:  Diagnosis Date  . Allergy    takes Singulair at night  . Arthritis   . Chronic back pain    stenosis  . Depression    takes CYmbalta daily  . Esophageal stricture   . Family history of adverse reaction to anesthesia    oldest brother had trouble with anesthesia a long time ago but can't recall what  . Fibromyalgia   . Fibromyalgia   . GERD (gastroesophageal reflux disease)    takes Omeprazole daily  . Hemorrhoids   . Hiatal hernia   . History of bronchitis   . Hyperlipidemia   . IBS (irritable bowel syndrome)   . Joint pain   . Joint swelling   . Plantar fasciitis   . Rosacea   . TMJ (temporomandibular joint disorder)   . Vasovagal episode back in the 80's  . Vasovagal syncope   . Weakness    right leg     Social History   Social History  . Marital status: Married    Spouse name: N/A  . Number of children: 0  . Years of education: N/A   Occupational History  . 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 Smoking status: Never Smoker  . Smokeless tobacco: Never Used  . Alcohol use 0.0 oz/week     Comment: rarely  . Drug use: No  . Sexual activity: Yes    Birth control/ protection: Surgical   Other Topics Concern  . Not on file   Social History Narrative   Works as an Glass blower/designer   Married- husband is 49 years older than her   Former Dance movement psychotherapist up up [employer]   Has cats   Enjoys reading     No children    Past Surgical History:  Procedure Laterality Date  . ABDOMINAL HYSTERECTOMY  1997  . CARPAL TUNNEL RELEASE Bilateral 2004  . CARPOMETACARPAL (CMC) FUSION OF THUMB  2015   thumb  . CATARACT EXTRACTION Right 2013   Dr Ellison Hughs  . COLONOSCOPY    . COLONOSCOPY WITH ESOPHAGOGASTRODUODENOSCOPY (EGD) AND ESOPHAGEAL DILATION (ED)    . EYE SURGERY     scar tissue remove  . EYE SURGERY  04/2016  . 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 KNEE ARTHROSCOPY Right 1994   x 3  . LUMBAR LAMINECTOMY/DECOMPRESSION MICRODISCECTOMY Right 02/11/2015   Procedure: Laminectomy and Foraminotomy - Right - L3-L4;  Surgeon: Earnie Larsson, MD;  Location: [facility];  Service: Neurosurgery;  Laterality: Right;  Laminectomy and Foraminotomy - Right - L3-L4  . mallet finger Right 2011  . NASAL SEPTUM SURGERY  1970  . NISSEN FUNDOPLICATION  0000000  . NISSEN FUNDOPLICATION    . RETINAL LASER PROCEDURE Right 02/01/2011  right eye   . TONSILLECTOMY  1981  . TRIGGER FINGER RELEASE Bilateral 2005  . VARICOSE VEIN SURGERY Bilateral 2007, 2009   left 2007, right 2009  . VITRECTOMY Right 2012    Family History  Problem Relation Age of Onset  . Breast cancer Mother   . Arthritis Mother   . Hyperlipidemia Mother   . Hypertension Mother   . Lung cancer Father   . Pancreatic cancer Father   . Skin cancer Father   . Arthritis Father   . Hyperlipidemia Maternal Grandfather   . Heart disease Maternal Grandfather   . Stroke Maternal Grandfather   . Celiac disease Brother     Allergies  Allergen Reactions  . Aleve [Naproxen Sodium]     Swelling, itching  . Cefuroxime Axetil     Hives / "throat swelling"  . Chlorzoxazone Rash and Other (See Comments)    "breathing problems"   . Oxycodone     Hallucinations, rash  . Penicillins Anaphylaxis and Hives    Has patient had a PCN reaction causing immediate rash, facial/tongue/throat swelling, SOB or lightheadedness with hypotension: Yes Has  patient had a PCN reaction causing severe rash involving mucus membranes or skin necrosis: No Has patient had a PCN reaction that required hospitalization No Has patient had a PCN reaction occurring within the last 10 years: No If all of the above answers are "NO", then may proceed with Cephalosporin use.   . Adhesive [Tape]     rash  . Cataflam [Diclofenac Potassium]     Lip swelling  . Clarithromycin Diarrhea  . Flagyl [Metronidazole Hcl]     ?diarrhea  . Glucosamine Hives  . Guaifenesin & Derivatives     Stomach upset  . Hydrocodone-Acetaminophen     REACTION: rash, tightening of throat  . Ketorolac Tromethamine     ?reaction  . Pentazocine     Involuntary twitching  . Pneumovax [Pneumococcal Polysaccharide Vaccine] Other (See Comments)    Redness/swelling at site, nausea  . Prevnar [Pneumococcal 13-Val Conj Vacc] Other (See Comments)    Swelling, redness, nausea  . Smz-Tmp Ds [Sulfamethoxazole W/Trimethoprim (Co-Trimoxazole)]     ?unknown reaction  . Tramadol     constipation  . Trazodone And Nefazodone Other (See Comments)    Scratchy throat / congestion  . Latex Rash    Current Outpatient Prescriptions on File Prior to Visit  Medication Sig Dispense Refill  . acetaminophen (TYLENOL) 500 MG tablet Take 1,000 mg by mouth every 6 (six) hours as needed for moderate pain.    Marland Kitchen aspirin 81 MG tablet Take 81 mg by mouth daily. Reported on 12/04/2015    . Azelaic Acid (FINACEA) 15 % cream Apply 1 application topically 2 (two) times daily. After skin is thoroughly washed and patted dry, gently but thoroughly massage a thin film of azelaic acid cream into the affected area twice daily, in the morning and evening.     . benzonatate (TESSALON) 100 MG capsule Take 1 capsule (100 mg total) by mouth 3 (three) times daily as needed. (Patient taking differently: Take 100 mg by mouth 3 (three) times daily as needed for cough. ) 20 capsule 0  . benzoyl peroxide 5 % gel Apply 1 application  topically 2 (two) times daily.     . Calcium Carbonate-Vitamin D (CALTRATE 600+D) 600-400 MG-UNIT per tablet Take 1 tablet by mouth 2 (two) times daily. (Patient taking differently: Take 2 tablets by mouth 2 (two) times daily. )    .  cholecalciferol (VITAMIN D) 1000 units tablet Take 1 tablet (1,000 Units total) by mouth 2 (two) times daily.    . diclofenac sodium (VOLTAREN) 1 % GEL Apply 2 grams topically bid prn (Patient taking differently: Apply 2 g topically 4 (four) times daily as needed (pain). ) 300 g 1  . DULoxetine (CYMBALTA) 60 MG capsule Take 1 capsule (60 mg total) by mouth daily. 90 capsule 1  . EPINEPHrine (EPIPEN 2-PAK) 0.3 mg/0.3 mL IJ SOAJ injection Inject 0.3 mLs (0.3 mg total) into the muscle once. 1 Device 1  . FIBER PO Take 2 capsules by mouth 2 (two) times daily.    Marland Kitchen ibuprofen (ADVIL,MOTRIN) 200 MG tablet Take 400 mg by mouth every 6 (six) hours as needed for moderate pain.    Marland Kitchen loratadine (CLARITIN) 10 MG tablet Take 10 mg by mouth daily as needed for allergies.    . Multiple Vitamin (MULTIVITAMIN) tablet Take 1 tablet by mouth daily.    Marland Kitchen omeprazole (PRILOSEC) 40 MG capsule Take 1 capsule (40 mg total) by mouth daily. (Patient taking differently: Take 40 mg by mouth at bedtime. ) 180 capsule 1  . Polyethylene Glycol 400 (BLINK TEARS) 0.25 % GEL Apply 1 application to eye daily as needed (dry eyes).    . pravastatin (PRAVACHOL) 20 MG tablet Take 1 tablet (20 mg total) by mouth at bedtime. (Patient taking differently: Take 10 mg by mouth at bedtime. ) 90 tablet 1  . Probiotic Product (PROBIOTIC DAILY PO) Take 1 tablet by mouth daily.    . vitamin C (ASCORBIC ACID) 500 MG tablet Take 500 mg by mouth daily.     No current facility-administered medications on file prior to visit.     BP 104/63 (BP Location: Right Arm, Patient Position: Sitting, Cuff Size: Normal)   Pulse 84   Temp 98.1 F (36.7 C) (Oral)   Ht 5\' 4"  (1.626 m)   Wt 164 lb 9.6 oz (74.7 kg)   LMP 05/14/1996    SpO2 100%   BMI 28.25 kg/m    Objective:   Physical Exam  Constitutional: She is oriented to person, place, and time. She appears well-developed and well-nourished.  Cardiovascular: Normal rate, regular rhythm and normal heart sounds.   No murmur heard. Pulmonary/Chest: Effort normal and breath sounds normal. No respiratory distress. She has no wheezes.  Musculoskeletal: She exhibits no edema.  Neurological: She is alert and oriented to person, place, and time.  Psychiatric: She has a normal mood and affect. Her behavior is normal. Judgment and thought content normal.          Assessment & Plan:  Influenza- clinically resolved. Advised pt cough should continue to resolve in then next week or two. I do not see any contraindication to proceeding with her surgery on 2/26 unless new symptoms.

## 2016-08-30 NOTE — Patient Instructions (Signed)
Vanessa Oliver  08/30/2016   Your procedure is scheduled on: 09/06/2016    Report to [facility] Main  Entrance take [facility]  elevators to 3rd floor to  [place] at  0600 AM.  Call this number if you have problems the morning of surgery [phone]   Remember: ONLY 1 PERSON MAY GO WITH YOU TO SHORT STAY TO GET  READY MORNING OF [name].  Do not eat food or drink liquids :After Midnight.     Take these medicines the morning of surgery with A SIP OF WATER: Cymbalta, claritin if needed, eye drops if needed              You may not have any metal on your body including hair pins and              piercings  Do not wear jewelry, make-up, lotions, powders or perfumes, deodorant             Do not wear nail polish.  Do not shave  48 hours prior to surgery.         .   Do not bring valuables to the hospital. [name].  Contacts, dentures or bridgework may not be worn into surgery.  Leave suitcase in the car. After surgery it may be brought to your room.                      Please read over the following fact sheets you were given: _____________________________________________________________________             [facility] - Preparing for Surgery Before surgery, you can play an important role.  Because skin is not sterile, your skin needs to be as free of germs as possible.  You can reduce the number of germs on your skin by washing with CHG (chlorahexidine gluconate) soap before surgery.  CHG is an antiseptic cleaner which kills germs and bonds with the skin to continue killing germs even after washing. Please DO NOT use if you have an allergy to CHG or antibacterial soaps.  If your skin becomes reddened/irritated stop using the CHG and inform your nurse when you arrive at Short Stay. Do not shave (including legs and underarms) for at least 48 hours prior to the first CHG shower.  You may shave your  face/neck. Please follow these instructions carefully:  1.  Shower with CHG Soap the night before surgery and the  morning of Surgery.  2.  If you choose to wash your hair, wash your hair first as usual with your  normal  shampoo.  3.  After you shampoo, rinse your hair and body thoroughly to remove the  shampoo.                           4.  Use CHG as you would any other liquid soap.  You can apply chg directly  to the skin and wash                       Gently with a scrungie or clean washcloth.  5.  Apply the CHG Soap to your body ONLY FROM THE NECK DOWN.   Do not use on  face/ open                           Wound or open sores. Avoid contact with eyes, ears mouth and genitals (private parts).                       Wash face,  Genitals (private parts) with your normal soap.             6.  Wash thoroughly, paying special attention to the area where your surgery  will be performed.  7.  Thoroughly rinse your body with warm water from the neck down.  8.  DO NOT shower/wash with your normal soap after using and rinsing off  the CHG Soap.                9.  Pat yourself dry with a clean towel.            10.  Wear clean pajamas.            11.  Place clean sheets on your bed the night of your first shower and do not  sleep with pets. Day of Surgery : Do not apply any lotions/deodorants the morning of surgery.  Please wear clean clothes to the hospital/surgery center.  FAILURE TO FOLLOW THESE INSTRUCTIONS MAY RESULT IN THE CANCELLATION OF YOUR SURGERY PATIENT SIGNATURE_________________________________  NURSE SIGNATURE__________________________________  ________________________________________________________________________  WHAT IS A BLOOD TRANSFUSION? Blood Transfusion Information  A transfusion is the replacement of blood or some of its parts. Blood is made up of multiple cells which provide different functions.  Red blood cells carry oxygen and are used for blood loss  replacement.  White blood cells fight against infection.  Platelets control bleeding.  Plasma helps clot blood.  Other blood products are available for specialized needs, such as hemophilia or other clotting disorders. BEFORE THE TRANSFUSION  Who gives blood for transfusions?   Healthy volunteers who are fully evaluated to make sure their blood is safe. This is blood bank blood. Transfusion therapy is the safest it has ever been in the practice of medicine. Before blood is taken from a donor, a complete history is taken to make sure that person has no history of diseases nor engages in risky social behavior (examples are intravenous drug use or sexual activity with multiple partners). The donor's travel history is screened to minimize risk of transmitting infections, such as malaria. The donated blood is tested for signs of infectious diseases, such as HIV and hepatitis. The blood is then tested to be sure it is compatible with you in order to minimize the chance of a transfusion reaction. If you or a relative donates blood, this is often done in anticipation of surgery and is not appropriate for emergency situations. It takes many days to process the donated blood. RISKS AND COMPLICATIONS Although transfusion therapy is very safe and saves many lives, the main dangers of transfusion include:   Getting an infectious disease.  Developing a transfusion reaction. This is an allergic reaction to something in the blood you were given. Every precaution is taken to prevent this. The decision to have a blood transfusion has been considered carefully by your caregiver before blood is given. Blood is not given unless the benefits outweigh the risks. AFTER THE TRANSFUSION  Right after receiving a blood transfusion, you will usually feel much better and more energetic. This is especially true if your red  blood cells have gotten low (anemic). The transfusion raises the level of the red blood cells which  carry oxygen, and this usually causes an energy increase.  The nurse administering the transfusion will monitor you carefully for complications. HOME CARE INSTRUCTIONS  No special instructions are needed after a transfusion. You may find your energy is better. Speak with your caregiver about any limitations on activity for underlying diseases you may have. SEEK MEDICAL CARE IF:   Your condition is not improving after your transfusion.  You develop redness or irritation at the intravenous (IV) site. SEEK IMMEDIATE MEDICAL CARE IF:  Any of the following symptoms occur over the next 12 hours:  Shaking chills.  You have a temperature by mouth above 102 F (38.9 C), not controlled by medicine.  Chest, back, or muscle pain.  People around you feel you are not acting correctly or are confused.  Shortness of breath or difficulty breathing.  Dizziness and fainting.  You get a rash or develop hives.  You have a decrease in urine output.  Your urine turns a dark color or changes to pink, red, or brown. Any of the following symptoms occur over the next 10 days:  You have a temperature by mouth above 102 F (38.9 C), not controlled by medicine.  Shortness of breath.  Weakness after normal activity.  The white part of the eye turns yellow (jaundice).  You have a decrease in the amount of urine or are urinating less often.  Your urine turns a dark color or changes to pink, red, or brown. Document Released: 06/25/2000 Document Revised: 09/20/2011 Document Reviewed: 02/12/2008 ExitCare Patient Information 2014 [place].  _______________________________________________________________________  Incentive Spirometer  An incentive spirometer is a tool that can help keep your lungs clear and active. This tool measures how well you are filling your lungs with each breath. Taking long deep breaths may help reverse or decrease the chance of developing breathing (pulmonary) problems  (especially infection) following:  A long period of time when you are unable to move or be active. BEFORE THE PROCEDURE   If the spirometer includes an indicator to show your best effort, your nurse or respiratory therapist will set it to a desired goal.  If possible, sit up straight or lean slightly forward. Try not to slouch.  Hold the incentive spirometer in an upright position. INSTRUCTIONS FOR USE  1. Sit on the edge of your bed if possible, or sit up as far as you can in bed or on a chair. 2. Hold the incentive spirometer in an upright position. 3. Breathe out normally. 4. Place the mouthpiece in your mouth and seal your lips tightly around it. 5. Breathe in slowly and as deeply as possible, raising the piston or the ball toward the top of the column. 6. Hold your breath for 3-5 seconds or for as long as possible. Allow the piston or ball to fall to the bottom of the column. 7. Remove the mouthpiece from your mouth and breathe out normally. 8. Rest for a few seconds and repeat Steps 1 through 7 at least 10 times every 1-2 hours when you are awake. Take your time and take a few normal breaths between deep breaths. 9. The spirometer may include an indicator to show your best effort. Use the indicator as a goal to work toward during each repetition. 10. After each set of 10 deep breaths, practice coughing to be sure your lungs are clear. If you have an incision (the cut  made at the time of surgery), support your incision when coughing by placing a pillow or rolled up towels firmly against it. Once you are able to get out of bed, walk around indoors and cough well. You may stop using the incentive spirometer when instructed by your caregiver.  RISKS AND COMPLICATIONS  Take your time so you do not get dizzy or light-headed.  If you are in pain, you may need to take or ask for pain medication before doing incentive spirometry. It is harder to take a deep breath if you are having  pain. AFTER USE  Rest and breathe slowly and easily.  It can be helpful to keep track of a log of your progress. Your caregiver can provide you with a simple table to help with this. If you are using the spirometer at home, follow these instructions: [place] IF:   You are having difficultly using the spirometer.  You have trouble using the spirometer as often as instructed.  Your pain medication is not giving enough relief while using the spirometer.  You develop fever of 100.5 F (38.1 C) or higher. SEEK IMMEDIATE MEDICAL CARE IF:   You cough up bloody sputum that had not been present before.  You develop fever of 102 F (38.9 C) or greater.  You develop worsening pain at or near the incision site. MAKE SURE YOU:   Understand these instructions.  Will watch your condition.  Will get help right away if you are not doing well or get worse. Document Released: 11/08/2006 Document Revised: 09/20/2011 Document Reviewed: 01/09/2007 [facility] Patient Information 2014 [place], [place].   ________________________________________________________________________

## 2016-09-01 ENCOUNTER — Encounter (HOSPITAL_COMMUNITY)
Admission: RE | Admit: 2016-09-01 | Discharge: 2016-09-01 | Disposition: A | Discharge status: Home / Self Care | Payer: Medicare Other | Source: Ambulatory Visit | Attending: Orthopedic Surgery | Admitting: Orthopedic Surgery

## 2016-09-01 ENCOUNTER — Encounter (HOSPITAL_COMMUNITY): Payer: Self-pay

## 2016-09-01 DIAGNOSIS — E785 Hyperlipidemia, unspecified: Secondary | ICD-10-CM | POA: Diagnosis not present

## 2016-09-01 DIAGNOSIS — F329 Major depressive disorder, single episode, unspecified: Secondary | ICD-10-CM | POA: Diagnosis not present

## 2016-09-01 DIAGNOSIS — K219 Gastro-esophageal reflux disease without esophagitis: Secondary | ICD-10-CM | POA: Diagnosis not present

## 2016-09-01 DIAGNOSIS — R531 Weakness: Secondary | ICD-10-CM | POA: Insufficient documentation

## 2016-09-01 DIAGNOSIS — K589 Irritable bowel syndrome without diarrhea: Secondary | ICD-10-CM | POA: Diagnosis not present

## 2016-09-01 DIAGNOSIS — Z01812 Encounter for preprocedural laboratory examination: Secondary | ICD-10-CM | POA: Diagnosis not present

## 2016-09-01 DIAGNOSIS — M797 Fibromyalgia: Secondary | ICD-10-CM | POA: Insufficient documentation

## 2016-09-01 HISTORY — DX: Anxiety disorder, unspecified: F41.9

## 2016-09-01 HISTORY — DX: Sleep apnea, unspecified: G47.30

## 2016-09-01 LAB — CBC
HEMATOCRIT: 39.6 % (ref 36.0–46.0)
HEMOGLOBIN: 13.8 g/dL (ref 12.0–15.0)
MCH: 31.8 pg (ref 26.0–34.0)
MCHC: 34.8 g/dL (ref 30.0–36.0)
MCV: 91.2 fL (ref 78.0–100.0)
Platelets: 345 10*3/uL (ref 150–400)
RBC: 4.34 MIL/uL (ref 3.87–5.11)
RDW: 13.5 % (ref 11.5–15.5)
WBC: 7 10*3/uL (ref 4.0–10.5)

## 2016-09-01 LAB — COMPREHENSIVE METABOLIC PANEL
ALK PHOS: 70 U/L (ref 38–126)
ALT: 15 U/L (ref 14–54)
ANION GAP: 6 (ref 5–15)
AST: 17 U/L (ref 15–41)
Albumin: 4.3 g/dL (ref 3.5–5.0)
BILIRUBIN TOTAL: 0.5 mg/dL (ref 0.3–1.2)
BUN: 22 mg/dL — ABNORMAL HIGH (ref 6–20)
CALCIUM: 9.5 mg/dL (ref 8.9–10.3)
CO2: 26 mmol/L (ref 22–32)
Chloride: 108 mmol/L (ref 101–111)
Creatinine, Ser: 0.58 mg/dL (ref 0.44–1.00)
GFR calc non Af Amer: >60 mL/min (ref >60)
Glucose, Bld: 115 mg/dL — ABNORMAL HIGH (ref 65–99)
POTASSIUM: 4 mmol/L (ref 3.5–5.1)
Sodium: 140 mmol/L (ref 135–145)
TOTAL PROTEIN: 7.2 g/dL (ref 6.5–8.1)

## 2016-09-01 LAB — ABO/RH: ABO/RH(D): O NEG

## 2016-09-01 LAB — APTT: aPTT: 30 seconds (ref 24–36)

## 2016-09-01 LAB — SURGICAL PCR SCREEN
MRSA, PCR: NEGATIVE
Staphylococcus aureus: NEGATIVE

## 2016-09-01 LAB — PROTIME-INR
INR: 0.98
PROTHROMBIN TIME: 13 s (ref 11.4–15.2)

## 2016-09-01 NOTE — Progress Notes (Signed)
EKG-08/09/16-epic  CXR- 08/09/16-epic  Clearance- Melissa O'Sullivan No 08/09/2016 on chart

## 2016-09-05 ENCOUNTER — Other Ambulatory Visit: Payer: Self-pay | Admitting: Family

## 2016-09-05 ENCOUNTER — Ambulatory Visit: Payer: Self-pay | Admitting: Orthopedic Surgery

## 2016-09-05 NOTE — H&P (Signed)
[name] DOB: 09-Jan-1951 Married / Language: English / Race: White Female Date of Admission:  09/06/2016 CC:  Right Knee Pain History of Present Illness The patient is a 66 year old female who comes in for a preoperative History and Physical. The patient is scheduled for a right total knee arthroplasty to be performed by Dr. Dione Plover. Aluisio, MD at [facility] on 09-06-2016. The patient is a 66 year old female who presented for follow up of their knee. The patient is being followed for their right knee pain and osteoarthritis. They are now months out from Euflexxa series. Symptoms reported include: pain, aching, catching, instability and difficulty ambulating. The patient feels that they are doing poorly and report their pain level to be mild. The following medication has been used for pain control: Tylenol. The patient has reported improvement of their symptoms with: Cortisone injections and viscosupplementation. They helped some but for only a short amount of time. She is now at a stage where it bothers her bad enough and she wants to proceed with surgery at this time. They have been treated conservatively in the past for the above stated problem and despite conservative measures, they continue to have progressive pain and severe functional limitations and dysfunction. They have failed non-operative management including home exercise, medications, and injections. It is felt that they would benefit from undergoing total joint replacement. Risks and benefits of the procedure have been discussed with the patient and they elect to proceed with surgery. There are no active contraindications to surgery such as ongoing infection or rapidly progressive neurological disease.   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 Flagyl [08/22/2002]: GLUCOSAMINE [03/31/2007]: Oxycodone [08/22/2002]: PENICILLIN [08/22/2002]: TraZODone HCl *ANTIDEPRESSANTS*  Vicodin *ANALGESICS - OPIOID*  METRONIDAZOLE [08/22/2002]: Guaifenex-Rx DM *COUGH/COLD/ALLERGY*  Pentazocine-Naloxone HCl *ANALGESICS - OPIOID*  SMZ-TMP *ANTI-INFECTIVE AGENTS - MISC.*  TraMADol HCl *ANALGESICS - OPIOID*   Family History Osteoarthritis  mother Osteoporosis  mother and grandmother mothers side Cancer  mother and father Cerebrovascular Accident  grandfather mothers side Heart Disease  grandfather mothers side Hypertension  First Degree Relatives. mother and brother  Social History Tobacco use  Never smoker. 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 Gabapentin (100MG  Capsule, Oral) Active. (TID) Zantac (150MG  Tablet, Oral) Active. Aspirin (81MG  Tablet DR, Oral) Active. Benzoyl Peroxide (10% Gel, External as  needed) Active. Calcium Citrate + D (250-200MG -UNIT Tablet, Oral) Active. DULoxetine HCl (60MG  Capsule DR Part, Oral) Active. Fiber (600MG  Tablet, Oral) Active. Finacea (15% Gel, External) Active. Livalo (1MG  Tablet, Oral) Active. Montelukast Sodium (10MG  Tablet, Oral) Active. Multiple Vitamin (1 (one) Oral) Active. Nasal Mist (0.9% Aerosol Soln, Inhalation as needed) Active. Omeprazole (40MG  Capsule DR, Oral) Active. Probiotica (Oral) Active. Triamcinolone Acetonide (Top) (0.1% Ointment, External as needed) Active. Vitamin C (100MG  Tablet Chewable, Oral) Active. Vitamin D (1000UNIT Tablet, Oral) Active. Voltaren (1% Gel, Transdermal) Active.   Past Surgical History Foot Surgery  left Cataract Surgery  Date: 01/2012. right Straighten Nasal Septum  Date: 22. Tonsillectomy  Date: 43. Arthroscopy of Knee  Date: 05/1993. right Hysterectomy  Date: 05/1996. complete (non-cancerous) Carpal Tunnel Repair  bilateral; Right - 4/04, Left - 5/04 Trigger Thumb Surgery  Date: 01/2003. Arthroscopic Knee Surgery - Right  Date: 01/2006. Left Leg Varicose Vein Surgery  Date: 06/2006. Right Leg Vein Surgery  Date: 10/2007. Mallet Finger Right Middle  Date: 01/2019. Right Knee Surgery  Date: 08/2010. Nissen Fundoplication  Date: [id number]. Right Eye Laser Surgery  Date: 01/2011. Vitrectomy Eye Surgery  Date: 04/2011. Thumb [facility] Surgery  Date: 07/2013. Right Eye Surgery Scar Removal  Date: 01/2015. Lumbar Lam Surgery L3 and L4  Date: 02/2015. Left Eye Cataract Surgery  Date: 04/2016.   Review of Systems  General Present- Fatigue. Not Present- Chills, Fever, Memory Loss, Night Sweats, Weight Gain and Weight Loss. Skin Not Present- Eczema, Hives, Itching, Lesions and Rash. HEENT  Present- Headache. Not Present- Dentures, Double Vision, Hearing Loss, Tinnitus and Visual Loss. Respiratory Not Present- Allergies, Chronic Cough, Coughing up blood, Shortness of breath at rest and Shortness of breath with exertion. Cardiovascular Not Present- Chest Pain, Difficulty Breathing Lying Down, Murmur, Palpitations, Racing/skipping heartbeats and Swelling. Gastrointestinal Present- Difficulty Swallowing (has esophageal strictures) and Heartburn. Not Present- Abdominal Pain, Bloody Stool, Constipation, Diarrhea, Jaundice, Loss of appetitie, Nausea and Vomiting. Female Genitourinary Present- Urinating at Night. Not Present- Blood in Urine, Discharge, Flank Pain, Incontinence, Painful Urination, Urgency, Urinary frequency, Urinary Retention and Weak urinary stream. Musculoskeletal Present- Morning Stiffness and Muscle Pain. Not Present- Back Pain, Joint Pain, Joint Swelling, Muscle Weakness and Spasms. Neurological Not Present- Blackout spells, Difficulty with balance, Dizziness, Paralysis, Tremor and Weakness. Psychiatric Present- Insomnia.  Vitals  Weight: 165 lb Height: 64in Weight was reported by patient. Height was reported by patient. Body Surface Area: 1.8 m Body Mass Index: 28.32 kg/m  Pulse: 68 (Regular)  BP: 128/72 (Sitting, Left Arm, Standard)   Physical Exam General Mental Status -Alert, cooperative and good historian. General Appearance-pleasant, Not in acute distress. Orientation-Oriented X3. Build & Nutrition-Well nourished and Well developed.  Head and Neck Head-normocephalic, atraumatic . Neck Global Assessment - supple, no bruit auscultated on the right, no bruit auscultated on the left.  Eye Vision-Wears corrective lenses. Pupil - Bilateral-Regular and Round. Motion - Bilateral-EOMI.  Chest and Lung Exam Auscultation Breath sounds - clear at anterior chest wall and clear at posterior chest wall. Adventitious sounds - No  Adventitious sounds.  Cardiovascular Auscultation Rhythm - Regular rate and rhythm. Heart Sounds - S1 WNL and S2 WNL. Murmurs & Other Heart Sounds - Auscultation of the heart reveals - No Murmurs.  Abdomen Palpation/Percussion Tenderness - Abdomen is non-tender to palpation. Rigidity (guarding) - Abdomen is soft. Auscultation Auscultation of the abdomen reveals - Bowel sounds normal.  Female Genitourinary Note: Not done, not pertinent to present illness   Musculoskeletal Note: On exam, she  is in no distress. Her right knee shows no effusion. There is moderate crepitus on range of motion in the knee. She has some tenderness, lateral greater than medial and no instability.  Assessment & Plan  Primary osteoarthritis of right knee (M17.11)  Note:Surgical Plans: Right Total Knee Replacement  Disposition: Home and then In-Home VERA Therapy Postoperative  PCP: Debbrah Alar, FNP - Patient has been seen preoperatively and felt to be stable for surgery. "Step-down post-op due to OSA"  IV TXA  Anesthesia Issues: None  Patient was instructed on what medications to stop prior to surgery.  Signed electronically by Ok Edwards, III PA-C

## 2016-09-06 ENCOUNTER — Encounter (HOSPITAL_COMMUNITY): Payer: Self-pay

## 2016-09-06 ENCOUNTER — Encounter (HOSPITAL_COMMUNITY)
Admission: RE | Disposition: A | Discharge status: Home / Self Care | Payer: Self-pay | Source: Ambulatory Visit | Attending: Orthopedic Surgery

## 2016-09-06 ENCOUNTER — Inpatient Hospital Stay (HOSPITAL_COMMUNITY)
Admission: RE | Admit: 2016-09-06 | Discharge: 2016-09-08 | DRG: 470 | Disposition: A | Discharge status: Home / Self Care | Payer: Medicare Other | Source: Ambulatory Visit | Attending: Orthopedic Surgery | Admitting: Orthopedic Surgery

## 2016-09-06 ENCOUNTER — Inpatient Hospital Stay (HOSPITAL_COMMUNITY): Payer: Medicare Other | Admitting: Certified Registered Nurse Anesthetist

## 2016-09-06 DIAGNOSIS — E785 Hyperlipidemia, unspecified: Secondary | ICD-10-CM | POA: Diagnosis not present

## 2016-09-06 DIAGNOSIS — M1711 Unilateral primary osteoarthritis, right knee: Secondary | ICD-10-CM | POA: Diagnosis not present

## 2016-09-06 DIAGNOSIS — M1710 Unilateral primary osteoarthritis, unspecified knee: Secondary | ICD-10-CM | POA: Diagnosis present

## 2016-09-06 DIAGNOSIS — K219 Gastro-esophageal reflux disease without esophagitis: Secondary | ICD-10-CM | POA: Diagnosis not present

## 2016-09-06 DIAGNOSIS — E7800 Pure hypercholesterolemia, unspecified: Secondary | ICD-10-CM | POA: Diagnosis not present

## 2016-09-06 DIAGNOSIS — M25561 Pain in right knee: Secondary | ICD-10-CM | POA: Diagnosis not present

## 2016-09-06 DIAGNOSIS — M797 Fibromyalgia: Secondary | ICD-10-CM | POA: Diagnosis present

## 2016-09-06 DIAGNOSIS — M179 Osteoarthritis of knee, unspecified: Secondary | ICD-10-CM | POA: Diagnosis present

## 2016-09-06 DIAGNOSIS — G4733 Obstructive sleep apnea (adult) (pediatric): Secondary | ICD-10-CM | POA: Diagnosis present

## 2016-09-06 DIAGNOSIS — F329 Major depressive disorder, single episode, unspecified: Secondary | ICD-10-CM | POA: Diagnosis not present

## 2016-09-06 DIAGNOSIS — F418 Other specified anxiety disorders: Secondary | ICD-10-CM | POA: Diagnosis not present

## 2016-09-06 HISTORY — PX: TOTAL KNEE ARTHROPLASTY: SHX125

## 2016-09-06 LAB — TYPE AND SCREEN
ABO/RH(D): O NEG
Antibody Screen: NEGATIVE

## 2016-09-06 SURGERY — ARTHROPLASTY, KNEE, TOTAL
Anesthesia: Monitor Anesthesia Care | Site: Knee | Laterality: Right

## 2016-09-06 MED ORDER — SODIUM CHLORIDE 0.9 % IV SOLN
INTRAVENOUS | Status: DC
  Administered 2016-09-06: 12:00:00 via INTRAVENOUS

## 2016-09-06 MED ORDER — SODIUM CHLORIDE 0.9 % IR SOLN
Status: DC | PRN
  Administered 2016-09-06: 1000 mL

## 2016-09-06 MED ORDER — PANTOPRAZOLE SODIUM 40 MG PO TBEC
80.0000 mg | DELAYED_RELEASE_TABLET | Freq: Every day | ORAL | Status: DC
  Administered 2016-09-06: 40 mg via ORAL
  Filled 2016-09-06 (×2): qty 2

## 2016-09-06 MED ORDER — DOCUSATE SODIUM 100 MG PO CAPS
100.0000 mg | ORAL_CAPSULE | Freq: Two times a day (BID) | ORAL | Status: DC
  Administered 2016-09-06 – 2016-09-08 (×5): 100 mg via ORAL
  Filled 2016-09-06 (×5): qty 1

## 2016-09-06 MED ORDER — SODIUM CHLORIDE 0.9 % IJ SOLN
INTRAMUSCULAR | Status: AC
  Filled 2016-09-06: qty 50

## 2016-09-06 MED ORDER — VANCOMYCIN HCL IN DEXTROSE 1-5 GM/200ML-% IV SOLN
1000.0000 mg | Freq: Two times a day (BID) | INTRAVENOUS | Status: AC
  Administered 2016-09-06: 1000 mg via INTRAVENOUS
  Filled 2016-09-06: qty 200

## 2016-09-06 MED ORDER — PRAVASTATIN SODIUM 20 MG PO TABS
10.0000 mg | ORAL_TABLET | Freq: Every day | ORAL | Status: DC
  Administered 2016-09-06 – 2016-09-07 (×2): 10 mg via ORAL
  Filled 2016-09-06 (×2): qty 1

## 2016-09-06 MED ORDER — DULOXETINE HCL 60 MG PO CPEP
60.0000 mg | ORAL_CAPSULE | Freq: Every day | ORAL | Status: DC
  Administered 2016-09-07 – 2016-09-08 (×2): 60 mg via ORAL
  Filled 2016-09-06 (×2): qty 1

## 2016-09-06 MED ORDER — ACETAMINOPHEN 650 MG RE SUPP: 650.0000 mg | Freq: Four times a day (QID) | RECTAL | Status: DC | PRN

## 2016-09-06 MED ORDER — ACETAMINOPHEN 325 MG PO TABS
650.0000 mg | ORAL_TABLET | Freq: Four times a day (QID) | ORAL | Status: DC | PRN
  Administered 2016-09-07: 650 mg via ORAL
  Filled 2016-09-06: qty 2

## 2016-09-06 MED ORDER — DEXAMETHASONE SODIUM PHOSPHATE 10 MG/ML IJ SOLN
10.0000 mg | Freq: Once | INTRAMUSCULAR | Status: AC
  Administered 2016-09-07: 10 mg via INTRAVENOUS
  Filled 2016-09-06: qty 1

## 2016-09-06 MED ORDER — DEXAMETHASONE SODIUM PHOSPHATE 10 MG/ML IJ SOLN
10.0000 mg | Freq: Once | INTRAMUSCULAR | Status: AC
  Administered 2016-09-06: 10 mg via INTRAVENOUS

## 2016-09-06 MED ORDER — DIPHENHYDRAMINE HCL 50 MG/ML IJ SOLN
INTRAMUSCULAR | Status: AC
  Filled 2016-09-06: qty 1

## 2016-09-06 MED ORDER — DEXAMETHASONE SODIUM PHOSPHATE 10 MG/ML IJ SOLN
INTRAMUSCULAR | Status: AC
  Filled 2016-09-06: qty 1

## 2016-09-06 MED ORDER — TRANEXAMIC ACID 1000 MG/10ML IV SOLN
1000.0000 mg | Freq: Once | INTRAVENOUS | Status: AC
  Administered 2016-09-06: 13:00:00 1000 mg via INTRAVENOUS
  Filled 2016-09-06: qty 1100

## 2016-09-06 MED ORDER — LACTATED RINGERS IV SOLN
INTRAVENOUS | Status: DC
  Administered 2016-09-06 (×3): via INTRAVENOUS

## 2016-09-06 MED ORDER — BUPIVACAINE IN DEXTROSE 0.75-8.25 % IT SOLN
INTRATHECAL | Status: DC | PRN
  Administered 2016-09-06: 15 mg via INTRATHECAL

## 2016-09-06 MED ORDER — BUPIVACAINE HCL (PF) 0.25 % IJ SOLN
INTRAMUSCULAR | Status: AC
  Filled 2016-09-06: qty 30

## 2016-09-06 MED ORDER — HYDROMORPHONE HCL 1 MG/ML IJ SOLN: 0.2500 mg | INTRAMUSCULAR | Status: DC | PRN

## 2016-09-06 MED ORDER — GABAPENTIN 300 MG PO CAPS
300.0000 mg | ORAL_CAPSULE | Freq: Three times a day (TID) | ORAL | Status: DC
  Administered 2016-09-06 (×2): 300 mg via ORAL
  Filled 2016-09-06 (×4): qty 1

## 2016-09-06 MED ORDER — PROPOFOL 500 MG/50ML IV EMUL
INTRAVENOUS | Status: DC | PRN
  Administered 2016-09-06: 75 ug/kg/min via INTRAVENOUS

## 2016-09-06 MED ORDER — BUPIVACAINE LIPOSOME 1.3 % IJ SUSP
20.0000 mL | Freq: Once | INTRAMUSCULAR | Status: DC
  Filled 2016-09-06: qty 20

## 2016-09-06 MED ORDER — ONDANSETRON HCL 4 MG PO TABS: 4.0000 mg | ORAL_TABLET | Freq: Four times a day (QID) | ORAL | Status: DC | PRN

## 2016-09-06 MED ORDER — BUPIVACAINE LIPOSOME 1.3 % IJ SUSP
INTRAMUSCULAR | Status: DC | PRN
  Administered 2016-09-06: 20 mL

## 2016-09-06 MED ORDER — PROPOFOL 10 MG/ML IV BOLUS
INTRAVENOUS | Status: AC
  Filled 2016-09-06: qty 40

## 2016-09-06 MED ORDER — MIDAZOLAM HCL 5 MG/5ML IJ SOLN
INTRAMUSCULAR | Status: DC | PRN
  Administered 2016-09-06: 2 mg via INTRAVENOUS

## 2016-09-06 MED ORDER — DEXAMETHASONE SODIUM PHOSPHATE 4 MG/ML IJ SOLN
INTRAMUSCULAR | Status: DC | PRN
  Administered 2016-09-06: 10 mg via INTRAVENOUS

## 2016-09-06 MED ORDER — MENTHOL 3 MG MT LOZG: 1.0000 | LOZENGE | OROMUCOSAL | Status: DC | PRN

## 2016-09-06 MED ORDER — ROPIVACAINE HCL 7.5 MG/ML IJ SOLN
INTRAMUSCULAR | Status: DC | PRN
  Administered 2016-09-06: 20 mL via PERINEURAL

## 2016-09-06 MED ORDER — DIPHENHYDRAMINE HCL 50 MG/ML IJ SOLN
INTRAMUSCULAR | Status: DC | PRN
  Administered 2016-09-06: 25 mg via INTRAVENOUS

## 2016-09-06 MED ORDER — BUPIVACAINE HCL (PF) 0.75 % IJ SOLN
INTRAMUSCULAR | Status: DC | PRN
  Administered 2016-09-06: 2 mL via INTRATHECAL

## 2016-09-06 MED ORDER — FLEET ENEMA 7-19 GM/118ML RE ENEM: 1.0000 | ENEMA | Freq: Once | RECTAL | Status: DC | PRN

## 2016-09-06 MED ORDER — METHOCARBAMOL 500 MG PO TABS
500.0000 mg | ORAL_TABLET | Freq: Four times a day (QID) | ORAL | Status: DC | PRN
  Administered 2016-09-07 – 2016-09-08 (×3): 500 mg via ORAL
  Filled 2016-09-06 (×3): qty 1

## 2016-09-06 MED ORDER — SODIUM CHLORIDE 0.9 % IJ SOLN
INTRAMUSCULAR | Status: DC | PRN
  Administered 2016-09-06: 50 mL

## 2016-09-06 MED ORDER — LORATADINE 10 MG PO TABS: 10.0000 mg | ORAL_TABLET | Freq: Every day | ORAL | Status: DC | PRN

## 2016-09-06 MED ORDER — TRANEXAMIC ACID 1000 MG/10ML IV SOLN
1000.0000 mg | INTRAVENOUS | Status: AC
  Administered 2016-09-06: 1000 mg via INTRAVENOUS
  Filled 2016-09-06: qty 1100

## 2016-09-06 MED ORDER — BISACODYL 10 MG RE SUPP: 10.0000 mg | Freq: Every day | RECTAL | Status: DC | PRN

## 2016-09-06 MED ORDER — HYDROMORPHONE HCL 1 MG/ML IJ SOLN: 0.5000 mg | INTRAMUSCULAR | Status: DC | PRN

## 2016-09-06 MED ORDER — LIDOCAINE 2% (20 MG/ML) 5 ML SYRINGE
INTRAMUSCULAR | Status: AC
  Filled 2016-09-06: qty 5

## 2016-09-06 MED ORDER — PHENOL 1.4 % MT LIQD: 1.0000 | OROMUCOSAL | Status: DC | PRN

## 2016-09-06 MED ORDER — CHLORHEXIDINE GLUCONATE 4 % EX LIQD: 60.0000 mL | Freq: Once | CUTANEOUS | Status: DC

## 2016-09-06 MED ORDER — METOCLOPRAMIDE HCL 5 MG/ML IJ SOLN
5.0000 mg | Freq: Three times a day (TID) | INTRAMUSCULAR | Status: DC | PRN
Start: 2016-09-06 — End: 2016-09-08

## 2016-09-06 MED ORDER — PROMETHAZINE HCL 25 MG/ML IJ SOLN: 6.2500 mg | INTRAMUSCULAR | Status: DC | PRN

## 2016-09-06 MED ORDER — METOCLOPRAMIDE HCL 5 MG PO TABS: 5.0000 mg | ORAL_TABLET | Freq: Three times a day (TID) | ORAL | Status: DC | PRN

## 2016-09-06 MED ORDER — ONDANSETRON HCL 4 MG/2ML IJ SOLN
4.0000 mg | Freq: Four times a day (QID) | INTRAMUSCULAR | Status: DC | PRN
  Administered 2016-09-06: 16:00:00 4 mg via INTRAVENOUS
  Filled 2016-09-06: qty 2

## 2016-09-06 MED ORDER — PROPOFOL 10 MG/ML IV BOLUS
INTRAVENOUS | Status: AC
  Filled 2016-09-06: qty 20

## 2016-09-06 MED ORDER — LIDOCAINE HCL (CARDIAC) 20 MG/ML IV SOLN
INTRAVENOUS | Status: DC | PRN
  Administered 2016-09-06: 50 mg via INTRAVENOUS

## 2016-09-06 MED ORDER — ACETAMINOPHEN 500 MG PO TABS
1000.0000 mg | ORAL_TABLET | Freq: Four times a day (QID) | ORAL | Status: AC
  Administered 2016-09-06 – 2016-09-07 (×4): 1000 mg via ORAL
  Filled 2016-09-06 (×4): qty 2

## 2016-09-06 MED ORDER — RIVAROXABAN 10 MG PO TABS
10.0000 mg | ORAL_TABLET | Freq: Every day | ORAL | Status: DC
  Administered 2016-09-07 – 2016-09-08 (×2): 10 mg via ORAL
  Filled 2016-09-06 (×2): qty 1

## 2016-09-06 MED ORDER — DEXTROSE 5 % IV SOLN
500.0000 mg | Freq: Four times a day (QID) | INTRAVENOUS | Status: DC | PRN
  Filled 2016-09-06: qty 5

## 2016-09-06 MED ORDER — HYDROMORPHONE HCL 2 MG PO TABS
2.0000 mg | ORAL_TABLET | ORAL | Status: DC | PRN
  Administered 2016-09-06: 12:00:00 2 mg via ORAL
  Administered 2016-09-06 – 2016-09-08 (×10): 4 mg via ORAL
  Filled 2016-09-06 (×3): qty 2
  Filled 2016-09-06: qty 1
  Filled 2016-09-06 (×8): qty 2

## 2016-09-06 MED ORDER — DIPHENHYDRAMINE HCL 12.5 MG/5ML PO ELIX
12.5000 mg | ORAL_SOLUTION | ORAL | Status: DC | PRN
Start: 2016-09-06 — End: 2016-09-08

## 2016-09-06 MED ORDER — POLYETHYLENE GLYCOL 3350 17 G PO PACK
17.0000 g | PACK | Freq: Every day | ORAL | Status: DC | PRN
  Administered 2016-09-08: 10:00:00 17 g via ORAL

## 2016-09-06 MED ORDER — BENZONATATE 100 MG PO CAPS: 100.0000 mg | ORAL_CAPSULE | Freq: Three times a day (TID) | ORAL | Status: DC | PRN

## 2016-09-06 MED ORDER — MIDAZOLAM HCL 2 MG/2ML IJ SOLN
INTRAMUSCULAR | Status: AC
  Filled 2016-09-06: qty 2

## 2016-09-06 MED ORDER — VANCOMYCIN HCL IN DEXTROSE 1-5 GM/200ML-% IV SOLN
1000.0000 mg | INTRAVENOUS | Status: AC
  Administered 2016-09-06: 1000 mg via INTRAVENOUS
  Filled 2016-09-06: qty 200

## 2016-09-06 SURGICAL SUPPLY — 55 items
BAG DECANTER FOR FLEXI CONT (MISCELLANEOUS) ×3 IMPLANT
BAG SPEC THK2 15X12 ZIP CLS (MISCELLANEOUS) ×1
BAG ZIPLOCK 12X15 (MISCELLANEOUS) ×3 IMPLANT
BANDAGE ACE 6X5 VEL STRL LF (GAUZE/BANDAGES/DRESSINGS) ×3 IMPLANT
BLADE SAG 18X100X1.27 (BLADE) ×3 IMPLANT
BLADE SAW SGTL 11.0X1.19X90.0M (BLADE) ×3 IMPLANT
BOWL SMART MIX CTS (DISPOSABLE) ×3 IMPLANT
CAPT KNEE TOTAL 3 ATTUNE ×2 IMPLANT
CEMENT HV SMART SET (Cement) ×4 IMPLANT
CLOSURE WOUND 1/2 X4 (GAUZE/BANDAGES/DRESSINGS) ×1
CLOTH BEACON ORANGE TIMEOUT ST (SAFETY) ×3 IMPLANT
CUFF TOURN SGL QUICK 34 (TOURNIQUET CUFF) ×3
CUFF TRNQT CYL 34X4X40X1 (TOURNIQUET CUFF) ×1 IMPLANT
DECANTER SPIKE VIAL GLASS SM (MISCELLANEOUS) ×3 IMPLANT
DRAPE U-SHAPE 47X51 STRL (DRAPES) ×3 IMPLANT
DRSG ADAPTIC 3X8 NADH LF (GAUZE/BANDAGES/DRESSINGS) ×3 IMPLANT
DRSG PAD ABDOMINAL 8X10 ST (GAUZE/BANDAGES/DRESSINGS) ×3 IMPLANT
DURAPREP 26ML APPLICATOR (WOUND CARE) ×3 IMPLANT
ELECT REM PT RETURN 9FT ADLT (ELECTROSURGICAL) ×3
ELECTRODE REM PT RTRN 9FT ADLT (ELECTROSURGICAL) ×1 IMPLANT
EVACUATOR 1/8 PVC DRAIN (DRAIN) ×3 IMPLANT
GAUZE SPONGE 4X4 12PLY STRL (GAUZE/BANDAGES/DRESSINGS) ×3 IMPLANT
GLOVE BIO SURGEON STRL SZ7.5 (GLOVE) IMPLANT
GLOVE BIO SURGEON STRL SZ8 (GLOVE) ×1 IMPLANT
GLOVE BIOGEL PI IND STRL 6.5 (GLOVE) IMPLANT
GLOVE BIOGEL PI IND STRL 7.5 (GLOVE) IMPLANT
GLOVE BIOGEL PI IND STRL 8 (GLOVE) ×1 IMPLANT
GLOVE BIOGEL PI INDICATOR 6.5 (GLOVE)
GLOVE BIOGEL PI INDICATOR 7.5 (GLOVE) ×16
GLOVE BIOGEL PI INDICATOR 8 (GLOVE)
GLOVE SURG SS PI 6.5 STRL IVOR (GLOVE) IMPLANT
GOWN STRL REUS W/TWL LRG LVL3 (GOWN DISPOSABLE) ×7 IMPLANT
GOWN STRL REUS W/TWL XL LVL3 (GOWN DISPOSABLE) ×3 IMPLANT
HANDPIECE INTERPULSE COAX TIP (DISPOSABLE) ×3
IMMOBILIZER KNEE 20 (SOFTGOODS) ×3
IMMOBILIZER KNEE 20 THIGH 36 (SOFTGOODS) ×1 IMPLANT
MANIFOLD NEPTUNE II (INSTRUMENTS) ×3 IMPLANT
NS IRRIG 1000ML POUR BTL (IV SOLUTION) ×3 IMPLANT
PACK TOTAL KNEE CUSTOM (KITS) ×3 IMPLANT
PADDING CAST COTTON 6X4 STRL (CAST SUPPLIES) ×5 IMPLANT
POSITIONER SURGICAL ARM (MISCELLANEOUS) ×3 IMPLANT
SET HNDPC FAN SPRY TIP SCT (DISPOSABLE) ×1 IMPLANT
STRIP CLOSURE SKIN 1/2X4 (GAUZE/BANDAGES/DRESSINGS) ×3 IMPLANT
SUT MNCRL AB 4-0 PS2 18 (SUTURE) ×3 IMPLANT
SUT STRATAFIX 0 PDS 27 VIOLET (SUTURE) ×3
SUT VIC AB 2-0 CT1 27 (SUTURE) ×9
SUT VIC AB 2-0 CT1 TAPERPNT 27 (SUTURE) ×3 IMPLANT
SUT VLOC 180 0 24IN GS25 (SUTURE) IMPLANT
SUTURE STRATFX 0 PDS 27 VIOLET (SUTURE) ×1 IMPLANT
SYR 50ML LL SCALE MARK (SYRINGE) ×3 IMPLANT
TRAY FOLEY METER SIL LF 16FR (CATHETERS) ×2 IMPLANT
TRAY FOLEY W/METER SILVER 16FR (SET/KITS/TRAYS/PACK) ×3 IMPLANT
WATER STERILE IRR 1000ML POUR (IV SOLUTION) ×6 IMPLANT
WRAP KNEE MAXI GEL POST OP (GAUZE/BANDAGES/DRESSINGS) ×3 IMPLANT
YANKAUER SUCT BULB TIP 10FT TU (MISCELLANEOUS) ×3 IMPLANT

## 2016-09-06 NOTE — Transfer of Care (Signed)
Immediate Anesthesia Transfer of Care Note  Patient: Vanessa Oliver  Procedure(s) Performed: Procedure(s): RIGHT TOTAL KNEE ARTHROPLASTY (Right)  Patient Location: PACU  Anesthesia Type:Spinal  Level of Consciousness: awake, alert  and oriented  Airway & Oxygen Therapy: Patient Spontanous Breathing and Patient connected to nasal cannula oxygen  Post-op Assessment: Report given to RN and Post -op Vital signs reviewed and stable  Post vital signs: Reviewed and stable  Last Vitals:  Vitals:   09/06/16 0555  BP: 132/69  Pulse: 76  Resp: 18  Temp: 37 C    Last Pain:  Vitals:   09/06/16 0628  TempSrc:   PainSc: 5       Patients Stated Pain Goal: 4 ([id number])  Complications: No apparent anesthesia complications

## 2016-09-06 NOTE — Anesthesia Postprocedure Evaluation (Addendum)
Anesthesia Post Note  Patient: Vanessa Oliver  Procedure(s) Performed: Procedure(s) (LRB): RIGHT TOTAL KNEE ARTHROPLASTY (Right)  Patient location during evaluation: PACU Anesthesia Type: MAC Level of consciousness: oriented and awake and alert Pain management: pain level controlled Vital Signs Assessment: post-procedure vital signs reviewed and stable Respiratory status: spontaneous breathing, respiratory function stable and patient connected to nasal cannula oxygen Cardiovascular status: blood pressure returned to baseline and stable Postop Assessment: no headache and no backache Anesthetic complications: no       Last Vitals:  Vitals:   09/06/16 1015 09/06/16 1030  BP: (!) 150/79 140/80  Pulse: 70 73  Resp: 13 15  Temp:  36.5 C    Last Pain:  Vitals:   09/06/16 1000  TempSrc:   PainSc: 0-No pain                 Asia Dusenbury S

## 2016-09-06 NOTE — Evaluation (Signed)
Physical Therapy Evaluation Patient Details Name: Vanessa Oliver MRN: [id number] DOB: 12/11/1950 Today's Date: 09/06/2016   History of Present Illness  Pt s/p R TKR and with hx of fibromyalgia and chronic back pain  Clinical Impression  Pt s/p R TKR and presents with decreased R LE strength/ROM and pain limiting functional mobility.  Pt should progress to dc home with assist of family and reports virtual HHPT has been initiated prior to surgery.    Follow Up Recommendations Home health PT (Pt reports virtual HHPT set up)    Equipment Recommendations  Rolling walker with 5" wheels    Recommendations for Other Services OT consult     Precautions / Restrictions Precautions Precautions: Knee;Fall Required Braces or Orthoses: Knee Immobilizer - Right Knee Immobilizer - Right: Discontinue once straight leg raise with < 10 degree lag Restrictions Weight Bearing Restrictions: No Other Position/Activity Restrictions: WBAT      Mobility  Bed Mobility Overal bed mobility: Needs Assistance Bed Mobility: Supine to Sit     Supine to sit: Min assist     General bed mobility comments: cues for sequence and use of L LE to self assist  Transfers Overall transfer level: Needs assistance Equipment used: Rolling walker (2 wheeled) Transfers: Sit to/from Stand Sit to Stand: Min assist         General transfer comment: cues for LE management and use of UEs to self assist  Ambulation/Gait Ambulation/Gait assistance: Min assist Ambulation Distance (Feet): 6 Feet Assistive device: Rolling walker (2 wheeled) Gait Pattern/deviations: Step-to pattern;Decreased step length - right;Decreased step length - left;Shuffle;Trunk flexed Gait velocity: decr Gait velocity interpretation: Below normal speed for age/gender General Gait Details: cues for sequence, posture and position from RW.  Distance ltd by onset nausea and dizziness  Stairs            Wheelchair Mobility     Modified Rankin (Stroke Patients Only)       Balance                                             Pertinent Vitals/Pain Pain Assessment: 0-10 Pain Score: 8  Pain Location: R knee Pain Descriptors / Indicators: Aching;Sore Pain Intervention(s): Limited activity within patient's tolerance;Monitored during session;Premedicated before session;Patient requesting pain meds-RN notified;Ice applied    Home Living Family/patient expects to be discharged to:: Private residence Living Arrangements: Spouse/significant other Available Help at Discharge: Family Type of Home: House Home Access: Stairs to enter   Technical brewer of Steps: 1 Home Layout: One level Home Equipment: Environmental consultant - 2 wheels;Bedside commode;Cane - single point      Prior Function Level of Independence: Independent               Hand Dominance        Extremity/Trunk Assessment   Upper Extremity Assessment Upper Extremity Assessment: Overall WFL for tasks assessed    Lower Extremity Assessment Lower Extremity Assessment: RLE deficits/detail    Cervical / Trunk Assessment Cervical / Trunk Assessment: Normal  Communication   Communication: No difficulties  Cognition Arousal/Alertness: Awake/alert Behavior During Therapy: WFL for tasks assessed/performed Overall Cognitive Status: Within Functional Limits for tasks assessed                      General Comments      Exercises     Assessment/Plan  PT Assessment Patient needs continued PT services  PT Problem List Decreased strength;Decreased range of motion;Decreased activity tolerance;Decreased mobility;Decreased knowledge of use of DME;Pain       PT Treatment Interventions DME instruction;Gait training;Stair training;Functional mobility training;Therapeutic activities;Therapeutic exercise;Patient/family education    PT Goals (Current goals can be found in the Care Plan section)  Acute Rehab PT  Goals Patient Stated Goal: Regain IND PT Goal Formulation: With patient Time For Goal Achievement: 09/10/16 Potential to Achieve Goals: Good    Frequency 7X/week   Barriers to discharge        Co-evaluation               End of Session Equipment Utilized During Treatment: Gait belt;Right knee immobilizer Activity Tolerance: Patient limited by fatigue;Patient limited by pain;Other (comment) (nausea) Patient left: in chair;with call bell/phone within reach;with nursing/sitter in room Nurse Communication: Mobility status PT Visit Diagnosis: Difficulty in walking, not elsewhere classified (R26.2)         Time: [id number] PT Time Calculation (min) (ACUTE ONLY): 25 min   Charges:   PT Evaluation $PT Eval Low Complexity: 1 Procedure PT Treatments $Gait Training: 8-22 mins   PT G Codes:         Ardelia Wrede 09/06/2016, 4:59 PM

## 2016-09-06 NOTE — Op Note (Signed)
OPERATIVE REPORT-TOTAL KNEE ARTHROPLASTY   Pre-operative diagnosis- Osteoarthritis  Right knee(s)  Post-operative diagnosis- Osteoarthritis Right knee(s)  Procedure-  Right  Total Knee Arthroplasty  Surgeon- Dione Plover. Cecilia Nishikawa, MD  Assistant- Arlee Muslim, PA-C   Anesthesia-  Adductor canal block and spinal  EBL-* No blood loss amount entered *   Drains Hemovac  Tourniquet time-  Total Tourniquet Time Documented: Thigh (Right) - 30 minutes Total: Thigh (Right) - 30 minutes     Complications- None  Condition-PACU - hemodynamically stable.   Brief Clinical Note  Vanessa Oliver is a 66 y.o. year old female with end stage OA of her right knee with progressively worsening pain and dysfunction. She has constant pain, with activity and at rest and significant functional deficits with difficulties even with ADLs. She has had extensive non-op management including analgesics, injections of cortisone and viscosupplements, and home exercise program, but remains in significant pain with significant dysfunction.Radiographs show bone on bone arthritis medial and patellofemoral. She presents now for right Total Knee Arthroplasty.    Procedure in detail---   The patient is brought into the operating room and positioned supine on the operating table. After successful administration of  Adductor canal block and spinal,   a tourniquet is placed high on the  Right thigh(s) and the lower extremity is prepped and draped in the usual sterile fashion. Time out is performed by the operating team and then the  Right lower extremity is wrapped in Esmarch, knee flexed and the tourniquet inflated to 300 mmHg.       A midline incision is made with a ten blade through the subcutaneous tissue to the level of the extensor mechanism. A fresh blade is used to make a medial parapatellar arthrotomy. Soft tissue over the proximal medial tibia is subperiosteally elevated to the joint line with a knife and into the  semimembranosus bursa with a Cobb elevator. Soft tissue over the proximal lateral tibia is elevated with attention being paid to avoiding the patellar tendon on the tibial tubercle. The patella is everted, knee flexed 90 degrees and the ACL and PCL are removed. Findings are bone on bone medial and patellofemoral with large global osteophytes.        The drill is used to create a starting hole in the distal femur and the canal is thoroughly irrigated with sterile saline to remove the fatty contents. The 5 degree Right  valgus alignment guide is placed into the femoral canal and the distal femoral cutting block is pinned to remove 10 mm off the distal femur. Resection is made with an oscillating saw.      The tibia is subluxed forward and the menisci are removed. The extramedullary alignment guide is placed referencing proximally at the medial aspect of the tibial tubercle and distally along the second metatarsal axis and tibial crest. The block is pinned to remove 15mm off the more deficient medial  side. Resection is made with an oscillating saw. Size 5is the most appropriate size for the tibia and the proximal tibia is prepared with the modular drill and keel punch for that size.      The femoral sizing guide is placed and size 6 is most appropriate. Rotation is marked off the epicondylar axis and confirmed by creating a rectangular flexion gap at 90 degrees. The size 6 cutting block is pinned in this rotation and the anterior, posterior and chamfer cuts are made with the oscillating saw. The intercondylar block is then placed and that  cut is made.      Trial size 5 tibial component, trial size 6 narrow posterior stabilized femur and a 6  mm posterior stabilized rotating platform insert trial is placed. Full extension is achieved with excellent varus/valgus and anterior/posterior balance throughout full range of motion. The patella is everted and thickness measured to be 22  mm. Free hand resection is taken to  12 mm, a 35 template is placed, lug holes are drilled, trial patella is placed, and it tracks normally. Osteophytes are removed off the posterior femur with the trial in place. All trials are removed and the cut bone surfaces prepared with pulsatile lavage. Cement is mixed and once ready for implantation, the size 5 tibial implant, size  6 narrow posterior stabilized femoral component, and the size 35 patella are cemented in place and the patella is held with the clamp. The trial insert is placed and the knee held in full extension. The Exparel (20 ml mixed with 30 ml saline) and .25% Bupivicaine, are injected into the extensor mechanism, posterior capsule, medial and lateral gutters and subcutaneous tissues.  All extruded cement is removed and once the cement is hard the permanent 6 mm posterior stabilized rotating platform insert is placed into the tibial tray.      The wound is copiously irrigated with saline solution and the extensor mechanism closed over a hemovac drain with #1 V-loc suture. The tourniquet is released for a total tourniquet time of 30  minutes. Flexion against gravity is 140 degrees and the patella tracks normally. Subcutaneous tissue is closed with 2.0 vicryl and subcuticular with running 4.0 Monocryl. The incision is cleaned and dried and steri-strips and a bulky sterile dressing are applied. The limb is placed into a knee immobilizer and the patient is awakened and transported to recovery in stable condition.      Please note that a surgical assistant was a medical necessity for this procedure in order to perform it in a safe and expeditious manner. Surgical assistant was necessary to retract the ligaments and vital neurovascular structures to prevent injury to them and also necessary for proper positioning of the limb to allow for anatomic placement of the prosthesis.   Dione Plover Wissam Resor, MD    09/06/2016, 9:05 AM

## 2016-09-06 NOTE — Anesthesia Procedure Notes (Signed)
Spinal  Patient location during procedure: OR Start time: 09/06/2016 8:11 AM End time: 09/06/2016 8:11 AM Staffing Anesthesiologist: Manolito Jurewicz, Iona Beard Performed: anesthesiologist  Preanesthetic Checklist Completed: patient identified, site marked, surgical consent, pre-op evaluation, timeout performed, IV checked, risks and benefits discussed and monitors and equipment checked Spinal Block Patient position: sitting Prep: Betadine Patient monitoring: heart rate, continuous pulse ox and blood pressure Injection technique: single-shot Needle Needle type: Sprotte  Needle gauge: 24 G Needle length: 9 cm Additional Notes Expiration date of kit checked and confirmed. Patient tolerated procedure well, without complications.

## 2016-09-06 NOTE — Anesthesia Procedure Notes (Signed)
Anesthesia Regional Block: Adductor canal block   Pre-Anesthetic Checklist: ,, timeout performed, Correct Patient, Correct Site, Correct Laterality, Correct Procedure, Correct Position, site marked, Risks and benefits discussed,  Surgical consent,  Pre-op evaluation,  At surgeon's request and post-op pain management  Laterality: Right  Prep: chloraprep       Needles:  Injection technique: Single-shot  Needle Type: Echogenic Needle     Needle Length: 9cm      Additional Needles:   Procedures: ultrasound guided,,,,,,,,  Narrative:  Start time: 09/06/2016 7:33 AM End time: 09/06/2016 7:43 AM Injection made incrementally with aspirations every 5 mL.  Performed by: Personally  Anesthesiologist: Idalys Konecny  Additional Notes: Patient tolerated the procedure well without complications

## 2016-09-06 NOTE — Anesthesia Preprocedure Evaluation (Signed)
Anesthesia Evaluation  Patient identified by MRN, date of birth, ID band Patient awake    Reviewed: Allergy & Precautions, NPO status , Patient's Chart, lab work & pertinent test results  Airway Mallampati: II  TM Distance: >3 FB Neck ROM: Full    Dental no notable dental hx.    Pulmonary sleep apnea and Continuous Positive Airway Pressure Ventilation ,    Pulmonary exam normal breath sounds clear to auscultation       Cardiovascular negative cardio ROS Normal cardiovascular exam Rhythm:Regular Rate:Normal     Neuro/Psych negative neurological ROS  negative psych ROS   GI/Hepatic negative GI ROS, Neg liver ROS,   Endo/Other  negative endocrine ROS  Renal/GU negative Renal ROS  negative genitourinary   Musculoskeletal negative musculoskeletal ROS (+)   Abdominal   Peds negative pediatric ROS (+)  Hematology negative hematology ROS (+)   Anesthesia Other Findings   Reproductive/Obstetrics negative OB ROS                             Anesthesia Physical Anesthesia Plan  ASA: II  Anesthesia Plan: MAC   Post-op Pain Management:    Induction: Intravenous  Airway Management Planned: Nasal Cannula  Additional Equipment:   Intra-op Plan:   Post-operative Plan:   Informed Consent: I have reviewed the patients History and Physical, chart, labs and discussed the procedure including the risks, benefits and alternatives for the proposed anesthesia with the patient or authorized representative who has indicated his/her understanding and acceptance.   Dental advisory given  Plan Discussed with: CRNA and Surgeon  Anesthesia Plan Comments:         Anesthesia Quick Evaluation

## 2016-09-06 NOTE — Interval H&P Note (Signed)
History and Physical Interval Note:  09/06/2016 6:56 AM  Vanessa Oliver  has presented today for surgery, with the diagnosis of RIGHT KNEE OA  The various methods of treatment have been discussed with the patient and family. After consideration of risks, benefits and other options for treatment, the patient has consented to  Procedure(s): RIGHT TOTAL KNEE ARTHROPLASTY (Right) as a surgical intervention .  The patient's history has been reviewed, patient examined, no change in status, stable for surgery.  I have reviewed the patient's chart and labs.  Questions were answered to the patient's satisfaction.     Gearlean Alf

## 2016-09-06 NOTE — H&P (View-Only) (Signed)
Vanessa Oliver DOB: 1950/08/29 Married / Language: English / Race: White Female Date of Admission:  09/06/2016 CC:  Right Knee Pain History of Present Illness The patient is a 66 year old female who comes in for a preoperative History and Physical. The patient is scheduled for a right total knee arthroplasty to be performed by Dr. Dione Plover. Aluisio, MD at [facility] on 09-06-2016. The patient is a 66 year old female who presented for follow up of their knee. The patient is being followed for their right knee pain and osteoarthritis. They are now months out from Euflexxa series. Symptoms reported include: pain, aching, catching, instability and difficulty ambulating. The patient feels that they are doing poorly and report their pain level to be mild. The following medication has been used for pain control: Tylenol. The patient has reported improvement of their symptoms with: Cortisone injections and viscosupplementation. They helped some but for only a short amount of time. She is now at a stage where it bothers her bad enough and she wants to proceed with surgery at this time. They have been treated conservatively in the past for the above stated problem and despite conservative measures, they continue to have progressive pain and severe functional limitations and dysfunction. They have failed non-operative management including home exercise, medications, and injections. It is felt that they would benefit from undergoing total joint replacement. Risks and benefits of the procedure have been discussed with the patient and they elect to proceed with surgery. There are no active contraindications to surgery such as ongoing infection or rapidly progressive neurological disease.   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 Flagyl [08/22/2002]: GLUCOSAMINE [03/31/2007]: Oxycodone [08/22/2002]: PENICILLIN [08/22/2002]: TraZODone HCl *ANTIDEPRESSANTS*  Vicodin *ANALGESICS - OPIOID*  METRONIDAZOLE [08/22/2002]: Guaifenex-Rx DM *COUGH/COLD/ALLERGY*  Pentazocine-Naloxone HCl *ANALGESICS - OPIOID*  SMZ-TMP *ANTI-INFECTIVE AGENTS - MISC.*  TraMADol HCl *ANALGESICS - OPIOID*   Family History Osteoarthritis  mother Osteoporosis  mother and grandmother mothers side Cancer  mother and father Cerebrovascular Accident  grandfather mothers side Heart Disease  grandfather mothers side Hypertension  First Degree Relatives. mother and brother  Social History Tobacco use  Never smoker. 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 Gabapentin (100MG  Capsule, Oral) Active. (TID) Zantac (150MG  Tablet, Oral) Active. Aspirin (81MG  Tablet DR, Oral) Active. Benzoyl Peroxide (10% Gel, External as  needed) Active. Calcium Citrate + D (250-200MG -UNIT Tablet, Oral) Active. DULoxetine HCl (60MG  Capsule DR Part, Oral) Active. Fiber (600MG  Tablet, Oral) Active. Finacea (15% Gel, External) Active. Livalo (1MG  Tablet, Oral) Active. Montelukast Sodium (10MG  Tablet, Oral) Active. Multiple Vitamin (1 (one) Oral) Active. Nasal Mist (0.9% Aerosol Soln, Inhalation as needed) Active. Omeprazole (40MG  Capsule DR, Oral) Active. Probiotica (Oral) Active. Triamcinolone Acetonide (Top) (0.1% Ointment, External as needed) Active. Vitamin C (100MG  Tablet Chewable, Oral) Active. Vitamin D (1000UNIT Tablet, Oral) Active. Voltaren (1% Gel, Transdermal) Active.   Past Surgical History Foot Surgery  left Cataract Surgery  Date: 01/2012. right Straighten Nasal Septum  Date: 101. Tonsillectomy  Date: 73. Arthroscopy of Knee  Date: 05/1993. right Hysterectomy  Date: 05/1996. complete (non-cancerous) Carpal Tunnel Repair  bilateral; Right - 4/04, Left - 5/04 Trigger Thumb Surgery  Date: 01/2003. Arthroscopic Knee Surgery - Right  Date: 01/2006. Left Leg Varicose Vein Surgery  Date: 06/2006. Right Leg Vein Surgery  Date: 10/2007. Mallet Finger Right Middle  Date: 01/2019. Right Knee Surgery  Date: 08/2010. Nissen Fundoplication  Date: [id number]. Right Eye Laser Surgery  Date: 01/2011. Vitrectomy Eye Surgery  Date: 04/2011. Thumb [facility] Surgery  Date: 07/2013. Right Eye Surgery Scar Removal  Date: 01/2015. Lumbar Lam Surgery L3 and L4  Date: 02/2015. Left Eye Cataract Surgery  Date: 04/2016.   Review of Systems  General Present- Fatigue. Not Present- Chills, Fever, Memory Loss, Night Sweats, Weight Gain and Weight Loss. Skin Not Present- Eczema, Hives, Itching, Lesions and Rash. HEENT  Present- Headache. Not Present- Dentures, Double Vision, Hearing Loss, Tinnitus and Visual Loss. Respiratory Not Present- Allergies, Chronic Cough, Coughing up blood, Shortness of breath at rest and Shortness of breath with exertion. Cardiovascular Not Present- Chest Pain, Difficulty Breathing Lying Down, Murmur, Palpitations, Racing/skipping heartbeats and Swelling. Gastrointestinal Present- Difficulty Swallowing (has esophageal strictures) and Heartburn. Not Present- Abdominal Pain, Bloody Stool, Constipation, Diarrhea, Jaundice, Loss of appetitie, Nausea and Vomiting. Female Genitourinary Present- Urinating at Night. Not Present- Blood in Urine, Discharge, Flank Pain, Incontinence, Painful Urination, Urgency, Urinary frequency, Urinary Retention and Weak urinary stream. Musculoskeletal Present- Morning Stiffness and Muscle Pain. Not Present- Back Pain, Joint Pain, Joint Swelling, Muscle Weakness and Spasms. Neurological Not Present- Blackout spells, Difficulty with balance, Dizziness, Paralysis, Tremor and Weakness. Psychiatric Present- Insomnia.  Vitals  Weight: 165 lb Height: 64in Weight was reported by patient. Height was reported by patient. Body Surface Area: 1.8 m Body Mass Index: 28.32 kg/m  Pulse: 68 (Regular)  BP: 128/72 (Sitting, Left Arm, Standard)   Physical Exam General Mental Status -Alert, cooperative and good historian. General Appearance-pleasant, Not in acute distress. Orientation-Oriented X3. Build & Nutrition-Well nourished and Well developed.  Head and Neck Head-normocephalic, atraumatic . Neck Global Assessment - supple, no bruit auscultated on the right, no bruit auscultated on the left.  Eye Vision-Wears corrective lenses. Pupil - Bilateral-Regular and Round. Motion - Bilateral-EOMI.  Chest and Lung Exam Auscultation Breath sounds - clear at anterior chest wall and clear at posterior chest wall. Adventitious sounds - No  Adventitious sounds.  Cardiovascular Auscultation Rhythm - Regular rate and rhythm. Heart Sounds - S1 WNL and S2 WNL. Murmurs & Other Heart Sounds - Auscultation of the heart reveals - No Murmurs.  Abdomen Palpation/Percussion Tenderness - Abdomen is non-tender to palpation. Rigidity (guarding) - Abdomen is soft. Auscultation Auscultation of the abdomen reveals - Bowel sounds normal.  Female Genitourinary Note: Not done, not pertinent to present illness   Musculoskeletal Note: On exam, she  is in no distress. Her right knee shows no effusion. There is moderate crepitus on range of motion in the knee. She has some tenderness, lateral greater than medial and no instability.  Assessment & Plan  Primary osteoarthritis of right knee (M17.11)  Note:Surgical Plans: Right Total Knee Replacement  Disposition: Home and then In-Home VER[facility] Therapy Postoperative  PCP: Debbrah Alar, FNP - Patient has been seen preoperatively and felt to be stable for surgery. "Step-down post-op due to OSA"  IV TXA  Anesthesia Issues: None  Patient was instructed on what medications to stop prior to surgery.  Signed electronically by Ok Edwards, III PA-C

## 2016-09-07 LAB — BASIC METABOLIC PANEL
Anion gap: 3 — ABNORMAL LOW (ref 5–15)
BUN: 16 mg/dL (ref 6–20)
CALCIUM: 8.9 mg/dL (ref 8.9–10.3)
CO2: 25 mmol/L (ref 22–32)
CREATININE: 0.6 mg/dL (ref 0.44–1.00)
Chloride: 110 mmol/L (ref 101–111)
GFR calc non Af Amer: >60 mL/min (ref >60)
GLUCOSE: 126 mg/dL — AB (ref 65–99)
Potassium: 4.6 mmol/L (ref 3.5–5.1)
Sodium: 138 mmol/L (ref 135–145)

## 2016-09-07 LAB — CBC
HEMATOCRIT: 35.2 % — AB (ref 36.0–46.0)
Hemoglobin: 11.9 g/dL — ABNORMAL LOW (ref 12.0–15.0)
MCH: 31.1 pg (ref 26.0–34.0)
MCHC: 33.8 g/dL (ref 30.0–36.0)
MCV: 91.9 fL (ref 78.0–100.0)
Platelets: 295 10*3/uL (ref 150–400)
RBC: 3.83 MIL/uL — ABNORMAL LOW (ref 3.87–5.11)
RDW: 13.7 % (ref 11.5–15.5)
WBC: 13.7 10*3/uL — ABNORMAL HIGH (ref 4.0–10.5)

## 2016-09-07 MED ORDER — METHOCARBAMOL 500 MG PO TABS: 500.0000 mg | ORAL_TABLET | Freq: Four times a day (QID) | ORAL | 0 refills | Status: DC | PRN

## 2016-09-07 MED ORDER — GABAPENTIN 300 MG PO CAPS
300.0000 mg | ORAL_CAPSULE | Freq: Three times a day (TID) | ORAL | Status: DC
  Administered 2016-09-07 – 2016-09-08 (×4): 300 mg via ORAL
  Filled 2016-09-07 (×4): qty 1

## 2016-09-07 MED ORDER — HYDROMORPHONE HCL 2 MG PO TABS: 2.0000 mg | ORAL_TABLET | ORAL | 0 refills | Status: DC | PRN

## 2016-09-07 MED ORDER — GABAPENTIN 300 MG PO CAPS: 300.0000 mg | ORAL_CAPSULE | Freq: Three times a day (TID) | ORAL | 0 refills | Status: DC

## 2016-09-07 MED ORDER — OMEPRAZOLE 20 MG PO CPDR
40.0000 mg | DELAYED_RELEASE_CAPSULE | Freq: Every day | ORAL | Status: DC
  Administered 2016-09-07 – 2016-09-08 (×2): 40 mg via ORAL
  Filled 2016-09-07 (×2): qty 2

## 2016-09-07 MED ORDER — RIVAROXABAN 10 MG PO TABS: 10.0000 mg | ORAL_TABLET | Freq: Every day | ORAL | 0 refills | Status: DC

## 2016-09-07 NOTE — Discharge Instructions (Addendum)
° °Dr. Frank Aluisio °Total Joint Specialist °[facility] °[address] °[place], [place] [address] °[phone] ° °TOTAL KNEE REPLACEMENT POSTOPERATIVE DIRECTIONS ° °Knee Rehabilitation, Guidelines Following Surgery  °Results after knee surgery are often greatly improved when you follow the exercise, range of motion and muscle strengthening exercises prescribed by your doctor. Safety measures are also important to protect the knee from further injury. Any time any of these exercises cause you to have increased pain or swelling in your knee joint, decrease the amount until you are comfortable again and slowly increase them. If you have problems or questions, call your caregiver or physical therapist for advice.  ° °HOME CARE INSTRUCTIONS  °Remove items at home which could result in a fall. This includes throw rugs or furniture in walking pathways.  °· ICE to the affected knee every three hours for 30 minutes at a time and then as needed for pain and swelling.  Continue to use ice on the knee for pain and swelling from surgery. You may notice swelling that will progress down to the foot and ankle.  This is normal after surgery.  Elevate the leg when you are not up walking on it.   °· Continue to use the breathing machine which will help keep your temperature down.  It is common for your temperature to cycle up and down following surgery, especially at night when you are not up moving around and exerting yourself.  The breathing machine keeps your lungs expanded and your temperature down. °· Do not place pillow under knee, focus on keeping the knee straight while resting ° °DIET °You may resume your previous home diet once your are discharged from the hospital. ° °DRESSING / WOUND CARE / SHOWERING °You may shower 3 days after surgery, but keep the wounds dry during showering.  You may use an occlusive plastic wrap (Press'n Seal for example), NO SOAKING/SUBMERGING IN THE BATHTUB.  If the  bandage gets wet, change with a clean dry gauze.  If the incision gets wet, pat the wound dry with a clean towel. °You may start showering once you are discharged home but do not submerge the incision under water. Just pat the incision dry and apply a dry gauze dressing on daily. °Change the surgical dressing daily and reapply a dry dressing each time. ° °ACTIVITY °Walk with your walker as instructed. °Use walker as long as suggested by your caregivers. °Avoid periods of inactivity such as sitting longer than an hour when not asleep. This helps prevent blood clots.  °You may resume a sexual relationship in one month or when given the OK by your doctor.  °You may return to work once you are cleared by your doctor.  °Do not drive a car for 6 weeks or until released by you surgeon.  °Do not drive while taking narcotics. ° °WEIGHT BEARING °Weight bearing as tolerated with assist device (walker, cane, etc) as directed, use it as long as suggested by your surgeon or therapist, typically at least 4-6 weeks. ° °POSTOPERATIVE CONSTIPATION PROTOCOL °Constipation - defined medically as fewer than three stools per week and severe constipation as less than one stool per week. ° °One of the most common issues patients have following surgery is constipation.  Even if you have a regular bowel pattern at home, your normal regimen is likely to be disrupted due to multiple reasons following surgery.  Combination of anesthesia, postoperative narcotics, change in appetite and fluid intake all can affect your bowels.    In order to avoid complications following surgery, here are some recommendations in order to help you during your recovery period. ° °Colace (docusate) - Pick up an over-the-counter form of Colace or another stool softener and take twice a day as long as you are requiring postoperative pain medications.  Take with a full glass of water daily.  If you experience loose stools or diarrhea, hold the colace until you stool forms  back up.  If your symptoms do not get better within 1 week or if they get worse, check with your doctor. ° °Dulcolax (bisacodyl) - Pick up over-the-counter and take as directed by the product packaging as needed to assist with the movement of your bowels.  Take with a full glass of water.  Use this product as needed if not relieved by Colace only.  ° °MiraLax (polyethylene glycol) - Pick up over-the-counter to have on hand.  MiraLax is a solution that will increase the amount of water in your bowels to assist with bowel movements.  Take as directed and can mix with a glass of water, juice, soda, coffee, or tea.  Take if you go more than two days without a movement. °Do not use MiraLax more than once per day. Call your doctor if you are still constipated or irregular after using this medication for 7 days in a row. ° °If you continue to have problems with postoperative constipation, please contact the office for further assistance and recommendations.  If you experience "the worst abdominal pain ever" or develop nausea or vomiting, please contact the office immediatly for further recommendations for treatment. ° °ITCHING ° If you experience itching with your medications, try taking only a single pain pill, or even half a pain pill at a time.  You can also use Benadryl over the counter for itching or also to help with sleep.  ° °TED HOSE STOCKINGS °Wear the elastic stockings on both legs for three weeks following surgery during the day but you may remove then at night for sleeping. ° °MEDICATIONS °See your medication summary on the “After Visit Summary” that the nursing staff will review with you prior to discharge.  You may have some home medications which will be placed on hold until you complete the course of blood thinner medication.  It is important for you to complete the blood thinner medication as prescribed by your surgeon.  Continue your approved medications as instructed at time of  discharge. ° °PRECAUTIONS °If you experience chest pain or shortness of breath - call 911 immediately for transfer to the hospital emergency department.  °If you develop a fever greater that 101 F, purulent drainage from wound, increased redness or drainage from wound, foul odor from the wound/dressing, or calf pain - CONTACT YOUR SURGEON.   °                                                °FOLLOW-UP APPOINTMENTS °Make sure you keep all of your appointments after your operation with your surgeon and caregivers. You should call the office at the above phone number and make an appointment for approximately two weeks after the date of your surgery or on the date instructed by your surgeon outlined in the "After Visit Summary". ° ° °RANGE OF MOTION AND STRENGTHENING EXERCISES  °Rehabilitation of the knee is important following a knee injury or   an operation. After just a few days of immobilization, the muscles of the thigh which control the knee become weakened and shrink (atrophy). Knee exercises are designed to build up the tone and strength of the thigh muscles and to improve knee motion. Often times heat used for twenty to thirty minutes before working out will loosen up your tissues and help with improving the range of motion but do not use heat for the first two weeks following surgery. These exercises can be done on a training (exercise) mat, on the floor, on a table or on a bed. Use what ever works the best and is most comfortable for you Knee exercises include:  °Leg Lifts - While your knee is still immobilized in a splint or cast, you can do straight leg raises. Lift the leg to 60 degrees, hold for 3 sec, and slowly lower the leg. Repeat 10-20 times 2-3 times daily. Perform this exercise against resistance later as your knee gets better.  °Quad and Hamstring Sets - Tighten up the muscle on the front of the thigh (Quad) and hold for 5-10 sec. Repeat this 10-20 times hourly. Hamstring sets are done by pushing the  foot backward against an object and holding for 5-10 sec. Repeat as with quad sets.  °· Leg Slides: Lying on your back, slowly slide your foot toward your buttocks, bending your knee up off the floor (only go as far as is comfortable). Then slowly slide your foot back down until your leg is flat on the floor again. °· Angel Wings: Lying on your back spread your legs to the side as far apart as you can without causing discomfort.  °A rehabilitation program following serious knee injuries can speed recovery and prevent re-injury in the future due to weakened muscles. Contact your doctor or a physical therapist for more information on knee rehabilitation.  ° °IF YOU ARE TRANSFERRED TO A SKILLED REHAB FACILITY °If the patient is transferred to a skilled rehab facility following release from the hospital, a list of the current medications will be sent to the facility for the patient to continue.  When discharged from the skilled rehab facility, please have the facility set up the patient's Home Health Physical Therapy prior to being released. Also, the skilled facility will be responsible for providing the patient with their medications at time of release from the facility to include their pain medication, the muscle relaxants, and their blood thinner medication. If the patient is still at the rehab facility at time of the two week follow up appointment, the skilled rehab facility will also need to assist the patient in arranging follow up appointment in our office and any transportation needs. ° °MAKE SURE YOU:  °Understand these instructions.  °Get help right away if you are not doing well or get worse.  ° ° °Pick up stool softner and laxative for home use following surgery while on pain medications. °Do not submerge incision under water. °Please use good hand washing techniques while changing dressing each day. °May shower starting three days after surgery. °Please use a clean towel to pat the incision dry following  showers. °Continue to use ice for pain and swelling after surgery. °Do not use any lotions or creams on the incision until instructed by your surgeon. ° °Take Xarelto for two and a half more weeks following discharge from the hospital, then discontinue Xarelto. °Once the patient has completed the Xarelto, they may resume the 81 mg Aspirin. ° ° ° °Information on   my medicine - XARELTO (Rivaroxaban)  This medication education was reviewed with me or my healthcare representative as part of my discharge preparation.  The pharmacist that spoke with me during my hospital stay was:  Biagio Borg, [facility]  Why was Xarelto prescribed for you? Xarelto was prescribed for you to reduce the risk of blood clots forming after orthopedic surgery. The medical term for these abnormal blood clots is venous thromboembolism (VTE).  What do you need to know about xarelto ? Take your Xarelto ONCE DAILY at the same time every day. You may take it either with or without food.  If you have difficulty swallowing the tablet whole, you may crush it and mix in applesauce just prior to taking your dose.  Take Xarelto exactly as prescribed by your doctor and DO NOT stop taking Xarelto without talking to the doctor who prescribed the medication.  Stopping without other VTE prevention medication to take the place of Xarelto may increase your risk of developing a clot.  After discharge, you should have regular check-up appointments with your healthcare provider that is prescribing your Xarelto.    What do you do if you miss a dose? If you miss a dose, take it as soon as you remember on the same day then continue your regularly scheduled once daily regimen the next day. Do not take two doses of Xarelto on the same day.   Important Safety Information A possible side effect of Xarelto is bleeding. You should call your healthcare provider right away if you experience any of the following: ? Bleeding from an  injury or your nose that does not stop. ? Unusual colored urine (red or dark brown) or unusual colored stools (red or black). ? Unusual bruising for unknown reasons. ? A serious fall or if you hit your head (even if there is no bleeding).  Some medicines may interact with Xarelto and might increase your risk of bleeding while on Xarelto. To help avoid this, consult your healthcare provider or pharmacist prior to using any new prescription or non-prescription medications, including herbals, vitamins, non-steroidal anti-inflammatory drugs (NSAIDs) and supplements.  This website has more information on Xarelto: [email or web address]

## 2016-09-07 NOTE — Progress Notes (Signed)
Physical Therapy Treatment Patient Details Name: Vanessa Oliver MRN: [id number] DOB: 06-29-1951 Today's Date: 09/07/2016    History of Present Illness Pt s/p R TKR and with hx of fibromyalgia and chronic back pain    PT Comments    POD # 1 pm session Assisted OOB to amb an increased distance. Assisted back to bed to perform some TKR TE's followed by ICE.  Pt progressing well and plans to D/C tomorrow.   Follow Up Recommendations  Home health PT     Equipment Recommendations  Rolling walker with 5" wheels    Recommendations for Other Services       Precautions / Restrictions Precautions Precautions: None Precaution Comments: instructed on KI use for amb Required Braces or Orthoses: Knee Immobilizer - Right Knee Immobilizer - Right: Discontinue once straight leg raise with < 10 degree lag Restrictions Weight Bearing Restrictions: No Other Position/Activity Restrictions: WBAT    Mobility  Bed Mobility Overal bed mobility: Needs Assistance       Supine to sit: Min assist  Sit to supine: Min assist      Transfers Overall transfer level: Needs assistance Equipment used: Rolling walker (2 wheeled) Transfers: Sit to/from Stand Sit to Stand: Min guard         General transfer comment: for safety.  Cues for UE/LE placement  Ambulation/Gait Ambulation/Gait assistance: Min assist Ambulation Distance (Feet): 62 Feet Assistive device: Rolling walker (2 wheeled) Gait Pattern/deviations: Step-to pattern;Decreased step length - right;Decreased step length - left;Shuffle;Trunk flexed Gait velocity: decr   General Gait Details: tolerated increased distance.  50% VC's on proper walker to self distance and safety with turn completion.     Stairs            Wheelchair Mobility    Modified Rankin (Stroke Patients Only)       Balance                                    Cognition Arousal/Alertness: Awake/alert Behavior During Therapy: WFL  for tasks assessed/performed Overall Cognitive Status: Within Functional Limits for tasks assessed                      Exercises   Total Knee Replacement TE's 10 reps B LE ankle pumps 10 reps knee presses 10 reps SAQ's 10 reps SLR's Followed by ICE     General Comments        Pertinent Vitals/Pain Pain Assessment: 0-10 Pain Score: 4  Pain Location: R knee Pain Descriptors / Indicators: Aching Pain Intervention(s): Limited activity within patient's tolerance;Monitored during session;Premedicated before session;Repositioned;Ice applied    Home Living Family/patient expects to be discharged to:: Private residence Living Arrangements: Spouse/significant other           Home Equipment: Environmental consultant - 2 wheels;Cane - single point      Prior Function Level of Independence: Independent          PT Goals (current goals can now be found in the care plan section) Acute Rehab PT Goals Patient Stated Goal: Regain IND Progress towards PT goals: Progressing toward goals    Frequency    7X/week      PT Plan Current plan remains appropriate    Co-evaluation             End of Session Equipment Utilized During Treatment: Gait belt;Right knee immobilizer Activity Tolerance: Patient limited by fatigue Patient  left: in chair;with call bell/phone within reach;with nursing/sitter in room Nurse Communication: Mobility status PT Visit Diagnosis: Difficulty in walking, not elsewhere classified (R26.2)     Time: [id number] PT Time Calculation (min) (ACUTE ONLY): 25 min  Charges:  $Gait Training: 8-22 mins $Therapeutic Activity: 8-22 mins                    G Codes:       Rica Koyanagi  PTA [facility]  Acute  Rehab Pager      [phone]

## 2016-09-07 NOTE — Evaluation (Signed)
Occupational Therapy Evaluation Patient Details Name: Vanessa Oliver MRN: [id number] DOB: 1950-08-05 Today's Date: 09/07/2016    History of Present Illness Pt s/p R TKR and with hx of fibromyalgia and chronic back pain   Clinical Impression   This 66 year old female was admitted for the above sx. All education was completed. No further OT is needed at this time    Follow Up Recommendations  No OT follow up;Supervision/Assistance - 24 hour    Equipment Recommendations  None recommended by OT    Recommendations for Other Services       Precautions / Restrictions Precautions Precautions: None Precaution Comments: instructed on KI use for amb Required Braces or Orthoses: Knee Immobilizer - Right Knee Immobilizer - Right: Discontinue once straight leg raise with < 10 degree lag Restrictions Weight Bearing Restrictions: No Other Position/Activity Restrictions: WBAT      Mobility Bed Mobility Overal bed mobility: Needs Assistance       Supine to sit: Min assist     General bed mobility comments: OOB prior to OT  Transfers Overall transfer level: Needs assistance Equipment used: Rolling walker (2 wheeled) Transfers: Sit to/from Stand Sit to Stand: Min guard         General transfer comment: for safety.  Cues for UE/LE placement    Balance                                            ADL Overall ADL's : Needs assistance/impaired Eating/Feeding: Independent   Grooming: Supervision/safety;Standing   Upper Body Bathing: Set up;Sitting   Lower Body Bathing: Minimal assistance;Sit to/from stand   Upper Body Dressing : Set up;Sitting   Lower Body Dressing: Minimal assistance;Sit to/from stand   Toilet Transfer: Min guard;Comfort height Materials engineer Details (indicate cue type and reason): used grab bar to simulate counter Toileting- Water quality scientist and Hygiene: Min guard;Sit to/from stand       Functional mobility  during ADLs: Min guard General ADL Comments: pt has a tub/shower.  Reviewed readiness vs tub bench. Pt will sponge bathe intially.  Her husband will assist with adls as needed     Vision         Perception     Praxis      Pertinent Vitals/Pain Pain Assessment: 0-10 Pain Score: 4  Pain Location: R knee Pain Descriptors / Indicators: Aching Pain Intervention(s): Limited activity within patient's tolerance;Monitored during session;Premedicated before session;Repositioned;Ice applied     Hand Dominance     Extremity/Trunk Assessment Upper Extremity Assessment Upper Extremity Assessment: Overall WFL for tasks assessed           Communication Communication Communication: No difficulties   Cognition Arousal/Alertness: Awake/alert Behavior During Therapy: WFL for tasks assessed/performed Overall Cognitive Status: Within Functional Limits for tasks assessed                     General Comments       Exercises       Shoulder Instructions      Home Living Family/patient expects to be discharged to:: Private residence Living Arrangements: Spouse/significant other                 Bathroom Shower/Tub: Teaching laboratory technician Toilet: Handicapped height     Home Equipment: Environmental consultant - 2 wheels;Cane - single point  Prior Functioning/Environment Level of Independence: Independent                 OT Problem List:        OT Treatment/Interventions:      OT Goals(Current goals can be found in the care plan section) Acute Rehab OT Goals Patient Stated Goal: Regain IND OT Goal Formulation: All assessment and education complete, DC therapy  OT Frequency:     Barriers to D/C:            Co-evaluation              End of Session    Activity Tolerance: Patient tolerated treatment well Patient left: in chair;with chair alarm set;with call bell/phone within reach  OT Visit Diagnosis: Pain Pain - Right/Left: Right Pain -  part of body: Knee                ADL either performed or assessed with clinical judgement  Time: 1047-1108 OT Time Calculation (min): 21 min Charges:  OT General Charges $OT Visit: 1 Procedure OT Evaluation $OT Eval Low Complexity: 1 Procedure G-Codes:     [place]enn[place], OTR/L [id number] 09/07/2016  Vanessa Oliver 09/07/2016, 1:11 PM

## 2016-09-07 NOTE — Progress Notes (Signed)
Physical Therapy Treatment Patient Details Name: Vanessa Oliver MRN: [id number] DOB: [birth year] Today's Date: 09/07/2016    History of Present Illness Pt s/p R TKR and with hx of fibromyalgia and chronic back pain    PT Comments    POD # 1 am session Applied KI and instructed on use and proper application.  Assisted OOB to amb a greater distance in hallway.  Performed some TKR TE's following HEP followed by ICE.    Follow Up Recommendations  Home health PT     Equipment Recommendations  Rolling walker with 5" wheels    Recommendations for Other Services       Precautions / Restrictions Precautions Precautions: None Precaution Comments: instructed on KI use for amb Required Braces or Orthoses: Knee Immobilizer - Right Knee Immobilizer - Right: Discontinue once straight leg raise with < 10 degree lag Restrictions Weight Bearing Restrictions: No Other Position/Activity Restrictions: WBAT    Mobility  Bed Mobility Overal bed mobility: Needs Assistance       Supine to sit: Min assist     General bed mobility comments: cues for sequence and use of L LE to self assist  Transfers Overall transfer level: Needs assistance Equipment used: Rolling walker (2 wheeled) Transfers: Sit to/from Stand Sit to Stand: Min assist         General transfer comment: 50% cues for LE management and use of UEs to self assist  Ambulation/Gait Ambulation/Gait assistance: Min assist Ambulation Distance (Feet): 46 Feet Assistive device: Rolling walker (2 wheeled) Gait Pattern/deviations: Step-to pattern;Decreased step length - right;Decreased step length - left;Shuffle;Trunk flexed Gait velocity: decr   General Gait Details: tolerated increased distance.  50% VC's on proper walker to self distance and safety with turn completion.     Stairs            Wheelchair Mobility    Modified Rankin (Stroke Patients Only)       Balance                                    Cognition Arousal/Alertness: Awake/alert Behavior During Therapy: WFL for tasks assessed/performed Overall Cognitive Status: Within Functional Limits for tasks assessed                      Exercises   Total Knee Replacement TE's 10 reps B LE ankle pumps 10 reps towel squeezes 10 reps knee presses 10 reps heel slides  10 reps SAQ's 10 reps SLR's 10 reps ABD Followed by ICE     General Comments        Pertinent Vitals/Pain Pain Assessment: 0-10 Pain Score: 5  Pain Location: R knee Pain Descriptors / Indicators: Operative site guarding;Discomfort;Grimacing Pain Intervention(s): Monitored during session;Repositioned;Ice applied    Home Living                      Prior Function            PT Goals (current goals can now be found in the care plan section) Progress towards PT goals: Progressing toward goals    Frequency    7X/week      PT Plan Current plan remains appropriate    Co-evaluation             End of Session Equipment Utilized During Treatment: Gait belt;Right knee immobilizer Activity Tolerance: Patient limited by fatigue Patient left: in chair;with  call bell/phone within reach;with nursing/sitter in room Nurse Communication: Mobility status PT Visit Diagnosis: Difficulty in walking, not elsewhere classified (R26.2)     Time: [id number] PT Time Calculation (min) (ACUTE ONLY): 31 min  Charges:  $Gait Training: 8-22 mins $Therapeutic Exercise: 8-22 mins                    G Codes:       Rica Koyanagi  PTA [facility]  Acute  Rehab Pager      [phone]

## 2016-09-07 NOTE — Progress Notes (Signed)
   Subjective: 1 Day Post-Op Procedure(s) (LRB): RIGHT TOTAL KNEE ARTHROPLASTY (Right) Patient reports pain as mild and moderate.   Patient seen in rounds for Dr. Wynelle Link. Patient is well, but has had some minor complaints of pain in the knee\, requiring pain medications We will resume therapy today. She walked 6 feet yesterday Plan is to go Home after hospital stay.  Objective: Vital signs in last 24 hours: Temp:  [97.3 F (36.3 C)-98.4 F (36.9 C)] 98.4 F (36.9 C) (02/27 0539) Pulse Rate:  [70-90] 77 (02/27 0539) Resp:  [13-21] 16 (02/27 0539) BP: (107-150)/(49-89) 115/55 (02/27 0539) SpO2:  [96 %-100 %] 98 % (02/27 0539)  Intake/Output from previous day:  Intake/Output Summary (Last 24 hours) at 09/07/16 0851 Last data filed at 09/07/16 0540  Gross per 24 hour  Intake          2978.75 ml  Output             3895 ml  Net          -916.25 ml    Intake/Output this shift: No intake/output data recorded.  Labs:  Recent Labs  09/07/16 0408  HGB 11.9*    Recent Labs  09/07/16 0408  WBC 13.7*  RBC 3.83*  HCT 35.2*  PLT 295    Recent Labs  09/07/16 0408  NA 138  K 4.6  CL 110  CO2 25  BUN 16  CREATININE 0.60  GLUCOSE 126*  CALCIUM 8.9   No results for input(s): LABPT, INR in the last 72 hours.  EXAM General - Patient is Alert, Appropriate and Oriented Extremity - Neurovascular intact Sensation intact distally Intact pulses distally Dorsiflexion/Plantar flexion intact Dressing - dressing C/D/I Motor Function - intact, moving foot and toes well on exam.  Hemovac pulled without difficulty.  Past Medical History:  Diagnosis Date  . Allergy    takes Singulair at night  . Anxiety   . Arthritis   . Chronic back pain    stenosis  . Depression    takes CYmbalta daily  . Esophageal stricture   . Family history of adverse reaction to anesthesia    oldest brother had trouble with anesthesia a long time ago but can't recall what  . Fibromyalgia     . Fibromyalgia   . GERD (gastroesophageal reflux disease)    takes Omeprazole daily  . Hemorrhoids   . History of bronchitis   . Hyperlipidemia   . IBS (irritable bowel syndrome)   . Joint pain   . Joint swelling   . Plantar fasciitis   . Rosacea   . Sleep apnea    cpap- settings at 10 breathing   . TMJ (temporomandibular joint disorder)   . Vasovagal episode back in the 80's  . Vasovagal syncope   . Weakness    right leg    Assessment/Plan: 1 Day Post-Op Procedure(s) (LRB): RIGHT TOTAL KNEE ARTHROPLASTY (Right) Principal Problem:   OA (osteoarthritis) of knee  Estimated body mass index is 28.72 kg/m as calculated from the following:   Height as of this encounter: 5' 3.75" (1.619 m).   Weight as of this encounter: 75.3 kg (166 lb). Plan for discharge tomorrow  NO HHPT - In-Home VERA Therapy  DVT Prophylaxis - Xarelto Weight-Bearing as tolerated to right leg D/C O2 and Pulse OX and try on Room Air  Arlee Muslim, PA-C Orthopaedic Surgery 09/07/2016, 8:51 AM

## 2016-09-07 NOTE — Care Management Note (Signed)
Case Management Note  Patient Details  Name: Vanessa Oliver MRN: [id number] Date of Birth: [birth year]  Subjective/Objective:                  R TKR Action/Plan: Discharge planning  Expected Discharge Date:  09/08/16               Expected Discharge Plan:  Home/Self Care  In-House Referral:     Discharge planning Services  CM Consult  Post Acute Care Choice:  NA Choice offered to:  Patient  DME Arranged:  N/A DME Agency:  NA  HH Arranged:  NA HH Agency:  NA  Status of Service:  Completed, signed off  If discussed at [place] of Stay Meetings, dates discussed:    Additional Comments: CM met with pt in room to confirm plan is for Virtual PT; pt confirms. Pt states she has all DME needed at home. NO other CM needs were communicated. Dellie Catholic, RN 09/07/2016, 2:48 PM

## 2016-09-07 NOTE — Discharge Summary (Signed)
Physician Discharge Summary   Patient ID: Vanessa Oliver MRN: [id number] DOB/AGE: Mar 10, 1951 66 y.o.  Admit date: 09/06/2016 Discharge date: 09/08/2016  Primary Diagnosis:  Osteoarthritis  Right knee(s) Admission Diagnoses:  Past Medical History:  Diagnosis Date  . Allergy    takes Singulair at night  . Anxiety   . Arthritis   . Chronic back pain    stenosis  . Depression    takes CYmbalta daily  . Esophageal stricture   . Family history of adverse reaction to anesthesia    oldest brother had trouble with anesthesia a long time ago but can't recall what  . Fibromyalgia   . Fibromyalgia   . GERD (gastroesophageal reflux disease)    takes Omeprazole daily  . Hemorrhoids   . History of bronchitis   . Hyperlipidemia   . IBS (irritable bowel syndrome)   . Joint pain   . Joint swelling   . Plantar fasciitis   . Rosacea   . Sleep apnea    cpap- settings at 10 breathing   . TMJ (temporomandibular joint disorder)   . Vasovagal episode back in the 80's  . Vasovagal syncope   . Weakness    right leg   Discharge Diagnoses:   Principal Problem:   OA (osteoarthritis) of knee  Estimated body mass index is 28.72 kg/m as calculated from the following:   Height as of this encounter: 5' 3.75" (1.619 m).   Weight as of this encounter: 75.3 kg (166 lb).  Procedure:  Procedure(s) (LRB): RIGHT TOTAL KNEE ARTHROPLASTY (Right)   Consults: None  HPI: Vanessa Oliver is a 66 y.o. year old female with end stage OA of her right knee with progressively worsening pain and dysfunction. She has constant pain, with activity and at rest and significant functional deficits with difficulties even with ADLs. She has had extensive non-op management including analgesics, injections of cortisone and viscosupplements, and home exercise program, but remains in significant pain with significant dysfunction.Radiographs show bone on bone arthritis medial and patellofemoral. She presents now for right  Total Knee Arthroplasty.    Laboratory Data: Admission on 09/06/2016  Component Date Value Ref Range Status  . WBC 09/07/2016 13.7* 4.0 - 10.5 K/uL Final  . RBC 09/07/2016 3.83* 3.87 - 5.11 MIL/uL Final  . Hemoglobin 09/07/2016 11.9* 12.0 - 15.0 g/dL Final  . HCT 09/07/2016 35.2* 36.0 - 46.0 % Final  . MCV 09/07/2016 91.9  78.0 - 100.0 fL Final  . MCH 09/07/2016 31.1  26.0 - 34.0 pg Final  . MCHC 09/07/2016 33.8  30.0 - 36.0 g/dL Final  . RDW 09/07/2016 13.7  11.5 - 15.5 % Final  . Platelets 09/07/2016 295  150 - 400 K/uL Final  . Sodium 09/07/2016 138  135 - 145 mmol/L Final  . Potassium 09/07/2016 4.6  3.5 - 5.1 mmol/L Final  . Chloride 09/07/2016 110  101 - 111 mmol/L Final  . CO2 09/07/2016 25  22 - 32 mmol/L Final  . Glucose, Bld 09/07/2016 126* 65 - 99 mg/dL Final  . BUN 09/07/2016 16  6 - 20 mg/dL Final  . Creatinine, Ser 09/07/2016 0.60  0.44 - 1.00 mg/dL Final  . Calcium 09/07/2016 8.9  8.9 - 10.3 mg/dL Final  . GFR calc non Af Amer 09/07/2016 >60  >60 mL/min Final  . GFR calc Af Amer 09/07/2016 >60  >60 mL/min Final   Comment: (NOTE) The eGFR has been calculated using the CKD EPI equation. This calculation has not been  validated in all clinical situations. eGFR's persistently <60 mL/min signify possible Chronic Kidney Disease.   Georgiann Hahn gap 09/07/2016 3* 5 - 15 Final  Hospital Outpatient Visit on 09/01/2016  Component Date Value Ref Range Status  . aPTT 09/01/2016 30  24 - 36 seconds Final  . WBC 09/01/2016 7.0  4.0 - 10.5 K/uL Final  . RBC 09/01/2016 4.34  3.87 - 5.11 MIL/uL Final  . Hemoglobin 09/01/2016 13.8  12.0 - 15.0 g/dL Final  . HCT 09/01/2016 39.6  36.0 - 46.0 % Final  . MCV 09/01/2016 91.2  78.0 - 100.0 fL Final  . MCH 09/01/2016 31.8  26.0 - 34.0 pg Final  . MCHC 09/01/2016 34.8  30.0 - 36.0 g/dL Final  . RDW 09/01/2016 13.5  11.5 - 15.5 % Final  . Platelets 09/01/2016 345  150 - 400 K/uL Final  . Sodium 09/01/2016 140  135 - 145 mmol/L Final  .  Potassium 09/01/2016 4.0  3.5 - 5.1 mmol/L Final  . Chloride 09/01/2016 108  101 - 111 mmol/L Final  . CO2 09/01/2016 26  22 - 32 mmol/L Final  . Glucose, Bld 09/01/2016 115* 65 - 99 mg/dL Final  . BUN 09/01/2016 22* 6 - 20 mg/dL Final  . Creatinine, Ser 09/01/2016 0.58  0.44 - 1.00 mg/dL Final  . Calcium 09/01/2016 9.5  8.9 - 10.3 mg/dL Final  . Total Protein 09/01/2016 7.2  6.5 - 8.1 g/dL Final  . Albumin 09/01/2016 4.3  3.5 - 5.0 g/dL Final  . AST 09/01/2016 17  15 - 41 U/L Final  . ALT 09/01/2016 15  14 - 54 U/L Final  . Alkaline Phosphatase 09/01/2016 70  38 - 126 U/L Final  . Total Bilirubin 09/01/2016 0.5  0.3 - 1.2 mg/dL Final  . GFR calc non Af Amer 09/01/2016 >60  >60 mL/min Final  . GFR calc Af Amer 09/01/2016 >60  >60 mL/min Final   Comment: (NOTE) The eGFR has been calculated using the CKD EPI equation. This calculation has not been validated in all clinical situations. eGFR's persistently <60 mL/min signify possible Chronic Kidney Disease.   . Anion gap 09/01/2016 6  5 - 15 Final  . Prothrombin Time 09/01/2016 13.0  11.4 - 15.2 seconds Final  . INR 09/01/2016 0.98   Final  . ABO/RH(D) 09/01/2016 O NEG   Final  . Antibody Screen 09/01/2016 NEG   Final  . Sample Expiration 09/01/2016 09/09/2016   Final  . Extend sample reason 09/01/2016 NO TRANSFUSIONS OR PREGNANCY IN THE PAST 3 MONTHS   Final  . MRSA, PCR 09/01/2016 NEGATIVE  NEGATIVE Final  . Staphylococcus aureus 09/01/2016 NEGATIVE  NEGATIVE Final   Comment:        The Xpert SA Assay (FDA approved for NASAL specimens in patients over 80 years of age), is one component of a comprehensive surveillance program.  Test performance has been validated by [facility] for patients greater than or equal to 34 year old. It is not intended to diagnose infection nor to guide or monitor treatment.   . ABO/RH(D) 09/01/2016 O NEG   Final  Office Visit on 08/16/2016  Component Date Value Ref Range Status  . Rapid Strep A  Screen 08/16/2016 Negative  Negative Final  . Rapid Influenza A Ag 08/16/2016 positive   Final  Office Visit on 08/09/2016  Component Date Value Ref Range Status  . Sodium 08/09/2016 140  135 - 145 mEq/L Final  . Potassium 08/09/2016 4.7  3.5 -  5.1 mEq/L Final  . Chloride 08/09/2016 104  96 - 112 mEq/L Final  . CO2 08/09/2016 29  19 - 32 mEq/L Final  . Glucose, Bld 08/09/2016 114* 70 - 99 mg/dL Final  . BUN 08/09/2016 21  6 - 23 mg/dL Final  . Creatinine, Ser 08/09/2016 0.77  0.40 - 1.20 mg/dL Final  . Calcium 08/09/2016 9.7  8.4 - 10.5 mg/dL Final  . GFR 08/09/2016 79.45  >60.00 mL/min Final  . Total Bilirubin 08/09/2016 0.5  0.2 - 1.2 mg/dL Final  . Bilirubin, Direct 08/09/2016 0.1  0.0 - 0.3 mg/dL Final  . Alkaline Phosphatase 08/09/2016 70  39 - 117 U/L Final  . AST 08/09/2016 14  0 - 37 U/L Final  . ALT 08/09/2016 13  0 - 35 U/L Final  . Total Protein 08/09/2016 7.1  6.0 - 8.3 g/dL Final  . Albumin 08/09/2016 4.4  3.5 - 5.2 g/dL Final  . WBC 08/09/2016 7.2  4.0 - 10.5 K/uL Final  . RBC 08/09/2016 4.75  3.87 - 5.11 Mil/uL Final  . Hemoglobin 08/09/2016 15.1* 12.0 - 15.0 g/dL Final  . HCT 08/09/2016 44.3  36.0 - 46.0 % Final  . MCV 08/09/2016 93.2  78.0 - 100.0 fl Final  . MCHC 08/09/2016 34.0  30.0 - 36.0 g/dL Final  . RDW 08/09/2016 13.5  11.5 - 15.5 % Final  . Platelets 08/09/2016 316.0  150.0 - 400.0 K/uL Final  . Neutrophils Relative % 08/09/2016 55.6  43.0 - 77.0 % Final  . Lymphocytes Relative 08/09/2016 32.7  12.0 - 46.0 % Final  . Monocytes Relative 08/09/2016 7.7  3.0 - 12.0 % Final  . Eosinophils Relative 08/09/2016 3.3  0.0 - 5.0 % Final  . Basophils Relative 08/09/2016 0.7  0.0 - 3.0 % Final  . Neutro Abs 08/09/2016 4.0  1.4 - 7.7 K/uL Final  . Lymphs Abs 08/09/2016 2.4  0.7 - 4.0 K/uL Final  . Monocytes Absolute 08/09/2016 0.6  0.1 - 1.0 K/uL Final  . Eosinophils Absolute 08/09/2016 0.2  0.0 - 0.7 K/uL Final  . Basophils Absolute 08/09/2016 0.1  0.0 - 0.1 K/uL  Final  . Color, Urine 08/09/2016 YELLOW  Yellow;Lt. Yellow Final  . APPearance 08/09/2016 CLEAR  Clear Final  . Specific Gravity, Urine 08/09/2016 1.025  1.000 - 1.030 Final  . pH 08/09/2016 6.0  5.0 - 8.0 Final  . Total Protein, Urine 08/09/2016 NEGATIVE  Negative Final  . Urine Glucose 08/09/2016 NEGATIVE  Negative Final  . Ketones, ur 08/09/2016 NEGATIVE  Negative Final  . Bilirubin Urine 08/09/2016 NEGATIVE  Negative Final  . Hgb urine dipstick 08/09/2016 NEGATIVE  Negative Final  . Urobilinogen, UA 08/09/2016 0.2  0.0 - 1.0 Final  . Leukocytes, UA 08/09/2016 NEGATIVE  Negative Final  . Nitrite 08/09/2016 NEGATIVE  Negative Final  . WBC, UA 08/09/2016 0-2/hpf  0-2/hpf Final  . RBC / HPF 08/09/2016 none seen  0-2/hpf Final  . Squamous Epithelial / LPF 08/09/2016 Rare(0-4/hpf)  Rare(0-4/hpf) Final  . Ca Oxalate Crys, UA 08/09/2016 Presence of* None Final  . Organism ID, Bacteria 08/09/2016 NO GROWTH   Final  . INR 08/09/2016 1.0  0.8 - 1.0 ratio Final  . Prothrombin Time 08/09/2016 10.7  9.6 - 13.1 sec Final     X-Rays:Dg Chest 2 View  Result Date: 08/09/2016 CLINICAL DATA:  Preoperative examination. EXAM: CHEST  2 VIEW COMPARISON:  None. FINDINGS: Lungs are clear. Heart size is normal. No pneumothorax or pleural effusion. No acute  bony abnormality. IMPRESSION: No acute disease. Electronically Signed   By: Inge Rise M.D.   On: 08/09/2016 16:25    EKG: Orders placed or performed in visit on 08/09/16  . EKG 12-Lead     Hospital Course: Vanessa Oliver is a 66 y.o. who was admitted to [facility]. They were brought to the operating room on 09/06/2016 and underwent Procedure(s): RIGHT TOTAL KNEE ARTHROPLASTY.  Patient tolerated the procedure well and was later transferred to the recovery room and then to the orthopaedic floor for postoperative care.  They were given PO and IV analgesics for pain control following their surgery.  They were given 24 hours of postoperative  antibiotics of  Anti-infectives    Start     Dose/Rate Route Frequency Ordered Stop   09/06/16 2000  vancomycin (VANCOCIN) IVPB 1000 mg/200 mL premix     1,000 mg 200 mL/hr over 60 Minutes Intravenous Every 12 hours 09/06/16 1102 09/06/16 2038   09/06/16 0601  vancomycin (VANCOCIN) IVPB 1000 mg/200 mL premix     1,000 mg 200 mL/hr over 60 Minutes Intravenous On call to O.R. 09/06/16 [id number] 09/06/16 0836     and started on DVT prophylaxis in the form of Xarelto.   PT and OT were ordered for total joint protocol.  Discharge planning consulted to help with postop disposition and equipment needs.  Patient had a decent night on the evening of surgery.  They started to get up OOB with therapy on day one. Hemovac drain was pulled without difficulty.   POD 2 - Continued to work with therapy into day two.  Dressing was changed on day two and the incision was healing well.  Patient was seen in rounds and was ready to go home later that day.  NO HHPT - In-Home VERA Therapy Diet: Cardiac diet Activity:WBAT Follow-up:in 2 weeks Disposition - Home Discharged Condition: good   Discharge Instructions    Call MD / Call 911    Complete by:  As directed    If you experience chest pain or shortness of breath, CALL 911 and be transported to the hospital emergency room.  If you develope a fever above 101 F, pus (white drainage) or increased drainage or redness at the wound, or calf pain, call your surgeon's office.   Change dressing    Complete by:  As directed    Change dressing daily with sterile 4 x 4 inch gauze dressing and apply TED hose. Do not submerge the incision under water.   Constipation Prevention    Complete by:  As directed    Drink plenty of fluids.  Prune juice may be helpful.  You may use a stool softener, such as Colace (over the counter) 100 mg twice a day.  Use MiraLax (over the counter) for constipation as needed.   Diet - low sodium heart healthy    Complete by:  As directed     Discharge instructions    Complete by:  As directed    Pick up stool softner and laxative for home use following surgery while on pain medications. Do not submerge incision under water. Please use good hand washing techniques while changing dressing each day. May shower starting three days after surgery. Please use a clean towel to pat the incision dry following showers. Continue to use ice for pain and swelling after surgery. Do not use any lotions or creams on the incision until instructed by your surgeon.  Wear both TED hose on both  legs during the day every day for three weeks, but may have off at night at home.  Postoperative Constipation Protocol  Constipation - defined medically as fewer than three stools per week and severe constipation as less than one stool per week.  One of the most common issues patients have following surgery is constipation.  Even if you have a regular bowel pattern at home, your normal regimen is likely to be disrupted due to multiple reasons following surgery.  Combination of anesthesia, postoperative narcotics, change in appetite and fluid intake all can affect your bowels.  In order to avoid complications following surgery, here are some recommendations in order to help you during your recovery period.  Colace (docusate) - Pick up an over-the-counter form of Colace or another stool softener and take twice a day as long as you are requiring postoperative pain medications.  Take with a full glass of water daily.  If you experience loose stools or diarrhea, hold the colace until you stool forms back up.  If your symptoms do not get better within 1 week or if they get worse, check with your doctor.  Dulcolax (bisacodyl) - Pick up over-the-counter and take as directed by the product packaging as needed to assist with the movement of your bowels.  Take with a full glass of water.  Use this product as needed if not relieved by Colace only.   MiraLax (polyethylene  glycol) - Pick up over-the-counter to have on hand.  MiraLax is a solution that will increase the amount of water in your bowels to assist with bowel movements.  Take as directed and can mix with a glass of water, juice, soda, coffee, or tea.  Take if you go more than two days without a movement. Do not use MiraLax more than once per day. Call your doctor if you are still constipated or irregular after using this medication for 7 days in a row.  If you continue to have problems with postoperative constipation, please contact the office for further assistance and recommendations.  If you experience "the worst abdominal pain ever" or develop nausea or vomiting, please contact the office immediatly for further recommendations for treatment.   Take Xarelto for two and a half more weeks, then discontinue Xarelto. Once the patient has completed the Xarelto, they may resume the 81 mg Aspirin.   Do not put a pillow under the knee. Place it under the heel.    Complete by:  As directed    Do not sit on low chairs, stoools or toilet seats, as it may be difficult to get up from low surfaces    Complete by:  As directed    Driving restrictions    Complete by:  As directed    No driving until released by the physician.   Increase activity slowly as tolerated    Complete by:  As directed    Lifting restrictions    Complete by:  As directed    No lifting until released by the physician.   Patient may shower    Complete by:  As directed    You may shower without a dressing once there is no drainage.  Do not wash over the wound.  If drainage remains, do not shower until drainage stops.   TED hose    Complete by:  As directed    Use stockings (TED hose) for 3 weeks on both leg(s).  You may remove them at night for sleeping.   Weight bearing  as tolerated    Complete by:  As directed    Laterality:  right   Extremity:  Lower     Allergies as of 09/07/2016      Reactions   Aleve [naproxen Sodium]     Swelling, itching   Cefuroxime Axetil    Hives / "throat swelling"   Chlorzoxazone Rash, Other (See Comments)   "breathing problems"    Oxycodone    Hallucinations, rash   Penicillins Anaphylaxis, Hives   Has patient had a PCN reaction causing immediate rash, facial/tongue/throat swelling, SOB or lightheadedness with hypotension: Yes Has patient had a PCN reaction causing severe rash involving mucus membranes or skin necrosis: No Has patient had a PCN reaction that required hospitalization No Has patient had a PCN reaction occurring within the last 10 years: No If all of the above answers are "NO", then may proceed with Cephalosporin use.   Adhesive [tape]    rash   Cataflam [diclofenac Potassium]    Lip swelling   Clarithromycin Diarrhea   Flagyl [metronidazole Hcl]    ?diarrhea   Glucosamine Hives   Guaifenesin & Derivatives    Stomach upset   Hydrocodone-acetaminophen    REACTION: rash, tightening of throat   Ketorolac Tromethamine    ?reaction   Pentazocine    Involuntary twitching   Pneumovax [pneumococcal Polysaccharide Vaccine] Other (See Comments)   Redness/swelling at site, nausea   Prevnar [pneumococcal 13-val Conj Vacc] Other (See Comments)   Swelling, redness, nausea   Smz-tmp Ds [sulfamethoxazole W/trimethoprim (co-trimoxazole)]    ?unknown reaction   Tramadol    constipation   Trazodone And Nefazodone Other (See Comments)   Scratchy throat / congestion   Latex Rash      Medication List    STOP taking these medications   aspirin 81 MG tablet   Calcium Carbonate-Vitamin D 600-400 MG-UNIT tablet Commonly known as:  CALTRATE 600+D   cholecalciferol 1000 units tablet Commonly known as:  VITAMIN D   diclofenac sodium 1 % Gel Commonly known as:  VOLTAREN   ibuprofen 200 MG tablet Commonly known as:  ADVIL,MOTRIN   multivitamin tablet   PROBIOTIC DAILY PO   vitamin C 500 MG tablet Commonly known as:  ASCORBIC ACID     TAKE these medications     acetaminophen 500 MG tablet Commonly known as:  TYLENOL Take 1,000 mg by mouth every 6 (six) hours as needed for moderate pain.   benzonatate 100 MG capsule Commonly known as:  TESSALON Take 1 capsule (100 mg total) by mouth 3 (three) times daily as needed. What changed:  reasons to take this   benzoyl peroxide 5 % gel Apply 1 application topically 2 (two) times daily.   BLINK TEARS 0.25 % Gel Generic drug:  Polyethylene Glycol 210 Apply 1 application to eye daily as needed (dry eyes).   DULoxetine 60 MG capsule Commonly known as:  CYMBALTA TAKE 1 CAPSULE DAILY What changed:  See the new instructions.   EPINEPHrine 0.3 mg/0.3 mL Soaj injection Commonly known as:  EPIPEN 2-PAK Inject 0.3 mLs (0.3 mg total) into the muscle once.   FIBER PO Take 2 capsules by mouth 2 (two) times daily.   FINACEA 15 % cream Generic drug:  Azelaic Acid Apply 1 application topically 2 (two) times daily. After skin is thoroughly washed and patted dry, gently but thoroughly massage a thin film of azelaic acid cream into the affected area twice daily, in the morning and evening.   gabapentin 300  MG capsule Commonly known as:  NEURONTIN Take 1 capsule (300 mg total) by mouth 3 (three) times daily. Gabapentin 300 mg Protocol Take a 300 mg capsule three times a day for one week, Then a 300 mg capsule twice a day for one week, Then a 300 mg capsule once a day for one week, then discontinue the Gabapentin. Start taking on:  09/08/2016   HYDROmorphone 2 MG tablet Commonly known as:  DILAUDID Take 1-2 tablets (2-4 mg total) by mouth every 4 (four) hours as needed for severe pain.   loratadine 10 MG tablet Commonly known as:  CLARITIN Take 10 mg by mouth daily as needed for allergies.   methocarbamol 500 MG tablet Commonly known as:  ROBAXIN Take 1 tablet (500 mg total) by mouth every 6 (six) hours as needed for muscle spasms.   omeprazole 40 MG capsule Commonly known as:  PRILOSEC Take 1 capsule  (40 mg total) by mouth daily. What changed:  when to take this   pravastatin 20 MG tablet Commonly known as:  PRAVACHOL Take 1 tablet (20 mg total) by mouth at bedtime. What changed:  how much to take   rivaroxaban 10 MG Tabs tablet Commonly known as:  XARELTO Take 1 tablet (10 mg total) by mouth daily with breakfast. Take Xarelto for two and a half more weeks following discharge from the hospital, then discontinue Xarelto. Once the patient has completed the Xarelto, they may resume the 81 mg Aspirin. Start taking on:  09/08/2016      Follow-up Information    Gearlean Alf, MD. Schedule an appointment as soon as possible for a visit on 09/21/2016.   Specialty:  Orthopedic Surgery Contact information: [address] [place] [address] [phone]           Signed: Arlee Muslim, PA-C Orthopaedic Surgery 09/07/2016, 10:32 PM

## 2016-09-08 LAB — CBC
HEMATOCRIT: 32.7 % — AB (ref 36.0–46.0)
Hemoglobin: 11 g/dL — ABNORMAL LOW (ref 12.0–15.0)
MCH: 31.2 pg (ref 26.0–34.0)
MCHC: 33.6 g/dL (ref 30.0–36.0)
MCV: 92.6 fL (ref 78.0–100.0)
PLATELETS: 293 10*3/uL (ref 150–400)
RBC: 3.53 MIL/uL — ABNORMAL LOW (ref 3.87–5.11)
RDW: 14 % (ref 11.5–15.5)
WBC: 14.1 10*3/uL — AB (ref 4.0–10.5)

## 2016-09-08 LAB — BASIC METABOLIC PANEL
ANION GAP: 6 (ref 5–15)
BUN: 15 mg/dL (ref 6–20)
CALCIUM: 8.7 mg/dL — AB (ref 8.9–10.3)
CO2: 27 mmol/L (ref 22–32)
Chloride: 107 mmol/L (ref 101–111)
Creatinine, Ser: 0.62 mg/dL (ref 0.44–1.00)
Glucose, Bld: 111 mg/dL — ABNORMAL HIGH (ref 65–99)
Potassium: 3.8 mmol/L (ref 3.5–5.1)
Sodium: 140 mmol/L (ref 135–145)

## 2016-09-08 NOTE — Progress Notes (Signed)
Physical Therapy Treatment Patient Details Name: Vanessa Oliver MRN: [id number] DOB: [birth year] Today's Date: 09/08/2016    History of Present Illness Pt s/p R TKR and with hx of fibromyalgia and chronic back pain    PT Comments    POD # 2  Assisted OOB to amb a greater distance in hallway, practiced getting up onto a high bed, performed TKR Te's.   Follow Up Recommendations   (Virtual PT)     Equipment Recommendations       Recommendations for Other Services       Precautions / Restrictions Precautions Precautions: None Precaution Comments: instructed on KI use for amb Required Braces or Orthoses: Knee Immobilizer - Right Knee Immobilizer - Right: Discontinue once straight leg raise with < 10 degree lag Restrictions Weight Bearing Restrictions: No Other Position/Activity Restrictions: WBAT    Mobility  Bed Mobility Overal bed mobility: Needs Assistance Bed Mobility: Supine to Sit     Supine to sit: Min assist     General bed mobility comments: assist R LE  Transfers Overall transfer level: Needs assistance Equipment used: Rolling walker (2 wheeled) Transfers: Sit to/from Stand Sit to Stand: Min guard         General transfer comment: for safety.  Cues for UE/LE placement  Ambulation/Gait Ambulation/Gait assistance: Min guard;Supervision Ambulation Distance (Feet): 58 Feet Assistive device: Rolling walker (2 wheeled) Gait Pattern/deviations: Step-to pattern;Decreased step length - right;Decreased step length - left;Shuffle;Trunk flexed Gait velocity: decr   General Gait Details: tolerated increased distance.  25 VC's on proper walker to self distance and safety with turn completion.     Stairs            Wheelchair Mobility    Modified Rankin (Stroke Patients Only)       Balance                                    Cognition Arousal/Alertness: Awake/alert Behavior During Therapy: WFL for tasks  assessed/performed Overall Cognitive Status: Within Functional Limits for tasks assessed                      Exercises   Total Knee Replacement TE's 10 reps B LE ankle pumps 10 reps towel squeezes 10 reps knee presses 10 reps heel slides  10 reps SAQ's 10 reps SLR's 10 reps ABD Followed by ICE     General Comments        Pertinent Vitals/Pain Pain Assessment: 0-10 Pain Score: 5  Pain Location: R knee Pain Descriptors / Indicators: Aching Pain Intervention(s): Monitored during session;Repositioned;Ice applied    Home Living                      Prior Function            PT Goals (current goals can now be found in the care plan section) Progress towards PT goals: Progressing toward goals    Frequency    7X/week      PT Plan Current plan remains appropriate    Co-evaluation             End of Session Equipment Utilized During Treatment: Gait belt;Right knee immobilizer Activity Tolerance: Patient tolerated treatment well Patient left: in chair;with call bell/phone within reach Nurse Communication: Mobility status PT Visit Diagnosis: Difficulty in walking, not elsewhere classified (R26.2)     Time: [id number] PT Time  Calculation (min) (ACUTE ONLY): 28 min  Charges:  $Gait Training: 8-22 mins $Therapeutic Exercise: 8-22 mins                    G Codes:       Rica Koyanagi  PTA [facility]  Acute  Rehab Pager      [phone]

## 2016-09-10 DIAGNOSIS — M25661 Stiffness of right knee, not elsewhere classified: Secondary | ICD-10-CM | POA: Diagnosis not present

## 2016-09-21 DIAGNOSIS — Z471 Aftercare following joint replacement surgery: Secondary | ICD-10-CM | POA: Diagnosis not present

## 2016-09-21 DIAGNOSIS — Z96651 Presence of right artificial knee joint: Secondary | ICD-10-CM | POA: Diagnosis not present

## 2016-10-01 ENCOUNTER — Encounter: Payer: Self-pay | Admitting: Family

## 2016-10-12 DIAGNOSIS — Z96651 Presence of right artificial knee joint: Secondary | ICD-10-CM | POA: Diagnosis not present

## 2016-10-12 DIAGNOSIS — Z471 Aftercare following joint replacement surgery: Secondary | ICD-10-CM | POA: Diagnosis not present

## 2016-11-15 ENCOUNTER — Ambulatory Visit: Payer: Medicare Other | Admitting: Family

## 2016-11-16 DIAGNOSIS — Z96651 Presence of right artificial knee joint: Secondary | ICD-10-CM | POA: Diagnosis not present

## 2016-11-16 DIAGNOSIS — Z471 Aftercare following joint replacement surgery: Secondary | ICD-10-CM | POA: Diagnosis not present

## 2016-12-10 ENCOUNTER — Ambulatory Visit (INDEPENDENT_AMBULATORY_CARE_PROVIDER_SITE_OTHER): Payer: Medicare Other | Admitting: Family

## 2016-12-10 ENCOUNTER — Encounter: Payer: Self-pay | Admitting: Family

## 2016-12-10 VITALS — BP 127/66 | HR 86 | Temp 98.3°F | Resp 16 | Ht 64.0 in | Wt 160.4 lb

## 2016-12-10 DIAGNOSIS — K219 Gastro-esophageal reflux disease without esophagitis: Secondary | ICD-10-CM | POA: Diagnosis not present

## 2016-12-10 DIAGNOSIS — R1310 Dysphagia, unspecified: Secondary | ICD-10-CM | POA: Diagnosis not present

## 2016-12-10 DIAGNOSIS — E785 Hyperlipidemia, unspecified: Secondary | ICD-10-CM

## 2016-12-10 DIAGNOSIS — F418 Other specified anxiety disorders: Secondary | ICD-10-CM | POA: Diagnosis not present

## 2016-12-10 LAB — LIPID PANEL
CHOL/HDL RATIO: 4.9 ratio (ref <5.0)
Cholesterol: 180 mg/dL (ref <200)
HDL: 37 mg/dL — ABNORMAL LOW (ref >50)
LDL CALC: 110 mg/dL — AB (ref <100)
Triglycerides: 165 mg/dL — ABNORMAL HIGH (ref <150)
VLDL: 33 mg/dL — AB (ref <30)

## 2016-12-10 MED ORDER — OMEPRAZOLE 40 MG PO CPDR: 40.0000 mg | DELAYED_RELEASE_CAPSULE | Freq: Every day | ORAL | 1 refills | Status: DC

## 2016-12-10 MED ORDER — DULOXETINE HCL 60 MG PO CPEP
60.0000 mg | ORAL_CAPSULE | Freq: Every day | ORAL | 1 refills | Status: DC
Start: 2016-12-10 — End: 2017-09-11

## 2016-12-10 NOTE — Patient Instructions (Signed)
Please schedule follow up with Dr. Rosalyn Charters for your swallowing issue. Please complete lab work prior to leaving.

## 2016-12-10 NOTE — Progress Notes (Signed)
Subjective:    Patient ID: Vanessa Oliver, female    DOB: 26-Oct-1950, 67 y.o.   MRN: [id number]  HPI  Vanessa Oliver is a 66 yr old female who presents today for follow up.  Depression/anxiety- maintained on cymbalta 60mg . Reports mood is good. Does get frustrated at times with her elderly mother.   Hyperlipidemia- maintained on pravastatin. She is taking 20mg  of pravastatin daily. Denies myalgia.  Lab Results  Component Value Date   CHOL 172 06/09/2015   HDL 36.50 (L) 06/09/2015   LDLCALC 108 (H) 06/09/2015   TRIG 136.0 06/09/2015   CHOLHDL 5 06/09/2015   Dysphagia- had episode of fiber pill getting stuck with swallowing. Has seen Dr. Rolm Bookbinder in the past for stricture.   GERD- she is maintained on omeprazole.  Reports symptomatic if she misses doses.    Review of Systems See HPI  Past Medical History:  Diagnosis Date  . Allergy    takes Singulair at night  . Anxiety   . Arthritis   . Chronic back pain    stenosis  . Depression    takes CYmbalta daily  . Esophageal stricture   . Family history of adverse reaction to anesthesia    oldest brother had trouble with anesthesia a long time ago but can't recall what  . Fibromyalgia   . Fibromyalgia   . GERD (gastroesophageal reflux disease)    takes Omeprazole daily  . Hemorrhoids   . History of bronchitis   . Hyperlipidemia   . IBS (irritable bowel syndrome)   . Joint pain   . Joint swelling   . Plantar fasciitis   . Rosacea   . Sleep apnea    cpap- settings at 10 breathing   . TMJ (temporomandibular joint disorder)   . Vasovagal episode back in the 80's  . Vasovagal syncope   . Weakness    right leg     Social History   Social History  . Marital status: Married    Spouse name: N/A  . Number of children: 0  . Years of education: N/A   Occupational History  . 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 Smoking status: Never Smoker  . Smokeless tobacco: Never Used  . Alcohol use 0.0  oz/week     Comment: rarely  . Drug use: No  . Sexual activity: Yes    Birth control/ protection: Surgical   Other Topics Concern  . Not on file   Social History Narrative   Works as an Glass blower/designer   Married- husband is 82 years older than her   Former Dance movement psychotherapist up up [employer]   Has cats   Enjoys reading   No children    Past Surgical History:  Procedure Laterality Date  . ABDOMINAL HYSTERECTOMY  1997  . CARPAL TUNNEL RELEASE Bilateral 2004  . CARPOMETACARPAL (CMC) FUSION OF THUMB  2015   thumb  . CATARACT EXTRACTION Right 2013   Dr Ellison Hughs  . COLONOSCOPY    . COLONOSCOPY WITH ESOPHAGOGASTRODUODENOSCOPY (EGD) AND ESOPHAGEAL DILATION (ED)    . EYE SURGERY     scar tissue remove  . EYE SURGERY  04/2016  . 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 KNEE ARTHROSCOPY Right 1994   x 3  . LUMBAR LAMINECTOMY/DECOMPRESSION MICRODISCECTOMY Right 02/11/2015   Procedure: Laminectomy and Foraminotomy - Right - L3-L4;  Surgeon: Earnie Larsson, MD;  Location: [facility];  Service: Neurosurgery;  Laterality: Right;  Laminectomy and Foraminotomy - Right - L3-L4  . mallet finger Right 2011  . NASAL SEPTUM SURGERY  1970  . NISSEN FUNDOPLICATION  6812  . NISSEN FUNDOPLICATION    . RETINAL LASER PROCEDURE Right 02/01/2011   right eye   . TONSILLECTOMY  1981  . TOTAL KNEE ARTHROPLASTY Right 09/06/2016   Procedure: RIGHT TOTAL KNEE ARTHROPLASTY;  Surgeon: Gaynelle Arabian, MD;  Location: [facility];  Service: Orthopedics;  Laterality: Right;  . TRIGGER FINGER RELEASE Bilateral 2005  . VARICOSE VEIN SURGERY Bilateral 2007, 2009   left 2007, right 2009  . VITRECTOMY Right 2012    Family History  Problem Relation Age of Onset  . Breast cancer Mother   . Arthritis Mother   . Hyperlipidemia Mother   . Hypertension Mother   . Lung cancer Father   . Pancreatic cancer Father   . Skin cancer Father   . Arthritis Father   . Hyperlipidemia Maternal Grandfather   . Heart disease Maternal  Grandfather   . Stroke Maternal Grandfather   . Celiac disease Brother     Allergies  Allergen Reactions  . Aleve [Naproxen Sodium]     Swelling, itching  . Cefuroxime Axetil     Hives / "throat swelling"  . Chlorzoxazone Rash and Other (See Comments)    "breathing problems"   . Oxycodone     Hallucinations, rash  . Penicillins Anaphylaxis and Hives    Has patient had a PCN reaction causing immediate rash, facial/tongue/throat swelling, SOB or lightheadedness with hypotension: Yes Has patient had a PCN reaction causing severe rash involving mucus membranes or skin necrosis: No Has patient had a PCN reaction that required hospitalization No Has patient had a PCN reaction occurring within the last 10 years: No If all of the above answers are "NO", then may proceed with Cephalosporin use.   . Adhesive [Tape]     rash  . Cataflam [Diclofenac Potassium]     Lip swelling  . Clarithromycin Diarrhea  . Flagyl [Metronidazole Hcl]     ?diarrhea  . Glucosamine Hives  . Guaifenesin & Derivatives     Stomach upset  . Hydrocodone-Acetaminophen     REACTION: rash, tightening of throat  . Ketorolac Tromethamine     ?reaction  . Pentazocine     Involuntary twitching  . Pneumovax [Pneumococcal Polysaccharide Vaccine] Other (See Comments)    Redness/swelling at site, nausea  . Prevnar [Pneumococcal 13-Val Conj Vacc] Other (See Comments)    Swelling, redness, nausea  . Smz-Tmp Ds [Sulfamethoxazole W/Trimethoprim (Co-Trimoxazole)]     ?unknown reaction  . Tramadol     constipation  . Trazodone And Nefazodone Other (See Comments)    Scratchy throat / congestion  . Latex Rash    Current Outpatient Prescriptions on File Prior to Visit  Medication Sig Dispense Refill  . Azelaic Acid (FINACEA) 15 % cream Apply 1 application topically 2 (two) times daily. After skin is thoroughly washed and patted dry, gently but thoroughly massage a thin film of azelaic acid cream into the affected area  twice daily, in the morning and evening.     . benzoyl peroxide 5 % gel Apply 1 application topically 2 (two) times daily.     . DULoxetine (CYMBALTA) 60 MG capsule TAKE 1 CAPSULE DAILY 90 capsule 1  . EPINEPHrine (EPIPEN 2-PAK) 0.3 mg/0.3 mL IJ SOAJ injection Inject 0.3 mLs (0.3 mg total) into the muscle once. 1 Device 1  . FIBER PO Take 2  capsules by mouth 2 (two) times daily.    Marland Kitchen loratadine (CLARITIN) 10 MG tablet Take 10 mg by mouth daily as needed for allergies.    Marland Kitchen omeprazole (PRILOSEC) 40 MG capsule Take 1 capsule (40 mg total) by mouth daily. (Patient taking differently: Take 40 mg by mouth at bedtime. ) 180 capsule 1  . Polyethylene Glycol 400 (BLINK TEARS) 0.25 % GEL Apply 1 application to eye daily as needed (dry eyes).    . pravastatin (PRAVACHOL) 20 MG tablet Take 1 tablet (20 mg total) by mouth at bedtime. (Patient taking differently: Take 10 mg by mouth at bedtime. ) 90 tablet 1   No current facility-administered medications on file prior to visit.     BP 127/66 (BP Location: Right Arm, Cuff Size: Normal)   Pulse 86   Temp 98.3 F (36.8 C) (Oral)   Resp 16   Ht 5\' 4"  (1.626 m)   Wt 160 lb 6.4 oz (72.8 kg)   LMP 05/14/1996   SpO2 98%   BMI 27.53 kg/m       Objective:   Physical Exam  Constitutional: She is oriented to person, place, and time. She appears well-developed and well-nourished.  HENT:  Head: Normocephalic and atraumatic.  Cardiovascular: Normal rate, regular rhythm and normal heart sounds.   No murmur heard. Pulmonary/Chest: Effort normal and breath sounds normal. No respiratory distress. She has no wheezes.  Musculoskeletal: She exhibits no edema.  Neurological: She is alert and oriented to person, place, and time.  Psychiatric: She has a normal mood and affect. Her behavior is normal. Judgment and thought content normal.          Assessment & Plan:  Depression/anxiety- stable. Continue cymbalta.  Hyperlipidemia- continues statin, obtain  follow up lipid panel.   Dysphagia- advised pt to arrange follow up with her GI specialist.  GERD- stable on PPI, continue same.

## 2016-12-13 ENCOUNTER — Telehealth: Payer: Self-pay | Admitting: Family

## 2016-12-13 NOTE — Addendum Note (Signed)
Addendum  created 12/13/16 1134 by Myrtie Soman, MD   Sign clinical note

## 2016-12-13 NOTE — Telephone Encounter (Signed)
Please confirm that pt is taking full 20mg  tab of pravastatin. If so, needs to increase to 40mg  once daily please. If she is taking 10mg  need to increase to 20mg .

## 2016-12-15 ENCOUNTER — Other Ambulatory Visit: Payer: Self-pay | Admitting: Family

## 2016-12-15 MED ORDER — PRAVASTATIN SODIUM 40 MG PO TABS: 40.0000 mg | ORAL_TABLET | Freq: Every day | ORAL | 1 refills | Status: DC

## 2016-12-15 NOTE — Telephone Encounter (Signed)
Spoke with pt. She reports that she has been taking 1 whole tablet (20mg ) every day. Advised pt of below and she voices understanding. New rx sent to [facility].

## 2016-12-17 NOTE — Telephone Encounter (Signed)
Our record indicates pt was supposed to stop Voltaren gel at discharge. Sent mychart message to pt to verify request?

## 2017-01-20 ENCOUNTER — Ambulatory Visit: Payer: Medicare Other | Admitting: Pulmonary Disease

## 2017-01-21 ENCOUNTER — Encounter: Payer: Self-pay | Admitting: Family

## 2017-01-31 ENCOUNTER — Encounter: Payer: Self-pay | Admitting: Family

## 2017-01-31 ENCOUNTER — Ambulatory Visit: Payer: Medicare Other | Admitting: Psychology

## 2017-02-01 MED ORDER — AZELAIC ACID 15 % EX GEL: CUTANEOUS | 3 refills | Status: DC

## 2017-02-01 MED ORDER — AZELAIC ACID 15 % EX GEL: 1.0000 "application " | Freq: Two times a day (BID) | CUTANEOUS | 3 refills | Status: DC

## 2017-02-02 ENCOUNTER — Encounter: Payer: Self-pay | Admitting: Pulmonary Disease

## 2017-02-03 ENCOUNTER — Encounter: Payer: Self-pay | Admitting: Family

## 2017-02-03 ENCOUNTER — Encounter: Payer: Self-pay | Admitting: Pulmonary Disease

## 2017-02-03 ENCOUNTER — Ambulatory Visit (INDEPENDENT_AMBULATORY_CARE_PROVIDER_SITE_OTHER): Payer: Medicare Other | Admitting: Pulmonary Disease

## 2017-02-03 DIAGNOSIS — G4733 Obstructive sleep apnea (adult) (pediatric): Secondary | ICD-10-CM | POA: Diagnosis not present

## 2017-02-03 DIAGNOSIS — F341 Dysthymic disorder: Secondary | ICD-10-CM | POA: Diagnosis not present

## 2017-02-03 NOTE — Patient Instructions (Signed)
CPAP is set at 10 cm & working well CPAP supplies will be renewed for a year

## 2017-02-03 NOTE — Assessment & Plan Note (Signed)
CPAP is set at 10 cm & working well CPAP supplies will be renewed for a year  Weight loss encouraged, compliance with goal of at least 4-6 hrs every night is the expectation. Advised against medications with sedative side effects Cautioned against driving when sleepy - understanding that sleepiness will vary on a day to day basis

## 2017-02-03 NOTE — Assessment & Plan Note (Signed)
Her insomnia is subacute and likely related to her recent surgery. She denies pain from her knee joint. I offered her antidepressants such as trazodone to help her sleep but she would like to defer

## 2017-02-03 NOTE — Progress Notes (Signed)
   Subjective:    Patient ID: Vanessa Oliver, female    DOB: Jan 08, 1951, 66 y.o.   MRN: [id number]  HPI  66 year old for FU of OSA She had a deviated septum that was repaired at age 47. She wears a mouth piece for pain in her temporomandibular joint.  She was started on CPAP in 2016 and this has improved her daytime somnolence and fatigue. Husband has also been diagnosed with OSA and just started on CPAP but continues to act out his dreams. She denies any problems with mask or pressure. She has had a knee replacement in 5 months ago and this is completely disrupted her sleep. She does not want to start on a sleeping pill. She uses melatonin and 1-2 Benadryl every night to help her sleep.  CPAP download shows excellent control of events on 10 cm with excellent compliance and minimal leak. She is a medium nasal mask  Significant tests/ events reviewed   PSG 07/2014-weight 163 pounds- RDI 23/hour, with 24 central apneas, total sleep time 277 minutes  Review of Systems Patient denies significant dyspnea,cough, hemoptysis,  chest pain, palpitations, pedal edema, orthopnea, paroxysmal nocturnal dyspnea, lightheadedness, nausea, vomiting, abdominal or  leg pains      Objective:   Physical Exam  Gen. Pleasant, well-nourished, in no distress ENT - no thrush, no post nasal drip Neck: No JVD, no thyromegaly, no carotid bruits Lungs: no use of accessory muscles, no dullness to percussion, clear without rales or rhonchi  Cardiovascular: Rhythm regular, heart sounds  normal, no murmurs or gallops, no peripheral edema Musculoskeletal: No deformities, no cyanosis or clubbing        Assessment & Plan:

## 2017-02-07 ENCOUNTER — Ambulatory Visit (INDEPENDENT_AMBULATORY_CARE_PROVIDER_SITE_OTHER): Payer: Medicare Other | Admitting: Psychology

## 2017-02-07 DIAGNOSIS — F331 Major depressive disorder, recurrent, moderate: Secondary | ICD-10-CM

## 2017-02-08 NOTE — Telephone Encounter (Signed)
Received fax from [employer] [place] [place] [facility] that [place] has been approved until 02/04/18. Emailed pt.

## 2017-02-17 ENCOUNTER — Ambulatory Visit: Payer: Self-pay | Admitting: Gastroenterology

## 2017-02-19 ENCOUNTER — Emergency Department (HOSPITAL_BASED_OUTPATIENT_CLINIC_OR_DEPARTMENT_OTHER)
Admission: EM | Admit: 2017-02-19 | Discharge: 2017-02-19 | Disposition: A | Discharge status: Home / Self Care | Payer: Medicare Other | Attending: Emergency Medicine | Admitting: Emergency Medicine

## 2017-02-19 ENCOUNTER — Emergency Department (HOSPITAL_BASED_OUTPATIENT_CLINIC_OR_DEPARTMENT_OTHER): Payer: Medicare Other

## 2017-02-19 ENCOUNTER — Encounter (HOSPITAL_BASED_OUTPATIENT_CLINIC_OR_DEPARTMENT_OTHER): Payer: Self-pay | Admitting: Emergency Medicine

## 2017-02-19 DIAGNOSIS — J209 Acute bronchitis, unspecified: Secondary | ICD-10-CM | POA: Insufficient documentation

## 2017-02-19 DIAGNOSIS — Z7982 Long term (current) use of aspirin: Secondary | ICD-10-CM | POA: Insufficient documentation

## 2017-02-19 DIAGNOSIS — J0100 Acute maxillary sinusitis, unspecified: Secondary | ICD-10-CM

## 2017-02-19 DIAGNOSIS — R05 Cough: Secondary | ICD-10-CM | POA: Diagnosis not present

## 2017-02-19 DIAGNOSIS — Z79899 Other long term (current) drug therapy: Secondary | ICD-10-CM | POA: Diagnosis not present

## 2017-02-19 DIAGNOSIS — J40 Bronchitis, not specified as acute or chronic: Secondary | ICD-10-CM

## 2017-02-19 DIAGNOSIS — Z7902 Long term (current) use of antithrombotics/antiplatelets: Secondary | ICD-10-CM | POA: Diagnosis not present

## 2017-02-19 MED ORDER — LEVOFLOXACIN 750 MG PO TABS: 750.0000 mg | ORAL_TABLET | Freq: Every day | ORAL | 0 refills | Status: DC

## 2017-02-19 MED ORDER — DOXYCYCLINE HYCLATE 100 MG PO TABS
100.0000 mg | ORAL_TABLET | Freq: Once | ORAL | Status: AC
  Administered 2017-02-19: 100 mg via ORAL
  Filled 2017-02-19: qty 1

## 2017-02-19 MED ORDER — PREDNISONE 20 MG PO TABS: 60.0000 mg | ORAL_TABLET | Freq: Every day | ORAL | 0 refills | Status: AC

## 2017-02-19 MED ORDER — ALBUTEROL SULFATE HFA 108 (90 BASE) MCG/ACT IN AERS
2.0000 | INHALATION_SPRAY | Freq: Once | RESPIRATORY_TRACT | Status: AC
Start: 2017-02-19 — End: 2017-02-19
  Administered 2017-02-19: 2 via RESPIRATORY_TRACT
  Filled 2017-02-19: qty 6.7

## 2017-02-19 MED ORDER — BENZONATATE 100 MG PO CAPS: 100.0000 mg | ORAL_CAPSULE | Freq: Three times a day (TID) | ORAL | 0 refills | Status: DC | PRN

## 2017-02-19 MED ORDER — PREDNISONE 10 MG PO TABS
60.0000 mg | ORAL_TABLET | Freq: Once | ORAL | Status: AC
  Administered 2017-02-19: 60 mg via ORAL
  Filled 2017-02-19: qty 1

## 2017-02-19 NOTE — ED Triage Notes (Signed)
"   I had a sore throat on Monday for the past 4-5 days and now my right ear hurts and I am coughing up green stuff"  Took Ibuprofen 400mg  and Mucinex this am.

## 2017-02-19 NOTE — ED Notes (Signed)
Patient transported to X-ray 

## 2017-02-19 NOTE — ED Provider Notes (Signed)
[facility] Provider Note   CSN: [id number] Arrival date & time: 02/19/17  1043     History   Chief Complaint Chief Complaint  Patient presents with  . Cough    HPI Vanessa Oliver is a 66 y.o. female.  HPI   66 yo F with h/o recurrent bronchitis here with a 4-5 day history of sore throat, cough, and nasal congestion. Sx started 5 days ago with mild sore throat and nasal congestion. Over the next 2 days, she developed right ear pressure/fullness, cough, and sputum production. She states that she has a h/o recurrent bronchitis and often requires steroids and albuterol. She denies any fevers but has felt fatigued. No diarrhea. No abdominal pain. Of note, she is here with her 56 year old mother who has similar sx for 2 weeks.  Past Medical History:  Diagnosis Date  . Allergy    takes Singulair at night  . Anxiety   . Arthritis   . Chronic back pain    stenosis  . Depression    takes CYmbalta daily  . Esophageal stricture   . Family history of adverse reaction to anesthesia    oldest brother had trouble with anesthesia a long time ago but can't recall what  . Fibromyalgia   . Fibromyalgia   . GERD (gastroesophageal reflux disease)    takes Omeprazole daily  . Hemorrhoids   . History of bronchitis   . Hyperlipidemia   . IBS (irritable bowel syndrome)   . Joint pain   . Joint swelling   . Plantar fasciitis   . Rosacea   . Sleep apnea    cpap- settings at 10 breathing   . TMJ (temporomandibular joint disorder)   . Vasovagal episode back in the 80's  . Vasovagal syncope   . Weakness    right leg    Patient Active Problem List   Diagnosis Date Noted  . OA (osteoarthritis) of knee 09/06/2016  . Aortic atherosclerosis ([place]) 05/07/2016  . Idiopathic urticaria 06/02/2015  . Chronic rhinitis 06/02/2015  . 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 Lumbar stenosis 02/11/2015  . Abdominal pain, chronic, right lower quadrant  01/07/2015  . Right hip pain 10/31/2014  . Vitamin D deficiency 10/31/2014  . Osteopenia 10/18/2014  . OSA (obstructive sleep apnea) 07/16/2014  . Migraines 07/16/2014  . TMJ (dislocation of temporomandibular joint) 07/16/2014  . HLD (hyperlipidemia) 03/22/2014  . S/P Nissen fundoplication May 3086 57/84/6962  . Dysphagia 01/24/2008  . DEPRESSION/ANXIETY 12/02/2007  . ESOPHAGEAL STRICTURE 12/02/2007  . GERD 12/02/2007  . Irritable bowel syndrome 12/02/2007  . Fibromyalgia 12/02/2007  . VASOVAGAL SYNCOPE 12/02/2007    Past Surgical History:  Procedure Laterality Date  . ABDOMINAL HYSTERECTOMY  1997  . CARPAL TUNNEL RELEASE Bilateral 2004  . CARPOMETACARPAL (CMC) FUSION OF THUMB  2015   thumb  . CATARACT EXTRACTION Right 2013   Dr Ellison Hughs  . COLONOSCOPY    . COLONOSCOPY WITH ESOPHAGOGASTRODUODENOSCOPY (EGD) AND ESOPHAGEAL DILATION (ED)    . EYE SURGERY     scar tissue remove  . EYE SURGERY  04/2016  . 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 KNEE ARTHROSCOPY Right 1994   x 3  . LUMBAR LAMINECTOMY/DECOMPRESSION MICRODISCECTOMY Right 02/11/2015   Procedure: Laminectomy and Foraminotomy - Right - L3-L4;  Surgeon: Earnie Larsson, MD;  Location: [facility];  Service: Neurosurgery;  Laterality: Right;  Laminectomy and Foraminotomy - Right - L3-L4  .  mallet finger Right 2011  . NASAL SEPTUM SURGERY  1970  . NISSEN FUNDOPLICATION  6720  . NISSEN FUNDOPLICATION    . RETINAL LASER PROCEDURE Right 02/01/2011   right eye   . TONSILLECTOMY  1981  . TOTAL KNEE ARTHROPLASTY Right 09/06/2016   Procedure: RIGHT TOTAL KNEE ARTHROPLASTY;  Surgeon: Gaynelle Arabian, MD;  Location: [facility];  Service: Orthopedics;  Laterality: Right;  . TRIGGER FINGER RELEASE Bilateral 2005  . VARICOSE VEIN SURGERY Bilateral 2007, 2009   left 2007, right 2009  . VITRECTOMY Right 2012    OB History    No data available       Home Medications    Prior to Admission medications   Medication Sig Start  Date End Date Taking? Authorizing Provider  aspirin 81 MG tablet Take 81 mg by mouth daily.   Yes [provider]  Azelaic Acid (FINACEA) 15 % cream Apply 1 application topically 2 (two) times daily. After skin is thoroughly washed and patted dry, gently but thoroughly massage a thin film of azelaic acid cream into the affected area twice daily, in the morning and evening. 02/01/17  Yes Debbrah Alar, NP  Azelaic Acid (FINACEA) 15 % cream Apply small amount to clean dry skin twice daily. 02/01/17  Yes Debbrah Alar, NP  benzoyl peroxide 5 % gel Apply 1 application topically 2 (two) times daily.    Yes [provider]  CALCIUM CITRATE PO Take 1 tablet by mouth daily.   Yes [provider]  cholecalciferol (VITAMIN D) 1000 units tablet Take 1,000 Units by mouth 2 (two) times daily.   Yes [provider]  DULoxetine (CYMBALTA) 60 MG capsule Take 1 capsule (60 mg total) by mouth daily. 12/10/16  Yes Debbrah Alar, NP  EPINEPHrine (EPIPEN 2-PAK) 0.3 mg/0.3 mL IJ SOAJ injection Inject 0.3 mLs (0.3 mg total) into the muscle once. 11/17/15  Yes Bobbitt, Sedalia Muta, MD  FIBER PO Take 2 capsules by mouth 2 (two) times daily.   Yes [provider]  loratadine (CLARITIN) 10 MG tablet Take 10 mg by mouth daily as needed for allergies.   Yes [provider]  Melatonin 10 MG TABS Take 1 tablet by mouth at bedtime.   Yes [provider]  Multiple Vitamin (MULTIVITAMIN) tablet Take 1 tablet by mouth daily.   Yes [provider]  omeprazole (PRILOSEC) 40 MG capsule Take 1 capsule (40 mg total) by mouth daily. 12/10/16  Yes Debbrah Alar, NP  Polyethylene Glycol 400 (BLINK TEARS) 0.25 % GEL Apply 1 application to eye daily as needed (dry eyes).   Yes [provider]  pravastatin (PRAVACHOL) 40 MG tablet Take 1 tablet (40 mg total) by mouth daily. 12/15/16  Yes Debbrah Alar, NP  Probiotic Product (PROBIOTIC DAILY PO)  Take 1 capsule by mouth daily.   Yes [provider]  vitamin C (ASCORBIC ACID) 500 MG tablet Take 500 mg by mouth daily.   Yes [provider]  VOLTAREN 1 % GEL Apply 1 application topically daily as needed. 10/05/16  Yes [provider]  VOLTAREN 1 % GEL APPLY 2 GRAMS TOPICALLY TWICE A DAY AS NEEDED 12/22/16  Yes Debbrah Alar, NP  benzonatate (TESSALON) 100 MG capsule Take 1 capsule (100 mg total) by mouth 3 (three) times daily as needed for cough. 02/19/17   Duffy Bruce, MD  diphenhydrAMINE (SOMINEX) 25 MG tablet Take 25 mg by mouth at bedtime as needed for sleep.    [provider]  levofloxacin (LEVAQUIN) 750 MG tablet Take 1 tablet (750 mg total) by mouth daily. 02/19/17   Duffy Bruce, MD  predniSONE (DELTASONE) 20 MG tablet Take 3 tablets (60 mg total) by mouth daily. 02/19/17 02/24/17  Duffy Bruce, MD    Family History Family History  Problem Relation Age of Onset  . Breast cancer Mother   . Arthritis Mother   . Hyperlipidemia Mother   . Hypertension Mother   . Lung cancer Father   . Pancreatic cancer Father   . Skin cancer Father   . Arthritis Father   . Hyperlipidemia Maternal Grandfather   . Heart disease Maternal Grandfather   . Stroke Maternal Grandfather   . Celiac disease Brother     Social History Social History  Substance Use Topics  . Smoking status: Never Smoker  . Smokeless tobacco: Never Used  . Alcohol use 0.0 oz/week     Comment: rarely     Allergies   Aleve [naproxen sodium]; Cefuroxime axetil; Chlorzoxazone; Oxycodone; Penicillins; Adhesive [tape]; Cataflam [diclofenac potassium]; Clarithromycin; Flagyl [metronidazole hcl]; Glucosamine; Guaifenesin & derivatives; Hydrocodone-acetaminophen; Ketorolac tromethamine; Pentazocine; Pneumovax [pneumococcal polysaccharide vaccine]; Prevnar [pneumococcal 13-val conj vacc]; Smz-tmp ds [sulfamethoxazole w/trimethoprim (co-trimoxazole)]; Tramadol; Trazodone and  nefazodone; and Latex   Review of Systems Review of Systems  Constitutional: Positive for chills and fatigue. Negative for fever.  HENT: Positive for congestion, ear pain, rhinorrhea and sore throat.   Eyes: Negative for visual disturbance.  Respiratory: Positive for cough, shortness of breath and wheezing.   Cardiovascular: Negative for chest pain and leg swelling.  Gastrointestinal: Negative for abdominal pain, diarrhea, nausea and vomiting.  Genitourinary: Negative for dysuria and flank pain.  Musculoskeletal: Negative for neck pain and neck stiffness.  Skin: Negative for rash and wound.  Allergic/Immunologic: Negative for immunocompromised state.  Neurological: Negative for syncope, weakness and headaches.  All other systems reviewed and are negative.    Physical Exam Updated Vital Signs BP (!) 154/68 (BP Location: Right Arm)   Pulse 84   Temp 97.8 F (36.6 C) (Oral)   Resp 20   Ht 5\' 4"  (1.626 m)   Wt 70.8 kg (156 lb)   LMP 05/14/1996   SpO2 100%   BMI 26.78 kg/m   Physical Exam  Constitutional: She is oriented to person, place, and time. She appears well-developed and well-nourished. No distress.  HENT:  Head: Normocephalic and atraumatic.  Right Ear: External ear normal. A middle ear effusion is present.  Left Ear: External ear normal. A middle ear effusion is present.  Nose: Mucosal edema present.  Mouth/Throat: Posterior oropharyngeal erythema present.  Eyes: Conjunctivae are normal.  Neck: Neck supple.  Cardiovascular: Normal rate, regular rhythm and normal heart sounds.  Exam reveals no friction rub.   No murmur heard. Pulmonary/Chest: Effort normal. No accessory muscle usage. No tachypnea. No respiratory distress. She has no wheezes. She has rhonchi (scattered, clear with coughing). She has no rales.  Abdominal: She exhibits no distension.  Musculoskeletal: She exhibits no edema.  Neurological: She is alert and oriented to person, place, and time. She  exhibits normal muscle tone.  Skin: Skin is warm. Capillary refill takes less than 2 seconds.  Psychiatric: She has a normal mood and affect.  Nursing note and vitals reviewed.    ED Treatments / Results  Labs (all labs ordered are listed, but only abnormal results are displayed) Labs Reviewed - No data to display  EKG  EKG Interpretation None       Radiology Dg Chest  2 View  Result Date: 02/19/2017 CLINICAL DATA:  Productive cough.  Low-grade fever. EXAM: CHEST  2 VIEW COMPARISON:  08/09/2016 FINDINGS: Normal heart size. Lungs clear. No pneumothorax. No pleural effusion. IMPRESSION: No active cardiopulmonary disease. Electronically Signed   By: Marybelle Killings M.D.   On: 02/19/2017 11:32    Procedures Procedures (including critical care time)  Medications Ordered in ED Medications  predniSONE (DELTASONE) tablet 60 mg (60 mg Oral Given 02/19/17 1214)  doxycycline (VIBRA-TABS) tablet 100 mg (100 mg Oral Given 02/19/17 1214)  albuterol (PROVENTIL HFA;VENTOLIN HFA) 108 (90 Base) MCG/ACT inhaler 2 puff (2 puffs Inhalation Given 02/19/17 1206)     Initial Impression / Assessment and Plan / ED Course  I have reviewed the triage vital signs and the nursing notes.  Pertinent labs & imaging results that were available during my care of the patient were reviewed by me and considered in my medical decision making (see chart for details).     66 yo F with PMHx recurrent bronchitis/possible RAD here with cough, nasal congestion, sore throat. Pt here with mother who has PNA. On exam, pt well appearing in NAD. CXr is clear. Exam, history is c/w likely sinusitis with superimposed bronchitis. Given her high risk exposure, will tx with Levaquin. I suspect she also ahs a component of reactive airways and will trial albuterol, steroids. She is o/w well appearing with normal WOB. She is tolerating PO. Satting well on RA. Will d/c home.  Final Clinical Impressions(s) / ED Diagnoses   Final  diagnoses:  Acute non-recurrent maxillary sinusitis  Bronchitis    New Prescriptions Discharge Medication List as of 02/19/2017 12:37 PM    START taking these medications   Details  benzonatate (TESSALON) 100 MG capsule Take 1 capsule (100 mg total) by mouth 3 (three) times daily as needed for cough., Starting Sat 02/19/2017, Print    levofloxacin (LEVAQUIN) 750 MG tablet Take 1 tablet (750 mg total) by mouth daily., Starting Sat 02/19/2017, Print    predniSONE (DELTASONE) 20 MG tablet Take 3 tablets (60 mg total) by mouth daily., Starting Sat 02/19/2017, Until Thu 02/24/2017, Print         Duffy Bruce, MD 02/19/17 [phone]

## 2017-02-21 ENCOUNTER — Ambulatory Visit: Payer: Self-pay | Admitting: Family

## 2017-02-23 ENCOUNTER — Ambulatory Visit (INDEPENDENT_AMBULATORY_CARE_PROVIDER_SITE_OTHER): Payer: Medicare Other | Admitting: Family

## 2017-02-23 ENCOUNTER — Encounter: Payer: Self-pay | Admitting: Family

## 2017-02-23 VITALS — BP 126/90 | HR 77 | Temp 97.8°F | Resp 18 | Ht 64.0 in | Wt 159.0 lb

## 2017-02-23 DIAGNOSIS — J329 Chronic sinusitis, unspecified: Secondary | ICD-10-CM

## 2017-02-23 DIAGNOSIS — J40 Bronchitis, not specified as acute or chronic: Secondary | ICD-10-CM | POA: Diagnosis not present

## 2017-02-23 NOTE — Progress Notes (Signed)
Subjective:    Patient ID: Vanessa Oliver, female    DOB: 1951-03-02, 66 y.o.   MRN: [id number]  HPI  Patient is a 66 year old female who presents today for ER follow-up. ER record is reviewed. The patient presented to the ER on 02/19/2017. She complained of mild sore throat and nasal congestion 2 days at time of her presentation to the ER. She also reported that she had right ear pressure and fullness, cough and sputum production. Chest x-ray was performed which was clear. ER provider felt that her symptoms were most likely consistent with sinusitis and superimposed bronchitis. The patient was treated with Levaquin. She was also given a trial of albuterol and, Tessalon, and oral steroids.  She reports still feeling tired. Reports that hearing is still muffled in the right ear.  Had some ear pain on Monday night.  Overall feeling some better.      Review of Systems    see HPI  Past Medical History:  Diagnosis Date  . Allergy    takes Singulair at night  . Anxiety   . Arthritis   . Chronic back pain    stenosis  . Depression    takes CYmbalta daily  . Esophageal stricture   . Family history of adverse reaction to anesthesia    oldest brother had trouble with anesthesia a long time ago but can't recall what  . Fibromyalgia   . Fibromyalgia   . GERD (gastroesophageal reflux disease)    takes Omeprazole daily  . Hemorrhoids   . History of bronchitis   . Hyperlipidemia   . IBS (irritable bowel syndrome)   . Joint pain   . Joint swelling   . Plantar fasciitis   . Rosacea   . Sleep apnea    cpap- settings at 10 breathing   . TMJ (temporomandibular joint disorder)   . Vasovagal episode back in the 80's  . Vasovagal syncope   . Weakness    right leg     Social History   Social History  . Marital status: Married    Spouse name: N/A  . Number of children: 0  . Years of education: N/A   Occupational History  . clerical [employer] History Main Topics    . Smoking status: Never Smoker  . Smokeless tobacco: Never Used  . Alcohol use 0.0 oz/week     Comment: rarely  . Drug use: No  . Sexual activity: Yes    Birth control/ protection: Surgical   Other Topics Concern  . Not on file   Social History Narrative   Works as an Glass blower/designer   Married- husband is 24 years older than her   Former Dance movement psychotherapist up up [employer]   Has cats   Enjoys reading   No children    Past Surgical History:  Procedure Laterality Date  . ABDOMINAL HYSTERECTOMY  1997  . CARPAL TUNNEL RELEASE Bilateral 2004  . CARPOMETACARPAL (CMC) FUSION OF THUMB  2015   thumb  . CATARACT EXTRACTION Right 2013   Dr Ellison Hughs  . COLONOSCOPY    . COLONOSCOPY WITH ESOPHAGOGASTRODUODENOSCOPY (EGD) AND ESOPHAGEAL DILATION (ED)    . EYE SURGERY     scar tissue remove  . EYE SURGERY  04/2016  . 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 KNEE ARTHROSCOPY Right 1994   x 3  . LUMBAR LAMINECTOMY/DECOMPRESSION MICRODISCECTOMY Right 02/11/2015   Procedure: Laminectomy and Foraminotomy - Right -  L3-L4;  Surgeon: Earnie Larsson, MD;  Location: [place] [facility];  Service: Neurosurgery;  Laterality: Right;  Laminectomy and Foraminotomy - Right - L3-L4  . mallet finger Right 2011  . NASAL SEPTUM SURGERY  1970  . NISSEN FUNDOPLICATION  7672  . NISSEN FUNDOPLICATION    . RETINAL LASER PROCEDURE Right 02/01/2011   right eye   . TONSILLECTOMY  1981  . TOTAL KNEE ARTHROPLASTY Right 09/06/2016   Procedure: RIGHT TOTAL KNEE ARTHROPLASTY;  Surgeon: Gaynelle Arabian, MD;  Location: [facility];  Service: Orthopedics;  Laterality: Right;  . TRIGGER FINGER RELEASE Bilateral 2005  . VARICOSE VEIN SURGERY Bilateral 2007, 2009   left 2007, right 2009  . VITRECTOMY Right 2012    Family History  Problem Relation Age of Onset  . Breast cancer Mother   . Arthritis Mother   . Hyperlipidemia Mother   . Hypertension Mother   . Lung cancer Father   . Pancreatic cancer Father   . Skin cancer Father   .  Arthritis Father   . Hyperlipidemia Maternal Grandfather   . Heart disease Maternal Grandfather   . Stroke Maternal Grandfather   . Celiac disease Brother     Allergies  Allergen Reactions  . Aleve [Naproxen Sodium]     Swelling, itching  . Cefuroxime Axetil     Hives / "throat swelling"  . Chlorzoxazone Rash and Other (See Comments)    "breathing problems"   . Oxycodone     Hallucinations, rash  . Penicillins Anaphylaxis and Hives    Has patient had a PCN reaction causing immediate rash, facial/tongue/throat swelling, SOB or lightheadedness with hypotension: Yes Has patient had a PCN reaction causing severe rash involving mucus membranes or skin necrosis: No Has patient had a PCN reaction that required hospitalization No Has patient had a PCN reaction occurring within the last 10 years: No If all of the above answers are "NO", then may proceed with Cephalosporin use.   . Adhesive [Tape]     rash  . Cataflam [Diclofenac Potassium]     Lip swelling  . Clarithromycin Diarrhea  . Flagyl [Metronidazole Hcl]     ?diarrhea  . Glucosamine Hives  . Guaifenesin & Derivatives     Stomach upset  . Hydrocodone-Acetaminophen     REACTION: rash, tightening of throat  . Ketorolac Tromethamine     ?reaction  . Pentazocine     Involuntary twitching  . Pneumovax [Pneumococcal Polysaccharide Vaccine] Other (See Comments)    Redness/swelling at site, nausea  . Prevnar [Pneumococcal 13-Val Conj Vacc] Other (See Comments)    Swelling, redness, nausea  . Smz-Tmp Ds [Sulfamethoxazole W/Trimethoprim (Co-Trimoxazole)]     ?unknown reaction  . Tramadol     constipation  . Trazodone And Nefazodone Other (See Comments)    Scratchy throat / congestion  . Latex Rash    Current Outpatient Prescriptions on File Prior to Visit  Medication Sig Dispense Refill  . aspirin 81 MG tablet Take 81 mg by mouth daily.    . Azelaic Acid (FINACEA) 15 % cream Apply 1 application topically 2 (two) times  daily. After skin is thoroughly washed and patted dry, gently but thoroughly massage a thin film of azelaic acid cream into the affected area twice daily, in the morning and evening. 90 g 3  . Azelaic Acid (FINACEA) 15 % cream Apply small amount to clean dry skin twice daily. 90 g 3  . benzonatate (TESSALON) 100 MG capsule Take 1 capsule (100 mg total)  by mouth 3 (three) times daily as needed for cough. 30 capsule 0  . benzoyl peroxide 5 % gel Apply 1 application topically 2 (two) times daily.     Marland Kitchen CALCIUM CITRATE PO Take 1 tablet by mouth daily.    . cholecalciferol (VITAMIN D) 1000 units tablet Take 1,000 Units by mouth 2 (two) times daily.    . diphenhydrAMINE (SOMINEX) 25 MG tablet Take 25 mg by mouth at bedtime as needed for sleep.    . DULoxetine (CYMBALTA) 60 MG capsule Take 1 capsule (60 mg total) by mouth daily. 90 capsule 1  . EPINEPHrine (EPIPEN 2-PAK) 0.3 mg/0.3 mL IJ SOAJ injection Inject 0.3 mLs (0.3 mg total) into the muscle once. 1 Device 1  . FIBER PO Take 2 capsules by mouth 2 (two) times daily.    Marland Kitchen levofloxacin (LEVAQUIN) 750 MG tablet Take 1 tablet (750 mg total) by mouth daily. 5 tablet 0  . loratadine (CLARITIN) 10 MG tablet Take 10 mg by mouth daily as needed for allergies.    . Melatonin 10 MG TABS Take 1 tablet by mouth at bedtime.    . Multiple Vitamin (MULTIVITAMIN) tablet Take 1 tablet by mouth daily.    Marland Kitchen omeprazole (PRILOSEC) 40 MG capsule Take 1 capsule (40 mg total) by mouth daily. 180 capsule 1  . Polyethylene Glycol 400 (BLINK TEARS) 0.25 % GEL Apply 1 application to eye daily as needed (dry eyes).    . pravastatin (PRAVACHOL) 40 MG tablet Take 1 tablet (40 mg total) by mouth daily. 90 tablet 1  . predniSONE (DELTASONE) 20 MG tablet Take 3 tablets (60 mg total) by mouth daily. 15 tablet 0  . Probiotic Product (PROBIOTIC DAILY PO) Take 1 capsule by mouth daily.    . vitamin C (ASCORBIC ACID) 500 MG tablet Take 500 mg by mouth daily.    . VOLTAREN 1 % GEL Apply 1  application topically daily as needed.    . VOLTAREN 1 % GEL APPLY 2 GRAMS TOPICALLY TWICE A DAY AS NEEDED 300 g 1   No current facility-administered medications on file prior to visit.     Pulse 77   Temp 97.8 F (36.6 C) (Oral)   Resp 18   Ht 5\' 4"  (1.626 m)   Wt 159 lb (72.1 kg)   LMP 05/14/1996   SpO2 100%   BMI 27.29 kg/m    Objective:   Physical Exam  Constitutional: She is oriented to person, place, and time. She appears well-developed and well-nourished.  HENT:  Head: Normocephalic and atraumatic.  Right Ear: Tympanic membrane, external ear and ear canal normal.  Left Ear: Tympanic membrane, external ear and ear canal normal.  Nose: Right sinus exhibits no maxillary sinus tenderness and no frontal sinus tenderness. Left sinus exhibits no maxillary sinus tenderness and no frontal sinus tenderness.  Mouth/Throat: No oropharyngeal exudate, posterior oropharyngeal edema or posterior oropharyngeal erythema.  Eyes: No scleral icterus.  Neck: Neck supple.  Cardiovascular: Normal rate, regular rhythm and normal heart sounds.   No murmur heard. Pulmonary/Chest: Effort normal and breath sounds normal. No respiratory distress. She has no wheezes.  Musculoskeletal: She exhibits no edema.  Lymphadenopathy:    She has no cervical adenopathy.  Neurological: She is alert and oriented to person, place, and time.  Psychiatric: She has a normal mood and affect. Her behavior is normal. Judgment and thought content normal.          Assessment & Plan:  Bronchitis/sinusitis- improving.  Advised  patient to complete steroids, Levaquin, and continue albuterol for now. She is advised to call if symptoms worsen, or if symptoms do not continue to improve.

## 2017-02-23 NOTE — Patient Instructions (Signed)
Continue current meds.  Call if symptoms worsen or if symptoms do not continue to improve.

## 2017-02-28 ENCOUNTER — Ambulatory Visit (INDEPENDENT_AMBULATORY_CARE_PROVIDER_SITE_OTHER): Payer: Medicare Other | Admitting: Psychology

## 2017-02-28 DIAGNOSIS — F331 Major depressive disorder, recurrent, moderate: Secondary | ICD-10-CM | POA: Diagnosis not present

## 2017-03-09 ENCOUNTER — Encounter: Payer: Self-pay | Admitting: Gastroenterology

## 2017-03-09 ENCOUNTER — Ambulatory Visit (INDEPENDENT_AMBULATORY_CARE_PROVIDER_SITE_OTHER): Payer: Medicare Other | Admitting: Gastroenterology

## 2017-03-09 VITALS — BP 130/76 | HR 76 | Ht 64.0 in | Wt 158.0 lb

## 2017-03-09 DIAGNOSIS — R1310 Dysphagia, unspecified: Secondary | ICD-10-CM

## 2017-03-09 DIAGNOSIS — K219 Gastro-esophageal reflux disease without esophagitis: Secondary | ICD-10-CM

## 2017-03-09 MED ORDER — PANTOPRAZOLE SODIUM 40 MG PO TBEC: 40.0000 mg | DELAYED_RELEASE_TABLET | Freq: Every day | ORAL | 5 refills | Status: DC

## 2017-03-09 NOTE — Patient Instructions (Signed)
If you are age 68 or older, your body mass index should be between 23-30. Your Body mass index is 27.12 kg/m. If this is out of the aforementioned range listed, please consider follow up with your Primary Care Provider.  If you are age 72 or younger, your body mass index should be between 19-25. Your Body mass index is 27.12 kg/m. If this is out of the aformentioned range listed, please consider follow up with your Primary Care Provider.   You have been scheduled for a Barium Esophogram at [facility] (1st floor of the hospital) on 03/21/17 at 1100 am. Please arrive 15 minutes prior to your appointment for registration. Make certain not to have anything to eat or drink 3 hours prior to your test. If you need to reschedule for any reason, please contact radiology at [phone] to do so. __________________________________________________________________ A barium swallow is an examination that concentrates on views of the esophagus. This tends to be a double contrast exam (barium and two liquids which, when combined, create a gas to distend the wall of the oesophagus) or single contrast (non-ionic iodine based). The study is usually tailored to your symptoms so a good history is essential. Attention is paid during the study to the form, structure and configuration of the esophagus, looking for functional disorders (such as aspiration, dysphagia, achalasia, motility and reflux) EXAMINATION You may be asked to change into a gown, depending on the type of swallow being performed. A radiologist and radiographer will perform the procedure. The radiologist will advise you of the type of contrast selected for your procedure and direct you during the exam. You will be asked to stand, sit or lie in several different positions and to hold a small amount of fluid in your mouth before being asked to swallow while the imaging is performed .In some instances you may be asked to swallow barium coated  marshmallows to assess the motility of a solid food bolus. The exam can be recorded as a digital or video fluoroscopy procedure. POST PROCEDURE It will take 1-2 days for the barium to pass through your system. To facilitate this, it is important, unless otherwise directed, to increase your fluids for the next 24-48hrs and to resume your normal diet.  This test typically takes about 30 minutes to perform. ________________________________________________________________________  DISCONTINUE Prilosec.  We have sent the following medications to your pharmacy for you to pick up at your convenience: Pantoprazole 40 mg  Thank you for choosing me and [facility].   Alonza Bogus, PA-C

## 2017-03-09 NOTE — Progress Notes (Signed)
03/09/2017 Vanessa Oliver [id number] 1951-02-28   HISTORY OF PRESENT ILLNESS:  This is a 66 year old female who is known to Dr. Henrene Pastor.  More recently she had been seen at a GI facility in [facility] where she had EGD with dilation.  I do not have the actual report, but have a pathology report showing that biopsies throughout the esophagus were normal.  She tells me that recently she has been having dysphagia to pills. Says that they feel like they get stuck in her esophagus. Does not have any problems swallowing solid food or liquids. She says that this issue did not really get better after her EGD with dilation last year. She denies any pain. She says that sometimes she just feels nauseous and feels like nothing tastes good anymore. She does have history of fundoplication in 2130. She is still on omeprazole 40 mg daily. Uses occasional Gaviscon if needed.   Past Medical History:  Diagnosis Date  . Allergy    takes Singulair at night  . Anxiety   . Arthritis   . Chronic back pain    stenosis  . Depression    takes CYmbalta daily  . Esophageal stricture   . Family history of adverse reaction to anesthesia    oldest brother had trouble with anesthesia a long time ago but can't recall what  . Fibromyalgia   . GERD (gastroesophageal reflux disease)    takes Omeprazole daily  . Hemorrhoids   . History of bronchitis   . Hyperlipidemia   . IBS (irritable bowel syndrome)   . Joint pain   . Joint swelling   . Plantar fasciitis   . Rosacea   . Sleep apnea    cpap- settings at 10 breathing   . TMJ (temporomandibular joint disorder)   . Vasovagal episode back in the 80's  . Vasovagal syncope   . Weakness    right leg   Past Surgical History:  Procedure Laterality Date  . ABDOMINAL HYSTERECTOMY  1997  . CARPAL TUNNEL RELEASE Bilateral 2004  . CARPOMETACARPAL (CMC) FUSION OF THUMB  2015   thumb  . CATARACT EXTRACTION Right 2013   Dr Ellison Hughs  . CATARACT EXTRACTION Left 04/2016   . COLONOSCOPY    . COLONOSCOPY WITH ESOPHAGOGASTRODUODENOSCOPY (EGD) AND ESOPHAGEAL DILATION (ED)    . EYE SURGERY     scar tissue removed after cataract surgery  . 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 KNEE ARTHROSCOPY Right 1994   x 3  . KNEE SURGERY Right 01/21/2006  . LUMBAR LAMINECTOMY/DECOMPRESSION MICRODISCECTOMY Right 02/11/2015   Procedure: Laminectomy and Foraminotomy - Right - L3-L4;  Surgeon: Earnie Larsson, MD;  Location: [place] [facility];  Service: Neurosurgery;  Laterality: Right;  Laminectomy and Foraminotomy - Right - L3-L4  . mallet finger Right 2011  . NASAL SEPTUM SURGERY  1970  . NISSEN FUNDOPLICATION  8657  . RETINAL LASER PROCEDURE Right 02/01/2011   right eye   . TONSILLECTOMY  1981  . TOTAL KNEE ARTHROPLASTY Right 09/06/2016   Procedure: RIGHT TOTAL KNEE ARTHROPLASTY;  Surgeon: Gaynelle Arabian, MD;  Location: [facility];  Service: Orthopedics;  Laterality: Right;  . TRIGGER FINGER RELEASE Bilateral 2005  . VARICOSE VEIN SURGERY Bilateral 2007, 2009   left 2007, right 2009  . VITRECTOMY Right 2012    reports that she has never smoked. She has never used smokeless tobacco. She reports that she drinks alcohol. She reports that she does not use  drugs. family history includes Arthritis in her father and mother; Breast cancer in her mother; Celiac disease in her brother; Heart disease in her maternal grandfather; Hyperlipidemia in her maternal grandfather and mother; Hypertension in her mother; Lung cancer in her father; Pancreatic cancer in her father; Skin cancer in her father; Stroke in her maternal grandfather. Allergies  Allergen Reactions  . Aleve [Naproxen Sodium]     Swelling, itching  . Cefuroxime Axetil     Hives / "throat swelling"  . Chlorzoxazone Rash and Other (See Comments)    "breathing problems"   . Oxycodone     Hallucinations, rash  . Penicillins Anaphylaxis and Hives    Has patient had a PCN reaction causing immediate rash, facial/tongue/throat  swelling, SOB or lightheadedness with hypotension: Yes Has patient had a PCN reaction causing severe rash involving mucus membranes or skin necrosis: No Has patient had a PCN reaction that required hospitalization No Has patient had a PCN reaction occurring within the last 10 years: No If all of the above answers are "NO", then may proceed with Cephalosporin use.   . Adhesive [Tape]     rash  . Cataflam [Diclofenac Potassium]     Lip swelling  . Clarithromycin Diarrhea  . Flagyl [Metronidazole Hcl]     ?diarrhea  . Glucosamine Hives  . Guaifenesin & Derivatives     Stomach upset  . Hydrocodone-Acetaminophen     REACTION: rash, tightening of throat  . Ketorolac Tromethamine     ?reaction  . Pentazocine     Involuntary twitching  . Pneumovax [Pneumococcal Polysaccharide Vaccine] Other (See Comments)    Redness/swelling at site, nausea  . Prevnar [Pneumococcal 13-Val Conj Vacc] Other (See Comments)    Swelling, redness, nausea  . Smz-Tmp Ds [Sulfamethoxazole W/Trimethoprim (Co-Trimoxazole)]     ?unknown reaction  . Tramadol     constipation  . Trazodone And Nefazodone Other (See Comments)    Scratchy throat / congestion  . Latex Rash      Outpatient Encounter Prescriptions as of 03/09/2017  Medication Sig  . aspirin 81 MG tablet Take 81 mg by mouth daily.  . Azelaic Acid (FINACEA) 15 % cream Apply 1 application topically 2 (two) times daily. After skin is thoroughly washed and patted dry, gently but thoroughly massage a thin film of azelaic acid cream into the affected area twice daily, in the morning and evening.  . Azelaic Acid (FINACEA) 15 % cream Apply small amount to clean dry skin twice daily.  . benzonatate (TESSALON) 100 MG capsule Take 1 capsule (100 mg total) by mouth 3 (three) times daily as needed for cough.  . benzoyl peroxide 5 % gel Apply 1 application topically 2 (two) times daily.   Marland Kitchen CALCIUM CITRATE PO Take 1 tablet by mouth daily.  . cholecalciferol (VITAMIN  D) 1000 units tablet Take 1,000 Units by mouth 2 (two) times daily.  . diphenhydrAMINE (SOMINEX) 25 MG tablet Take 25 mg by mouth at bedtime as needed for sleep.  . DULoxetine (CYMBALTA) 60 MG capsule Take 1 capsule (60 mg total) by mouth daily.  Marland Kitchen EPINEPHrine (EPIPEN 2-PAK) 0.3 mg/0.3 mL IJ SOAJ injection Inject 0.3 mLs (0.3 mg total) into the muscle once.  Marland Kitchen FIBER PO Take 2 capsules by mouth 2 (two) times daily.  . fluticasone (FLONASE) 50 MCG/ACT nasal spray Place 1 spray into both nostrils at bedtime.  Marland Kitchen loratadine (CLARITIN) 10 MG tablet Take 10 mg by mouth daily as needed for allergies.  . Multiple  Vitamin (MULTIVITAMIN) tablet Take 1 tablet by mouth daily.  Marland Kitchen omeprazole (PRILOSEC) 40 MG capsule Take 1 capsule (40 mg total) by mouth daily.  . Polyethylene Glycol 400 (BLINK TEARS) 0.25 % GEL Apply 1 application to eye daily as needed (dry eyes).  . pravastatin (PRAVACHOL) 40 MG tablet Take 1 tablet (40 mg total) by mouth daily.  . Probiotic Product (PROBIOTIC DAILY PO) Take 1 capsule by mouth daily.  . vitamin C (ASCORBIC ACID) 500 MG tablet Take 500 mg by mouth daily.  . VOLTAREN 1 % GEL Apply 1 application topically daily as needed.  . VOLTAREN 1 % GEL APPLY 2 GRAMS TOPICALLY TWICE A DAY AS NEEDED  . [DISCONTINUED] levofloxacin (LEVAQUIN) 750 MG tablet Take 1 tablet (750 mg total) by mouth daily.  . [DISCONTINUED] Melatonin 10 MG TABS Take 1 tablet by mouth at bedtime.   No facility-administered encounter medications on file as of 03/09/2017.     REVIEW OF SYSTEMS  : All other systems reviewed and negative except where noted in the History of Present Illness.   PHYSICAL EXAM: BP 130/76   Pulse 76   Ht 5\' 4"  (1.626 m)   Wt 158 lb (71.7 kg)   LMP 05/14/1996   BMI 27.12 kg/m  General: Well developed white female in no acute distress Head: Normocephalic and atraumatic Eyes:  Sclerae anicteric, conjunctiva pink. Ears: Normal auditory acuity Lungs: Clear throughout to  auscultation; no increased WOB. Heart: Regular rate and rhythm; no M/R/G. Abdomen: Soft, non-distended. Normal bowel sounds.  Non-tender. Musculoskeletal: Symmetrical with no gross deformities  Skin: No lesions on visible extremities Extremities: No edema  Neurological: Alert oriented x 4, grossly non-focal Psychological:  Alert and cooperative. Normal mood and affect  ASSESSMENT AND PLAN: *66 year old female with complaints of dysphagia:  This has been a recurrent issue.  Has Nissen fundoplication as well.  Had EGD with dilation for stricture in 11/2015 [facility]  I am not convinced that this is a recurrent stricture.  ? Dysmotility.  I am going to have her discontinue her omeprazole unchanged her to pantoprazole 40 mg daily. Will check barium esophagram as well.  CC:  Debbrah Alar, NP

## 2017-03-10 NOTE — Progress Notes (Signed)
Agree with initial assessment and plans 

## 2017-03-15 ENCOUNTER — Encounter: Payer: Self-pay | Admitting: Family

## 2017-03-17 ENCOUNTER — Encounter: Payer: Self-pay | Admitting: Family

## 2017-03-17 NOTE — Progress Notes (Deleted)
Subjective:   Vanessa Oliver is a 66 y.o. female who presents for Medicare Annual (Subsequent) preventive examination.  Review of Systems:  No ROS.  Medicare Wellness Visit. Additional risk factors are reflected in the social history   Sleep patterns:  Home Safety/Smoke Alarms: Feels safe in home. Smoke alarms in place.  Living environment; residence and Firearm Safety:  [place] Safety/Bike Helmet: Wears seat belt.   Female:   Pap-       Mammo-   04/22/16:   BI-RADS CATEGORY  1: Negative. Dexa scan-  10/17/14:    osteopenia   CCS- 02/26/08. Recall 10 yrs.    Objective:     Vitals: LMP 05/14/1996   There is no height or weight on file to calculate BMI.   Tobacco History  Smoking Status  . Never Smoker  Smokeless Tobacco  . Never Used     Counseling given: Not Answered   Past Medical History:  Diagnosis Date  . Allergy    takes Singulair at night  . Anxiety   . Arthritis   . Chronic back pain    stenosis  . Depression    takes CYmbalta daily  . Esophageal stricture   . Family history of adverse reaction to anesthesia    oldest brother had trouble with anesthesia a long time ago but can't recall what  . Fibromyalgia   . GERD (gastroesophageal reflux disease)    takes Omeprazole daily  . Hemorrhoids   . History of bronchitis   . Hyperlipidemia   . IBS (irritable bowel syndrome)   . Joint pain   . Joint swelling   . Plantar fasciitis   . Rosacea   . Sleep apnea    cpap- settings at 10 breathing   . TMJ (temporomandibular joint disorder)   . Vasovagal episode back in the 80's  . Vasovagal syncope   . Weakness    right leg   Past Surgical History:  Procedure Laterality Date  . ABDOMINAL HYSTERECTOMY  1997  . CARPAL TUNNEL RELEASE Bilateral 2004  . CARPOMETACARPAL (CMC) FUSION OF THUMB  2015   thumb  . CATARACT EXTRACTION Right 2013   Dr Ellison Hughs  . CATARACT EXTRACTION Left 04/2016  . COLONOSCOPY    . COLONOSCOPY WITH ESOPHAGOGASTRODUODENOSCOPY  (EGD) AND ESOPHAGEAL DILATION (ED)    . EYE SURGERY     scar tissue removed after cataract surgery  . 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 KNEE ARTHROSCOPY Right 1994   x 3  . KNEE SURGERY Right 01/21/2006  . LUMBAR LAMINECTOMY/DECOMPRESSION MICRODISCECTOMY Right 02/11/2015   Procedure: Laminectomy and Foraminotomy - Right - L3-L4;  Surgeon: Earnie Larsson, MD;  Location: [facility];  Service: Neurosurgery;  Laterality: Right;  Laminectomy and Foraminotomy - Right - L3-L4  . mallet finger Right 2011  . NASAL SEPTUM SURGERY  1970  . NISSEN FUNDOPLICATION  0350  . RETINAL LASER PROCEDURE Right 02/01/2011   right eye   . TONSILLECTOMY  1981  . TOTAL KNEE ARTHROPLASTY Right 09/06/2016   Procedure: RIGHT TOTAL KNEE ARTHROPLASTY;  Surgeon: Gaynelle Arabian, MD;  Location: [facility];  Service: Orthopedics;  Laterality: Right;  . TRIGGER FINGER RELEASE Bilateral 2005  . VARICOSE VEIN SURGERY Bilateral 2007, 2009   left 2007, right 2009  . VITRECTOMY Right 2012   Family History  Problem Relation Age of Onset  . Breast cancer Mother   . Arthritis Mother   . Hyperlipidemia Mother   .  Hypertension Mother   . Lung cancer Father   . Pancreatic cancer Father   . Skin cancer Father   . Arthritis Father   . Hyperlipidemia Maternal Grandfather   . Heart disease Maternal Grandfather   . Stroke Maternal Grandfather   . Celiac disease Brother    History  Sexual Activity  . Sexual activity: Yes  . Birth control/ protection: Surgical    Outpatient Encounter Prescriptions as of 03/25/2017  Medication Sig  . aspirin 81 MG tablet Take 81 mg by mouth daily.  . Azelaic Acid (FINACEA) 15 % cream Apply 1 application topically 2 (two) times daily. After skin is thoroughly washed and patted dry, gently but thoroughly massage a thin film of azelaic acid cream into the affected area twice daily, in the morning and evening.  . Azelaic Acid (FINACEA) 15 % cream Apply small amount to clean dry skin twice  daily.  . benzonatate (TESSALON) 100 MG capsule Take 1 capsule (100 mg total) by mouth 3 (three) times daily as needed for cough.  . benzoyl peroxide 5 % gel Apply 1 application topically 2 (two) times daily.   Marland Kitchen CALCIUM CITRATE PO Take 1 tablet by mouth daily.  . cholecalciferol (VITAMIN D) 1000 units tablet Take 1,000 Units by mouth 2 (two) times daily.  . diphenhydrAMINE (SOMINEX) 25 MG tablet Take 25 mg by mouth at bedtime as needed for sleep.  . DULoxetine (CYMBALTA) 60 MG capsule Take 1 capsule (60 mg total) by mouth daily.  Marland Kitchen EPINEPHrine (EPIPEN 2-PAK) 0.3 mg/0.3 mL IJ SOAJ injection Inject 0.3 mLs (0.3 mg total) into the muscle once.  Marland Kitchen FIBER PO Take 2 capsules by mouth 2 (two) times daily.  . fluticasone (FLONASE) 50 MCG/ACT nasal spray Place 1 spray into both nostrils at bedtime.  Marland Kitchen loratadine (CLARITIN) 10 MG tablet Take 10 mg by mouth daily as needed for allergies.  . Multiple Vitamin (MULTIVITAMIN) tablet Take 1 tablet by mouth daily.  . pantoprazole (PROTONIX) 40 MG tablet Take 1 tablet (40 mg total) by mouth daily. 30 minutes before breakfast or dinner.  . Polyethylene Glycol 400 (BLINK TEARS) 0.25 % GEL Apply 1 application to eye daily as needed (dry eyes).  . pravastatin (PRAVACHOL) 40 MG tablet Take 1 tablet (40 mg total) by mouth daily.  . Probiotic Product (PROBIOTIC DAILY PO) Take 1 capsule by mouth daily.  . vitamin C (ASCORBIC ACID) 500 MG tablet Take 500 mg by mouth daily.  . VOLTAREN 1 % GEL Apply 1 application topically daily as needed.  . VOLTAREN 1 % GEL APPLY 2 GRAMS TOPICALLY TWICE A DAY AS NEEDED   No facility-administered encounter medications on file as of 03/25/2017.     Activities of Daily Living In your present state of health, do you have any difficulty performing the following activities: 09/06/2016 09/01/2016  Hearing? N N  Vision? Y N  Comment - glasses   Difficulty concentrating or making decisions? N N  Walking or climbing stairs? Y Y  Dressing or  bathing? N N  Doing errands, shopping? N N  Preparing Food and eating ? - -  Using the Toilet? - -  In the past six months, have you accidently leaked urine? - -  Do you have problems with loss of bowel control? - -  Managing your Medications? - -  Managing your Finances? - -  Housekeeping or managing your Housekeeping? - -  Some recent data might be hidden    Patient Care Team: Inda Castle,  Lenna Sciara, NP as PCP - General (Internal Medicine) Gaynelle Arabian, MD as Consulting Physician (Orthopedic Surgery) Earnie Larsson, MD as Consulting Physician (Neurosurgery) Laurence Aly, [place] as Consulting Physician (Optometry) Wylene Simmer, MD as Consulting Physician (Orthopedic Surgery) Susa Day, MD as Consulting Physician (Orthopedic Surgery) Rigoberto Noel, MD as Consulting Physician (Pulmonary Disease)    Assessment:    Physical assessment deferred to PCP.  Exercise Activities and Dietary recommendations   Diet (meal preparation, eat out, water intake, caffeinated beverages, dairy products, fruits and vegetables): {Desc; diets:16563} Breakfast: Lunch:  Dinner:      Goals    . Healthy lifestyle    . Increase physical activity      Fall Risk Fall Risk  08/09/2016 05/18/2016 05/18/2016 03/19/2016 11/17/2015  Falls in the past year? No No No Yes Yes  Number falls in past yr: - - - 1 1  Injury with Fall? - - - No No   Depression Screen PHQ 2/9 Scores 12/10/2016 08/09/2016 05/18/2016 03/19/2016  PHQ - 2 Score 2 1 2  0  PHQ- 9 Score 3 - - -     Cognitive Function MMSE - Mini Mental State Exam 03/19/2016  Orientation to time 5  Orientation to Place 5  Registration 3  Attention/ Calculation 5  Recall 3  Language- name 2 objects 2  Language- repeat 1  Language- follow 3 step command 3  Language- read & follow direction 1  Write a sentence 1  Copy design 1  Total score 30        Immunization History  Administered Date(s) Administered  . Influenza, High Dose Seasonal PF 03/19/2016  .  Influenza,inj,Quad PF,6+ Mos 03/14/2015  . Influenza-Unspecified 03/23/2014  . Pneumococcal Conjugate-13 09/23/2014  . Td 02/16/2011  . Zoster 08/08/2009   Screening Tests Health Maintenance  Topic Date Due  . INFLUENZA VACCINE  02/09/2017  . COLONOSCOPY  02/25/2018  . MAMMOGRAM  04/22/2018  . TETANUS/TDAP  02/15/2021  . DEXA SCAN  Completed  . Hepatitis C Screening  Completed      Plan:   ***   I have personally reviewed and noted the following in the patient's chart:   . Medical and social history . Use of alcohol, tobacco or illicit drugs  . Current medications and supplements . Functional ability and status . Nutritional status . Physical activity . Advanced directives . List of other physicians . Hospitalizations, surgeries, and ER visits in previous 12 months . Vitals . Screenings to include cognitive, depression, and falls . Referrals and appointments  In addition, I have reviewed and discussed with patient certain preventive protocols, quality metrics, and best practice recommendations. A written personalized care plan for preventive services as well as general preventive health recommendations were provided to patient.     Shela Nevin, [place]  03/17/2017

## 2017-03-18 ENCOUNTER — Ambulatory Visit (INDEPENDENT_AMBULATORY_CARE_PROVIDER_SITE_OTHER): Payer: Medicare Other | Admitting: Family

## 2017-03-18 VITALS — BP 115/61 | HR 81 | Temp 98.2°F | Ht 64.0 in | Wt 157.0 lb

## 2017-03-18 DIAGNOSIS — J019 Acute sinusitis, unspecified: Secondary | ICD-10-CM

## 2017-03-18 DIAGNOSIS — Z23 Encounter for immunization: Secondary | ICD-10-CM | POA: Diagnosis not present

## 2017-03-18 MED ORDER — DOXYCYCLINE HYCLATE 100 MG PO TABS: 100.0000 mg | ORAL_TABLET | Freq: Two times a day (BID) | ORAL | 0 refills | Status: DC

## 2017-03-18 NOTE — Patient Instructions (Signed)
Take claritin every day. Continue flonase.  Begin doxycycline for Sinus infection.  Call if new/worsening symptoms or if not improved in 1 week.

## 2017-03-18 NOTE — Progress Notes (Signed)
Subjective:    Patient ID: Vanessa Oliver, female    DOB: 1951-07-12, 66 y.o.   MRN: [id number]  HPI   She uses flonase every night as well as claritin. Not using every day.  Feels "so stuffed up on the right side (right cheek)." She has a previous history of a deviated septum which she reports was repaired remotely. She was treated for bronchitis back in mid August and reports that she's continued to have some ear pain as well as some sinus pressure since that time. She denies fever.    Review of Systems    see HPI  Past Medical History:  Diagnosis Date  . Allergy    takes Singulair at night  . Anxiety   . Arthritis   . Chronic back pain    stenosis  . Depression    takes CYmbalta daily  . Esophageal stricture   . Family history of adverse reaction to anesthesia    oldest brother had trouble with anesthesia a long time ago but can't recall what  . Fibromyalgia   . GERD (gastroesophageal reflux disease)    takes Omeprazole daily  . Hemorrhoids   . History of bronchitis   . Hyperlipidemia   . IBS (irritable bowel syndrome)   . Joint pain   . Joint swelling   . Plantar fasciitis   . Rosacea   . Sleep apnea    cpap- settings at 10 breathing   . TMJ (temporomandibular joint disorder)   . Vasovagal episode back in the 80's  . Vasovagal syncope   . Weakness    right leg     Social History   Social History  . Marital status: Married    Spouse name: N/A  . Number of children: 0  . Years of education: N/A   Occupational History  . 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 Smoking status: Never Smoker  . Smokeless tobacco: Never Used  . Alcohol use 0.0 oz/week     Comment: rarely  . Drug use: No  . Sexual activity: Yes    Birth control/ protection: Surgical   Other Topics Concern  . Not on file   Social History Narrative   Works as an Glass blower/designer   Married- husband is 34 years older than her   Former Dance movement psychotherapist up up [employer]   Has  cats   Enjoys reading   No children    Past Surgical History:  Procedure Laterality Date  . ABDOMINAL HYSTERECTOMY  1997  . CARPAL TUNNEL RELEASE Bilateral 2004  . CARPOMETACARPAL (CMC) FUSION OF THUMB  2015   thumb  . CATARACT EXTRACTION Right 2013   Dr Ellison Hughs  . CATARACT EXTRACTION Left 04/2016  . COLONOSCOPY    . COLONOSCOPY WITH ESOPHAGOGASTRODUODENOSCOPY (EGD) AND ESOPHAGEAL DILATION (ED)    . EYE SURGERY     scar tissue removed after cataract surgery  . 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 KNEE ARTHROSCOPY Right 1994   x 3  . KNEE SURGERY Right 01/21/2006  . LUMBAR LAMINECTOMY/DECOMPRESSION MICRODISCECTOMY Right 02/11/2015   Procedure: Laminectomy and Foraminotomy - Right - L3-L4;  Surgeon: Earnie Larsson, MD;  Location: [facility];  Service: Neurosurgery;  Laterality: Right;  Laminectomy and Foraminotomy - Right - L3-L4  . mallet finger Right 2011  . NASAL SEPTUM SURGERY  1970  . NISSEN FUNDOPLICATION  2993  . RETINAL LASER PROCEDURE Right 02/01/2011  right eye   . TONSILLECTOMY  1981  . TOTAL KNEE ARTHROPLASTY Right 09/06/2016   Procedure: RIGHT TOTAL KNEE ARTHROPLASTY;  Surgeon: Gaynelle Arabian, MD;  Location: [facility];  Service: Orthopedics;  Laterality: Right;  . TRIGGER FINGER RELEASE Bilateral 2005  . VARICOSE VEIN SURGERY Bilateral 2007, 2009   left 2007, right 2009  . VITRECTOMY Right 2012    Family History  Problem Relation Age of Onset  . Breast cancer Mother   . Arthritis Mother   . Hyperlipidemia Mother   . Hypertension Mother   . Lung cancer Father   . Pancreatic cancer Father   . Skin cancer Father   . Arthritis Father   . Hyperlipidemia Maternal Grandfather   . Heart disease Maternal Grandfather   . Stroke Maternal Grandfather   . Celiac disease Brother     Allergies  Allergen Reactions  . Aleve [Naproxen Sodium]     Swelling, itching  . Cefuroxime Axetil     Hives / "throat swelling"  . Chlorzoxazone Rash and Other (See Comments)      "breathing problems"   . Oxycodone     Hallucinations, rash  . Penicillins Anaphylaxis and Hives    Has patient had a PCN reaction causing immediate rash, facial/tongue/throat swelling, SOB or lightheadedness with hypotension: Yes Has patient had a PCN reaction causing severe rash involving mucus membranes or skin necrosis: No Has patient had a PCN reaction that required hospitalization No Has patient had a PCN reaction occurring within the last 10 years: No If all of the above answers are "NO", then may proceed with Cephalosporin use.   . Adhesive [Tape]     rash  . Cataflam [Diclofenac Potassium]     Lip swelling  . Clarithromycin Diarrhea  . Flagyl [Metronidazole Hcl]     ?diarrhea  . Glucosamine Hives  . Guaifenesin & Derivatives     Stomach upset  . Hydrocodone-Acetaminophen     REACTION: rash, tightening of throat  . Ketorolac Tromethamine     ?reaction  . Pentazocine     Involuntary twitching  . Pneumovax [Pneumococcal Polysaccharide Vaccine] Other (See Comments)    Redness/swelling at site, nausea  . Prevnar [Pneumococcal 13-Val Conj Vacc] Other (See Comments)    Swelling, redness, nausea  . Smz-Tmp Ds [Sulfamethoxazole W/Trimethoprim (Co-Trimoxazole)]     ?unknown reaction  . Tramadol     constipation  . Trazodone And Nefazodone Other (See Comments)    Scratchy throat / congestion  . Latex Rash    Current Outpatient Prescriptions on File Prior to Visit  Medication Sig Dispense Refill  . aspirin 81 MG tablet Take 81 mg by mouth daily.    . Azelaic Acid (FINACEA) 15 % cream Apply small amount to clean dry skin twice daily. 90 g 3  . benzonatate (TESSALON) 100 MG capsule Take 1 capsule (100 mg total) by mouth 3 (three) times daily as needed for cough. 30 capsule 0  . benzoyl peroxide 5 % gel Apply 1 application topically 2 (two) times daily.     Marland Kitchen CALCIUM CITRATE PO Take 1 tablet by mouth daily.    . cholecalciferol (VITAMIN D) 1000 units tablet Take 1,000  Units by mouth 2 (two) times daily.    . diphenhydrAMINE (SOMINEX) 25 MG tablet Take 25 mg by mouth at bedtime as needed for sleep.    . DULoxetine (CYMBALTA) 60 MG capsule Take 1 capsule (60 mg total) by mouth daily. 90 capsule 1  . EPINEPHrine (EPIPEN 2-PAK)  0.3 mg/0.3 mL IJ SOAJ injection Inject 0.3 mLs (0.3 mg total) into the muscle once. 1 Device 1  . FIBER PO Take 2 capsules by mouth 2 (two) times daily.    . fluticasone (FLONASE) 50 MCG/ACT nasal spray Place 1 spray into both nostrils at bedtime.    Marland Kitchen loratadine (CLARITIN) 10 MG tablet Take 10 mg by mouth daily as needed for allergies.    . Multiple Vitamin (MULTIVITAMIN) tablet Take 1 tablet by mouth daily.    . pantoprazole (PROTONIX) 40 MG tablet Take 1 tablet (40 mg total) by mouth daily. 30 minutes before breakfast or dinner. 30 tablet 5  . Polyethylene Glycol 400 (BLINK TEARS) 0.25 % GEL Apply 1 application to eye daily as needed (dry eyes).    . pravastatin (PRAVACHOL) 40 MG tablet Take 1 tablet (40 mg total) by mouth daily. 90 tablet 1  . Probiotic Product (PROBIOTIC DAILY PO) Take 1 capsule by mouth daily.    . vitamin C (ASCORBIC ACID) 500 MG tablet Take 500 mg by mouth daily.    . VOLTAREN 1 % GEL Apply 1 application topically daily as needed.     No current facility-administered medications on file prior to visit.     BP 115/61   Pulse 81   Temp 98.2 F (36.8 C) (Oral)   Ht 5\' 4"  (1.626 m)   Wt 157 lb (71.2 kg)   LMP 05/14/1996   SpO2 98%   BMI 26.95 kg/m    Objective:   Physical Exam  Constitutional: She is oriented to person, place, and time. She appears well-developed and well-nourished.  HENT:  Head: Normocephalic and atraumatic.  Right Ear: Tympanic membrane and ear canal normal.  Left Ear: Tympanic membrane and ear canal normal.  Nose: Right sinus exhibits no maxillary sinus tenderness and no frontal sinus tenderness. Left sinus exhibits no maxillary sinus tenderness and no frontal sinus tenderness.   Cardiovascular: Normal rate, regular rhythm and normal heart sounds.   No murmur heard. Pulmonary/Chest: Effort normal and breath sounds normal. No respiratory distress. She has no wheezes.  Neurological: She is alert and oriented to person, place, and time.  Psychiatric: She has a normal mood and affect. Her behavior is normal. Judgment and thought content normal.          Assessment & Plan:  Sinusitis-both ears look good. I suspect she has a right maxillary sinusitis. Will treat with doxycycline. I offered to add some prednisone to help with the inflammation in the sinus area and she declines at this time. Advised patient if new or worsening symptoms or symptoms are not improved in one week we will consider either further imaging of the sinus or referral to ENT. Patient verbalizes understanding. Flu shot today.

## 2017-03-18 NOTE — Telephone Encounter (Signed)
Patient scheduled with NP today at 3pm

## 2017-03-21 ENCOUNTER — Ambulatory Visit (HOSPITAL_COMMUNITY)
Admission: RE | Admit: 2017-03-21 | Discharge: 2017-03-21 | Disposition: A | Discharge status: Home / Self Care | Payer: Medicare Other | Source: Ambulatory Visit | Attending: Gastroenterology | Admitting: Gastroenterology

## 2017-03-21 DIAGNOSIS — R1310 Dysphagia, unspecified: Secondary | ICD-10-CM | POA: Insufficient documentation

## 2017-03-21 DIAGNOSIS — K219 Gastro-esophageal reflux disease without esophagitis: Secondary | ICD-10-CM | POA: Diagnosis not present

## 2017-03-21 DIAGNOSIS — K449 Diaphragmatic hernia without obstruction or gangrene: Secondary | ICD-10-CM | POA: Insufficient documentation

## 2017-03-21 DIAGNOSIS — K228 Other specified diseases of esophagus: Secondary | ICD-10-CM | POA: Insufficient documentation

## 2017-03-22 ENCOUNTER — Ambulatory Visit (INDEPENDENT_AMBULATORY_CARE_PROVIDER_SITE_OTHER): Payer: Medicare Other | Admitting: Psychology

## 2017-03-22 DIAGNOSIS — F331 Major depressive disorder, recurrent, moderate: Secondary | ICD-10-CM | POA: Diagnosis not present

## 2017-03-23 ENCOUNTER — Ambulatory Visit: Payer: Self-pay | Admitting: Family

## 2017-03-24 DIAGNOSIS — L821 Other seborrheic keratosis: Secondary | ICD-10-CM | POA: Diagnosis not present

## 2017-03-24 DIAGNOSIS — L218 Other seborrheic dermatitis: Secondary | ICD-10-CM | POA: Diagnosis not present

## 2017-03-24 DIAGNOSIS — L570 Actinic keratosis: Secondary | ICD-10-CM | POA: Diagnosis not present

## 2017-03-25 ENCOUNTER — Encounter: Payer: Self-pay | Admitting: Internal Medicine

## 2017-03-25 ENCOUNTER — Ambulatory Visit: Payer: Medicare Other | Admitting: *Deleted

## 2017-04-01 ENCOUNTER — Encounter: Payer: Self-pay | Admitting: Family

## 2017-04-01 NOTE — Progress Notes (Signed)
Subjective:   Vanessa Oliver is a 66 y.o. female who presents for Medicare Annual (Subsequent) preventive examination.  Review of Systems:  No ROS.  Medicare Wellness Visit. Additional risk factors are reflected in the social history.  Cardiac Risk Factors include: advanced age (>34men, >79 women);dyslipidemia Sleep patterns:  Wears CPAP. Sleeps about 8 hrs.  Home Safety/Smoke Alarms: Feels safe in home. Smoke alarms in place.  Living environment; residence and Adult nurse: lives with husband and mother. Seat Belt Safety/Bike Helmet: Wears seat belt.   Female:   Pap- hysterectomy     Mammo- 04/22/16: BI-RADS CATEGORY  1: Negative.      Dexa scan-  ORDERED TODAY      CCS-  reported per pt due 02/25/18     Objective:     Vitals: BP 113/62 (BP Location: Left Arm, Patient Position: Sitting, Cuff Size: Normal)   Pulse 79   Ht 5\' 4"  (1.626 m)   Wt 160 lb (72.6 kg)   LMP 05/14/1996   SpO2 99%   BMI 27.46 kg/m   Body mass index is 27.46 kg/m.   Tobacco History  Smoking Status  . Never Smoker  Smokeless Tobacco  . Never Used     Counseling given: Not Answered   Past Medical History:  Diagnosis Date  . Allergy    takes Singulair at night  . Anxiety   . Arthritis   . Chronic back pain    stenosis  . Depression    takes CYmbalta daily  . Esophageal stricture   . Family history of adverse reaction to anesthesia    oldest brother had trouble with anesthesia a long time ago but can't recall what  . Fibromyalgia   . GERD (gastroesophageal reflux disease)    takes Omeprazole daily  . Hemorrhoids   . History of bronchitis   . Hyperlipidemia   . IBS (irritable bowel syndrome)   . Joint pain   . Joint swelling   . Plantar fasciitis   . Rosacea   . Sleep apnea    cpap- settings at 10 breathing   . TMJ (temporomandibular joint disorder)   . Vasovagal episode back in the 80's  . Vasovagal syncope   . Weakness    right leg   Past Surgical History:    Procedure Laterality Date  . ABDOMINAL HYSTERECTOMY  1997  . CARPAL TUNNEL RELEASE Bilateral 2004  . CARPOMETACARPAL (CMC) FUSION OF THUMB  2015   thumb  . CATARACT EXTRACTION Right 2013   Dr Ellison Hughs  . CATARACT EXTRACTION Left 04/2016  . COLONOSCOPY    . COLONOSCOPY WITH ESOPHAGOGASTRODUODENOSCOPY (EGD) AND ESOPHAGEAL DILATION (ED)    . EYE SURGERY     scar tissue removed after cataract surgery  . 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 KNEE ARTHROSCOPY Right 1994   x 3  . KNEE SURGERY Right 01/21/2006  . LUMBAR LAMINECTOMY/DECOMPRESSION MICRODISCECTOMY Right 02/11/2015   Procedure: Laminectomy and Foraminotomy - Right - L3-L4;  Surgeon: Earnie Larsson, MD;  Location: [facility];  Service: Neurosurgery;  Laterality: Right;  Laminectomy and Foraminotomy - Right - L3-L4  . mallet finger Right 2011  . NASAL SEPTUM SURGERY  1970  . NISSEN FUNDOPLICATION  8786  . RETINAL LASER PROCEDURE Right 02/01/2011   right eye   . TONSILLECTOMY  1981  . TOTAL KNEE ARTHROPLASTY Right 09/06/2016   Procedure: RIGHT TOTAL KNEE ARTHROPLASTY;  Surgeon: Gaynelle Arabian, MD;  Location: [facility];  Service: Orthopedics;  Laterality: Right;  . TRIGGER FINGER RELEASE Bilateral 2005  . VARICOSE VEIN SURGERY Bilateral 2007, 2009   left 2007, right 2009  . VITRECTOMY Right 2012   Family History  Problem Relation Age of Onset  . Breast cancer Mother   . Arthritis Mother   . Hyperlipidemia Mother   . Hypertension Mother   . Lung cancer Father   . Pancreatic cancer Father   . Skin cancer Father   . Arthritis Father   . Hyperlipidemia Maternal Grandfather   . Heart disease Maternal Grandfather   . Stroke Maternal Grandfather   . Celiac disease Brother    History  Sexual Activity  . Sexual activity: No    Outpatient Encounter Prescriptions as of 04/05/2017  Medication Sig  . aspirin 81 MG tablet Take 81 mg by mouth daily.  . Azelaic Acid (FINACEA) 15 % cream Apply small amount to clean dry skin  twice daily.  Marland Kitchen azithromycin (ZITHROMAX) 250 MG tablet 2 tabs by mouth now, then one tab once daily for 4 more days.  . benzoyl peroxide 5 % gel Apply 1 application topically 2 (two) times daily.   Marland Kitchen CALCIUM CITRATE PO Take 1 tablet by mouth daily.  . cholecalciferol (VITAMIN D) 1000 units tablet Take 1,000 Units by mouth 2 (two) times daily.  . DULoxetine (CYMBALTA) 60 MG capsule Take 1 capsule (60 mg total) by mouth daily.  Marland Kitchen EPINEPHrine (EPIPEN 2-PAK) 0.3 mg/0.3 mL IJ SOAJ injection Inject 0.3 mLs (0.3 mg total) into the muscle once.  Marland Kitchen FIBER PO Take 2 capsules by mouth 2 (two) times daily.  . fluticasone (FLONASE) 50 MCG/ACT nasal spray Place 1 spray into both nostrils at bedtime.  Marland Kitchen loratadine (CLARITIN) 10 MG tablet Take 10 mg by mouth daily as needed for allergies.  . Multiple Vitamin (MULTIVITAMIN) tablet Take 1 tablet by mouth daily.  . pantoprazole (PROTONIX) 40 MG tablet Take 1 tablet (40 mg total) by mouth daily. 30 minutes before breakfast or dinner.  . Polyethylene Glycol 400 (BLINK TEARS) 0.25 % GEL Apply 1 application to eye daily as needed (dry eyes).  . Probiotic Product (PROBIOTIC DAILY PO) Take 1 capsule by mouth daily.  . vitamin C (ASCORBIC ACID) 500 MG tablet Take 500 mg by mouth daily.  . VOLTAREN 1 % GEL Apply 1 application topically daily as needed.  . pravastatin (PRAVACHOL) 40 MG tablet Take 1 tablet (40 mg total) by mouth daily. (Patient not taking: Reported on 04/05/2017)  . [DISCONTINUED] benzonatate (TESSALON) 100 MG capsule Take 1 capsule (100 mg total) by mouth 3 (three) times daily as needed for cough. (Patient not taking: Reported on 04/04/2017)  . [DISCONTINUED] diphenhydrAMINE (SOMINEX) 25 MG tablet Take 25 mg by mouth at bedtime as needed for sleep.  . [DISCONTINUED] doxycycline (VIBRA-TABS) 100 MG tablet Take 1 tablet (100 mg total) by mouth 2 (two) times daily. (Patient not taking: Reported on 04/04/2017)   No facility-administered encounter medications on  file as of 04/05/2017.     Activities of Daily Living In your present state of health, do you have any difficulty performing the following activities: 04/05/2017 09/06/2016  Hearing? N N  Vision? N Y  Comment Wears glasses. Dr.Jones yearly. -  Difficulty concentrating or making decisions? N N  Walking or climbing stairs? N Y  Dressing or bathing? N N  Doing errands, shopping? N N  Preparing Food and eating ? N -  Using the Toilet? N -  In the past  six months, have you accidently leaked urine? N -  Do you have problems with loss of bowel control? N -  Managing your Medications? N -  Managing your Finances? N -  Housekeeping or managing your Housekeeping? N -  Some recent data might be hidden    Patient Care Team: Debbrah Alar, NP as PCP - General (Internal Medicine) Gaynelle Arabian, MD as Consulting Physician (Orthopedic Surgery) Earnie Larsson, MD as Consulting Physician (Neurosurgery) Laurence Aly, [place] as Consulting Physician (Optometry) Wylene Simmer, MD as Consulting Physician (Orthopedic Surgery) Susa Day, MD as Consulting Physician (Orthopedic Surgery) Rigoberto Noel, MD as Consulting Physician (Pulmonary Disease)    Assessment:    Physical assessment deferred to PCP.  Exercise Activities and Dietary recommendations Current Exercise Habits: Home exercise routine, Type of exercise: walking, Time (Minutes): 30, Frequency (Times/Week): 7, Weekly Exercise (Minutes/Week): 210, Intensity: Mild   Diet (meal preparation, eat out, water intake, caffeinated beverages, dairy products, fruits and vegetables): well balanced    Goals    . Healthy lifestyle    . Increase physical activity      Fall Risk Fall Risk  04/05/2017 08/09/2016 05/18/2016 05/18/2016 03/19/2016  Falls in the past year? No No No No Yes  Number falls in past yr: - - - - 1  Injury with Fall? - - - - No   Depression Screen PHQ 2/9 Scores 04/05/2017 03/18/2017 12/10/2016 08/09/2016  PHQ - 2 Score 0 0 2 1  PHQ- 9  Score - 0 3 -     Cognitive Function Ad8 score reviewed for issues:  Issues making decisions:no  Less interest in hobbies / activities:no  Repeats questions, stories (family complaining):no  Trouble using ordinary gadgets (microwave, computer, phone):no  Forgets the month or year: no  Mismanaging finances: no  Remembering appts:no  Daily problems with thinking and/or memory:no Ad8 score is=0  MMSE - Mini Mental State Exam 03/19/2016  Orientation to time 5  Orientation to Place 5  Registration 3  Attention/ Calculation 5  Recall 3  Language- name 2 objects 2  Language- repeat 1  Language- follow 3 step command 3  Language- read & follow direction 1  Write a sentence 1  Copy design 1  Total score 30        Immunization History  Administered Date(s) Administered  . Influenza, High Dose Seasonal PF 03/19/2016, 03/18/2017  . Influenza,inj,Quad PF,6+ Mos 03/14/2015  . Influenza-Unspecified 03/23/2014  . Pneumococcal Conjugate-13 09/23/2014  . Td 02/16/2011  . Zoster 08/08/2009   Screening Tests Health Maintenance  Topic Date Due  . COLONOSCOPY  02/25/2018  . MAMMOGRAM  04/22/2018  . TETANUS/TDAP  02/15/2021  . INFLUENZA VACCINE  Completed  . DEXA SCAN  Completed  . Hepatitis C Screening  Completed      Plan:   Follow up with Debbrah Alar, NP as scheduled  Continue to eat heart healthy diet (full of fruits, vegetables, whole grains, lean protein, water--limit salt, fat, and sugar intake) and increase physical activity as tolerated.  Continue doing brain stimulating activities (puzzles, reading, adult coloring books, staying active) to keep memory sharp.   Bring a copy of your living will and/or healthcare power of attorney to your next office visit.    I have personally reviewed and noted the following in the patient's chart:   . Medical and social history . Use of alcohol, tobacco or illicit drugs  . Current medications and  supplements . Functional ability and status . Nutritional status . Physical  activity . Advanced directives . List of other physicians . Hospitalizations, surgeries, and ER visits in previous 12 months . Vitals . Screenings to include cognitive, depression, and falls . Referrals and appointments  In addition, I have reviewed and discussed with patient certain preventive protocols, quality metrics, and best practice recommendations. A written personalized care plan for preventive services as well as general preventive health recommendations were provided to patient.     Naaman Plummer [place], [place]  04/05/2017

## 2017-04-04 ENCOUNTER — Ambulatory Visit (INDEPENDENT_AMBULATORY_CARE_PROVIDER_SITE_OTHER): Payer: Medicare Other | Admitting: Family

## 2017-04-04 ENCOUNTER — Ambulatory Visit (INDEPENDENT_AMBULATORY_CARE_PROVIDER_SITE_OTHER): Payer: Medicare Other | Admitting: Internal Medicine

## 2017-04-04 ENCOUNTER — Encounter: Payer: Self-pay | Admitting: Internal Medicine

## 2017-04-04 ENCOUNTER — Encounter: Payer: Self-pay | Admitting: Family

## 2017-04-04 VITALS — BP 127/68 | HR 79 | Temp 98.1°F | Resp 18 | Ht 64.0 in | Wt 158.6 lb

## 2017-04-04 VITALS — BP 120/76 | HR 80 | Ht 64.0 in | Wt 160.2 lb

## 2017-04-04 DIAGNOSIS — J029 Acute pharyngitis, unspecified: Secondary | ICD-10-CM

## 2017-04-04 DIAGNOSIS — K219 Gastro-esophageal reflux disease without esophagitis: Secondary | ICD-10-CM

## 2017-04-04 DIAGNOSIS — R1310 Dysphagia, unspecified: Secondary | ICD-10-CM | POA: Diagnosis not present

## 2017-04-04 DIAGNOSIS — H6691 Otitis media, unspecified, right ear: Secondary | ICD-10-CM | POA: Diagnosis not present

## 2017-04-04 LAB — POCT RAPID STREP A (OFFICE): Rapid Strep A Screen: NEGATIVE

## 2017-04-04 MED ORDER — AZITHROMYCIN 250 MG PO TABS: ORAL_TABLET | ORAL | 0 refills | Status: DC

## 2017-04-04 NOTE — Progress Notes (Signed)
Subjective:    Patient ID: Vanessa Oliver, female    DOB: June 05, 1951, 66 y.o.   MRN: [id number]  HPI  Vanessa Oliver is a 66 yr old female who presents today with chief complaint of sore throat.  Sore throat has been present x 1 week.  Reports that symptoms started on the left then moved to the right.  Throat feels swollen.  Vanessa Oliver also reports a 3 day history of right sided HA. Reports + cough.  Vanessa Oliver denies fever.  + fatigue.    Review of Systems    see HPI  Past Medical History:  Diagnosis Date  . Allergy    takes Singulair at night  . Anxiety   . Arthritis   . Chronic back pain    stenosis  . Depression    takes CYmbalta daily  . Esophageal stricture   . Family history of adverse reaction to anesthesia    oldest brother had trouble with anesthesia a long time ago but can't recall what  . Fibromyalgia   . GERD (gastroesophageal reflux disease)    takes Omeprazole daily  . Hemorrhoids   . History of bronchitis   . Hyperlipidemia   . IBS (irritable bowel syndrome)   . Joint pain   . Joint swelling   . Plantar fasciitis   . Rosacea   . Sleep apnea    cpap- settings at 10 breathing   . TMJ (temporomandibular joint disorder)   . Vasovagal episode back in the 80's  . Vasovagal syncope   . Weakness    right leg     Social History   Social History  . Marital status: Married    Spouse name: N/A  . Number of children: 0  . Years of education: N/A   Occupational History  . 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 Smoking status: Never Smoker  . Smokeless tobacco: Never Used  . Alcohol use 0.0 oz/week     Comment: rarely  . Drug use: No  . Sexual activity: Yes    Birth control/ protection: Surgical   Other Topics Concern  . Not on file   Social History Narrative   Works as an Glass blower/designer   Married- husband is 65 years older than her   Former Dance movement psychotherapist up up [employer]   Has cats   Enjoys reading   No children    Past Surgical History:    Procedure Laterality Date  . ABDOMINAL HYSTERECTOMY  1997  . CARPAL TUNNEL RELEASE Bilateral 2004  . CARPOMETACARPAL (CMC) FUSION OF THUMB  2015   thumb  . CATARACT EXTRACTION Right 2013   Dr Ellison Hughs  . CATARACT EXTRACTION Left 04/2016  . COLONOSCOPY    . COLONOSCOPY WITH ESOPHAGOGASTRODUODENOSCOPY (EGD) AND ESOPHAGEAL DILATION (ED)    . EYE SURGERY     scar tissue removed after cataract surgery  . 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 KNEE ARTHROSCOPY Right 1994   x 3  . KNEE SURGERY Right 01/21/2006  . LUMBAR LAMINECTOMY/DECOMPRESSION MICRODISCECTOMY Right 02/11/2015   Procedure: Laminectomy and Foraminotomy - Right - L3-L4;  Surgeon: Earnie Larsson, MD;  Location: [facility];  Service: Neurosurgery;  Laterality: Right;  Laminectomy and Foraminotomy - Right - L3-L4  . mallet finger Right 2011  . NASAL SEPTUM SURGERY  1970  . NISSEN FUNDOPLICATION  7846  . RETINAL LASER PROCEDURE Right 02/01/2011   right eye   .  TONSILLECTOMY  1981  . TOTAL KNEE ARTHROPLASTY Right 09/06/2016   Procedure: RIGHT TOTAL KNEE ARTHROPLASTY;  Surgeon: Gaynelle Arabian, MD;  Location: [facility];  Service: Orthopedics;  Laterality: Right;  . TRIGGER FINGER RELEASE Bilateral 2005  . VARICOSE VEIN SURGERY Bilateral 2007, 2009   left 2007, right 2009  . VITRECTOMY Right 2012    Family History  Problem Relation Age of Onset  . Breast cancer Mother   . Arthritis Mother   . Hyperlipidemia Mother   . Hypertension Mother   . Lung cancer Father   . Pancreatic cancer Father   . Skin cancer Father   . Arthritis Father   . Hyperlipidemia Maternal Grandfather   . Heart disease Maternal Grandfather   . Stroke Maternal Grandfather   . Celiac disease Brother     Allergies  Allergen Reactions  . Aleve [Naproxen Sodium]     Swelling, itching  . Cefuroxime Axetil     Hives / "throat swelling"  . Chlorzoxazone Rash and Other (See Comments)    "breathing problems"   . Oxycodone     Hallucinations, rash   . Penicillins Anaphylaxis and Hives    Has patient had a PCN reaction causing immediate rash, facial/tongue/throat swelling, SOB or lightheadedness with hypotension: Yes Has patient had a PCN reaction causing severe rash involving mucus membranes or skin necrosis: No Has patient had a PCN reaction that required hospitalization No Has patient had a PCN reaction occurring within the last 10 years: No If all of the above answers are "NO", then may proceed with Cephalosporin use.   . Adhesive [Tape]     rash  . Cataflam [Diclofenac Potassium]     Lip swelling  . Clarithromycin Diarrhea  . Flagyl [Metronidazole Hcl]     ?diarrhea  . Glucosamine Hives  . Guaifenesin & Derivatives     Stomach upset  . Hydrocodone-Acetaminophen     REACTION: rash, tightening of throat  . Ketorolac Tromethamine     ?reaction  . Pentazocine     Involuntary twitching  . Pneumovax [Pneumococcal Polysaccharide Vaccine] Other (See Comments)    Redness/swelling at site, nausea  . Prevnar [Pneumococcal 13-Val Conj Vacc] Other (See Comments)    Swelling, redness, nausea  . Smz-Tmp Ds [Sulfamethoxazole W/Trimethoprim (Co-Trimoxazole)]     ?unknown reaction  . Tramadol     constipation  . Trazodone And Nefazodone Other (See Comments)    Scratchy throat / congestion  . Latex Rash    Current Outpatient Prescriptions on File Prior to Visit  Medication Sig Dispense Refill  . aspirin 81 MG tablet Take 81 mg by mouth daily.    . Azelaic Acid (FINACEA) 15 % cream Apply small amount to clean dry skin twice daily. 90 g 3  . benzoyl peroxide 5 % gel Apply 1 application topically 2 (two) times daily.     Marland Kitchen CALCIUM CITRATE PO Take 1 tablet by mouth daily.    . cholecalciferol (VITAMIN D) 1000 units tablet Take 1,000 Units by mouth 2 (two) times daily.    . DULoxetine (CYMBALTA) 60 MG capsule Take 1 capsule (60 mg total) by mouth daily. 90 capsule 1  . EPINEPHrine (EPIPEN 2-PAK) 0.3 mg/0.3 mL IJ SOAJ injection Inject  0.3 mLs (0.3 mg total) into the muscle once. 1 Device 1  . FIBER PO Take 2 capsules by mouth 2 (two) times daily.    . fluticasone (FLONASE) 50 MCG/ACT nasal spray Place 1 spray into both nostrils at bedtime.    Marland Kitchen  loratadine (CLARITIN) 10 MG tablet Take 10 mg by mouth daily as needed for allergies.    . Multiple Vitamin (MULTIVITAMIN) tablet Take 1 tablet by mouth daily.    . pantoprazole (PROTONIX) 40 MG tablet Take 1 tablet (40 mg total) by mouth daily. 30 minutes before breakfast or dinner. 30 tablet 5  . Polyethylene Glycol 400 (BLINK TEARS) 0.25 % GEL Apply 1 application to eye daily as needed (dry eyes).    . pravastatin (PRAVACHOL) 40 MG tablet Take 1 tablet (40 mg total) by mouth daily. 90 tablet 1  . Probiotic Product (PROBIOTIC DAILY PO) Take 1 capsule by mouth daily.    . vitamin C (ASCORBIC ACID) 500 MG tablet Take 500 mg by mouth daily.    . VOLTAREN 1 % GEL Apply 1 application topically daily as needed.     No current facility-administered medications on file prior to visit.     BP 127/68 (BP Location: Right Arm, Cuff Size: Normal)   Pulse 79   Temp 98.1 F (36.7 C) (Oral)   Resp 18   Ht 5\' 4"  (1.626 m)   Wt 158 lb 9.6 oz (71.9 kg)   LMP 05/14/1996   SpO2 100%   BMI 27.22 kg/m    Objective:   Physical Exam  Constitutional: Vanessa Oliver appears well-developed and well-nourished.  HENT:  Mouth/Throat: Oropharynx is clear and moist. No oropharyngeal exudate, posterior oropharyngeal edema or posterior oropharyngeal erythema.  R TM is pink, mild bulging, L TM is norma  Eyes: No scleral icterus.  Cardiovascular: Normal rate, regular rhythm and normal heart sounds.   No murmur heard. Pulmonary/Chest: Effort normal and breath sounds normal. No respiratory distress. Vanessa Oliver has no wheezes.  Lymphadenopathy:    Vanessa Oliver has no cervical adenopathy.  Psychiatric: Vanessa Oliver has a normal mood and affect. Her behavior is normal. Judgment and thought content normal.          Assessment & Plan:    R OM- will rx with zpak (reports Vanessa Oliver has tolerated in the past). Vanessa Oliver has multiple allergies/intolerances.  Vanessa Oliver is advised to call if new/worsening symptoms or if not imroved in 3-4 days. Rapid strep is negative. Advised pt to continue her probiotic.

## 2017-04-04 NOTE — Progress Notes (Signed)
HISTORY OF PRESENT ILLNESS:  Vanessa Oliver is a 66 y.o. female with past medical history as listed below. She has a history of GERD status post Nissen fundoplication 2706. Last seen by me September 2015. Mild pill dysphagia that time. Recently seen by the GI physician assistant for somewhat worsening pill dysphagia. No difficulties with foods. She did undergo barium esophagram 2 weeks ago. This revealed small residual gastric hiatal hernia, spontaneous reflux, presbyesophagus with tertiary contractions, and mild delay of passage of barium tablet from the distal esophagus to the stomach. She underwent upper endoscopy with esophageal dilation elsewhere approximately a year ago. It sounds like small caliber dilator based on her description. She continues to require PPI for control of classic GERD symptoms. Review of blood work from earlier this year including CBC and comp [place] metabolic panel were unremarkable. Current PPIs pantoprazole 40 mg daily. Her last colonoscopy was performed August 2009. The examination was normal except for sigmoid diverticulosis. Follow-up in 10 years recommended. No interval change in her family history.  REVIEW OF SYSTEMS:  All non-GI ROS negative except for upper respiratory illness  Past Medical History:  Diagnosis Date  . Allergy    takes Singulair at night  . Anxiety   . Arthritis   . Chronic back pain    stenosis  . Depression    takes CYmbalta daily  . Esophageal stricture   . Family history of adverse reaction to anesthesia    oldest brother had trouble with anesthesia a long time ago but can't recall what  . Fibromyalgia   . GERD (gastroesophageal reflux disease)    takes Omeprazole daily  . Hemorrhoids   . History of bronchitis   . Hyperlipidemia   . IBS (irritable bowel syndrome)   . Joint pain   . Joint swelling   . Plantar fasciitis   . Rosacea   . Sleep apnea    cpap- settings at 8 breathing   . TMJ (temporomandibular joint disorder)    . Vasovagal episode back in the 80's  . Vasovagal syncope   . Weakness    right leg    Past Surgical History:  Procedure Laterality Date  . ABDOMINAL HYSTERECTOMY  1997  . CARPAL TUNNEL RELEASE Bilateral 2004  . CARPOMETACARPAL (CMC) FUSION OF THUMB  2015   thumb  . CATARACT EXTRACTION Right 2013   Dr Ellison Hughs  . CATARACT EXTRACTION Left 04/2016  . COLONOSCOPY    . COLONOSCOPY WITH ESOPHAGOGASTRODUODENOSCOPY (EGD) AND ESOPHAGEAL DILATION (ED)    . EYE SURGERY     scar tissue removed after cataract surgery  . 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 KNEE ARTHROSCOPY Right 1994   x 3  . KNEE SURGERY Right 01/21/2006  . LUMBAR LAMINECTOMY/DECOMPRESSION MICRODISCECTOMY Right 02/11/2015   Procedure: Laminectomy and Foraminotomy - Right - L3-L4;  Surgeon: Earnie Larsson, MD;  Location: [facility];  Service: Neurosurgery;  Laterality: Right;  Laminectomy and Foraminotomy - Right - L3-L4  . mallet finger Right 2011  . NASAL SEPTUM SURGERY  1970  . NISSEN FUNDOPLICATION  2376  . RETINAL LASER PROCEDURE Right 02/01/2011   right eye   . TONSILLECTOMY  1981  . TOTAL KNEE ARTHROPLASTY Right 09/06/2016   Procedure: RIGHT TOTAL KNEE ARTHROPLASTY;  Surgeon: Gaynelle Arabian, MD;  Location: [facility];  Service: Orthopedics;  Laterality: Right;  . TRIGGER FINGER RELEASE Bilateral 2005  . VARICOSE VEIN SURGERY Bilateral 2007, 2009   left 2007, right 2009  .  VITRECTOMY Right 2012    Social History Vanessa Oliver  reports that she has never smoked. She has never used smokeless tobacco. She reports that she drinks alcohol. She reports that she does not use drugs.  family history includes Arthritis in her father and mother; Breast cancer in her mother; Celiac disease in her brother; Heart disease in her maternal grandfather; Hyperlipidemia in her maternal grandfather and mother; Hypertension in her mother; Lung cancer in her father; Pancreatic cancer in her father; Skin cancer in her father; Stroke  in her maternal grandfather.  Allergies  Allergen Reactions  . Aleve [Naproxen Sodium]     Swelling, itching  . Cefuroxime Axetil     Hives / "throat swelling"  . Chlorzoxazone Rash and Other (See Comments)    "breathing problems"   . Oxycodone     Hallucinations, rash  . Penicillins Anaphylaxis and Hives    Has patient had a PCN reaction causing immediate rash, facial/tongue/throat swelling, SOB or lightheadedness with hypotension: Yes Has patient had a PCN reaction causing severe rash involving mucus membranes or skin necrosis: No Has patient had a PCN reaction that required hospitalization No Has patient had a PCN reaction occurring within the last 10 years: No If all of the above answers are "NO", then may proceed with Cephalosporin use.   . Adhesive [Tape]     rash  . Cataflam [Diclofenac Potassium]     Lip swelling  . Clarithromycin Diarrhea  . Flagyl [Metronidazole Hcl]     ?diarrhea  . Glucosamine Hives  . Guaifenesin & Derivatives     Stomach upset  . Hydrocodone-Acetaminophen     REACTION: rash, tightening of throat  . Ketorolac Tromethamine     ?reaction  . Pentazocine     Involuntary twitching  . Pneumovax [Pneumococcal Polysaccharide Vaccine] Other (See Comments)    Redness/swelling at site, nausea  . Prevnar [Pneumococcal 13-Val Conj Vacc] Other (See Comments)    Swelling, redness, nausea  . Smz-Tmp Ds [Sulfamethoxazole W/Trimethoprim (Co-Trimoxazole)]     ?unknown reaction  . Tramadol     constipation  . Trazodone And Nefazodone Other (See Comments)    Scratchy throat / congestion  . Latex Rash       PHYSICAL EXAMINATION: Vital signs: BP 120/76   Pulse 80   Ht 5\' 4"  (1.626 m)   Wt 160 lb 3.2 oz (72.7 kg)   LMP 05/14/1996   SpO2 98%   BMI 27.50 kg/m   Constitutional: generally well-appearing, no acute distress Psychiatric: alert and oriented x3, cooperative Eyes: extraocular movements intact, anicteric, conjunctiva pink Mouth: oral pharynx  moist, no lesions Neck: supple no lymphadenopathy Cardiovascular: heart regular rate and rhythm, no murmur Lungs: clear to auscultation bilaterally Abdomen: soft, nontender, nondistended, no obvious ascites, no peritoneal signs, normal bowel sounds, no organomegaly Rectal:Omitted Extremities: no clubbing, cyanosis, or lower extremity edema bilaterally Skin: no lesions on visible extremities Neuro: No focal deficits. Cranial nerves intact  ASSESSMENT:  #1. Pill dysphagia. Slightly worse. Based on the esophagram she may have some narrowing at the esophageal junction. Also may have some mild dysmotility. I offered the patient repeat endoscopy would large-caliber esophageal dilation. She wishes to hold off #2. Colon cancer screening. Up-to-date. Due for colonoscopy next August #3. GERD. Status post Nissen fundoplication. Recurrent GERD symptoms post-fundoplication requiring PPI.   PLAN:  #1. Reflux precautions #2. Continue PPI. Lowest dose to control symptoms recommended #3. Contact the office if dysphagia worsens and she wishes to proceed with endoscopy  and esophageal dilation #4. Due for repeat screening colonoscopy next year. She is aware  15 minutes spent face-to-face with the patient. Greater than 50% a time use for counseling regarding her chronic GERD and issues with pill dysphagia

## 2017-04-04 NOTE — Patient Instructions (Signed)
Please follow up in one year 

## 2017-04-04 NOTE — Patient Instructions (Signed)
Begin azithromycin for your right sided ear infection.  Call if new/worsening symptoms or if not improved in 3-4 days.

## 2017-04-05 ENCOUNTER — Ambulatory Visit (INDEPENDENT_AMBULATORY_CARE_PROVIDER_SITE_OTHER): Payer: Medicare Other | Admitting: *Deleted

## 2017-04-05 ENCOUNTER — Other Ambulatory Visit: Payer: Self-pay | Admitting: Family

## 2017-04-05 ENCOUNTER — Encounter: Payer: Self-pay | Admitting: *Deleted

## 2017-04-05 VITALS — BP 113/62 | HR 79 | Ht 64.0 in | Wt 160.0 lb

## 2017-04-05 DIAGNOSIS — Z780 Asymptomatic menopausal state: Secondary | ICD-10-CM | POA: Diagnosis not present

## 2017-04-05 DIAGNOSIS — Z0000 Encounter for general adult medical examination without abnormal findings: Secondary | ICD-10-CM

## 2017-04-05 DIAGNOSIS — Z1231 Encounter for screening mammogram for malignant neoplasm of breast: Secondary | ICD-10-CM

## 2017-04-05 NOTE — Patient Instructions (Signed)
Vanessa Oliver , Thank you for taking time to come for your Medicare Wellness Visit. I appreciate your ongoing commitment to your health goals. Please review the following plan we discussed and let me know if I can assist you in the future.   These are the goals we discussed: Goals    . Healthy lifestyle    . Increase physical activity       This is a list of the screening recommended for you and due dates:  Health Maintenance  Topic Date Due  . Colon Cancer Screening  02/25/2018  . Mammogram  04/22/2018  . Tetanus Vaccine  02/15/2021  . Flu Shot  Completed  . DEXA scan (bone density measurement)  Completed  .  Hepatitis C: One time screening is recommended by Center for Disease Control  (CDC) for  adults born from [birth year] through 1967.   Completed    Follow up with Debbrah Alar, NP as scheduled  Continue to eat heart healthy diet (full of fruits, vegetables, whole grains, lean protein, water--limit salt, fat, and sugar intake) and increase physical activity as tolerated.  Continue doing brain stimulating activities (puzzles, reading, adult coloring books, staying active) to keep memory sharp.   Bring a copy of your living will and/or healthcare power of attorney to your next office visit.   Health Maintenance for Postmenopausal Women Menopause is a normal process in which your reproductive ability comes to an end. This process happens gradually over a span of months to years, usually between the ages of 98 and 34. Menopause is complete when you have missed 12 consecutive menstrual periods. It is important to talk with your health care provider about some of the most common conditions that affect postmenopausal women, such as heart disease, cancer, and bone loss (osteoporosis). Adopting a healthy lifestyle and getting preventive care can help to promote your health and wellness. Those actions can also lower your chances of developing some of these common conditions. What should I know  about menopause? During menopause, you may experience a number of symptoms, such as:  Moderate-to-severe hot flashes.  Night sweats.  Decrease in sex drive.  Mood swings.  Headaches.  Tiredness.  Irritability.  Memory problems.  Insomnia.  Choosing to treat or not to treat menopausal changes is an individual decision that you make with your health care provider. What should I know about hormone replacement therapy and supplements? Hormone therapy products are effective for treating symptoms that are associated with menopause, such as hot flashes and night sweats. Hormone replacement carries certain risks, especially as you become older. If you are thinking about using estrogen or estrogen with progestin treatments, discuss the benefits and risks with your health care provider. What should I know about heart disease and stroke? Heart disease, heart attack, and stroke become more likely as you age. This may be due, in part, to the hormonal changes that your body experiences during menopause. These can affect how your body processes dietary fats, triglycerides, and cholesterol. Heart attack and stroke are both medical emergencies. There are many things that you can do to help prevent heart disease and stroke:  Have your blood pressure checked at least every 1-2 years. High blood pressure causes heart disease and increases the risk of stroke.  If you are 84-58 years old, ask your health care provider if you should take aspirin to prevent a heart attack or a stroke.  Do not use any tobacco products, including cigarettes, chewing tobacco, or electronic cigarettes. If  you need help quitting, ask your health care provider.  It is important to eat a healthy diet and maintain a healthy weight. ? Be sure to include plenty of vegetables, fruits, low-fat dairy products, and lean protein. ? Avoid eating foods that are high in solid fats, added sugars, or salt (sodium).  Get regular exercise.  This is one of the most important things that you can do for your health. ? Try to exercise for at least 150 minutes each week. The type of exercise that you do should increase your heart rate and make you sweat. This is known as moderate-intensity exercise. ? Try to do strengthening exercises at least twice each week. Do these in addition to the moderate-intensity exercise.  Know your numbers.Ask your health care provider to check your cholesterol and your blood glucose. Continue to have your blood tested as directed by your health care provider.  What should I know about cancer screening? There are several types of cancer. Take the following steps to reduce your risk and to catch any cancer development as early as possible. Breast Cancer  Practice breast self-awareness. ? This means understanding how your breasts normally appear and feel. ? It also means doing regular breast self-exams. Let your health care provider know about any changes, no matter how small.  If you are 57 or older, have a clinician do a breast exam (clinical breast exam or CBE) every year. Depending on your age, family history, and medical history, it may be recommended that you also have a yearly breast X-ray (mammogram).  If you have a family history of breast cancer, talk with your health care provider about genetic screening.  If you are at high risk for breast cancer, talk with your health care provider about having an MRI and a mammogram every year.  Breast cancer (BRCA) gene test is recommended for women who have family members with BRCA-related cancers. Results of the assessment will determine the need for genetic counseling and BRCA1 and for BRCA2 testing. BRCA-related cancers include these types: ? Breast. This occurs in males or females. ? Ovarian. ? Tubal. This may also be called fallopian tube cancer. ? Cancer of the abdominal or pelvic lining (peritoneal cancer). ? Prostate. ? Pancreatic.  Cervical,  Uterine, and Ovarian Cancer Your health care provider may recommend that you be screened regularly for cancer of the pelvic organs. These include your ovaries, uterus, and vagina. This screening involves a pelvic exam, which includes checking for microscopic changes to the surface of your cervix (Pap test).  For women ages 21-65, health care providers may recommend a pelvic exam and a Pap test every three years. For women ages 49-65, they may recommend the Pap test and pelvic exam, combined with testing for human papilloma virus (HPV), every five years. Some types of HPV increase your risk of cervical cancer. Testing for HPV may also be done on women of any age who have unclear Pap test results.  Other health care providers may not recommend any screening for nonpregnant women who are considered low risk for pelvic cancer and have no symptoms. Ask your health care provider if a screening pelvic exam is right for you.  If you have had past treatment for cervical cancer or a condition that could lead to cancer, you need Pap tests and screening for cancer for at least 20 years after your treatment. If Pap tests have been discontinued for you, your risk factors (such as having a new sexual partner) need to  be reassessed to determine if you should start having screenings again. Some women have medical problems that increase the chance of getting cervical cancer. In these cases, your health care provider may recommend that you have screening and Pap tests more often.  If you have a family history of uterine cancer or ovarian cancer, talk with your health care provider about genetic screening.  If you have vaginal bleeding after reaching menopause, tell your health care provider.  There are currently no reliable tests available to screen for ovarian cancer.  Lung Cancer Lung cancer screening is recommended for adults 38-98 years old who are at high risk for lung cancer because of a history of smoking. A  yearly low-dose CT scan of the lungs is recommended if you:  Currently smoke.  Have a history of at least 30 pack-years of smoking and you currently smoke or have quit within the past 15 years. A pack-year is smoking an average of one pack of cigarettes per day for one year.  Yearly screening should:  Continue until it has been 15 years since you quit.  Stop if you develop a health problem that would prevent you from having lung cancer treatment.  Colorectal Cancer  This type of cancer can be detected and can often be prevented.  Routine colorectal cancer screening usually begins at age 76 and continues through age 59.  If you have risk factors for colon cancer, your health care provider may recommend that you be screened at an earlier age.  If you have a family history of colorectal cancer, talk with your health care provider about genetic screening.  Your health care provider may also recommend using home test kits to check for hidden blood in your stool.  A small camera at the end of a tube can be used to examine your colon directly (sigmoidoscopy or colonoscopy). This is done to check for the earliest forms of colorectal cancer.  Direct examination of the colon should be repeated every 5-10 years until age 39. However, if early forms of precancerous polyps or small growths are found or if you have a family history or genetic risk for colorectal cancer, you may need to be screened more often.  Skin Cancer  Check your skin from head to toe regularly.  Monitor any moles. Be sure to tell your health care provider: ? About any new moles or changes in moles, especially if there is a change in a mole's shape or color. ? If you have a mole that is larger than the size of a pencil eraser.  If any of your family members has a history of skin cancer, especially at a young age, talk with your health care provider about genetic screening.  Always use sunscreen. Apply sunscreen liberally  and repeatedly throughout the day.  Whenever you are outside, protect yourself by wearing long sleeves, pants, a wide-brimmed hat, and sunglasses.  What should I know about osteoporosis? Osteoporosis is a condition in which bone destruction happens more quickly than new bone creation. After menopause, you may be at an increased risk for osteoporosis. To help prevent osteoporosis or the bone fractures that can happen because of osteoporosis, the following is recommended:  If you are 56-89 years old, get at least 1,000 mg of calcium and at least 600 mg of vitamin D per day.  If you are older than age 50 but younger than age 27, get at least 1,200 mg of calcium and at least 600 mg of vitamin D  per day.  If you are older than age 65, get at least 1,200 mg of calcium and at least 800 mg of vitamin D per day.  Smoking and excessive alcohol intake increase the risk of osteoporosis. Eat foods that are rich in calcium and vitamin D, and do weight-bearing exercises several times each week as directed by your health care provider. What should I know about how menopause affects my mental health? Depression may occur at any age, but it is more common as you become older. Common symptoms of depression include:  Low or sad mood.  Changes in sleep patterns.  Changes in appetite or eating patterns.  Feeling an overall lack of motivation or enjoyment of activities that you previously enjoyed.  Frequent crying spells.  Talk with your health care provider if you think that you are experiencing depression. What should I know about immunizations? It is important that you get and maintain your immunizations. These include:  Tetanus, diphtheria, and pertussis (Tdap) booster vaccine.  Influenza every year before the flu season begins.  Pneumonia vaccine.  Shingles vaccine.  Your health care provider may also recommend other immunizations. This information is not intended to replace advice given to you  by your health care provider. Make sure you discuss any questions you have with your health care provider. Document Released: 08/20/2005 Document Revised: 01/16/2016 Document Reviewed: 04/01/2015 Elsevier Interactive Patient Education  2018 [employer].

## 2017-04-12 ENCOUNTER — Ambulatory Visit (INDEPENDENT_AMBULATORY_CARE_PROVIDER_SITE_OTHER): Payer: Medicare Other | Admitting: Psychology

## 2017-04-12 DIAGNOSIS — M4150 Other secondary scoliosis, site unspecified: Secondary | ICD-10-CM | POA: Diagnosis not present

## 2017-04-12 DIAGNOSIS — F331 Major depressive disorder, recurrent, moderate: Secondary | ICD-10-CM

## 2017-04-12 DIAGNOSIS — M545 Low back pain: Secondary | ICD-10-CM | POA: Diagnosis not present

## 2017-04-12 DIAGNOSIS — M961 Postlaminectomy syndrome, not elsewhere classified: Secondary | ICD-10-CM | POA: Diagnosis not present

## 2017-04-12 DIAGNOSIS — M5431 Sciatica, right side: Secondary | ICD-10-CM | POA: Diagnosis not present

## 2017-04-12 DIAGNOSIS — M4726 Other spondylosis with radiculopathy, lumbar region: Secondary | ICD-10-CM | POA: Diagnosis not present

## 2017-04-13 ENCOUNTER — Other Ambulatory Visit (HOSPITAL_BASED_OUTPATIENT_CLINIC_OR_DEPARTMENT_OTHER): Payer: Self-pay | Admitting: Orthopaedic Surgery

## 2017-04-13 DIAGNOSIS — M961 Postlaminectomy syndrome, not elsewhere classified: Secondary | ICD-10-CM

## 2017-04-13 DIAGNOSIS — M4726 Other spondylosis with radiculopathy, lumbar region: Secondary | ICD-10-CM

## 2017-04-16 ENCOUNTER — Ambulatory Visit (HOSPITAL_BASED_OUTPATIENT_CLINIC_OR_DEPARTMENT_OTHER)
Admission: RE | Admit: 2017-04-16 | Discharge: 2017-04-16 | Disposition: A | Discharge status: Home / Self Care | Payer: Medicare Other | Source: Ambulatory Visit | Attending: Orthopaedic Surgery | Admitting: Orthopaedic Surgery

## 2017-04-16 DIAGNOSIS — M48061 Spinal stenosis, lumbar region without neurogenic claudication: Secondary | ICD-10-CM | POA: Insufficient documentation

## 2017-04-16 DIAGNOSIS — M545 Low back pain: Secondary | ICD-10-CM | POA: Diagnosis not present

## 2017-04-16 DIAGNOSIS — M4726 Other spondylosis with radiculopathy, lumbar region: Secondary | ICD-10-CM | POA: Diagnosis present

## 2017-04-16 DIAGNOSIS — M5136 Other intervertebral disc degeneration, lumbar region: Secondary | ICD-10-CM | POA: Diagnosis not present

## 2017-04-16 DIAGNOSIS — M961 Postlaminectomy syndrome, not elsewhere classified: Secondary | ICD-10-CM | POA: Diagnosis not present

## 2017-04-20 DIAGNOSIS — M1812 Unilateral primary osteoarthritis of first carpometacarpal joint, left hand: Secondary | ICD-10-CM | POA: Diagnosis not present

## 2017-04-20 DIAGNOSIS — M79645 Pain in left finger(s): Secondary | ICD-10-CM | POA: Diagnosis not present

## 2017-04-20 DIAGNOSIS — G8929 Other chronic pain: Secondary | ICD-10-CM | POA: Diagnosis not present

## 2017-04-20 DIAGNOSIS — M25532 Pain in left wrist: Secondary | ICD-10-CM | POA: Diagnosis not present

## 2017-04-21 ENCOUNTER — Telehealth: Payer: Self-pay | Admitting: *Deleted

## 2017-04-21 NOTE — Telephone Encounter (Signed)
Received Physician Orders for CPAP supplies from [facility]; forwarded to provider/SLS 10/11

## 2017-04-25 ENCOUNTER — Ambulatory Visit (HOSPITAL_BASED_OUTPATIENT_CLINIC_OR_DEPARTMENT_OTHER)
Admission: RE | Admit: 2017-04-25 | Discharge: 2017-04-25 | Disposition: A | Discharge status: Home / Self Care | Payer: Medicare Other | Source: Ambulatory Visit | Attending: Family | Admitting: Family

## 2017-04-25 ENCOUNTER — Other Ambulatory Visit: Payer: Self-pay | Admitting: Family

## 2017-04-25 ENCOUNTER — Encounter: Payer: Self-pay | Admitting: Family

## 2017-04-25 DIAGNOSIS — Z1231 Encounter for screening mammogram for malignant neoplasm of breast: Secondary | ICD-10-CM | POA: Insufficient documentation

## 2017-04-25 DIAGNOSIS — Z780 Asymptomatic menopausal state: Secondary | ICD-10-CM | POA: Diagnosis not present

## 2017-04-25 DIAGNOSIS — M85851 Other specified disorders of bone density and structure, right thigh: Secondary | ICD-10-CM | POA: Diagnosis not present

## 2017-04-25 MED ORDER — VITAMIN D 1000 UNITS PO TABS: 1000.0000 [IU] | ORAL_TABLET | Freq: Two times a day (BID) | ORAL | Status: AC

## 2017-04-26 DIAGNOSIS — M4726 Other spondylosis with radiculopathy, lumbar region: Secondary | ICD-10-CM | POA: Diagnosis not present

## 2017-04-26 DIAGNOSIS — M961 Postlaminectomy syndrome, not elsewhere classified: Secondary | ICD-10-CM | POA: Diagnosis not present

## 2017-04-26 DIAGNOSIS — M4150 Other secondary scoliosis, site unspecified: Secondary | ICD-10-CM | POA: Diagnosis not present

## 2017-04-26 DIAGNOSIS — M5431 Sciatica, right side: Secondary | ICD-10-CM | POA: Diagnosis not present

## 2017-05-13 DIAGNOSIS — Z6827 Body mass index (BMI) 27.0-27.9, adult: Secondary | ICD-10-CM | POA: Diagnosis not present

## 2017-05-13 DIAGNOSIS — M961 Postlaminectomy syndrome, not elsewhere classified: Secondary | ICD-10-CM | POA: Diagnosis not present

## 2017-05-13 DIAGNOSIS — M5416 Radiculopathy, lumbar region: Secondary | ICD-10-CM | POA: Diagnosis not present

## 2017-05-16 ENCOUNTER — Ambulatory Visit (INDEPENDENT_AMBULATORY_CARE_PROVIDER_SITE_OTHER): Payer: Medicare Other | Admitting: Family

## 2017-05-16 ENCOUNTER — Encounter: Payer: Self-pay | Admitting: Family

## 2017-05-16 VITALS — BP 131/67 | HR 92 | Temp 98.1°F | Resp 16 | Ht 64.0 in | Wt 158.8 lb

## 2017-05-16 DIAGNOSIS — J029 Acute pharyngitis, unspecified: Secondary | ICD-10-CM | POA: Diagnosis not present

## 2017-05-16 DIAGNOSIS — J069 Acute upper respiratory infection, unspecified: Secondary | ICD-10-CM | POA: Diagnosis not present

## 2017-05-16 DIAGNOSIS — B9789 Other viral agents as the cause of diseases classified elsewhere: Secondary | ICD-10-CM

## 2017-05-16 LAB — POCT RAPID STREP A (OFFICE): RAPID STREP A SCREEN: NEGATIVE

## 2017-05-16 MED ORDER — BENZONATATE 100 MG PO CAPS: 100.0000 mg | ORAL_CAPSULE | Freq: Three times a day (TID) | ORAL | 0 refills | Status: DC | PRN

## 2017-05-16 NOTE — Progress Notes (Signed)
Subjective:    Patient ID: Vanessa Oliver, female    DOB: 07/29/1950, 66 y.o.   MRN: [id number] HPI  Patient is a 66 yr old female who presents today with chief complaint of cough.  Reports that symptoms began on 05/11/17 with a scratchy throat. Then developed productive cough, HA.  Using otc sinus tablets.  Denies fever.     Review of Systems See HPI  Past Medical History:  Diagnosis Date  . Allergy    takes Singulair at night  . Anxiety   . Arthritis   . Chronic back pain    stenosis  . Depression    takes CYmbalta daily  . Esophageal stricture   . Family history of adverse reaction to anesthesia    oldest brother had trouble with anesthesia a long time ago but can't recall what  . Fibromyalgia   . GERD (gastroesophageal reflux disease)    takes Omeprazole daily  . Hemorrhoids   . History of bronchitis   . Hyperlipidemia   . IBS (irritable bowel syndrome)   . Joint pain   . Joint swelling   . Plantar fasciitis   . Rosacea   . Sleep apnea    cpap- settings at 10 breathing   . TMJ (temporomandibular joint disorder)   . Vasovagal episode back in the 80's  . Vasovagal syncope   . Weakness    right leg     Social History   Socioeconomic History  . Marital status: Married    Spouse name: Not on file  . Number of children: 0  . Years of education: Not on file  . Highest education level: Not on file  Social Needs  . Financial resource strain: Not on file  . Food insecurity - worry: Not on file  . Food insecurity - inability: Not on file  . Transportation needs - medical: Not on file  . Transportation needs - non-medical: Not on file  Occupational History  . Occupation: Hydrologist: [employer]  Tobacco Use  . Smoking status: Never Smoker  . Smokeless tobacco: Never Used  Substance and Sexual Activity  . Alcohol use: Yes    Alcohol/week: 0.0 oz    Comment: rarely  . Drug use: No  . Sexual activity: No    Birth control/protection: Surgical    Other Topics Concern  . Not on file  Social History Narrative   Works as an Glass blower/designer   Married- husband is 63 years older than her   Former Dance movement psychotherapist up up [employer]   Has cats   Enjoys reading   No children    Past Surgical History:  Procedure Laterality Date  . ABDOMINAL HYSTERECTOMY  1997  . CARPAL TUNNEL RELEASE Bilateral 2004  . CARPOMETACARPAL (CMC) FUSION OF THUMB  2015   thumb  . CATARACT EXTRACTION Right 2013   Dr Ellison Hughs  . CATARACT EXTRACTION Left 04/2016  . COLONOSCOPY    . COLONOSCOPY WITH ESOPHAGOGASTRODUODENOSCOPY (EGD) AND ESOPHAGEAL DILATION (ED)    . EYE SURGERY     scar tissue removed after cataract surgery  . 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 KNEE ARTHROSCOPY Right 1994   x 3  . KNEE SURGERY Right 01/21/2006  . mallet finger Right 2011  . NASAL SEPTUM SURGERY  1970  . NISSEN FUNDOPLICATION  6629  . RETINAL LASER PROCEDURE Right 02/01/2011   right eye   . TONSILLECTOMY  1981  .  TRIGGER FINGER RELEASE Bilateral 2005  . VARICOSE VEIN SURGERY Bilateral 2007, 2009   left 2007, right 2009  . VITRECTOMY Right 2012    Family History  Problem Relation Age of Onset  . Breast cancer Mother   . Arthritis Mother   . Hyperlipidemia Mother   . Hypertension Mother   . Lung cancer Father   . Pancreatic cancer Father   . Skin cancer Father   . Arthritis Father   . Hyperlipidemia Maternal Grandfather   . Heart disease Maternal Grandfather   . Stroke Maternal Grandfather   . Celiac disease Brother     Allergies  Allergen Reactions  . Aleve [Naproxen Sodium]     Swelling, itching  . Cefuroxime Axetil     Hives / "throat swelling"  . Chlorzoxazone Rash and Other (See Comments)    "breathing problems"   . Oxycodone     Hallucinations, rash  . Penicillins Anaphylaxis and Hives    Has patient had a PCN reaction causing immediate rash, facial/tongue/throat swelling, SOB or lightheadedness with hypotension: Yes Has patient had a  PCN reaction causing severe rash involving mucus membranes or skin necrosis: No Has patient had a PCN reaction that required hospitalization No Has patient had a PCN reaction occurring within the last 10 years: No If all of the above answers are "NO", then may proceed with Cephalosporin use.   . Adhesive [Tape]     rash  . Cataflam [Diclofenac Potassium]     Lip swelling  . Clarithromycin Diarrhea  . Flagyl [Metronidazole Hcl]     ?diarrhea  . Glucosamine Hives  . Guaifenesin & Derivatives     Stomach upset  . Hydrocodone-Acetaminophen     REACTION: rash, tightening of throat  . Ketorolac Tromethamine     ?reaction  . Pentazocine     Involuntary twitching  . Pneumovax [Pneumococcal Polysaccharide Vaccine] Other (See Comments)    Redness/swelling at site, nausea  . Prevnar [Pneumococcal 13-Val Conj Vacc] Other (See Comments)    Swelling, redness, nausea  . Smz-Tmp Ds [Sulfamethoxazole W/Trimethoprim (Co-Trimoxazole)]     ?unknown reaction  . Tramadol     constipation  . Trazodone And Nefazodone Other (See Comments)    Scratchy throat / congestion  . Latex Rash    Current Outpatient Medications on File Prior to Visit  Medication Sig Dispense Refill  . aspirin 81 MG tablet Take 81 mg by mouth daily.    . Azelaic Acid (FINACEA) 15 % cream Apply small amount to clean dry skin twice daily. 90 g 3  . azithromycin (ZITHROMAX) 250 MG tablet 2 tabs by mouth now, then one tab once daily for 4 more days. 6 tablet 0  . benzoyl peroxide 5 % gel Apply 1 application topically 2 (two) times daily.     Marland Kitchen CALCIUM CITRATE PO Take 1 tablet by mouth daily.    . cholecalciferol (VITAMIN D) 1000 units tablet Take 1 tablet (1,000 Units total) by mouth 2 (two) times daily.    . DULoxetine (CYMBALTA) 60 MG capsule Take 1 capsule (60 mg total) by mouth daily. 90 capsule 1  . EPINEPHrine (EPIPEN 2-PAK) 0.3 mg/0.3 mL IJ SOAJ injection Inject 0.3 mLs (0.3 mg total) into the muscle once. 1 Device 1  .  FIBER PO Take 2 capsules by mouth 2 (two) times daily.    . fluticasone (FLONASE) 50 MCG/ACT nasal spray Place 1 spray into both nostrils at bedtime.    Marland Kitchen loratadine (CLARITIN) 10 MG tablet  Take 10 mg by mouth daily as needed for allergies.    . Multiple Vitamin (MULTIVITAMIN) tablet Take 1 tablet by mouth daily.    . pantoprazole (PROTONIX) 40 MG tablet Take 1 tablet (40 mg total) by mouth daily. 30 minutes before breakfast or dinner. 30 tablet 5  . Polyethylene Glycol 400 (BLINK TEARS) 0.25 % GEL Apply 1 application to eye daily as needed (dry eyes).    . pravastatin (PRAVACHOL) 40 MG tablet Take 1 tablet (40 mg total) by mouth daily. 90 tablet 1  . Probiotic Product (PROBIOTIC DAILY PO) Take 1 capsule by mouth daily.    . vitamin C (ASCORBIC ACID) 500 MG tablet Take 500 mg by mouth daily.    . VOLTAREN 1 % GEL Apply 1 application topically daily as needed.     No current facility-administered medications on file prior to visit.     BP 131/67 (BP Location: Right Arm, Cuff Size: Normal)   Pulse 92   Temp 98.1 F (36.7 C) (Oral)   Resp 16   Ht 5\' 4"  (1.626 m)   Wt 158 lb 12.8 oz (72 kg)   LMP 05/14/1996   SpO2 100%   BMI 27.26 kg/m       Objective:   Physical Exam  Constitutional: She is oriented to person, place, and time. She appears well-developed and well-nourished.  HENT:  Head: Normocephalic and atraumatic.  Right Ear: Tympanic membrane and ear canal normal.  Left Ear: Tympanic membrane and ear canal normal.  Mouth/Throat: No oropharyngeal exudate or posterior oropharyngeal edema.  Cardiovascular: Normal rate, regular rhythm and normal heart sounds.  No murmur heard. Pulmonary/Chest: Effort normal and breath sounds normal. No respiratory distress. She has no wheezes.  Lymphadenopathy:    She has no cervical adenopathy.  Neurological: She is alert and oriented to person, place, and time.  Psychiatric: She has a normal mood and affect. Her behavior is normal. Judgment  and thought content normal.          Assessment & Plan:  Viral URI with cough- rapid strep is negative. Pt is advised on supportive measures as follows: Please begin mucinex 600mg  twice daily. Begin tessalon perles 3x daily as needed for cough.  Tylenol or motrin as needed for pain. Call if new/worsening symptoms or if not improved in 3 days.

## 2017-05-16 NOTE — Patient Instructions (Signed)
Please begin mucinex 600mg  twice daily. Begin tessalon perles 3x daily as needed for cough.  Call if new/worsening symptoms or if not improved in 3 days.

## 2017-05-19 ENCOUNTER — Encounter: Payer: Self-pay | Admitting: Family

## 2017-05-19 MED ORDER — DOXYCYCLINE HYCLATE 100 MG PO TABS: 100.0000 mg | ORAL_TABLET | Freq: Two times a day (BID) | ORAL | 0 refills | Status: AC

## 2017-05-30 ENCOUNTER — Encounter: Payer: Self-pay | Admitting: Family

## 2017-05-30 NOTE — Telephone Encounter (Signed)
Contacted patient.  She reports pain in her shoulders and back.  She is using ibuprofen with some improvement.  Also using Voltaren gel with some improvement.  I advised her to discontinue either Voltaren gel or ibuprofen as she does not need to be using both.  In addition I suggested she try some over-the-counter salon pas or icy hot.  Also suggested that she add some standing x-rays strength Tylenol thousand milligrams twice daily.  Patient denies suicidal ideation.  She reports feeling hopeless to the pain at times but has no thoughts of hurting herself.

## 2017-05-31 DIAGNOSIS — M5416 Radiculopathy, lumbar region: Secondary | ICD-10-CM | POA: Diagnosis not present

## 2017-06-01 ENCOUNTER — Encounter: Payer: Self-pay | Admitting: Family

## 2017-06-10 ENCOUNTER — Encounter: Payer: Self-pay | Admitting: Family

## 2017-06-10 ENCOUNTER — Ambulatory Visit (INDEPENDENT_AMBULATORY_CARE_PROVIDER_SITE_OTHER): Payer: Medicare Other | Admitting: Family

## 2017-06-10 VITALS — BP 136/65 | HR 95 | Temp 98.3°F | Resp 16 | Ht 64.0 in | Wt 162.0 lb

## 2017-06-10 DIAGNOSIS — F329 Major depressive disorder, single episode, unspecified: Secondary | ICD-10-CM

## 2017-06-10 DIAGNOSIS — F32A Depression, unspecified: Secondary | ICD-10-CM

## 2017-06-10 MED ORDER — ESCITALOPRAM OXALATE 5 MG PO TABS: 5.0000 mg | ORAL_TABLET | Freq: Every day | ORAL | 1 refills | Status: DC

## 2017-06-10 NOTE — Progress Notes (Signed)
Subjective:    Patient ID: Vanessa Oliver, female    DOB: [birth year], 66 y.o.   MRN: [id number]  HPI  Vanessa Oliver is a 66 yr old female who presents today for follow up of her anxiety/depression. She is maintained on cymbalta. Feels like this is not controlling her mood as well as citalopram did.  Feels "worn out" by her back pain and fibro pain. 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   Has some shoulder pain and back pain. She is following at [facility]. And is having injections.  If this does not help they may consider surgery.     Review of Systems    see HPI  Past Medical History:  Diagnosis Date  . Allergy    takes Singulair at night  . Anxiety   . Arthritis   . Chronic back pain    stenosis  . Depression    takes CYmbalta daily  . Esophageal stricture   . Family history of adverse reaction to anesthesia    oldest brother had trouble with anesthesia a long time ago but can't recall what  . Fibromyalgia   . GERD (gastroesophageal reflux disease)    takes Omeprazole daily  . Hemorrhoids   . History of bronchitis   . Hyperlipidemia   . IBS (irritable bowel syndrome)   . Joint pain   . Joint swelling   . Plantar fasciitis   . Rosacea   . Sleep apnea    cpap- settings at 10 breathing   . TMJ (temporomandibular joint disorder)   . Vasovagal episode back in the 80's  . Vasovagal syncope   . Weakness    right leg     Social History   Socioeconomic History  . Marital status: Married    Spouse name: Not on file  . Number of children: 0  . Years of education: Not on file  . Highest education level: Not on file  Social Needs  . Financial resource strain: Not on file  . Food insecurity - worry: Not on file  . Food insecurity - inability: Not on file  . Transportation needs - medical: Not on file  . Transportation needs - non-medical: Not on file  Occupational History  . Occupation: Hydrologist: [employer]  Tobacco Use  . Smoking status: Never Smoker  . Smokeless tobacco: Never Used  Substance and Sexual Activity  . Alcohol use: Yes    Alcohol/week: 0.0 oz    Comment: rarely  . Drug use: No  . Sexual activity: No    Birth control/protection: Surgical  Other Topics Concern  . Not on file  Social History Narrative   Works as an Glass blower/designer   Married- husband is 87 years older than her   Former Dance movement psychotherapist up up [employer]   Has cats   Enjoys reading   No children    Past Surgical History:  Procedure Laterality Date  . ABDOMINAL HYSTERECTOMY  1997  . CARPAL TUNNEL RELEASE Bilateral 2004  . CARPOMETACARPAL (CMC) FUSION OF THUMB  2015   thumb  . CATARACT EXTRACTION Right 2013   Dr Ellison Hughs  . CATARACT EXTRACTION Left 04/2016  . COLONOSCOPY    . COLONOSCOPY WITH ESOPHAGOGASTRODUODENOSCOPY (EGD) AND ESOPHAGEAL DILATION (ED)    . EYE SURGERY     scar tissue removed after cataract surgery  . FOOT SURGERY Left 2006, 2007   mortans  neuroma  . KNEE ARTHROSCOPY Right 1994   x 3  . KNEE SURGERY Right 01/21/2006  . LUMBAR LAMINECTOMY/DECOMPRESSION MICRODISCECTOMY Right 02/11/2015   Procedure: Laminectomy and Foraminotomy - Right - L3-L4;  Surgeon: Earnie Larsson, MD;  Location: [facility];  Service: Neurosurgery;  Laterality: Right;  Laminectomy and Foraminotomy - Right - L3-L4  . mallet finger Right 2011  . NASAL SEPTUM SURGERY  1970  . NISSEN FUNDOPLICATION  2458  . RETINAL LASER PROCEDURE Right 02/01/2011   right eye   . TONSILLECTOMY  1981  . TOTAL KNEE ARTHROPLASTY Right 09/06/2016   Procedure: RIGHT TOTAL KNEE ARTHROPLASTY;  Surgeon: Gaynelle Arabian, MD;  Location: [facility];  Service: Orthopedics;  Laterality: Right;  . TRIGGER FINGER RELEASE Bilateral 2005  . VARICOSE VEIN SURGERY Bilateral 2007, 2009   left 2007, right 2009  . VITRECTOMY Right 2012    Family History  Problem Relation Age of Onset  . Breast cancer Mother   . Arthritis Mother   . Hyperlipidemia Mother    . Hypertension Mother   . Lung cancer Father   . Pancreatic cancer Father   . Skin cancer Father   . Arthritis Father   . Hyperlipidemia Maternal Grandfather   . Heart disease Maternal Grandfather   . Stroke Maternal Grandfather   . Celiac disease Brother     Allergies  Allergen Reactions  . Aleve [Naproxen Sodium]     Swelling, itching  . Cefuroxime Axetil     Hives / "throat swelling"  . Chlorzoxazone Rash and Other (See Comments)    "breathing problems"   . Oxycodone     Hallucinations, rash  . Penicillins Anaphylaxis and Hives    Has patient had a PCN reaction causing immediate rash, facial/tongue/throat swelling, SOB or lightheadedness with hypotension: Yes Has patient had a PCN reaction causing severe rash involving mucus membranes or skin necrosis: No Has patient had a PCN reaction that required hospitalization No Has patient had a PCN reaction occurring within the last 10 years: No If all of the above answers are "NO", then may proceed with Cephalosporin use.   . Adhesive [Tape]     rash  . Cataflam [Diclofenac Potassium]     Lip swelling  . Clarithromycin Diarrhea  . Flagyl [Metronidazole Hcl]     ?diarrhea  . Glucosamine Hives  . Guaifenesin & Derivatives     Stomach upset  . Hydrocodone-Acetaminophen     REACTION: rash, tightening of throat  . Ketorolac Tromethamine     ?reaction  . Pentazocine     Involuntary twitching  . Pneumovax [Pneumococcal Polysaccharide Vaccine] Other (See Comments)    Redness/swelling at site, nausea  . Prevnar [Pneumococcal 13-Val Conj Vacc] Other (See Comments)    Swelling, redness, nausea  . Smz-Tmp Ds [Sulfamethoxazole W/Trimethoprim (Co-Trimoxazole)]     ?unknown reaction  . Tramadol     constipation  . Trazodone And Nefazodone Other (See Comments)    Scratchy throat / congestion  . Latex Rash    Current Outpatient Medications on File Prior to Visit  Medication Sig Dispense Refill  . aspirin 81 MG tablet Take 81 mg  by mouth daily.    . Azelaic Acid (FINACEA) 15 % cream Apply small amount to clean dry skin twice daily. 90 g 3  . benzoyl peroxide 5 % gel Apply 1 application topically 2 (two) times daily.     Marland Kitchen CALCIUM CITRATE PO Take 1 tablet by mouth daily.    . cholecalciferol (VITAMIN  D) 1000 units tablet Take 1 tablet (1,000 Units total) by mouth 2 (two) times daily.    . DULoxetine (CYMBALTA) 60 MG capsule Take 1 capsule (60 mg total) by mouth daily. 90 capsule 1  . EPINEPHrine (EPIPEN 2-PAK) 0.3 mg/0.3 mL IJ SOAJ injection Inject 0.3 mLs (0.3 mg total) into the muscle once. 1 Device 1  . FIBER PO Take 2 capsules by mouth 2 (two) times daily.    . fluticasone (FLONASE) 50 MCG/ACT nasal spray Place 1 spray into both nostrils at bedtime.    Marland Kitchen loratadine (CLARITIN) 10 MG tablet Take 10 mg by mouth daily as needed for allergies.    . Multiple Vitamin (MULTIVITAMIN) tablet Take 1 tablet by mouth daily.    . pantoprazole (PROTONIX) 40 MG tablet Take 1 tablet (40 mg total) by mouth daily. 30 minutes before breakfast or dinner. 30 tablet 5  . Polyethylene Glycol 400 (BLINK TEARS) 0.25 % GEL Apply 1 application to eye daily as needed (dry eyes).    . pravastatin (PRAVACHOL) 40 MG tablet Take 1 tablet (40 mg total) by mouth daily. 90 tablet 1  . Probiotic Product (PROBIOTIC DAILY PO) Take 1 capsule by mouth daily.    . vitamin C (ASCORBIC ACID) 500 MG tablet Take 500 mg by mouth daily.    . VOLTAREN 1 % GEL Apply 1 application topically daily as needed.     No current facility-administered medications on file prior to visit.     BP 136/65 (BP Location: Right Arm, Patient Position: Sitting, Cuff Size: Small)   Pulse 95   Temp 98.3 F (36.8 C) (Oral)   Resp 16   Ht 5\' 4"  (1.626 m)   Wt 162 lb (73.5 kg)   LMP 05/14/1996   SpO2 99%   BMI 27.81 kg/m    Objective:   Physical Exam  Constitutional: She appears well-developed and well-nourished.  Cardiovascular: Normal rate, regular rhythm and normal heart  sounds.  No murmur heard. Pulmonary/Chest: Effort normal and breath sounds normal. No respiratory distress. She has no wheezes.  Psychiatric: She has a normal mood and affect. Her behavior is normal. Judgment and thought content normal.          Assessment & Plan:  Depression- uncontrolled- will add lexapro 5mg . Continue cymbalta for depression/fibro pain.We discussed trying to get some help around the house with cleaning.

## 2017-06-10 NOTE — Patient Instructions (Signed)
addd lexapro 5mg  once daily for depression.

## 2017-06-16 ENCOUNTER — Encounter: Payer: Self-pay | Admitting: Family

## 2017-06-18 ENCOUNTER — Other Ambulatory Visit: Payer: Self-pay | Admitting: Family

## 2017-06-21 DIAGNOSIS — M5416 Radiculopathy, lumbar region: Secondary | ICD-10-CM | POA: Diagnosis not present

## 2017-07-04 ENCOUNTER — Other Ambulatory Visit: Payer: Self-pay | Admitting: Family

## 2017-07-07 ENCOUNTER — Encounter: Payer: Self-pay | Admitting: Family

## 2017-07-15 ENCOUNTER — Ambulatory Visit (INDEPENDENT_AMBULATORY_CARE_PROVIDER_SITE_OTHER): Payer: Medicare Other | Admitting: Family

## 2017-07-15 ENCOUNTER — Ambulatory Visit: Payer: Medicare Other | Admitting: Family

## 2017-07-15 ENCOUNTER — Encounter: Payer: Self-pay | Admitting: Family

## 2017-07-15 VITALS — BP 122/56 | HR 88 | Temp 98.0°F | Resp 16 | Ht 64.0 in | Wt 162.0 lb

## 2017-07-15 DIAGNOSIS — F329 Major depressive disorder, single episode, unspecified: Secondary | ICD-10-CM

## 2017-07-15 DIAGNOSIS — F32A Depression, unspecified: Secondary | ICD-10-CM

## 2017-07-15 MED ORDER — AZELAIC ACID 15 % EX GEL: CUTANEOUS | 3 refills | Status: DC

## 2017-07-15 NOTE — Patient Instructions (Signed)
Please continue your current medications

## 2017-07-15 NOTE — Progress Notes (Signed)
Subjective:    Patient ID: Vanessa Oliver, female    DOB: 02-07-1951, 67 y.o.   MRN: [id number]  HPI  Ms. Avis is a 67 yr old female who presents today for follow up of her depression. Last visit she noted that her symptoms were not controlled with cymbalta. We added lexapro 5mg  once daily. Reports that she started lexapro and it caused her a headache.  Reports that she stopped lexapro and her headache resolved. Reports that her mood is better now. She has done some counseling but she did not find it very helpful.   Seeing ortho (Dr. Amedeo Plenty) for her left wrist and is putting off surgery. Wan'ts to work on her back pain first.   Has back pain which is being followed by Dr. Barry Brunner and is undergoing injections and is undergoing ESI's and surgical evaluation.    Review of Systems See HPI  Past Medical History:  Diagnosis Date  . Allergy    takes Singulair at night  . Anxiety   . Arthritis   . Chronic back pain    stenosis  . Depression    takes CYmbalta daily  . Esophageal stricture   . Family history of adverse reaction to anesthesia    oldest brother had trouble with anesthesia a long time ago but can't recall what  . Fibromyalgia   . GERD (gastroesophageal reflux disease)    takes Omeprazole daily  . Hemorrhoids   . History of bronchitis   . Hyperlipidemia   . IBS (irritable bowel syndrome)   . Joint pain   . Joint swelling   . Plantar fasciitis   . Rosacea   . Sleep apnea    cpap- settings at 10 breathing   . TMJ (temporomandibular joint disorder)   . Vasovagal episode back in the 80's  . Vasovagal syncope   . Weakness    right leg     Social History   Socioeconomic History  . Marital status: Married    Spouse name: Not on file  . Number of children: 0  . Years of education: Not on file  . Highest education level: Not on file  Social Needs  . Financial resource strain: Not on file  . Food insecurity - worry: Not on file  . Food insecurity - inability:  Not on file  . Transportation needs - medical: Not on file  . Transportation needs - non-medical: Not on file  Occupational History  . Occupation: Hydrologist: [employer]  Tobacco Use  . Smoking status: Never Smoker  . Smokeless tobacco: Never Used  Substance and Sexual Activity  . Alcohol use: Yes    Alcohol/week: 0.0 oz    Comment: rarely  . Drug use: No  . Sexual activity: No    Birth control/protection: Surgical  Other Topics Concern  . Not on file  Social History Narrative   Works as an Glass blower/designer   Married- husband is 65 years older than her   Former Dance movement psychotherapist up up [employer]   Has cats   Enjoys reading   No children    Past Surgical History:  Procedure Laterality Date  . ABDOMINAL HYSTERECTOMY  1997  . CARPAL TUNNEL RELEASE Bilateral 2004  . CARPOMETACARPAL (CMC) FUSION OF THUMB  2015   thumb  . CATARACT EXTRACTION Right 2013   Dr Ellison Hughs  . CATARACT EXTRACTION Left 04/2016  . COLONOSCOPY    . COLONOSCOPY WITH ESOPHAGOGASTRODUODENOSCOPY (EGD) AND ESOPHAGEAL  DILATION (ED)    . EYE SURGERY     scar tissue removed after cataract surgery  . 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 KNEE ARTHROSCOPY Right 1994   x 3  . KNEE SURGERY Right 01/21/2006  . LUMBAR LAMINECTOMY/DECOMPRESSION MICRODISCECTOMY Right 02/11/2015   Procedure: Laminectomy and Foraminotomy - Right - L3-L4;  Surgeon: Earnie Larsson, MD;  Location: [facility];  Service: Neurosurgery;  Laterality: Right;  Laminectomy and Foraminotomy - Right - L3-L4  . mallet finger Right 2011  . NASAL SEPTUM SURGERY  1970  . NISSEN FUNDOPLICATION  2353  . RETINAL LASER PROCEDURE Right 02/01/2011   right eye   . TONSILLECTOMY  1981  . TOTAL KNEE ARTHROPLASTY Right 09/06/2016   Procedure: RIGHT TOTAL KNEE ARTHROPLASTY;  Surgeon: Gaynelle Arabian, MD;  Location: [facility];  Service: Orthopedics;  Laterality: Right;  . TRIGGER FINGER RELEASE Bilateral 2005  . VARICOSE VEIN SURGERY Bilateral 2007, 2009     left 2007, right 2009  . VITRECTOMY Right 2012    Family History  Problem Relation Age of Onset  . Breast cancer Mother   . Arthritis Mother   . Hyperlipidemia Mother   . Hypertension Mother   . Lung cancer Father   . Pancreatic cancer Father   . Skin cancer Father   . Arthritis Father   . Hyperlipidemia Maternal Grandfather   . Heart disease Maternal Grandfather   . Stroke Maternal Grandfather   . Celiac disease Brother     Allergies  Allergen Reactions  . Aleve [Naproxen Sodium]     Swelling, itching  . Cefuroxime Axetil     Hives / "throat swelling"  . Chlorzoxazone Rash and Other (See Comments)    "breathing problems"   . Oxycodone     Hallucinations, rash  . Penicillins Anaphylaxis and Hives    Has patient had a PCN reaction causing immediate rash, facial/tongue/throat swelling, SOB or lightheadedness with hypotension: Yes Has patient had a PCN reaction causing severe rash involving mucus membranes or skin necrosis: No Has patient had a PCN reaction that required hospitalization No Has patient had a PCN reaction occurring within the last 10 years: No If all of the above answers are "NO", then may proceed with Cephalosporin use.   . Adhesive [Tape]     rash  . Cataflam [Diclofenac Potassium]     Lip swelling  . Clarithromycin Diarrhea  . Flagyl [Metronidazole Hcl]     ?diarrhea  . Glucosamine Hives  . Guaifenesin & Derivatives     Stomach upset  . Hydrocodone-Acetaminophen     REACTION: rash, tightening of throat  . Ketorolac Tromethamine     ?reaction  . Pentazocine     Involuntary twitching  . Pneumovax [Pneumococcal Polysaccharide Vaccine] Other (See Comments)    Redness/swelling at site, nausea  . Prevnar [Pneumococcal 13-Val Conj Vacc] Other (See Comments)    Swelling, redness, nausea  . Smz-Tmp Ds [Sulfamethoxazole W/Trimethoprim (Co-Trimoxazole)]     ?unknown reaction  . Tramadol     constipation  . Trazodone And Nefazodone Other (See  Comments)    Scratchy throat / congestion  . Latex Rash    Current Outpatient Medications on File Prior to Visit  Medication Sig Dispense Refill  . aspirin 81 MG tablet Take 81 mg by mouth daily.    . Azelaic Acid (FINACEA) 15 % cream Apply small amount to clean dry skin twice daily. 90 g 3  . benzoyl peroxide 5 %  gel Apply 1 application topically 2 (two) times daily.     Marland Kitchen CALCIUM CITRATE PO Take 1 tablet by mouth daily.    . cholecalciferol (VITAMIN D) 1000 units tablet Take 1 tablet (1,000 Units total) by mouth 2 (two) times daily.    . DULoxetine (CYMBALTA) 60 MG capsule Take 1 capsule (60 mg total) by mouth daily. 90 capsule 1  . EPINEPHrine (EPIPEN 2-PAK) 0.3 mg/0.3 mL IJ SOAJ injection Inject 0.3 mLs (0.3 mg total) into the muscle once. 1 Device 1  . FIBER PO Take 2 capsules by mouth 2 (two) times daily.    . fluticasone (FLONASE) 50 MCG/ACT nasal spray Place 1 spray into both nostrils daily. 48 g 1  . loratadine (CLARITIN) 10 MG tablet Take 10 mg by mouth daily as needed for allergies.    . Multiple Vitamin (MULTIVITAMIN) tablet Take 1 tablet by mouth daily.    . Polyethylene Glycol 400 (BLINK TEARS) 0.25 % GEL Apply 1 application to eye daily as needed (dry eyes).    . pravastatin (PRAVACHOL) 40 MG tablet TAKE 1 TABLET DAILY 90 tablet 1  . Probiotic Product (PROBIOTIC DAILY PO) Take 1 capsule by mouth daily.    . vitamin C (ASCORBIC ACID) 500 MG tablet Take 500 mg by mouth daily.    . VOLTAREN 1 % GEL Apply 1 application topically daily as needed.     No current facility-administered medications on file prior to visit.     BP (!) 122/56 (BP Location: Left Arm, Patient Position: Sitting, Cuff Size: Small)   Pulse 88   Temp 98 F (36.7 C) (Oral)   Resp 16   Ht 5\' 4"  (1.626 m)   Wt 162 lb (73.5 kg)   LMP 05/14/1996   SpO2 100%   BMI 27.81 kg/m       Objective:   Physical Exam  Constitutional: She is oriented to person, place, and time. She appears well-developed and  well-nourished.  Cardiovascular: Normal rate, regular rhythm and normal heart sounds.  No murmur heard. Pulmonary/Chest: Effort normal and breath sounds normal. No respiratory distress. She has no wheezes.  Musculoskeletal: She exhibits no edema.  Neurological: She is alert and oriented to person, place, and time.  Skin: Skin is warm and dry.  Psychiatric: She has a normal mood and affect. Her behavior is normal. Judgment and thought content normal.          Assessment & Plan:  Depression- improved even though she is no longer on lexapro. Continue cymbalta.     A total of 15 minutes were spent face-to-face with the patient during this encounter and over half of that time was spent on counseling and coordination of care. The patient was counseled on depression and treatment.

## 2017-07-20 DIAGNOSIS — M5416 Radiculopathy, lumbar region: Secondary | ICD-10-CM | POA: Diagnosis not present

## 2017-07-22 DIAGNOSIS — M1711 Unilateral primary osteoarthritis, right knee: Secondary | ICD-10-CM | POA: Diagnosis not present

## 2017-07-22 DIAGNOSIS — Z96651 Presence of right artificial knee joint: Secondary | ICD-10-CM | POA: Diagnosis not present

## 2017-07-27 DIAGNOSIS — M25532 Pain in left wrist: Secondary | ICD-10-CM | POA: Diagnosis not present

## 2017-08-08 DIAGNOSIS — M24139 Other articular cartilage disorders, unspecified wrist: Secondary | ICD-10-CM | POA: Diagnosis not present

## 2017-08-08 DIAGNOSIS — M25532 Pain in left wrist: Secondary | ICD-10-CM | POA: Diagnosis not present

## 2017-08-08 DIAGNOSIS — M1812 Unilateral primary osteoarthritis of first carpometacarpal joint, left hand: Secondary | ICD-10-CM | POA: Diagnosis not present

## 2017-08-08 DIAGNOSIS — M24132 Other articular cartilage disorders, left wrist: Secondary | ICD-10-CM | POA: Diagnosis not present

## 2017-08-19 DIAGNOSIS — M5416 Radiculopathy, lumbar region: Secondary | ICD-10-CM | POA: Diagnosis not present

## 2017-08-19 DIAGNOSIS — M4150 Other secondary scoliosis, site unspecified: Secondary | ICD-10-CM | POA: Diagnosis not present

## 2017-08-23 DIAGNOSIS — M24132 Other articular cartilage disorders, left wrist: Secondary | ICD-10-CM | POA: Diagnosis not present

## 2017-08-23 DIAGNOSIS — M25532 Pain in left wrist: Secondary | ICD-10-CM | POA: Diagnosis not present

## 2017-08-23 DIAGNOSIS — G8918 Other acute postprocedural pain: Secondary | ICD-10-CM | POA: Diagnosis not present

## 2017-08-23 DIAGNOSIS — M1812 Unilateral primary osteoarthritis of first carpometacarpal joint, left hand: Secondary | ICD-10-CM | POA: Diagnosis not present

## 2017-08-23 DIAGNOSIS — M19032 Primary osteoarthritis, left wrist: Secondary | ICD-10-CM | POA: Diagnosis not present

## 2017-09-06 DIAGNOSIS — M1812 Unilateral primary osteoarthritis of first carpometacarpal joint, left hand: Secondary | ICD-10-CM | POA: Diagnosis not present

## 2017-09-06 DIAGNOSIS — Z4889 Encounter for other specified surgical aftercare: Secondary | ICD-10-CM | POA: Diagnosis not present

## 2017-09-06 DIAGNOSIS — M79642 Pain in left hand: Secondary | ICD-10-CM | POA: Diagnosis not present

## 2017-09-11 ENCOUNTER — Other Ambulatory Visit: Payer: Self-pay | Admitting: Family

## 2017-09-16 ENCOUNTER — Encounter: Payer: Self-pay | Admitting: Family

## 2017-09-21 DIAGNOSIS — M79642 Pain in left hand: Secondary | ICD-10-CM | POA: Diagnosis not present

## 2017-09-21 DIAGNOSIS — M1812 Unilateral primary osteoarthritis of first carpometacarpal joint, left hand: Secondary | ICD-10-CM | POA: Diagnosis not present

## 2017-09-21 DIAGNOSIS — Z4889 Encounter for other specified surgical aftercare: Secondary | ICD-10-CM | POA: Diagnosis not present

## 2017-10-05 DIAGNOSIS — M25642 Stiffness of left hand, not elsewhere classified: Secondary | ICD-10-CM | POA: Diagnosis not present

## 2017-10-05 DIAGNOSIS — Z4889 Encounter for other specified surgical aftercare: Secondary | ICD-10-CM | POA: Diagnosis not present

## 2017-10-05 DIAGNOSIS — M79642 Pain in left hand: Secondary | ICD-10-CM | POA: Diagnosis not present

## 2017-10-05 DIAGNOSIS — M1812 Unilateral primary osteoarthritis of first carpometacarpal joint, left hand: Secondary | ICD-10-CM | POA: Diagnosis not present

## 2017-10-12 DIAGNOSIS — M79642 Pain in left hand: Secondary | ICD-10-CM | POA: Diagnosis not present

## 2017-10-18 DIAGNOSIS — M79642 Pain in left hand: Secondary | ICD-10-CM | POA: Diagnosis not present

## 2017-10-20 DIAGNOSIS — M79642 Pain in left hand: Secondary | ICD-10-CM | POA: Diagnosis not present

## 2017-10-25 DIAGNOSIS — M79642 Pain in left hand: Secondary | ICD-10-CM | POA: Diagnosis not present

## 2017-10-27 DIAGNOSIS — M79642 Pain in left hand: Secondary | ICD-10-CM | POA: Diagnosis not present

## 2017-11-01 DIAGNOSIS — M79642 Pain in left hand: Secondary | ICD-10-CM | POA: Diagnosis not present

## 2017-11-02 DIAGNOSIS — Z4889 Encounter for other specified surgical aftercare: Secondary | ICD-10-CM | POA: Diagnosis not present

## 2017-11-02 DIAGNOSIS — M25642 Stiffness of left hand, not elsewhere classified: Secondary | ICD-10-CM | POA: Diagnosis not present

## 2017-11-02 DIAGNOSIS — M1812 Unilateral primary osteoarthritis of first carpometacarpal joint, left hand: Secondary | ICD-10-CM | POA: Diagnosis not present

## 2017-11-11 DIAGNOSIS — M4150 Other secondary scoliosis, site unspecified: Secondary | ICD-10-CM | POA: Diagnosis not present

## 2017-11-11 DIAGNOSIS — M47816 Spondylosis without myelopathy or radiculopathy, lumbar region: Secondary | ICD-10-CM | POA: Diagnosis not present

## 2017-11-15 DIAGNOSIS — Z96651 Presence of right artificial knee joint: Secondary | ICD-10-CM | POA: Diagnosis not present

## 2017-11-15 DIAGNOSIS — M659 Synovitis and tenosynovitis, unspecified: Secondary | ICD-10-CM | POA: Diagnosis not present

## 2017-11-15 DIAGNOSIS — M65861 Other synovitis and tenosynovitis, right lower leg: Secondary | ICD-10-CM | POA: Diagnosis not present

## 2017-12-02 ENCOUNTER — Other Ambulatory Visit: Payer: Self-pay | Admitting: Family

## 2017-12-08 ENCOUNTER — Encounter: Payer: Self-pay | Admitting: Pulmonary Disease

## 2017-12-17 ENCOUNTER — Encounter: Payer: Self-pay | Admitting: Family

## 2017-12-17 DIAGNOSIS — Z91018 Allergy to other foods: Secondary | ICD-10-CM

## 2017-12-19 ENCOUNTER — Telehealth: Payer: Self-pay

## 2017-12-19 ENCOUNTER — Telehealth: Payer: Self-pay | Admitting: Internal Medicine

## 2017-12-19 NOTE — Telephone Encounter (Signed)
I spoke with the patient and let her know that I sent a message to Dr Henrene Pastor about the EGD with dilation.

## 2017-12-19 NOTE — Telephone Encounter (Signed)
1. Pill dysphagia. Slightly worse. Based on the esophagram she may have some narrowing at the esophageal junction. Also may have some mild dysmotility. I offered the patient repeat endoscopy would large-caliber esophageal dilation. She wishes to hold off #2. Colon cancer screening. Up-to-date. Due for colonoscopy next August #3. GERD. Status post Nissen fundoplication. Recurrent GERD symptoms post-fundoplication requiring PPI.  This is from your office note on 04-04-18. She would now like to proceed with the EGD and dilation.  Please advise.  Thank you.

## 2017-12-19 NOTE — Telephone Encounter (Signed)
Okay to schedule colonoscopy with EGD and dilation

## 2017-12-19 NOTE — Telephone Encounter (Signed)
Pt is due for a colon in August. She wants to have an EGD with dill as well. She said that she mentioned this to Dr. Henrene Pastor on her last visit and he agreed. Pls advise if it is ok to schedule patient for both procedures.

## 2017-12-19 NOTE — Telephone Encounter (Signed)
Okay to schedule colonoscopy and EGD with dilation concurrently

## 2017-12-21 NOTE — Telephone Encounter (Signed)
Left message to call back to schedule appt

## 2017-12-24 ENCOUNTER — Encounter: Payer: Self-pay | Admitting: Family

## 2017-12-28 ENCOUNTER — Ambulatory Visit (INDEPENDENT_AMBULATORY_CARE_PROVIDER_SITE_OTHER): Payer: Medicare Other | Admitting: Family

## 2017-12-28 ENCOUNTER — Encounter: Payer: Self-pay | Admitting: Family

## 2017-12-28 VITALS — BP 139/76 | HR 79 | Temp 98.2°F | Resp 18 | Ht 64.0 in | Wt 162.6 lb

## 2017-12-28 DIAGNOSIS — F329 Major depressive disorder, single episode, unspecified: Secondary | ICD-10-CM

## 2017-12-28 DIAGNOSIS — M545 Low back pain: Secondary | ICD-10-CM | POA: Diagnosis not present

## 2017-12-28 DIAGNOSIS — F32A Depression, unspecified: Secondary | ICD-10-CM

## 2017-12-28 DIAGNOSIS — L304 Erythema intertrigo: Secondary | ICD-10-CM | POA: Diagnosis not present

## 2017-12-28 MED ORDER — CLOTRIMAZOLE-BETAMETHASONE 1-0.05 % EX CREA
1.0000 | TOPICAL_CREAM | Freq: Two times a day (BID) | CUTANEOUS | 0 refills | Status: DC
Start: 2017-12-28 — End: 2018-05-15

## 2017-12-28 NOTE — Progress Notes (Signed)
Subjective:    Patient ID: Vanessa Oliver, female    DOB: 1951/01/26, 67 y.o.   MRN: [id number]  HPI  Pt presents to discuss several concerns:  1) back pain- notes increased low back pain since she did yard work last week.  2) Umbilical pain-  Reports pain/redness of umbilicus.  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 She is very stressed by this. Her brother is being treated for lymhoma.   Review of Systems    see HPI  Past Medical History:  Diagnosis Date  . Allergy    takes Singulair at night  . Anxiety   . Arthritis   . Chronic back pain    stenosis  . Depression    takes CYmbalta daily  . Esophageal stricture   . Family history of adverse reaction to anesthesia    oldest brother had trouble with anesthesia a long time ago but can't recall what  . Fibromyalgia   . Fibromyalgia   . GERD (gastroesophageal reflux disease)    takes Omeprazole daily  . Hemorrhoids   . History of bronchitis   . Hyperlipidemia   . IBS (irritable bowel syndrome)   . Joint pain   . Joint swelling   . Plantar fasciitis   . Rosacea   . Sleep apnea    cpap- settings at 10 breathing   . TMJ (temporomandibular joint disorder)   . Vasovagal episode back in the 80's  . Vasovagal syncope   . Weakness    right leg     Social History   Socioeconomic History  . Marital status: Married    Spouse name: Not on file  . Number of children: 0  . Years of education: Not on file  . Highest education level: Not on file  Occupational History  . Occupation: Hydrologist: [employer]  . Financial resource strain: Not on file  . Food insecurity:    Worry: Not on file    Inability: Not on file  . Transportation needs:    Medical: Not on file    Non-medical: Not on file  Tobacco Use  . Smoking status: Never Smoker  . Smokeless tobacco: Never Used  Substance and Sexual Activity  . Alcohol use: Yes    Alcohol/week: 0.0 oz   Comment: rarely  . Drug use: No  . Sexual activity: Never    Birth control/protection: Surgical  Lifestyle  . Physical activity:    Days per week: Not on file    Minutes per session: Not on file  . Stress: Not on file  Relationships  . Social connections:    Talks on phone: Not on file    Gets together: Not on file    Attends religious service: Not on file    Active member of club or organization: Not on file    Attends meetings of clubs or organizations: Not on file    Relationship status: Not on file  . Intimate partner violence:    Fear of current or ex partner: Not on file    Emotionally abused: Not on file    Physically abused: Not on file    Forced sexual activity: Not on file  Other Topics Concern  . Not on file  Social History Narrative   Works as an Glass blower/designer   Married- husband is 22 years older than her   Former Dance movement psychotherapist up up [employer]  Has cats   Enjoys reading   No children    Past Surgical History:  Procedure Laterality Date  . ABDOMINAL HYSTERECTOMY  1997  . CARPAL TUNNEL RELEASE Bilateral 2004  . CARPOMETACARPAL (CMC) FUSION OF THUMB  2015   thumb  . CATARACT EXTRACTION Right 2013   Dr Ellison Hughs  . CATARACT EXTRACTION Left 04/2016  . COLONOSCOPY    . COLONOSCOPY WITH ESOPHAGOGASTRODUODENOSCOPY (EGD) AND ESOPHAGEAL DILATION (ED)    . EYE SURGERY     scar tissue removed after cataract surgery  . 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 KNEE ARTHROSCOPY Right 1994   x 3  . KNEE SURGERY Right 01/21/2006  . LUMBAR LAMINECTOMY/DECOMPRESSION MICRODISCECTOMY Right 02/11/2015   Procedure: Laminectomy and Foraminotomy - Right - L3-L4;  Surgeon: Earnie Larsson, MD;  Location: [facility];  Service: Neurosurgery;  Laterality: Right;  Laminectomy and Foraminotomy - Right - L3-L4  . mallet finger Right 2011  . NASAL SEPTUM SURGERY  1970  . NISSEN FUNDOPLICATION  5852  . RETINAL LASER PROCEDURE Right 02/01/2011   right eye   . TONSILLECTOMY  1981    . TOTAL KNEE ARTHROPLASTY Right 09/06/2016   Procedure: RIGHT TOTAL KNEE ARTHROPLASTY;  Surgeon: Gaynelle Arabian, MD;  Location: [facility];  Service: Orthopedics;  Laterality: Right;  . TRIGGER FINGER RELEASE Bilateral 2005  . VARICOSE VEIN SURGERY Bilateral 2007, 2009   left 2007, right 2009  . VITRECTOMY Right 2012    Family History  Problem Relation Age of Onset  . Breast cancer Mother   . Arthritis Mother   . Hyperlipidemia Mother   . Hypertension Mother   . Lung cancer Father   . Pancreatic cancer Father   . Skin cancer Father   . Arthritis Father   . Lymphoma Brother   . Celiac disease Brother   . Hypertension Brother   . Thyroid disease Brother   . Hyperlipidemia Maternal Grandfather   . Heart disease Maternal Grandfather   . Stroke Maternal Grandfather     Allergies  Allergen Reactions  . Aleve [Naproxen Sodium]     Swelling, itching  . Cefuroxime Axetil     Hives / "throat swelling"  . Chlorzoxazone Rash and Other (See Comments)    "breathing problems"   . Oxycodone     Hallucinations, rash  . Penicillins Anaphylaxis and Hives    Has patient had a PCN reaction causing immediate rash, facial/tongue/throat swelling, SOB or lightheadedness with hypotension: Yes Has patient had a PCN reaction causing severe rash involving mucus membranes or skin necrosis: No Has patient had a PCN reaction that required hospitalization No Has patient had a PCN reaction occurring within the last 10 years: No If all of the above answers are "NO", then may proceed with Cephalosporin use.   . Adhesive [Tape]     rash  . Cataflam [Diclofenac Potassium]     Lip swelling  . Clarithromycin Diarrhea  . Flagyl [Metronidazole Hcl]     ?diarrhea  . Glucosamine Hives  . Guaifenesin & Derivatives     Stomach upset  . Hydrocodone-Acetaminophen     REACTION: rash, tightening of throat  . Ketorolac Tromethamine     ?reaction  . Pentazocine     Involuntary twitching  . Pneumovax  [Pneumococcal Polysaccharide Vaccine] Other (See Comments)    Redness/swelling at site, nausea  . Prevnar [Pneumococcal 13-Val Conj Vacc] Other (See Comments)    Swelling, redness, nausea  . Smz-Tmp Ds [Sulfamethoxazole W/Trimethoprim (  Co-Trimoxazole)]     ?unknown reaction  . Tramadol     constipation  . Trazodone And Nefazodone Other (See Comments)    Scratchy throat / congestion  . Latex Rash    Current Outpatient Medications on File Prior to Visit  Medication Sig Dispense Refill  . aspirin 81 MG tablet Take 81 mg by mouth daily.    . Azelaic Acid (FINACEA) 15 % cream Apply small amount to clean dry skin twice daily. 90 g 3  . benzoyl peroxide 5 % gel Apply 1 application topically 2 (two) times daily.     Marland Kitchen CALCIUM CITRATE PO Take 1 tablet by mouth daily.    . cholecalciferol (VITAMIN D) 1000 units tablet Take 1 tablet (1,000 Units total) by mouth 2 (two) times daily.    . DULoxetine (CYMBALTA) 60 MG capsule TAKE 1 CAPSULE DAILY 90 capsule 1  . EPINEPHrine (EPIPEN 2-PAK) 0.3 mg/0.3 mL IJ SOAJ injection Inject 0.3 mLs (0.3 mg total) into the muscle once. 1 Device 1  . FIBER PO Take 2 capsules by mouth 2 (two) times daily.    . fluticasone (FLONASE) 50 MCG/ACT nasal spray Place 1 spray into both nostrils daily. 48 g 1  . loratadine (CLARITIN) 10 MG tablet Take 10 mg by mouth daily as needed for allergies.    . Multiple Vitamin (MULTIVITAMIN) tablet Take 1 tablet by mouth daily.    . Polyethylene Glycol 400 (BLINK TEARS) 0.25 % GEL Apply 1 application to eye daily as needed (dry eyes).    . pravastatin (PRAVACHOL) 40 MG tablet TAKE 1 TABLET DAILY 90 tablet 1  . Probiotic Product (PROBIOTIC DAILY PO) Take 1 capsule by mouth daily.    . vitamin C (ASCORBIC ACID) 500 MG tablet Take 500 mg by mouth daily.    . VOLTAREN 1 % GEL Apply 1 application topically daily as needed.     No current facility-administered medications on file prior to visit.     Pulse 79   Temp 98.2 F (36.8 C)  (Oral)   Resp 18   Ht 5\' 4"  (1.626 m)   Wt 162 lb 9.6 oz (73.8 kg)   LMP 05/14/1996   SpO2 100%   BMI 27.91 kg/m    Objective:   Physical Exam  Constitutional: She appears well-developed and well-nourished.  Cardiovascular: Normal rate, regular rhythm and normal heart sounds.  No murmur heard. Pulmonary/Chest: Effort normal and breath sounds normal. No respiratory distress. She has no wheezes.  Psychiatric: She has a normal mood and affect. Her behavior is normal. Judgment and thought content normal.  skin- mild erythema noted in umbilicus        Assessment & Plan:  Intertrigo- trial of lotrisone to umbilicus.   Low back pain- I gave pt a handout on exercises to try at home.  Depression- exacerbated by stressors.  She continues Cymbalta.  I do not know if she needs any adjustment in her medications at this time.  We did discuss working on ways to decrease her stress such as to get her mother involved in an adult daycare program or possibly looking into assisted living options.  She notes that finances are a barrier to a lot of these ideas.

## 2017-12-28 NOTE — Patient Instructions (Addendum)
Apply lotrisone cream twice daily to belly button. Do below exercises twice daily. Let me know if back pain worsens or if it is not improved in 1 month.  Back Exercises The following exercises strengthen the muscles that help to support the back. They also help to keep the lower back flexible. Doing these exercises can help to prevent back pain or lessen existing pain. If you have back pain or discomfort, try doing these exercises 2-3 times each day or as told by your health care provider. When the pain goes away, do them once each day, but increase the number of times that you repeat the steps for each exercise (do more repetitions). If you do not have back pain or discomfort, do these exercises once each day or as told by your health care provider. Exercises Single Knee to Chest  Repeat these steps 3-5 times for each leg: 1. Lie on your back on a firm bed or the floor with your legs extended. 2. Bring one knee to your chest. Your other leg should stay extended and in contact with the floor. 3. Hold your knee in place by grabbing your knee or thigh. 4. Pull on your knee until you feel a gentle stretch in your lower back. 5. Hold the stretch for 10-30 seconds. 6. Slowly release and straighten your leg.  Pelvic Tilt  Repeat these steps 5-10 times: 1. Lie on your back on a firm bed or the floor with your legs extended. 2. Bend your knees so they are pointing toward the ceiling and your feet are flat on the floor. 3. Tighten your lower abdominal muscles to press your lower back against the floor. This motion will tilt your pelvis so your tailbone points up toward the ceiling instead of pointing to your feet or the floor. 4. With gentle tension and even breathing, hold this position for 5-10 seconds.  Cat-Cow  Repeat these steps until your lower back becomes more flexible: 1. Get into a hands-and-knees position on a firm surface. Keep your hands under your shoulders, and keep your knees under  your hips. You may place padding under your knees for comfort. 2. Let your head hang down, and point your tailbone toward the floor so your lower back becomes rounded like the back of a cat. 3. Hold this position for 5 seconds. 4. Slowly lift your head and point your tailbone up toward the ceiling so your back forms a sagging arch like the back of a cow. 5. Hold this position for 5 seconds.  Press-Ups  Repeat these steps 5-10 times: 1. Lie on your abdomen (face-down) on the floor. 2. Place your palms near your head, about shoulder-width apart. 3. While you keep your back as relaxed as possible and keep your hips on the floor, slowly straighten your arms to raise the top half of your body and lift your shoulders. Do not use your back muscles to raise your upper torso. You may adjust the placement of your hands to make yourself more comfortable. 4. Hold this position for 5 seconds while you keep your back relaxed. 5. Slowly return to lying flat on the floor.  Bridges  Repeat these steps 10 times: 1. Lie on your back on a firm surface. 2. Bend your knees so they are pointing toward the ceiling and your feet are flat on the floor. 3. Tighten your buttocks muscles and lift your buttocks off of the floor until your waist is at almost the same height as your  knees. You should feel the muscles working in your buttocks and the back of your thighs. If you do not feel these muscles, slide your feet 1-2 inches farther away from your buttocks. 4. Hold this position for 3-5 seconds. 5. Slowly lower your hips to the starting position, and allow your buttocks muscles to relax completely.  If this exercise is too easy, try doing it with your arms crossed over your chest. Abdominal Crunches  Repeat these steps 5-10 times: 1. Lie on your back on a firm bed or the floor with your legs extended. 2. Bend your knees so they are pointing toward the ceiling and your feet are flat on the floor. 3. Cross your  arms over your chest. 4. Tip your chin slightly toward your chest without bending your neck. 5. Tighten your abdominal muscles and slowly raise your trunk (torso) high enough to lift your shoulder blades a tiny bit off of the floor. Avoid raising your torso higher than that, because it can put too much stress on your low back and it does not help to strengthen your abdominal muscles. 6. Slowly return to your starting position.  Back Lifts Repeat these steps 5-10 times: 1. Lie on your abdomen (face-down) with your arms at your sides, and rest your forehead on the floor. 2. Tighten the muscles in your legs and your buttocks. 3. Slowly lift your chest off of the floor while you keep your hips pressed to the floor. Keep the back of your head in line with the curve in your back. Your eyes should be looking at the floor. 4. Hold this position for 3-5 seconds. 5. Slowly return to your starting position.  Contact a health care provider if:  Your back pain or discomfort gets much worse when you do an exercise.  Your back pain or discomfort does not lessen within 2 hours after you exercise. If you have any of these problems, stop doing these exercises right away. Do not do them again unless your health care provider says that you can. Get help right away if:  You develop sudden, severe back pain. If this happens, stop doing the exercises right away. Do not do them again unless your health care provider says that you can. This information is not intended to replace advice given to you by your health care provider. Make sure you discuss any questions you have with your health care provider. Document Released: 08/05/2004 Document Revised: 11/05/2015 Document Reviewed: 08/22/2014 Elsevier Interactive Patient Education  2017 [employer].

## 2018-01-13 ENCOUNTER — Ambulatory Visit: Payer: Medicare Other | Admitting: Family

## 2018-02-01 ENCOUNTER — Ambulatory Visit (INDEPENDENT_AMBULATORY_CARE_PROVIDER_SITE_OTHER): Payer: Medicare Other | Admitting: Allergy and Immunology

## 2018-02-01 ENCOUNTER — Encounter: Payer: Self-pay | Admitting: Allergy and Immunology

## 2018-02-01 VITALS — BP 112/66 | HR 87 | Temp 98.0°F | Resp 16 | Ht 64.0 in | Wt 164.0 lb

## 2018-02-01 DIAGNOSIS — Z91018 Allergy to other foods: Secondary | ICD-10-CM

## 2018-02-01 DIAGNOSIS — L500 Allergic urticaria: Secondary | ICD-10-CM

## 2018-02-01 DIAGNOSIS — J3089 Other allergic rhinitis: Secondary | ICD-10-CM

## 2018-02-01 DIAGNOSIS — K219 Gastro-esophageal reflux disease without esophagitis: Secondary | ICD-10-CM

## 2018-02-01 MED ORDER — RANITIDINE HCL 150 MG PO CAPS: 150.0000 mg | ORAL_CAPSULE | Freq: Two times a day (BID) | ORAL | 5 refills | Status: DC | PRN

## 2018-02-01 MED ORDER — AZELASTINE HCL 0.1 % NA SOLN: 1.0000 | Freq: Two times a day (BID) | NASAL | 5 refills | Status: DC | PRN

## 2018-02-01 MED ORDER — FEXOFENADINE HCL 180 MG PO TABS: 180.0000 mg | ORAL_TABLET | Freq: Every day | ORAL | 5 refills | Status: DC | PRN

## 2018-02-01 NOTE — Assessment & Plan Note (Signed)
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 Skin tests to select food allergens were negative today.  She has had episodes of urticaria in the past, typically when exposed to heat during the summertime.  She had extensive lab work to evaluate her urticaria in 2016 with all results being negative/within normal limits.  NSAIDs and emotional stress commonly exacerbate urticaria but are not the underlying etiology in this case. There are no concomitant symptoms concerning for anaphylaxis or constitutional symptoms worrisome for an underlying malignancy.   We will not order labs at this time, however, if lesions recur, persist, progress, or change in character, we will assess potential etiologies with screening labs.  For symptom relief, patient is to take oral antihistamines as directed.  Instructions have been discussed and provided for H1/H2 receptor blockade with titration to find lowest effective dose.  A prescription has been provided for fexofenadine (Allegra) 180 mg as needed.  A prescription has been provided for ranitidine (Zantac) 150 mg twice daily as needed.  Should symptoms recur, a journal is to be kept recording any foods eaten, beverages consumed, medications taken within a 6 hour period prior to the onset of symptoms, as well as record activities being performed, and environmental conditions. For any symptoms concerning for anaphylaxis, 911 is to be called immediately.

## 2018-02-01 NOTE — Assessment & Plan Note (Addendum)
Metallic taste in the mouth is not a common symptom associated with IgE mediated food allergies, however is commonly associated with acid reflux.  Treatment plan as outlined below for GERD.

## 2018-02-01 NOTE — Progress Notes (Signed)
New Patient Note  RE: Vanessa Oliver MRN: [id number] DOB: May 08, 1951 Date of Office Visit: 02/01/2018  Referring provider: Debbrah Alar, NP Primary care provider: Debbrah Alar, NP  Chief Complaint: Allergic Reaction; Urticaria; and Food Intolerance   History of present illness: Vanessa Oliver is a 67 y.o. female seen today in consultation requested by Debbrah Alar, NP.  Over the past 6-7 weeks, Vanessa Oliver has experienced recurrent episodes of hives. The hives have appeared at different times over her chest, back, arms, legs, hands and feet.  The lesions are described as erythematous, raised, and pruritic.  Individual hives last less than 24 hours without leaving residual pigmentation or bruising.  However, she notes that if she scratches the lesions hard enough they do bruise.  She denies concomitant angioedema, cardiopulmonary symptoms and GI symptoms. She has not experienced unexpected weight loss, recurrent fevers or drenching night sweats.  She believes that the hives may be triggered by heat because they seem to be worse on hot days, and she had similar hives approximately 3 years ago which occurred during the summer but then resolved as the weather grew cooler.  Otherwise, no specific medication, food, skin care product, detergent, soap, or other environmental triggers have been identified.  The symptoms do not seem to correlate with NSAIDs use or emotional stress. She did not have symptoms consistent with a respiratory tract infection at the time of symptom onset. Vanessa Oliver has tried to control symptoms with OTC antihistamines which have offered moderate temporary relief. She has not been evaluated and treated in the emergency department for these symptoms. Skin biopsy has not been performed.  Over the past year, the patient has noticed a metallic taste in her mouth when she consumes tomatoes.  She also notices a metallic taste with certain meals, however is unable to  identify specific foods other than tomatoes.  She does have a history of acid reflux, however is uncertain if the metallic taste is due to reflux or a food allergy. Vanessa Oliver experiences rhinorrhea, nasal congestion, and occasional sinus pressure.  No significant seasonal symptom variation has been noted nor have specific environmental triggers been identified.  She has noted that the rhinorrhea seems to be worse when eating meals.  She attempts to control the rhinorrhea with loratadine in the morning and fluticasone nasal spray at night.  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 Skin tests to select food allergens were negative today.  She has had episodes of urticaria in the past, typically when exposed to heat during the summertime.  She had extensive lab work to evaluate her urticaria in 2016 with all results being negative/within normal limits.  NSAIDs and emotional stress commonly exacerbate urticaria but are not the underlying etiology in this case. There are no concomitant symptoms concerning for anaphylaxis or constitutional symptoms worrisome for an underlying malignancy.   We will not order labs at this time, however, if lesions recur, persist, progress, or change in character, we will assess potential etiologies with screening labs.  For symptom relief, patient is to take oral antihistamines as directed.  Instructions have been discussed and provided for H1/H2 receptor blockade with titration to find lowest effective dose.  A prescription has been provided for fexofenadine (Allegra) 180 mg as needed.  A prescription has been provided for ranitidine (Zantac) 150 mg twice daily as needed.  Should symptoms recur, a journal is to be kept recording any foods eaten, beverages consumed, medications taken within  a 6 hour period prior to the onset of symptoms, as well as record activities being performed, and environmental  conditions. For any symptoms concerning for anaphylaxis, 911 is to be called immediately.  History of food allergy Metallic taste in the mouth is not a common symptom associated with IgE mediated food allergies, however is commonly associated with acid reflux.  Treatment plan as outlined below for GERD.  GERD  Reflux lifestyle modifications have been discussed and provided in written form.  Continue proton pump inhibitor as previously prescribed.  A prescription has been provided for ranitidine 150 mg twice daily.  Make note of any foods associated with symptoms and avoid these foods if possible.  Allergic rhinitis with a probable nonallergic component Aeroallergen avoidance measures have been discussed and provided in written form.  Fexofenadine (Allegra) or Claritin (loratadine) as needed.  A prescription has been provided for azelastine nasal spray, 1-2 sprays per nostril 2 times daily as needed. Proper nasal spray technique has been discussed and demonstrated.   Nasal saline spray (i.e., Simply Saline) or nasal saline lavage (i.e., NeilMed) is recommended as needed and prior to medicated nasal sprays.   Meds ordered this encounter  Medications  . fexofenadine (ALLEGRA) 180 MG tablet    Sig: Take 1 tablet (180 mg total) by mouth daily as needed for allergies or rhinitis.    Dispense:  30 tablet    Refill:  5  . ranitidine (ZANTAC) 150 MG capsule    Sig: Take 1 capsule (150 mg total) by mouth 2 (two) times daily as needed for heartburn.    Dispense:  60 capsule    Refill:  5  . azelastine (ASTELIN) 0.1 % nasal spray    Sig: Place 1-2 sprays into both nostrils 2 (two) times daily as needed.    Dispense:  30 mL    Refill:  5    Diagnostics: Environmental skin testing: Borderline positive to grass pollen and tree pollen. Food allergen skin testing: Negative despite a positive histamine control.    Physical examination: Blood pressure 112/66, pulse 87, temperature 98 F  (36.7 C), temperature source Oral, resp. rate 16, height 5\' 4"  (1.626 m), weight 164 lb (74.4 kg), last menstrual period 05/14/1996, SpO2 97 %.  General: Alert, interactive, in no acute distress. HEENT: TMs pearly gray, turbinates moderately edematous without discharge, post-pharynx unremarkable. Neck: Supple without lymphadenopathy. Lungs: Clear to auscultation without wheezing, rhonchi or rales. CV: Normal S1, S2 without murmurs. Abdomen: Nondistended, nontender. Skin: Warm and dry, without lesions or rashes. Extremities:  No clubbing, cyanosis or edema. Neuro:   Grossly intact.  Review of systems:  Review of systems negative except as noted in HPI / PMHx or noted below: Review of Systems  Constitutional: Negative.   HENT: Negative.   Eyes: Negative.   Respiratory: Negative.   Cardiovascular: Negative.   Gastrointestinal: Negative.   Genitourinary: Negative.   Musculoskeletal: Negative.   Skin: Negative.   Neurological: Negative.   Endo/Heme/Allergies: Negative.   Psychiatric/Behavioral: Negative.     Past medical history:  Past Medical History:  Diagnosis Date  . Allergy    takes Singulair at night  . Anxiety   . Arthritis   . Chronic back pain    stenosis  . Depression    takes CYmbalta daily  . Esophageal stricture   . Family history of adverse reaction to anesthesia    oldest brother had trouble with anesthesia a long time ago but can't recall what  . Fibromyalgia   .  Fibromyalgia   . GERD (gastroesophageal reflux disease)    takes Omeprazole daily  . Hemorrhoids   . History of bronchitis   . Hyperlipidemia   . IBS (irritable bowel syndrome)   . Joint pain   . Joint swelling   . Plantar fasciitis   . Rosacea   . Sleep apnea    cpap- settings at 10 breathing   . TMJ (temporomandibular joint disorder)   . Vasovagal episode back in the 80's  . Vasovagal syncope   . Weakness    right leg    Past surgical history:  Past Surgical History:  Procedure  Laterality Date  . ABDOMINAL HYSTERECTOMY  1997  . CARPAL TUNNEL RELEASE Bilateral 2004  . CARPOMETACARPAL (CMC) FUSION OF THUMB  2015   thumb  . CATARACT EXTRACTION Right 2013   Dr Ellison Hughs  . CATARACT EXTRACTION Left 04/2016  . COLONOSCOPY    . COLONOSCOPY WITH ESOPHAGOGASTRODUODENOSCOPY (EGD) AND ESOPHAGEAL DILATION (ED)    . EYE SURGERY     scar tissue removed after cataract surgery  . 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 KNEE ARTHROSCOPY Right 1994   x 3  . KNEE SURGERY Right 01/21/2006  . LUMBAR LAMINECTOMY/DECOMPRESSION MICRODISCECTOMY Right 02/11/2015   Procedure: Laminectomy and Foraminotomy - Right - L3-L4;  Surgeon: Earnie Larsson, MD;  Location: [facility];  Service: Neurosurgery;  Laterality: Right;  Laminectomy and Foraminotomy - Right - L3-L4  . mallet finger Right 2011  . NASAL SEPTUM SURGERY  1970  . NISSEN FUNDOPLICATION  8938  . RETINAL LASER PROCEDURE Right 02/01/2011   right eye   . TONSILLECTOMY  1981  . TOTAL KNEE ARTHROPLASTY Right 09/06/2016   Procedure: RIGHT TOTAL KNEE ARTHROPLASTY;  Surgeon: Gaynelle Arabian, MD;  Location: [facility];  Service: Orthopedics;  Laterality: Right;  . TRIGGER FINGER RELEASE Bilateral 2005  . VARICOSE VEIN SURGERY Bilateral 2007, 2009   left 2007, right 2009  . VITRECTOMY Right 2012    Family history: Family History  Problem Relation Age of Onset  . Breast cancer Mother   . Arthritis Mother   . Hyperlipidemia Mother   . Hypertension Mother   . Allergic rhinitis Mother   . Urticaria Mother   . Lung cancer Father   . Pancreatic cancer Father   . Skin cancer Father   . Arthritis Father   . Allergic rhinitis Father   . Emphysema Father   . Lymphoma Brother   . Celiac disease Brother   . Hypertension Brother   . Thyroid disease Brother   . Allergic rhinitis Brother   . Hyperlipidemia Maternal Grandfather   . Heart disease Maternal Grandfather   . Stroke Maternal Grandfather   . Eczema Maternal Aunt   . Asthma  Neg Hx   . Immunodeficiency Neg Hx   . Angioedema Neg Hx     Social history: Social History   Socioeconomic History  . Marital status: Married    Spouse name: Not on file  . Number of children: 0  . Years of education: Not on file  . Highest education level: Not on file  Occupational History  . Occupation: Hydrologist: [employer]  . Financial resource strain: Not on file  . Food insecurity:    Worry: Not on file    Inability: Not on file  . Transportation needs:    Medical: Not on file    Non-medical: Not on file  Tobacco  Use  . Smoking status: Never Smoker  . Smokeless tobacco: Never Used  Substance and Sexual Activity  . Alcohol use: Yes    Alcohol/week: 0.0 oz    Comment: rarely  . Drug use: No  . Sexual activity: Never    Birth control/protection: Surgical  Lifestyle  . Physical activity:    Days per week: Not on file    Minutes per session: Not on file  . Stress: Not on file  Relationships  . Social connections:    Talks on phone: Not on file    Gets together: Not on file    Attends religious service: Not on file    Active member of club or organization: Not on file    Attends meetings of clubs or organizations: Not on file    Relationship status: Not on file  . Intimate partner violence:    Fear of current or ex partner: Not on file    Emotionally abused: Not on file    Physically abused: Not on file    Forced sexual activity: Not on file  Other Topics Concern  . Not on file  Social History Narrative   Works as an Glass blower/designer   Married- husband is 33 years older than her   Former Dance movement psychotherapist up up [employer]   Has cats   Enjoys reading   No children   Environmental History: The patient lives in a [address] year old townhouse with laminate floors throughout, gas heat, and central air.  There are 2 cats in the home which have access to her bedroom.  There is no known mold/water damage in the home.  She is a  non-smoker.  Allergies as of 02/01/2018      Reactions   Aleve [naproxen Sodium]    Swelling, itching   Cefuroxime Axetil    Hives / "throat swelling"   Chlorzoxazone Rash, Other (See Comments)   "breathing problems"    Oxycodone    Hallucinations, rash   Penicillins Anaphylaxis, Hives   Has patient had a PCN reaction causing immediate rash, facial/tongue/throat swelling, SOB or lightheadedness with hypotension: Yes Has patient had a PCN reaction causing severe rash involving mucus membranes or skin necrosis: No Has patient had a PCN reaction that required hospitalization No Has patient had a PCN reaction occurring within the last 10 years: No If all of the above answers are "NO", then may proceed with Cephalosporin use.   Adhesive [tape]    rash   Cataflam [diclofenac Potassium]    Lip swelling   Clarithromycin Diarrhea   Flagyl [metronidazole Hcl]    ?diarrhea   Glucosamine Hives   Guaifenesin & Derivatives    Stomach upset   Hydrocodone-acetaminophen    REACTION: rash, tightening of throat   Ketorolac Tromethamine    ?reaction   Pentazocine    Involuntary twitching   Pneumovax [pneumococcal Polysaccharide Vaccine] Other (See Comments)   Redness/swelling at site, nausea   Prevnar [pneumococcal 13-val Conj Vacc] Other (See Comments)   Swelling, redness, nausea   Smz-tmp Ds [sulfamethoxazole W/trimethoprim (co-trimoxazole)]    ?unknown reaction   Tramadol    constipation   Trazodone And Nefazodone Other (See Comments)   Scratchy throat / congestion   Latex Rash      Medication List        Accurate as of 02/01/18  5:54 PM. Always use your most recent med list.          aspirin 81 MG tablet Take 81 mg  by mouth daily.   Azelaic Acid 15 % cream Commonly known as:  FINACEA Apply small amount to clean dry skin twice daily.   azelastine 0.1 % nasal spray Commonly known as:  ASTELIN Place 1-2 sprays into both nostrils 2 (two) times daily as needed.   benzoyl  peroxide 5 % gel Apply 1 application topically 2 (two) times daily.   CALCIUM CITRATE PO Take 1 tablet by mouth daily.   cholecalciferol 1000 units tablet Commonly known as:  VITAMIN D Take 1 tablet (1,000 Units total) by mouth 2 (two) times daily.   clotrimazole-betamethasone cream Commonly known as:  LOTRISONE Apply 1 application topically 2 (two) times daily.   DULoxetine 60 MG capsule Commonly known as:  CYMBALTA TAKE 1 CAPSULE DAILY   EPINEPHrine 0.3 mg/0.3 mL Soaj injection Commonly known as:  EPIPEN 2-PAK Inject 0.3 mLs (0.3 mg total) into the muscle once.   fexofenadine 180 MG tablet Commonly known as:  ALLEGRA Take 1 tablet (180 mg total) by mouth daily as needed for allergies or rhinitis.   FIBER PO Take 2 capsules by mouth 2 (two) times daily.   fluticasone 50 MCG/ACT nasal spray Commonly known as:  FLONASE Place 1 spray into both nostrils daily.   loratadine 10 MG tablet Commonly known as:  CLARITIN Take 10 mg by mouth daily as needed for allergies.   multivitamin tablet Take 1 tablet by mouth daily.   pravastatin 40 MG tablet Commonly known as:  PRAVACHOL TAKE 1 TABLET DAILY   PROBIOTIC DAILY PO Take 1 capsule by mouth daily.   ranitidine 150 MG capsule Commonly known as:  ZANTAC Take 1 capsule (150 mg total) by mouth 2 (two) times daily as needed for heartburn.   vitamin C 500 MG tablet Commonly known as:  ASCORBIC ACID Take 500 mg by mouth daily.   VOLTAREN 1 % Gel Generic drug:  diclofenac sodium Apply 1 application topically daily as needed.       Known medication allergies: Allergies  Allergen Reactions  . Aleve [Naproxen Sodium]     Swelling, itching  . Cefuroxime Axetil     Hives / "throat swelling"  . Chlorzoxazone Rash and Other (See Comments)    "breathing problems"   . Oxycodone     Hallucinations, rash  . Penicillins Anaphylaxis and Hives    Has patient had a PCN reaction causing immediate rash, facial/tongue/throat  swelling, SOB or lightheadedness with hypotension: Yes Has patient had a PCN reaction causing severe rash involving mucus membranes or skin necrosis: No Has patient had a PCN reaction that required hospitalization No Has patient had a PCN reaction occurring within the last 10 years: No If all of the above answers are "NO", then may proceed with Cephalosporin use.   . Adhesive [Tape]     rash  . Cataflam [Diclofenac Potassium]     Lip swelling  . Clarithromycin Diarrhea  . Flagyl [Metronidazole Hcl]     ?diarrhea  . Glucosamine Hives  . Guaifenesin & Derivatives     Stomach upset  . Hydrocodone-Acetaminophen     REACTION: rash, tightening of throat  . Ketorolac Tromethamine     ?reaction  . Pentazocine     Involuntary twitching  . Pneumovax [Pneumococcal Polysaccharide Vaccine] Other (See Comments)    Redness/swelling at site, nausea  . Prevnar [Pneumococcal 13-Val Conj Vacc] Other (See Comments)    Swelling, redness, nausea  . Smz-Tmp Ds [Sulfamethoxazole W/Trimethoprim (Co-Trimoxazole)]     ?unknown reaction  .  Tramadol     constipation  . Trazodone And Nefazodone Other (See Comments)    Scratchy throat / congestion  . Latex Rash    I appreciate the opportunity to take part in Vanessa Oliver's care. Please do not hesitate to contact me with questions.  Sincerely,   R. Edgar Frisk, MD

## 2018-02-01 NOTE — Assessment & Plan Note (Signed)
   Reflux lifestyle modifications have been discussed and provided in written form.  Continue proton pump inhibitor as previously prescribed.  A prescription has been provided for ranitidine 150 mg twice daily.  Make note of any foods associated with symptoms and avoid these foods if possible.

## 2018-02-01 NOTE — Patient Instructions (Addendum)
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 Skin tests to select food allergens were negative today.  She has had episodes of urticaria in the past, typically when exposed to heat during the summertime.  She had extensive lab work to evaluate her urticaria in 2016 with all results being negative/within normal limits.  NSAIDs and emotional stress commonly exacerbate urticaria but are not the underlying etiology in this case. There are no concomitant symptoms concerning for anaphylaxis or constitutional symptoms worrisome for an underlying malignancy.   We will not order labs at this time, however, if lesions recur, persist, progress, or change in character, we will assess potential etiologies with screening labs.  For symptom relief, patient is to take oral antihistamines as directed.  Instructions have been discussed and provided for H1/H2 receptor blockade with titration to find lowest effective dose.  A prescription has been provided for fexofenadine (Allegra) 180 mg as needed.  A prescription has been provided for ranitidine (Zantac) 150 mg twice daily as needed.  Should symptoms recur, a journal is to be kept recording any foods eaten, beverages consumed, medications taken within a 6 hour period prior to the onset of symptoms, as well as record activities being performed, and environmental conditions. For any symptoms concerning for anaphylaxis, 911 is to be called immediately.  History of food allergy Metallic taste in the mouth is not a common symptom associated with IgE mediated food allergies, however is commonly associated with acid reflux.  Treatment plan as outlined below for GERD.  GERD  Reflux lifestyle modifications have been discussed and provided in written form.  Continue proton pump inhibitor as previously prescribed.  A prescription has been provided for ranitidine 150 mg twice daily.  Make note of any foods associated  with symptoms and avoid these foods if possible.  Allergic rhinitis with a probable nonallergic component Aeroallergen avoidance measures have been discussed and provided in written form.  Fexofenadine (Allegra) or Claritin (loratadine) as needed.  A prescription has been provided for azelastine nasal spray, 1-2 sprays per nostril 2 times daily as needed. Proper nasal spray technique has been discussed and demonstrated.   Nasal saline spray (i.e., Simply Saline) or nasal saline lavage (i.e., NeilMed) is recommended as needed and prior to medicated nasal sprays.   Return in about 6 months (around 08/04/2018), or if symptoms worsen or fail to improve.   Urticaria (Hives)  . Fexofenadine (Allegra) 180 mg once a day.  If symptoms continue then increase to .  Marland Kitchen Fexofenadine (Allegra) 180 mg  twice a day.  If symptoms continue then increase to .  Marland Kitchen Fexofenadine (Allegra) 180 mg  twice a day and Ranitidine (Zantac) 150 mg once a day.  If symptoms continue then increase to.  Marland Kitchen Fexofenadine (Allegra) 180 mg  twice a day and Ranitidine (Zantac) 150 mg twice a day  May use Benadryl as needed for breakthrough symptoms       If no symptoms for 7 days, then step down dosage   Lifestyle Changes for Controlling GERD  When you have GERD, stomach acid feels as if it's backing up toward your mouth. Whether or not you take medication to control your GERD, your symptoms can often be improved with lifestyle changes.   Raise Your Head  Reflux is more likely to strike when you're lying down flat, because stomach fluid can  flow backward more easily. Raising the head of your bed 4-6 inches can help. To do this:  Slide blocks  or books under the legs at the head of your bed. Or, place a wedge under  the mattress. Many foam stores can make a suitable wedge for you. The wedge  should run from your waist to the top of your head.  Don't just prop your head on several pillows. This increases pressure  on your  stomach. It can make GERD worse.  Watch Your Eating Habits Certain foods may increase the acid in your stomach or relax the lower esophageal sphincter, making GERD more likely. It's best to avoid the following:  Coffee, tea, and carbonated drinks (with and without caffeine)  Fatty, fried, or spicy food  Mint, chocolate, onions, and tomatoes  Any other foods that seem to irritate your stomach or cause you pain  Relieve the Pressure  Eat smaller meals, even if you have to eat more often.  Don't lie down right after you eat. Wait a few hours for your stomach to empty.  Avoid tight belts and tight-fitting clothes.  Lose excess weight.  Tobacco and Alcohol  Avoid smoking tobacco and drinking alcohol. They can make GERD symptoms worse.   Reducing Pollen Exposure  The American Academy of Allergy, Asthma and Immunology suggests the following steps to reduce your exposure to pollen during allergy seasons.    1. Do not hang sheets or clothing out to dry; pollen may collect on these items. 2. Do not mow lawns or spend time around freshly cut grass; mowing stirs up pollen. 3. Keep windows closed at night.  Keep car windows closed while driving. 4. Minimize morning activities outdoors, a time when pollen counts are usually at their highest. 5. Stay indoors as much as possible when pollen counts or humidity is high and on windy days when pollen tends to remain in the air longer. 6. Use air conditioning when possible.  Many air conditioners have filters that trap the pollen spores. 7. Use a HEPA room air filter to remove pollen form the indoor air you breathe.

## 2018-02-01 NOTE — Assessment & Plan Note (Addendum)
Aeroallergen avoidance measures have been discussed and provided in written form.  Fexofenadine (Allegra) or Claritin (loratadine) as needed.  A prescription has been provided for azelastine nasal spray, 1-2 sprays per nostril 2 times daily as needed. Proper nasal spray technique has been discussed and demonstrated.   Nasal saline spray (i.e., Simply Saline) or nasal saline lavage (i.e., NeilMed) is recommended as needed and prior to medicated nasal sprays.

## 2018-02-05 ENCOUNTER — Encounter: Payer: Self-pay | Admitting: Family

## 2018-02-26 ENCOUNTER — Other Ambulatory Visit: Payer: Self-pay | Admitting: Family

## 2018-02-27 NOTE — Telephone Encounter (Signed)
Due to contraindication warning for allergies with Cataflam and Naproxen, verified with pt that she has never had any side effects or adverse reactions from using Diclofenac gel.  Refill sent for Diclofenac gel.

## 2018-03-09 ENCOUNTER — Other Ambulatory Visit: Payer: Self-pay | Admitting: *Deleted

## 2018-03-09 MED ORDER — RANITIDINE HCL 150 MG PO CAPS: 150.0000 mg | ORAL_CAPSULE | Freq: Two times a day (BID) | ORAL | 3 refills | Status: DC | PRN

## 2018-03-09 MED ORDER — FEXOFENADINE HCL 180 MG PO TABS: 180.0000 mg | ORAL_TABLET | Freq: Every day | ORAL | 3 refills | Status: DC | PRN

## 2018-03-09 MED ORDER — AZELASTINE HCL 0.1 % NA SOLN: 1.0000 | Freq: Two times a day (BID) | NASAL | 3 refills | Status: DC | PRN

## 2018-03-13 DIAGNOSIS — F4310 Post-traumatic stress disorder, unspecified: Secondary | ICD-10-CM | POA: Diagnosis not present

## 2018-03-14 ENCOUNTER — Ambulatory Visit (INDEPENDENT_AMBULATORY_CARE_PROVIDER_SITE_OTHER): Payer: Medicare Other

## 2018-03-14 DIAGNOSIS — Z23 Encounter for immunization: Secondary | ICD-10-CM

## 2018-03-16 ENCOUNTER — Encounter: Payer: Self-pay | Admitting: Family

## 2018-03-20 ENCOUNTER — Ambulatory Visit (AMBULATORY_SURGERY_CENTER): Payer: Self-pay

## 2018-03-20 VITALS — Ht 64.0 in | Wt 163.8 lb

## 2018-03-20 DIAGNOSIS — R1310 Dysphagia, unspecified: Secondary | ICD-10-CM

## 2018-03-20 DIAGNOSIS — Z1211 Encounter for screening for malignant neoplasm of colon: Secondary | ICD-10-CM

## 2018-03-20 MED ORDER — NA SULFATE-K SULFATE-MG SULF 17.5-3.13-1.6 GM/177ML PO SOLN: 1.0000 | Freq: Once | ORAL | 0 refills | Status: AC

## 2018-03-20 NOTE — Progress Notes (Signed)
No egg or soy allergy known to patient  No issues with past sedation with any surgeries  or procedures, no intubation problems  No diet pills per patient No home 02 use per patient  No blood thinners per patient  Pt denies issues with constipation  No A fib or A flutter  EMMI video sent to pt's e mail  Pt. declined 

## 2018-03-21 ENCOUNTER — Ambulatory Visit (INDEPENDENT_AMBULATORY_CARE_PROVIDER_SITE_OTHER): Payer: Medicare Other | Admitting: Family

## 2018-03-21 ENCOUNTER — Encounter: Payer: Self-pay | Admitting: Family

## 2018-03-21 ENCOUNTER — Other Ambulatory Visit: Payer: Self-pay | Admitting: Family

## 2018-03-21 VITALS — BP 115/70 | HR 75 | Temp 98.2°F | Resp 16 | Ht 64.0 in | Wt 163.0 lb

## 2018-03-21 DIAGNOSIS — R5383 Other fatigue: Secondary | ICD-10-CM | POA: Diagnosis not present

## 2018-03-21 DIAGNOSIS — Z1239 Encounter for other screening for malignant neoplasm of breast: Secondary | ICD-10-CM

## 2018-03-21 DIAGNOSIS — Z23 Encounter for immunization: Secondary | ICD-10-CM

## 2018-03-21 LAB — CBC WITH DIFFERENTIAL/PLATELET
BASOS ABS: 0.1 10*3/uL (ref 0.0–0.1)
Basophils Relative: 1.1 % (ref 0.0–3.0)
EOS PCT: 1.5 % (ref 0.0–5.0)
Eosinophils Absolute: 0.1 10*3/uL (ref 0.0–0.7)
HCT: 44 % (ref 36.0–46.0)
Hemoglobin: 15.1 g/dL — ABNORMAL HIGH (ref 12.0–15.0)
Lymphocytes Relative: 37.7 % (ref 12.0–46.0)
Lymphs Abs: 2.4 10*3/uL (ref 0.7–4.0)
MCHC: 34.4 g/dL (ref 30.0–36.0)
MCV: 91.3 fl (ref 78.0–100.0)
MONO ABS: 0.5 10*3/uL (ref 0.1–1.0)
MONOS PCT: 8.3 % (ref 3.0–12.0)
NEUTROS PCT: 51.4 % (ref 43.0–77.0)
Neutro Abs: 3.2 10*3/uL (ref 1.4–7.7)
Platelets: 302 10*3/uL (ref 150.0–400.0)
RBC: 4.83 Mil/uL (ref 3.87–5.11)
RDW: 13.1 % (ref 11.5–15.5)
WBC: 6.3 10*3/uL (ref 4.0–10.5)

## 2018-03-21 LAB — COMPREHENSIVE METABOLIC PANEL
ALT: 12 U/L (ref 0–35)
AST: 13 U/L (ref 0–37)
Albumin: 4.5 g/dL (ref 3.5–5.2)
Alkaline Phosphatase: 70 U/L (ref 39–117)
BUN: 21 mg/dL (ref 6–23)
CO2: 28 meq/L (ref 19–32)
Calcium: 9.9 mg/dL (ref 8.4–10.5)
Chloride: 102 mEq/L (ref 96–112)
Creatinine, Ser: 0.69 mg/dL (ref 0.40–1.20)
GFR: 89.73 mL/min (ref >60.00)
GLUCOSE: 79 mg/dL (ref 70–99)
POTASSIUM: 4.6 meq/L (ref 3.5–5.1)
Sodium: 138 mEq/L (ref 135–145)
Total Bilirubin: 0.8 mg/dL (ref 0.2–1.2)
Total Protein: 6.8 g/dL (ref 6.0–8.3)

## 2018-03-21 LAB — TSH: TSH: 1.72 u[IU]/mL (ref 0.35–4.50)

## 2018-03-21 NOTE — Patient Instructions (Addendum)
Please schedule a follow up with Dr. Elsworth Soho.  Complete lab work prior to leaving.

## 2018-03-21 NOTE — Progress Notes (Signed)
Subjective:    Patient ID: Vanessa Oliver, female    DOB: [birth year], 67 y.o.   MRN: [id number]  HPI  Patient is a 67 year old female who presents today with chief complaint of fatigue. Reports that she gets up in the AM's and she "already feels tired."  She reports good compliance with her cpap. This is being managed by her sleep specialist (Dr. Elsworth Soho).    Depression- she is maintained on Cymbalta 60 mg once daily.reports that she finally found a Social worker. [facility]. She has had one meeting so far but really liked her.      Review of Systems See HPI  Past Medical History:  Diagnosis Date  . Allergy    takes Singulair at night  . Anxiety   . Arthritis   . Cataract   . Chronic back pain    stenosis  . Depression    takes CYmbalta daily  . Esophageal stricture   . Family history of adverse reaction to anesthesia    oldest brother had trouble with anesthesia a long time ago but can't recall what  . Fibromyalgia   . Fibromyalgia   . GERD (gastroesophageal reflux disease)    takes Omeprazole daily  . Hemorrhoids   . History of bronchitis   . Hyperlipidemia   . IBS (irritable bowel syndrome)   . Joint pain   . Joint swelling   . Plantar fasciitis   . Rosacea   . Rosacea   . Sleep apnea    cpap- settings at 10 breathing   . TMJ (dislocation of temporomandibular joint)   . TMJ (temporomandibular joint disorder)   . Vasovagal episode back in the 80's  . Vasovagal syncope   . Weakness    right leg     Social History   Socioeconomic History  . Marital status: Married    Spouse name: Not on file  . Number of children: 0  . Years of education: Not on file  . Highest education level: Not on file  Occupational History  . Occupation: Hydrologist: [employer]  . Financial resource strain: Not on file  . Food insecurity:    Worry: Not on file    Inability: Not on file  . Transportation needs:    Medical: Not on file   Non-medical: Not on file  Tobacco Use  . Smoking status: Never Smoker  . Smokeless tobacco: Never Used  Substance and Sexual Activity  . Alcohol use: Yes    Alcohol/week: 0.0 standard drinks    Comment: rarely  . Drug use: No  . Sexual activity: Never    Birth control/protection: Surgical  Lifestyle  . Physical activity:    Days per week: Not on file    Minutes per session: Not on file  . Stress: Not on file  Relationships  . Social connections:    Talks on phone: Not on file    Gets together: Not on file    Attends religious service: Not on file    Active member of club or organization: Not on file    Attends meetings of clubs or organizations: Not on file    Relationship status: Not on file  . Intimate partner violence:    Fear of current or ex partner: Not on file    Emotionally abused: Not on file    Physically abused: Not on file    Forced sexual activity: Not on file  Other Topics  Concern  . Not on file  Social History Narrative   Works as an Glass blower/designer   Married- husband is 56 years older than her   Former Dance movement psychotherapist up up [employer]   Has cats   Enjoys reading   No children    Past Surgical History:  Procedure Laterality Date  . ABDOMINAL HYSTERECTOMY  1997  . CARPAL TUNNEL RELEASE Bilateral 2004  . CARPOMETACARPAL (CMC) FUSION OF THUMB  2015   thumb  . CATARACT EXTRACTION Right 2013   Dr Ellison Hughs  . CATARACT EXTRACTION Left 04/2016  . COLONOSCOPY    . COLONOSCOPY WITH ESOPHAGOGASTRODUODENOSCOPY (EGD) AND ESOPHAGEAL DILATION (ED)    . EYE SURGERY     scar tissue removed after cataract surgery  . 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 KNEE ARTHROSCOPY Right 1994   x 3  . KNEE SURGERY Right 01/21/2006  . LUMBAR LAMINECTOMY/DECOMPRESSION MICRODISCECTOMY Right 02/11/2015   Procedure: Laminectomy and Foraminotomy - Right - L3-L4;  Surgeon: Earnie Larsson, MD;  Location: [facility];  Service: Neurosurgery;  Laterality: Right;  Laminectomy and  Foraminotomy - Right - L3-L4  . mallet finger Right 2011  . NASAL SEPTUM SURGERY  1970  . NISSEN FUNDOPLICATION  4098  . RETINAL LASER PROCEDURE Right 02/01/2011   right eye   . TONSILLECTOMY  1981  . TOTAL KNEE ARTHROPLASTY Right 09/06/2016   Procedure: RIGHT TOTAL KNEE ARTHROPLASTY;  Surgeon: Gaynelle Arabian, MD;  Location: [facility];  Service: Orthopedics;  Laterality: Right;  . TRIGGER FINGER RELEASE Bilateral 2005  . UPPER GASTROINTESTINAL ENDOSCOPY    . VARICOSE VEIN SURGERY Bilateral 2007, 2009   left 2007, right 2009  . VITRECTOMY Right 2012    Family History  Problem Relation Age of Onset  . Breast cancer Mother   . Arthritis Mother   . Hyperlipidemia Mother   . Hypertension Mother   . Allergic rhinitis Mother   . Urticaria Mother   . Lung cancer Father   . Pancreatic cancer Father   . Skin cancer Father   . Arthritis Father   . Allergic rhinitis Father   . Emphysema Father   . Lymphoma Brother   . Celiac disease Brother   . Hypertension Brother   . Thyroid disease Brother   . Allergic rhinitis Brother   . Hyperlipidemia Maternal Grandfather   . Heart disease Maternal Grandfather   . Stroke Maternal Grandfather   . Eczema Maternal Aunt   . Asthma Neg Hx   . Immunodeficiency Neg Hx   . Angioedema Neg Hx   . Colon cancer Neg Hx   . Colon polyps Neg Hx   . Esophageal cancer Neg Hx   . Rectal cancer Neg Hx   . Stomach cancer Neg Hx     Allergies  Allergen Reactions  . Aleve [Naproxen Sodium]     Swelling, itching  . Cefuroxime Axetil     Hives / "throat swelling"  . Chlorzoxazone Rash and Other (See Comments)    "breathing problems"   . Oxycodone     Hallucinations, rash  . Penicillins Anaphylaxis and Hives    Has patient had a PCN reaction causing immediate rash, facial/tongue/throat swelling, SOB or lightheadedness with hypotension: Yes Has patient had a PCN reaction causing severe rash involving mucus membranes or skin necrosis: No Has patient had a PCN  reaction that required hospitalization No Has patient had a PCN reaction occurring within the last 10 years: No  If all of the above answers are "NO", then may proceed with Cephalosporin use.   . Adhesive [Tape]     rash  . Cataflam [Diclofenac Potassium]     Lip swelling  . Clarithromycin Diarrhea  . Flagyl [Metronidazole Hcl]     ?diarrhea  . Fluzone [Flu Virus Vaccine]     Had local redness with high dose.   . Glucosamine Hives  . Guaifenesin & Derivatives     Stomach upset  . Hydrocodone-Acetaminophen     REACTION: rash, tightening of throat  . Ketorolac Tromethamine     ?reaction  . Pentazocine     Involuntary twitching  . Pneumovax [Pneumococcal Polysaccharide Vaccine] Other (See Comments)    Redness/swelling at site, nausea  . Prevnar [Pneumococcal 13-Val Conj Vacc] Other (See Comments)    Swelling, redness, nausea  . Smz-Tmp Ds [Sulfamethoxazole W/Trimethoprim (Co-Trimoxazole)]     ?unknown reaction  . Tramadol     constipation  . Trazodone And Nefazodone Other (See Comments)    Scratchy throat / congestion  . Latex Rash    Current Outpatient Medications on File Prior to Visit  Medication Sig Dispense Refill  . aspirin 81 MG tablet Take 81 mg by mouth daily.    . Azelaic Acid (FINACEA) 15 % cream Apply small amount to clean dry skin twice daily. 90 g 3  . azelastine (ASTELIN) 0.1 % nasal spray Place 1-2 sprays into both nostrils 2 (two) times daily as needed. 90 mL 3  . benzoyl peroxide 5 % gel Apply 1 application topically 2 (two) times daily.     Marland Kitchen CALCIUM CITRATE PO Take 1 tablet by mouth daily.    . cholecalciferol (VITAMIN D) 1000 units tablet Take 1 tablet (1,000 Units total) by mouth 2 (two) times daily.    . clotrimazole-betamethasone (LOTRISONE) cream Apply 1 application topically 2 (two) times daily. 15 g 0  . Coenzyme Q10 200 MG capsule Take 200 mg by mouth daily.    . diclofenac sodium (VOLTAREN) 1 % GEL APPLY 2 GRAMS TOPICALLY TWICE A DAY AS NEEDED 300  g 0  . DULoxetine (CYMBALTA) 60 MG capsule TAKE 1 CAPSULE DAILY 90 capsule 1  . EPINEPHrine (EPIPEN 2-PAK) 0.3 mg/0.3 mL IJ SOAJ injection Inject 0.3 mLs (0.3 mg total) into the muscle once. 1 Device 1  . fexofenadine (ALLEGRA) 180 MG tablet Take 1 tablet (180 mg total) by mouth daily as needed for allergies or rhinitis. 90 tablet 3  . FIBER PO Take 2 capsules by mouth 2 (two) times daily.    . fluticasone (FLONASE) 50 MCG/ACT nasal spray Place 1 spray into both nostrils daily. 48 g 1  . loratadine (CLARITIN) 10 MG tablet Take 10 mg by mouth daily as needed for allergies.    . Multiple Vitamin (MULTIVITAMIN) tablet Take 1 tablet by mouth daily.    Marland Kitchen omeprazole (PRILOSEC) 40 MG capsule omeprazole 40 mg capsule,delayed release    . pravastatin (PRAVACHOL) 40 MG tablet TAKE 1 TABLET DAILY 90 tablet 1  . Probiotic Product (PROBIOTIC DAILY PO) Take 1 capsule by mouth daily.    . ranitidine (ZANTAC) 150 MG capsule Take 1 capsule (150 mg total) by mouth 2 (two) times daily as needed for heartburn. 180 capsule 3  . vitamin C (ASCORBIC ACID) 500 MG tablet Take 500 mg by mouth daily.    . VOLTAREN 1 % GEL Apply 1 application topically daily as needed.     No current facility-administered medications on file prior  to visit.     BP 115/70 (BP Location: Right Arm, Patient Position: Sitting, Cuff Size: Small)   Pulse 75   Temp 98.2 F (36.8 C) (Oral)   Resp 16   Ht 5\' 4"  (1.626 m)   Wt 163 lb (73.9 kg)   LMP 05/14/1996   SpO2 100%   BMI 27.98 kg/m       Objective:   Physical Exam  Constitutional: She appears well-developed and well-nourished.  Cardiovascular: Normal rate, regular rhythm and normal heart sounds.  No murmur heard. Pulmonary/Chest: Effort normal and breath sounds normal. No respiratory distress. She has no wheezes.  Skin: Skin is warm and dry. No erythema.  Psychiatric: She has a normal mood and affect. Her behavior is normal. Judgment and thought content normal.            Assessment & Plan:  Fatigue- advised the patient to schedule follow-up with her sleep specialist as she is overdue for this.  It is certainly possible that her CPAP settings need to be adjusted.  I will also obtain a CBC, complete metabolic panel, and a TSH to further evaluate her fatigue.  Depression- fair control.  She continue Cymbalta.  Has just recently started work with a therapist which I think will be extremely helpful for her.  She is taking a lot of the home responsibilities which I also think is wearing her out.  Her husband's health has been declining and she is a caregiver for her mother.  She reports she had some local redness following flu shot on 9/3 but redness is no longer present today.

## 2018-03-28 ENCOUNTER — Encounter: Payer: Self-pay | Admitting: Family

## 2018-03-29 ENCOUNTER — Encounter: Payer: Self-pay | Admitting: Family

## 2018-03-29 DIAGNOSIS — M1812 Unilateral primary osteoarthritis of first carpometacarpal joint, left hand: Secondary | ICD-10-CM | POA: Diagnosis not present

## 2018-03-30 DIAGNOSIS — F4310 Post-traumatic stress disorder, unspecified: Secondary | ICD-10-CM | POA: Diagnosis not present

## 2018-04-03 ENCOUNTER — Encounter: Payer: Self-pay | Admitting: Internal Medicine

## 2018-04-03 ENCOUNTER — Ambulatory Visit (AMBULATORY_SURGERY_CENTER): Payer: Medicare Other | Admitting: Internal Medicine

## 2018-04-03 VITALS — BP 121/64 | HR 69 | Temp 97.1°F | Resp 10 | Ht 64.0 in | Wt 163.0 lb

## 2018-04-03 DIAGNOSIS — R1310 Dysphagia, unspecified: Secondary | ICD-10-CM

## 2018-04-03 DIAGNOSIS — K219 Gastro-esophageal reflux disease without esophagitis: Secondary | ICD-10-CM

## 2018-04-03 DIAGNOSIS — K222 Esophageal obstruction: Secondary | ICD-10-CM | POA: Diagnosis not present

## 2018-04-03 DIAGNOSIS — D122 Benign neoplasm of ascending colon: Secondary | ICD-10-CM | POA: Diagnosis not present

## 2018-04-03 DIAGNOSIS — Z1211 Encounter for screening for malignant neoplasm of colon: Secondary | ICD-10-CM | POA: Diagnosis not present

## 2018-04-03 DIAGNOSIS — G4733 Obstructive sleep apnea (adult) (pediatric): Secondary | ICD-10-CM | POA: Diagnosis not present

## 2018-04-03 MED ORDER — SODIUM CHLORIDE 0.9 % IV SOLN: 500.0000 mL | Freq: Once | INTRAVENOUS | Status: DC

## 2018-04-03 NOTE — Op Note (Signed)
[facility] Patient Name: Vanessa Oliver Procedure Date: 04/03/2018 7:47 AM MRN: [id number] Endoscopist: Docia Chuck. Henrene Pastor , MD Age: 67 Referring MD:  Date of Birth: [birth year] Gender: Female Account #: [id number] Procedure:                Colonoscopy, with cold snare polypectomy x 1 Indications:              Screening for colorectal malignant neoplasm                            Previous examination 2009 was negative for neoplasia Medicines:                Monitored Anesthesia Care Procedure:                Pre-Anesthesia Assessment:                           - Prior to the procedure, a History and Physical                            was performed, and patient medications and                            allergies were reviewed. The patient's tolerance of                            previous anesthesia was also reviewed. The risks                            and benefits of the procedure and the sedation                            options and risks were discussed with the patient.                            All questions were answered, and informed consent                            was obtained. Prior Anticoagulants: The patient has                            taken no previous anticoagulant or antiplatelet                            agents. ASA Grade Assessment: II - A patient with                            mild systemic disease. After reviewing the risks                            and benefits, the patient was deemed in                            satisfactory condition to undergo the procedure.  After obtaining informed consent, the colonoscope                            was passed under direct vision. Throughout the                            procedure, the patient's blood pressure, pulse, and                            oxygen saturations were monitored continuously. The                            Colonoscope was introduced through the anus and                     advanced to the the cecum, identified by                            appendiceal orifice and ileocecal valve. The                            ileocecal valve, appendiceal orifice, and rectum                            were photographed. The quality of the bowel                            preparation was excellent. The colonoscopy was                            performed without difficulty. The patient tolerated                            the procedure well. The bowel preparation used was                            SUPREP. Scope In: 8:24:05 AM Scope Out: 8:41:16 AM Scope Withdrawal Time: 0 hours 12 minutes 45 seconds  Total Procedure Duration: 0 hours 17 minutes 11 seconds  Findings:                 A 4 mm polyp was found in the ascending colon. The                            polyp was removed with a cold snare. Resection and                            retrieval were complete.                           A few diverticula were found in the sigmoid colon.                           Internal hemorrhoids were found during retroflexion.  The exam was otherwise without abnormality on                            direct and retroflexion views. Complications:            No immediate complications. Estimated blood loss:                            None. Estimated Blood Loss:     Estimated blood loss: none. Impression:               - One 4 mm polyp in the ascending colon, removed                            with a cold snare. Resected and retrieved.                           - Diverticulosis in the sigmoid colon.                           - Internal hemorrhoids.                           - The examination was otherwise normal on direct                            and retroflexion views. Recommendation:           - Repeat colonoscopy in 5 or 10 years for                            surveillance, pending final pathology results.                           - Patient  has a contact number available for                            emergencies. The signs and symptoms of potential                            delayed complications were discussed with the                            patient. Return to normal activities tomorrow.                            Written discharge instructions were provided to the                            patient.                           - Resume previous diet.                           - Continue present medications.                           -  Await pathology results. Docia Chuck. Henrene Pastor, MD 04/03/2018 8:52:59 AM This report has been signed electronically.

## 2018-04-03 NOTE — Progress Notes (Signed)
To recovery, report to RN, VSS. 

## 2018-04-03 NOTE — Op Note (Signed)
[facility] Patient Name: Vanessa Oliver Procedure Date: 04/03/2018 7:46 AM MRN: [id number] Endoscopist: Docia Chuck. Henrene Pastor , MD Age: 67 Referring MD:  Date of Birth: 11-Jul-1951 Gender: Female Account #: [id number] Procedure:                Upper GI endoscopy, with Venia Minks dilation of the                            esophagus?"40f Indications:              Dysphagia, Epigastric abdominal pain Medicines:                Monitored Anesthesia Care Procedure:                Pre-Anesthesia Assessment:                           - Prior to the procedure, a History and Physical                            was performed, and patient medications and                            allergies were reviewed. The patient's tolerance of                            previous anesthesia was also reviewed. The risks                            and benefits of the procedure and the sedation                            options and risks were discussed with the patient.                            All questions were answered, and informed consent                            was obtained. Prior Anticoagulants: The patient has                            taken no previous anticoagulant or antiplatelet                            agents. ASA Grade Assessment: II - A patient with                            mild systemic disease. After reviewing the risks                            and benefits, the patient was deemed in                            satisfactory condition to undergo the procedure.  After obtaining informed consent, the endoscope was                            passed under direct vision. Throughout the                            procedure, the patient's blood pressure, pulse, and                            oxygen saturations were monitored continuously. The                            Model GIF-HQ190 [id number]) scope was introduced                            through the mouth,  and advanced to the second part                            of duodenum. The upper GI endoscopy was                            accomplished without difficulty. The patient                            tolerated the procedure well. Scope In: Scope Out: Findings:                 One benign-appearing, intrinsic moderate stenosis                            was found 35 cm from the incisors. This stenosis                            measured 1.5 cm (inner diameter). The scope was                            withdrawn, after completing the endoscopic survey.                            Dilation was performed with a Maloney dilator with                            no resistance at 65 Fr.                           The exam of the esophagus was otherwise normal.                           The stomach revealed slipped fundoplication wrap                            with recurrent hiatal hernia. No other                            abnormalities.  The examined duodenum was normal.                           The cardia and gastric fundus were normal on                            retroflexion. Complications:            No immediate complications. Estimated Blood Loss:     Estimated blood loss: none. Impression:               - Benign-appearing esophageal stenosis. Dilated.                           - Slipped fundoplication wrap with hiatal hernia.                            Otherwise normal stomach.                           - Normal examined duodenum.                           - No specimens collected. Recommendation:           - Patient has a contact number available for                            emergencies. The signs and symptoms of potential                            delayed complications were discussed with the                            patient. Return to normal activities tomorrow.                            Written discharge instructions were provided to the                             patient.                           - Post dilation diet.                           - Continue present medications. Docia Chuck. Henrene Pastor, MD 04/03/2018 8:58:24 AM This report has been signed electronically.

## 2018-04-03 NOTE — Progress Notes (Signed)
Called to room to assist during endoscopic procedure.  Patient ID and intended procedure confirmed with present staff. Received instructions for my participation in the procedure from the performing physician.  

## 2018-04-03 NOTE — Patient Instructions (Addendum)
Thank you for allowing [place] to care for you today.  Await pathology results by mail, 1-2 weeks.  Next colonoscopy to be determined after pathology returned.  Resume previous medications.  Return to normal activities tomorrow.  Handout for polyps and post dilation diet given.  Post dilation diet today.  See handout.   YOU HAD AN ENDOSCOPIC PROCEDURE TODAY AT [facility]:   Refer to the procedure report that was given to you for any specific questions about what was found during the examination.  If the procedure report does not answer your questions, please call your gastroenterologist to clarify.  If you requested that your care partner not be given the details of your procedure findings, then the procedure report has been included in a sealed envelope for you to review at your convenience later.  YOU SHOULD EXPECT: Some feelings of bloating in the abdomen. Passage of more gas than usual.  Walking can help get rid of the air that was put into your GI tract during the procedure and reduce the bloating. If you had a lower endoscopy (such as a colonoscopy or flexible sigmoidoscopy) you may notice spotting of blood in your stool or on the toilet paper. If you underwent a bowel prep for your procedure, you may not have a normal bowel movement for a few days.  Please Note:  You might notice some irritation and congestion in your nose or some drainage.  This is from the oxygen used during your procedure.  There is no need for concern and it should clear up in a day or so.  SYMPTOMS TO REPORT IMMEDIATELY:   Following lower endoscopy (colonoscopy or flexible sigmoidoscopy):  Excessive amounts of blood in the stool  Significant tenderness or worsening of abdominal pains  Swelling of the abdomen that is new, acute  Fever of 100F or higher   Following upper endoscopy (EGD)  Vomiting of blood or coffee ground material  New chest pain or pain under the shoulder blades  Painful  or persistently difficult swallowing  New shortness of breath  Fever of 100F or higher  Black, tarry-looking stools  For urgent or emergent issues, a gastroenterologist can be reached at any hour by calling [phone].   DIET:  We do recommend a small meal at first, but then you may proceed to your regular diet.  Drink plenty of fluids but you should avoid alcoholic beverages for 24 hours.  ACTIVITY:  You should plan to take it easy for the rest of today and you should NOT DRIVE or use heavy machinery until tomorrow (because of the sedation medicines used during the test).    FOLLOW UP: Our staff will call the number listed on your records the next business day following your procedure to check on you and address any questions or concerns that you may have regarding the information given to you following your procedure. If we do not reach you, we will leave a message.  However, if you are feeling well and you are not experiencing any problems, there is no need to return our call.  We will assume that you have returned to your regular daily activities without incident.  If any biopsies were taken you will be contacted by phone or by letter within the next 1-3 weeks.  Please call us at [phone] if you have not heard about the biopsies in 3 weeks.    SIGNATURES/CONFIDENTIALITY: You and/or your care partner have signed paperwork which will be  be entered into your electronic medical record.  These signatures attest to the fact that that the information above on your After Visit Summary has been reviewed and is understood.  Full responsibility of the confidentiality of this discharge information lies with you and/or your care-partner. 

## 2018-04-03 NOTE — Progress Notes (Signed)
Pt's states no medical or surgical changes since previsit or office visit. 

## 2018-04-04 ENCOUNTER — Telehealth: Payer: Self-pay

## 2018-04-04 NOTE — Telephone Encounter (Signed)
  Follow up Call-  Call back number 04/03/2018  Post procedure Call Back phone  # [phone]  Permission to leave phone message Yes  Some recent data might be hidden     Patient questions:  Do you have a fever, pain , or abdominal swelling? No. Pain Score  0 *  Have you tolerated food without any problems? Yes.    Have you been able to return to your normal activities? Yes.    Do you have any questions about your discharge instructions: Diet   No. Medications  No. Follow up visit  No.  Do you have questions or concerns about your Care? No.  Actions: * If pain score is 4 or above: No action needed, pain <4.  No problems noted per pt. maw

## 2018-04-05 ENCOUNTER — Encounter: Payer: Self-pay | Admitting: Internal Medicine

## 2018-04-11 DIAGNOSIS — F4310 Post-traumatic stress disorder, unspecified: Secondary | ICD-10-CM | POA: Diagnosis not present

## 2018-04-12 ENCOUNTER — Ambulatory Visit: Payer: Self-pay | Admitting: Pulmonary Disease

## 2018-04-24 NOTE — Progress Notes (Signed)
Subjective:   Vanessa Oliver is a 67 y.o. female who presents for Medicare Annual (Subsequent) preventive examination.  Pt is currently seeing counselor. Pt states she still struggles with childhood issues and loving her mother whom she is now responsible for. Started Sept 2019. Pt is also starting with person trainer at gym.  Review of Systems: No ROS.  Medicare Wellness Visit. Additional risk factors are reflected in the social history. Cardiac Risk Factors include: advanced age (>64men, >62 women);dyslipidemia Home Safety/Smoke Alarms: Feels safe in home. Smoke alarms in place. Lives with husband and her mother.   Female:       Mammo-scheduled       Dexa scan- utd [facility]- next due 03/2023 per Dr.[facility]-Dr.Jones yearly.    Objective:     Vitals: BP 138/80 (BP Location: Left Arm, Patient Position: Sitting, Cuff Size: Normal)   Pulse 78   Ht 5\' 4"  (1.626 m)   Wt 162 lb 12.8 oz (73.8 kg)   LMP 05/14/1996   SpO2 98%   BMI 27.94 kg/m   Body mass index is 27.94 kg/m.  Advanced Directives 04/25/2018 04/05/2017 02/19/2017 09/06/2016 09/06/2016 09/01/2016 03/19/2016  Does Patient Have a Medical Advance Directive? Yes Yes Yes Yes Yes Yes Yes  Type of Paramedic of [place];Living will Living will Living will Living will Living will Living will Living will  Does patient want to make changes to medical advance directive? - - - No - Patient declined No - Patient declined - No - Patient declined  Copy of [place] in Chart? Yes - - - - - Yes  Would patient like information on creating a medical advance directive? - - - - - - No - patient declined information    Tobacco Social History   Tobacco Use  Smoking Status Never Smoker  Smokeless Tobacco Never Used     Counseling given: Not Answered   Clinical Intake: Pain : No/denies pain    Past Medical History:  Diagnosis Date  . Allergy    takes Singulair at night  . Anxiety   .  Arthritis   . Cataract   . Chronic back pain    stenosis  . Depression    takes CYmbalta daily  . Esophageal stricture   . Family history of adverse reaction to anesthesia    oldest brother had trouble with anesthesia a long time ago but can't recall what  . Fibromyalgia   . Fibromyalgia   . GERD (gastroesophageal reflux disease)    takes Omeprazole daily  . Hemorrhoids   . History of bronchitis   . Hyperlipidemia   . IBS (irritable bowel syndrome)   . Joint pain   . Joint swelling   . Plantar fasciitis   . Rosacea   . Rosacea   . Sleep apnea    cpap- settings at 10 breathing   . TMJ (dislocation of temporomandibular joint)   . TMJ (temporomandibular joint disorder)   . Vasovagal episode back in the 80's  . Vasovagal syncope   . Weakness    right leg   Past Surgical History:  Procedure Laterality Date  . ABDOMINAL HYSTERECTOMY  1997  . CARPAL TUNNEL RELEASE Bilateral 2004  . CARPOMETACARPAL (CMC) FUSION OF THUMB  2015   thumb  . CATARACT EXTRACTION Right 2013   Dr Ellison Hughs  . CATARACT EXTRACTION Left 04/2016  . COLONOSCOPY    . COLONOSCOPY WITH ESOPHAGOGASTRODUODENOSCOPY (EGD) AND ESOPHAGEAL DILATION (ED)    .  EYE SURGERY     scar tissue removed after cataract surgery  . 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 KNEE ARTHROSCOPY Right 1994   x 3  . KNEE SURGERY Right 01/21/2006  . LUMBAR LAMINECTOMY/DECOMPRESSION MICRODISCECTOMY Right 02/11/2015   Procedure: Laminectomy and Foraminotomy - Right - L3-L4;  Surgeon: Earnie Larsson, MD;  Location: [facility];  Service: Neurosurgery;  Laterality: Right;  Laminectomy and Foraminotomy - Right - L3-L4  . mallet finger Right 2011  . NASAL SEPTUM SURGERY  1970  . NISSEN FUNDOPLICATION  1443  . RETINAL LASER PROCEDURE Right 02/01/2011   right eye   . TONSILLECTOMY  1981  . TOTAL KNEE ARTHROPLASTY Right 09/06/2016   Procedure: RIGHT TOTAL KNEE ARTHROPLASTY;  Surgeon: Gaynelle Arabian, MD;  Location: [facility];  Service:  Orthopedics;  Laterality: Right;  . TRIGGER FINGER RELEASE Bilateral 2005  . UPPER GASTROINTESTINAL ENDOSCOPY    . VARICOSE VEIN SURGERY Bilateral 2007, 2009   left 2007, right 2009  . VITRECTOMY Right 2012   Family History  Problem Relation Age of Onset  . Breast cancer Mother   . Arthritis Mother   . Hyperlipidemia Mother   . Hypertension Mother   . Allergic rhinitis Mother   . Urticaria Mother   . Lung cancer Father   . Pancreatic cancer Father   . Skin cancer Father   . Arthritis Father   . Allergic rhinitis Father   . Emphysema Father   . Lymphoma Brother   . Celiac disease Brother   . Hypertension Brother   . Thyroid disease Brother   . Allergic rhinitis Brother   . Hyperlipidemia Maternal Grandfather   . Heart disease Maternal Grandfather   . Stroke Maternal Grandfather   . Eczema Maternal Aunt   . Asthma Neg Hx   . Immunodeficiency Neg Hx   . Angioedema Neg Hx   . Colon cancer Neg Hx   . Colon polyps Neg Hx   . Esophageal cancer Neg Hx   . Rectal cancer Neg Hx   . Stomach cancer Neg Hx    Social History   Socioeconomic History  . Marital status: Married    Spouse name: Not on file  . Number of children: 0  . Years of education: Not on file  . Highest education level: Not on file  Occupational History  . Occupation: Hydrologist: [employer]  . Financial resource strain: Not on file  . Food insecurity:    Worry: Not on file    Inability: Not on file  . Transportation needs:    Medical: Not on file    Non-medical: Not on file  Tobacco Use  . Smoking status: Never Smoker  . Smokeless tobacco: Never Used  Substance and Sexual Activity  . Alcohol use: Yes    Alcohol/week: 0.0 standard drinks    Comment: rarely  . Drug use: No  . Sexual activity: Never    Birth control/protection: Surgical  Lifestyle  . Physical activity:    Days per week: Not on file    Minutes per session: Not on file  . Stress: Not on file    Relationships  . Social connections:    Talks on phone: Not on file    Gets together: Not on file    Attends religious service: Not on file    Active member of club or organization: Not on file    Attends meetings of clubs  or organizations: Not on file    Relationship status: Not on file  Other Topics Concern  . Not on file  Social History Narrative   Works as an Glass blower/designer   Married- husband is 8 years older than her   Former Nature conservation officer   Grew up up [employer]   Has cats   Enjoys reading   No children    Outpatient Encounter Medications as of 04/25/2018  Medication Sig  . Azelaic Acid (FINACEA) 15 % cream Apply small amount to clean dry skin twice daily.  Marland Kitchen CALCIUM CITRATE PO Take 1 tablet by mouth daily.  . cholecalciferol (VITAMIN D) 1000 units tablet Take 1 tablet (1,000 Units total) by mouth 2 (two) times daily.  . clotrimazole-betamethasone (LOTRISONE) cream Apply 1 application topically 2 (two) times daily.  . Coenzyme Q10 200 MG capsule Take 200 mg by mouth daily.  . diclofenac sodium (VOLTAREN) 1 % GEL APPLY 2 GRAMS TOPICALLY TWICE A DAY AS NEEDED  . DULoxetine (CYMBALTA) 60 MG capsule TAKE 1 CAPSULE DAILY  . EPINEPHrine (EPIPEN 2-PAK) 0.3 mg/0.3 mL IJ SOAJ injection Inject 0.3 mLs (0.3 mg total) into the muscle once.  . fexofenadine (ALLEGRA) 180 MG tablet Take 1 tablet (180 mg total) by mouth daily as needed for allergies or rhinitis.  Marland Kitchen FIBER PO Take 2 capsules by mouth 2 (two) times daily.  . fluticasone (FLONASE) 50 MCG/ACT nasal spray Place 1 spray into both nostrils daily.  . Multiple Vitamin (MULTIVITAMIN) tablet Take 1 tablet by mouth daily.  Marland Kitchen omeprazole (PRILOSEC) 40 MG capsule omeprazole 40 mg capsule,delayed release  . pravastatin (PRAVACHOL) 40 MG tablet TAKE 1 TABLET DAILY  . Probiotic Product (PROBIOTIC DAILY PO) Take 1 capsule by mouth daily.  . ranitidine (ZANTAC) 150 MG capsule Take 1 capsule (150 mg total) by mouth 2 (two) times daily as needed for  heartburn.  . vitamin C (ASCORBIC ACID) 500 MG tablet Take 500 mg by mouth daily.  Marland Kitchen aspirin 81 MG tablet Take 81 mg by mouth daily.  Marland Kitchen azelastine (ASTELIN) 0.1 % nasal spray Place 1-2 sprays into both nostrils 2 (two) times daily as needed. (Patient not taking: Reported on 04/25/2018)  . benzoyl peroxide 5 % gel Apply 1 application topically 2 (two) times daily.   Marland Kitchen loratadine (CLARITIN) 10 MG tablet Take 10 mg by mouth daily as needed for allergies.   No facility-administered encounter medications on file as of 04/25/2018.     Activities of Daily Living In your present state of health, do you have any difficulty performing the following activities: 04/25/2018  Hearing? N  Vision? N  Difficulty concentrating or making decisions? N  Walking or climbing stairs? N  Dressing or bathing? N  Doing errands, shopping? N  Preparing Food and eating ? N  Using the Toilet? N  In the past six months, have you accidently leaked urine? N  Do you have problems with loss of bowel control? N  Managing your Medications? N  Managing your Finances? N  Housekeeping or managing your Housekeeping? N  Some recent data might be hidden    Patient Care Team: Debbrah Alar, NP as PCP - General (Internal Medicine) Gaynelle Arabian, MD as Consulting Physician (Orthopedic Surgery) Earnie Larsson, MD as Consulting Physician (Neurosurgery) Laurence Aly, [place] as Consulting Physician (Optometry) Wylene Simmer, MD as Consulting Physician (Orthopedic Surgery) Susa Day, MD as Consulting Physician (Orthopedic Surgery) Rigoberto Noel, MD as Consulting Physician (Pulmonary Disease)    Assessment:   This  is a routine wellness examination for Vanessa Oliver. Physical assessment deferred to PCP.  Exercise Activities and Dietary recommendations Current Exercise Habits: Home exercise routine, Type of exercise: walking, Time (Minutes): 30, Frequency (Times/Week): 7, Weekly Exercise (Minutes/Week): 210, Intensity: Mild,  Exercise limited by: None identified   Diet (meal preparation, eat out, water intake, caffeinated beverages, dairy products, fruits and vegetables): in general, a "healthy" diet  , well balanced    Goals    . Healthy lifestyle    . Increase physical activity       Fall Risk Fall Risk  04/25/2018 04/05/2017 08/09/2016 05/18/2016 05/18/2016  Falls in the past year? Yes No No No No  Number falls in past yr: 1 - - - -  Injury with Fall? Yes - - - -  Follow up Education provided;Falls prevention discussed - - - -   Depression Screen PHQ 2/9 Scores 04/25/2018 12/28/2017 04/05/2017 03/18/2017  PHQ - 2 Score 1 3 0 0  PHQ- 9 Score - 12 - 0     Cognitive Function Ad8 score reviewed for issues:  Issues making decisions:no  Less interest in hobbies / activities:no  Repeats questions, stories (family complaining):no  Trouble using ordinary gadgets (microwave, computer, phone):no  Forgets the month or year: no  Mismanaging finances: no  Remembering appts:no  Daily problems with thinking and/or memory:no Ad8 score is=0   MMSE - Mini Mental State Exam 03/19/2016  Orientation to time 5  Orientation to Place 5  Registration 3  Attention/ Calculation 5  Recall 3  Language- name 2 objects 2  Language- repeat 1  Language- follow 3 step command 3  Language- read & follow direction 1  Write a sentence 1  Copy design 1  Total score 30        Immunization History  Administered Date(s) Administered  . Influenza, High Dose Seasonal PF 03/19/2016, 03/18/2017, 03/14/2018  . Influenza,inj,Quad PF,6+ Mos 03/14/2015  . Influenza-Unspecified 03/23/2014  . Pneumococcal Conjugate-13 09/23/2014  . Td 02/16/2011  . Zoster 08/08/2009   Screening Tests Health Maintenance  Topic Date Due  . MAMMOGRAM  04/26/2019  . TETANUS/TDAP  02/15/2021  . COLONOSCOPY  04/04/2023  . INFLUENZA VACCINE  Completed  . DEXA SCAN  Completed  . Hepatitis C Screening  Completed     Plan:   Keep up the  great work!  Please schedule your next medicare wellness visit with me in 1 yr.  Continue to eat heart healthy diet (full of fruits, vegetables, whole grains, lean protein, water--limit salt, fat, and sugar intake) and increase physical activity as tolerated.    I have personally reviewed and noted the following in the patient's chart:   . Medical and social history . Use of alcohol, tobacco or illicit drugs  . Current medications and supplements . Functional ability and status . Nutritional status . Physical activity . Advanced directives . List of other physicians . Hospitalizations, surgeries, and ER visits in previous 12 months . Vitals . Screenings to include cognitive, depression, and falls . Referrals and appointments  In addition, I have reviewed and discussed with patient certain preventive protocols, quality metrics, and best practice recommendations. A written personalized care plan for preventive services as well as general preventive health recommendations were provided to patient.     Shela Nevin, [place]  04/25/2018

## 2018-04-25 ENCOUNTER — Encounter: Payer: Self-pay | Admitting: *Deleted

## 2018-04-25 ENCOUNTER — Ambulatory Visit (INDEPENDENT_AMBULATORY_CARE_PROVIDER_SITE_OTHER): Payer: Medicare Other | Admitting: *Deleted

## 2018-04-25 VITALS — BP 138/80 | HR 78 | Ht 64.0 in | Wt 162.8 lb

## 2018-04-25 DIAGNOSIS — Z0000 Encounter for general adult medical examination without abnormal findings: Secondary | ICD-10-CM | POA: Diagnosis not present

## 2018-04-25 NOTE — Progress Notes (Signed)
Reviewed and agree.  Clif Serio S O'Sullivan NP 

## 2018-04-25 NOTE — Patient Instructions (Signed)
Keep up the great work!  Please schedule your next medicare wellness visit with me in 1 yr.  Continue to eat heart healthy diet (full of fruits, vegetables, whole grains, lean protein, water--limit salt, fat, and sugar intake) and increase physical activity as tolerated.   Vanessa Oliver , Thank you for taking time to come for your Medicare Wellness Visit. I appreciate your ongoing commitment to your health goals. Please review the following plan we discussed and let me know if I can assist you in the future.   These are the goals we discussed: Goals    . Healthy lifestyle    . Increase physical activity       This is a list of the screening recommended for you and due dates:  Health Maintenance  Topic Date Due  . Mammogram  04/26/2019  . Tetanus Vaccine  02/15/2021  . Colon Cancer Screening  04/04/2023  . Flu Shot  Completed  . DEXA scan (bone density measurement)  Completed  .  Hepatitis C: One time screening is recommended by Center for Disease Control  (CDC) for  adults born from [birth year] through 1967.   Completed    Health Maintenance for Postmenopausal Women Menopause is a normal process in which your reproductive ability comes to an end. This process happens gradually over a span of months to years, usually between the ages of 43 and 34. Menopause is complete when you have missed 12 consecutive menstrual periods. It is important to talk with your health care provider about some of the most common conditions that affect postmenopausal women, such as heart disease, cancer, and bone loss (osteoporosis). Adopting a healthy lifestyle and getting preventive care can help to promote your health and wellness. Those actions can also lower your chances of developing some of these common conditions. What should I know about menopause? During menopause, you may experience a number of symptoms, such as:  Moderate-to-severe hot flashes.  Night sweats.  Decrease in sex drive.  Mood  swings.  Headaches.  Tiredness.  Irritability.  Memory problems.  Insomnia.  Choosing to treat or not to treat menopausal changes is an individual decision that you make with your health care provider. What should I know about hormone replacement therapy and supplements? Hormone therapy products are effective for treating symptoms that are associated with menopause, such as hot flashes and night sweats. Hormone replacement carries certain risks, especially as you become older. If you are thinking about using estrogen or estrogen with progestin treatments, discuss the benefits and risks with your health care provider. What should I know about heart disease and stroke? Heart disease, heart attack, and stroke become more likely as you age. This may be due, in part, to the hormonal changes that your body experiences during menopause. These can affect how your body processes dietary fats, triglycerides, and cholesterol. Heart attack and stroke are both medical emergencies. There are many things that you can do to help prevent heart disease and stroke:  Have your blood pressure checked at least every 1-2 years. High blood pressure causes heart disease and increases the risk of stroke.  If you are 23-75 years old, ask your health care provider if you should take aspirin to prevent a heart attack or a stroke.  Do not use any tobacco products, including cigarettes, chewing tobacco, or electronic cigarettes. If you need help quitting, ask your health care provider.  It is important to eat a healthy diet and maintain a healthy weight. ? Be sure  to include plenty of vegetables, fruits, low-fat dairy products, and lean protein. ? Avoid eating foods that are high in solid fats, added sugars, or salt (sodium).  Get regular exercise. This is one of the most important things that you can do for your health. ? Try to exercise for at least 150 minutes each week. The type of exercise that you do should  increase your heart rate and make you sweat. This is known as moderate-intensity exercise. ? Try to do strengthening exercises at least twice each week. Do these in addition to the moderate-intensity exercise.  Know your numbers.Ask your health care provider to check your cholesterol and your blood glucose. Continue to have your blood tested as directed by your health care provider.  What should I know about cancer screening? There are several types of cancer. Take the following steps to reduce your risk and to catch any cancer development as early as possible. Breast Cancer  Practice breast self-awareness. ? This means understanding how your breasts normally appear and feel. ? It also means doing regular breast self-exams. Let your health care provider know about any changes, no matter how small.  If you are 71 or older, have a clinician do a breast exam (clinical breast exam or CBE) every year. Depending on your age, family history, and medical history, it may be recommended that you also have a yearly breast X-ray (mammogram).  If you have a family history of breast cancer, talk with your health care provider about genetic screening.  If you are at high risk for breast cancer, talk with your health care provider about having an MRI and a mammogram every year.  Breast cancer (BRCA) gene test is recommended for women who have family members with BRCA-related cancers. Results of the assessment will determine the need for genetic counseling and BRCA1 and for BRCA2 testing. BRCA-related cancers include these types: ? Breast. This occurs in males or females. ? Ovarian. ? Tubal. This may also be called fallopian tube cancer. ? Cancer of the abdominal or pelvic lining (peritoneal cancer). ? Prostate. ? Pancreatic.  Cervical, Uterine, and Ovarian Cancer Your health care provider may recommend that you be screened regularly for cancer of the pelvic organs. These include your ovaries, uterus,  and vagina. This screening involves a pelvic exam, which includes checking for microscopic changes to the surface of your cervix (Pap test).  For women ages 21-65, health care providers may recommend a pelvic exam and a Pap test every three years. For women ages 22-65, they may recommend the Pap test and pelvic exam, combined with testing for human papilloma virus (HPV), every five years. Some types of HPV increase your risk of cervical cancer. Testing for HPV may also be done on women of any age who have unclear Pap test results.  Other health care providers may not recommend any screening for nonpregnant women who are considered low risk for pelvic cancer and have no symptoms. Ask your health care provider if a screening pelvic exam is right for you.  If you have had past treatment for cervical cancer or a condition that could lead to cancer, you need Pap tests and screening for cancer for at least 20 years after your treatment. If Pap tests have been discontinued for you, your risk factors (such as having a new sexual partner) need to be reassessed to determine if you should start having screenings again. Some women have medical problems that increase the chance of getting cervical cancer. In these  cases, your health care provider may recommend that you have screening and Pap tests more often.  If you have a family history of uterine cancer or ovarian cancer, talk with your health care provider about genetic screening.  If you have vaginal bleeding after reaching menopause, tell your health care provider.  There are currently no reliable tests available to screen for ovarian cancer.  Lung Cancer Lung cancer screening is recommended for adults 37-89 years old who are at high risk for lung cancer because of a history of smoking. A yearly low-dose CT scan of the lungs is recommended if you:  Currently smoke.  Have a history of at least 30 pack-years of smoking and you currently smoke or have quit  within the past 15 years. A pack-year is smoking an average of one pack of cigarettes per day for one year.  Yearly screening should:  Continue until it has been 15 years since you quit.  Stop if you develop a health problem that would prevent you from having lung cancer treatment.  Colorectal Cancer  This type of cancer can be detected and can often be prevented.  Routine colorectal cancer screening usually begins at age 51 and continues through age 53.  If you have risk factors for colon cancer, your health care provider may recommend that you be screened at an earlier age.  If you have a family history of colorectal cancer, talk with your health care provider about genetic screening.  Your health care provider may also recommend using home test kits to check for hidden blood in your stool.  A small camera at the end of a tube can be used to examine your colon directly (sigmoidoscopy or colonoscopy). This is done to check for the earliest forms of colorectal cancer.  Direct examination of the colon should be repeated every 5-10 years until age 31. However, if early forms of precancerous polyps or small growths are found or if you have a family history or genetic risk for colorectal cancer, you may need to be screened more often.  Skin Cancer  Check your skin from head to toe regularly.  Monitor any moles. Be sure to tell your health care provider: ? About any new moles or changes in moles, especially if there is a change in a mole's shape or color. ? If you have a mole that is larger than the size of a pencil eraser.  If any of your family members has a history of skin cancer, especially at a young age, talk with your health care provider about genetic screening.  Always use sunscreen. Apply sunscreen liberally and repeatedly throughout the day.  Whenever you are outside, protect yourself by wearing long sleeves, pants, a wide-brimmed hat, and sunglasses.  What should I know  about osteoporosis? Osteoporosis is a condition in which bone destruction happens more quickly than new bone creation. After menopause, you may be at an increased risk for osteoporosis. To help prevent osteoporosis or the bone fractures that can happen because of osteoporosis, the following is recommended:  If you are 28-52 years old, get at least 1,000 mg of calcium and at least 600 mg of vitamin D per day.  If you are older than age 26 but younger than age 25, get at least 1,200 mg of calcium and at least 600 mg of vitamin D per day.  If you are older than age 68, get at least 1,200 mg of calcium and at least 800 mg of vitamin D per  day.  Smoking and excessive alcohol intake increase the risk of osteoporosis. Eat foods that are rich in calcium and vitamin D, and do weight-bearing exercises several times each week as directed by your health care provider. What should I know about how menopause affects my mental health? Depression may occur at any age, but it is more common as you become older. Common symptoms of depression include:  Low or sad mood.  Changes in sleep patterns.  Changes in appetite or eating patterns.  Feeling an overall lack of motivation or enjoyment of activities that you previously enjoyed.  Frequent crying spells.  Talk with your health care provider if you think that you are experiencing depression. What should I know about immunizations? It is important that you get and maintain your immunizations. These include:  Tetanus, diphtheria, and pertussis (Tdap) booster vaccine.  Influenza every year before the flu season begins.  Pneumonia vaccine.  Shingles vaccine.  Your health care provider may also recommend other immunizations. This information is not intended to replace advice given to you by your health care provider. Make sure you discuss any questions you have with your health care provider. Document Released: 08/20/2005 Document Revised: 01/16/2016  Document Reviewed: 04/01/2015 Elsevier Interactive Patient Education  2018 [employer].

## 2018-04-26 DIAGNOSIS — F4310 Post-traumatic stress disorder, unspecified: Secondary | ICD-10-CM | POA: Diagnosis not present

## 2018-05-02 ENCOUNTER — Inpatient Hospital Stay (HOSPITAL_BASED_OUTPATIENT_CLINIC_OR_DEPARTMENT_OTHER): Admission: RE | Admit: 2018-05-02 | Payer: Self-pay | Source: Ambulatory Visit

## 2018-05-02 DIAGNOSIS — F4310 Post-traumatic stress disorder, unspecified: Secondary | ICD-10-CM | POA: Diagnosis not present

## 2018-05-04 ENCOUNTER — Encounter: Payer: Self-pay | Admitting: Pulmonary Disease

## 2018-05-04 ENCOUNTER — Ambulatory Visit (INDEPENDENT_AMBULATORY_CARE_PROVIDER_SITE_OTHER): Payer: Medicare Other | Admitting: Pulmonary Disease

## 2018-05-04 DIAGNOSIS — G4733 Obstructive sleep apnea (adult) (pediatric): Secondary | ICD-10-CM

## 2018-05-04 NOTE — Progress Notes (Signed)
   Subjective:    Patient ID: Vanessa Oliver, female    DOB: 1950/11/07, 67 y.o.   MRN: [id number]  HPI  67 yo for FU of OSA She had a deviated septum that was repaired at age 61. She wears a mouth piece for pain in her temporomandibular joint.  Doing well on CPAP machine, resting well, denies daytime somnolence and fatigue.  She traveled to [place] and take the machine with her. She has settled down with a nasal mask. No problems with pressure or mask. CPAP download was reviewed which shows excellent control of events on 10 cm with minimal leak and good compliance more than 8 hours per night.  She had a local arm reaction to the high-dose flu shot.  She is also allergic to many other medications  Significant tests/ events reviewed  PSG 07/2014-weight 163 pounds- RDI 23/hour, with 24 central apneas, total sleep time 277 minutes  Review of Systems Patient denies significant dyspnea,cough, hemoptysis,  chest pain, palpitations, pedal edema, orthopnea, paroxysmal nocturnal dyspnea, lightheadedness, nausea, vomiting, abdominal or  leg pains      Objective:   Physical Exam  Gen. Pleasant, well-nourished, in no distress ENT - no thrush, no post nasal drip Neck: No JVD, no thyromegaly, no carotid bruits Lungs: no use of accessory muscles, no dullness to percussion, clear without rales or rhonchi  Cardiovascular: Rhythm regular, heart sounds  normal, no murmurs or gallops, no peripheral edema Musculoskeletal: No deformities, no cyanosis or clubbing         Assessment & Plan:

## 2018-05-04 NOTE — Patient Instructions (Signed)
CPAP is set at 10 cm & is working well

## 2018-05-04 NOTE — Assessment & Plan Note (Signed)
CPAP is working well on 10 cm and this is certainly helped improve her daytime somnolence and fatigue. She is compliant  compliance with goal of at least 4-6 hrs every night is the expectation. Advised against medications with sedative side effects Cautioned against driving when sleepy - understanding that sleepiness will vary on a day to day basis

## 2018-05-15 ENCOUNTER — Encounter: Payer: Self-pay | Admitting: Family

## 2018-05-15 ENCOUNTER — Ambulatory Visit (INDEPENDENT_AMBULATORY_CARE_PROVIDER_SITE_OTHER): Payer: Medicare Other | Admitting: Family

## 2018-05-15 ENCOUNTER — Ambulatory Visit (HOSPITAL_BASED_OUTPATIENT_CLINIC_OR_DEPARTMENT_OTHER)
Admission: RE | Admit: 2018-05-15 | Discharge: 2018-05-15 | Disposition: A | Discharge status: Home / Self Care | Payer: Medicare Other | Source: Ambulatory Visit | Attending: Family | Admitting: Family

## 2018-05-15 VITALS — BP 129/70 | HR 78 | Temp 97.8°F | Resp 18 | Ht 64.0 in | Wt 164.8 lb

## 2018-05-15 DIAGNOSIS — M25551 Pain in right hip: Secondary | ICD-10-CM | POA: Diagnosis not present

## 2018-05-15 DIAGNOSIS — M25522 Pain in left elbow: Secondary | ICD-10-CM

## 2018-05-15 DIAGNOSIS — F4310 Post-traumatic stress disorder, unspecified: Secondary | ICD-10-CM | POA: Diagnosis not present

## 2018-05-15 MED ORDER — FLUTICASONE PROPIONATE 50 MCG/ACT NA SUSP: 1.0000 | Freq: Every day | NASAL | 1 refills | Status: DC

## 2018-05-15 MED ORDER — DICLOFENAC SODIUM 1 % TD GEL: TRANSDERMAL | 0 refills | Status: DC

## 2018-05-15 MED ORDER — PRAVASTATIN SODIUM 40 MG PO TABS: 40.0000 mg | ORAL_TABLET | Freq: Every day | ORAL | 1 refills | Status: DC

## 2018-05-15 MED ORDER — OMEPRAZOLE 40 MG PO CPDR: DELAYED_RELEASE_CAPSULE | ORAL | 1 refills | Status: DC

## 2018-05-15 NOTE — Patient Instructions (Signed)
Please complete x-rays on the first floor.  

## 2018-05-15 NOTE — Progress Notes (Signed)
Subjective:    Patient ID: Vanessa Oliver, female    DOB: January 12, 1951, 67 y.o.   MRN: [id number]  HPI  Patient is a 67 yr old female who presents today following a fall. Reports that she fell while on vacation in [place].  Has had right sided hip pain since that time.  Also has swelling on left elbow.  Reports that she fell on the left elbow.  Has some burning in the right hip.  Reports that her right hip "felt like it was on fire" after it occurred. Able to weight bear without difficulty.  Left elbow is still sore to touch. Can't lean down on the left elbow.   Fall occurred on 04/18/18.     Review of Systems See HPI  Past Medical History:  Diagnosis Date  . Allergy    takes Singulair at night  . Anxiety   . Arthritis   . Cataract   . Chronic back pain    stenosis  . Depression    takes CYmbalta daily  . Esophageal stricture   . Family history of adverse reaction to anesthesia    oldest brother had trouble with anesthesia a long time ago but can't recall what  . Fibromyalgia   . Fibromyalgia   . GERD (gastroesophageal reflux disease)    takes Omeprazole daily  . Hemorrhoids   . History of bronchitis   . Hyperlipidemia   . IBS (irritable bowel syndrome)   . Joint pain   . Joint swelling   . Plantar fasciitis   . Rosacea   . Rosacea   . Sleep apnea    cpap- settings at 10 breathing   . TMJ (dislocation of temporomandibular joint)   . TMJ (temporomandibular joint disorder)   . Vasovagal episode back in the 80's  . Vasovagal syncope   . Weakness    right leg     Social History   Socioeconomic History  . Marital status: Married    Spouse name: Not on file  . Number of children: 0  . Years of education: Not on file  . Highest education level: Not on file  Occupational History  . Occupation: Hydrologist: [employer]  . Financial resource strain: Not on file  . Food insecurity:    Worry: Not on file    Inability: Not on file  .  Transportation needs:    Medical: Not on file    Non-medical: Not on file  Tobacco Use  . Smoking status: Never Smoker  . Smokeless tobacco: Never Used  Substance and Sexual Activity  . Alcohol use: Yes    Alcohol/week: 0.0 standard drinks    Comment: rarely  . Drug use: No  . Sexual activity: Never    Birth control/protection: Surgical  Lifestyle  . Physical activity:    Days per week: Not on file    Minutes per session: Not on file  . Stress: Not on file  Relationships  . Social connections:    Talks on phone: Not on file    Gets together: Not on file    Attends religious service: Not on file    Active member of club or organization: Not on file    Attends meetings of clubs or organizations: Not on file    Relationship status: Not on file  . Intimate partner violence:    Fear of current or ex partner: Not on file    Emotionally abused: Not on  file    Physically abused: Not on file    Forced sexual activity: Not on file  Other Topics Concern  . Not on file  Social History Narrative   Works as an Glass blower/designer   Married- husband is 28 years older than her   Former Dance movement psychotherapist up up [employer]   Has cats   Enjoys reading   No children    Past Surgical History:  Procedure Laterality Date  . ABDOMINAL HYSTERECTOMY  1997  . CARPAL TUNNEL RELEASE Bilateral 2004  . CARPOMETACARPAL (CMC) FUSION OF THUMB  2015   thumb  . CATARACT EXTRACTION Right 2013   Dr Ellison Hughs  . CATARACT EXTRACTION Left 04/2016  . COLONOSCOPY    . COLONOSCOPY WITH ESOPHAGOGASTRODUODENOSCOPY (EGD) AND ESOPHAGEAL DILATION (ED)    . EYE SURGERY     scar tissue removed after cataract surgery  . 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 KNEE ARTHROSCOPY Right 1994   x 3  . KNEE SURGERY Right 01/21/2006  . LUMBAR LAMINECTOMY/DECOMPRESSION MICRODISCECTOMY Right 02/11/2015   Procedure: Laminectomy and Foraminotomy - Right - L3-L4;  Surgeon: Earnie Larsson, MD;  Location: [facility];  Service:  Neurosurgery;  Laterality: Right;  Laminectomy and Foraminotomy - Right - L3-L4  . mallet finger Right 2011  . NASAL SEPTUM SURGERY  1970  . NISSEN FUNDOPLICATION  7782  . RETINAL LASER PROCEDURE Right 02/01/2011   right eye   . TONSILLECTOMY  1981  . TOTAL KNEE ARTHROPLASTY Right 09/06/2016   Procedure: RIGHT TOTAL KNEE ARTHROPLASTY;  Surgeon: Gaynelle Arabian, MD;  Location: [facility];  Service: Orthopedics;  Laterality: Right;  . TRIGGER FINGER RELEASE Bilateral 2005  . UPPER GASTROINTESTINAL ENDOSCOPY    . VARICOSE VEIN SURGERY Bilateral 2007, 2009   left 2007, right 2009  . VITRECTOMY Right 2012    Family History  Problem Relation Age of Onset  . Breast cancer Mother   . Arthritis Mother   . Hyperlipidemia Mother   . Hypertension Mother   . Allergic rhinitis Mother   . Urticaria Mother   . Lung cancer Father   . Pancreatic cancer Father   . Skin cancer Father   . Arthritis Father   . Allergic rhinitis Father   . Emphysema Father   . Lymphoma Brother   . Celiac disease Brother   . Hypertension Brother   . Thyroid disease Brother   . Allergic rhinitis Brother   . Hyperlipidemia Maternal Grandfather   . Heart disease Maternal Grandfather   . Stroke Maternal Grandfather   . Eczema Maternal Aunt   . Asthma Neg Hx   . Immunodeficiency Neg Hx   . Angioedema Neg Hx   . Colon cancer Neg Hx   . Colon polyps Neg Hx   . Esophageal cancer Neg Hx   . Rectal cancer Neg Hx   . Stomach cancer Neg Hx     Allergies  Allergen Reactions  . Aleve [Naproxen Sodium]     Swelling, itching  . Cefuroxime Axetil     Hives / "throat swelling"  . Chlorzoxazone Rash and Other (See Comments)    "breathing problems"   . Oxycodone     Hallucinations, rash  . Penicillins Anaphylaxis and Hives    Has patient had a PCN reaction causing immediate rash, facial/tongue/throat swelling, SOB or lightheadedness with hypotension: Yes Has patient had a PCN reaction causing severe rash involving mucus  membranes or skin necrosis: No Has patient  had a PCN reaction that required hospitalization No Has patient had a PCN reaction occurring within the last 10 years: No If all of the above answers are "NO", then may proceed with Cephalosporin use.   . Adhesive [Tape]     rash  . Cataflam [Diclofenac Potassium]     Lip swelling  . Clarithromycin Diarrhea  . Flagyl [Metronidazole Hcl]     ?diarrhea  . Fluzone [Flu Virus Vaccine]     Had local redness with high dose.   . Glucosamine Hives  . Guaifenesin & Derivatives     Stomach upset  . Hydrocodone-Acetaminophen     REACTION: rash, tightening of throat  . Ketorolac Tromethamine     ?reaction  . Pentazocine     Involuntary twitching  . Pneumovax [Pneumococcal Polysaccharide Vaccine] Other (See Comments)    Redness/swelling at site, nausea  . Prevnar [Pneumococcal 13-Val Conj Vacc] Other (See Comments)    Swelling, redness, nausea  . Smz-Tmp Ds [Sulfamethoxazole W/Trimethoprim (Co-Trimoxazole)]     ?unknown reaction  . Tramadol     constipation  . Trazodone And Nefazodone Other (See Comments)    Scratchy throat / congestion  . Latex Rash    Current Outpatient Medications on File Prior to Visit  Medication Sig Dispense Refill  . Azelaic Acid (FINACEA) 15 % cream Apply small amount to clean dry skin twice daily. 90 g 3  . azelastine (ASTELIN) 0.1 % nasal spray Place 1-2 sprays into both nostrils 2 (two) times daily as needed. 90 mL 3  . CALCIUM CITRATE PO Take 1 tablet by mouth daily.    . cholecalciferol (VITAMIN D) 1000 units tablet Take 1 tablet (1,000 Units total) by mouth 2 (two) times daily.    . Coenzyme Q10 200 MG capsule Take 200 mg by mouth daily.    . DULoxetine (CYMBALTA) 60 MG capsule TAKE 1 CAPSULE DAILY 90 capsule 1  . EPINEPHrine (EPIPEN 2-PAK) 0.3 mg/0.3 mL IJ SOAJ injection Inject 0.3 mLs (0.3 mg total) into the muscle once. 1 Device 1  . fexofenadine (ALLEGRA) 180 MG tablet Take 1 tablet (180 mg total) by mouth  daily as needed for allergies or rhinitis. 90 tablet 3  . FIBER PO Take 2 capsules by mouth 2 (two) times daily.    Marland Kitchen loratadine (CLARITIN) 10 MG tablet Take 10 mg by mouth daily as needed for allergies.    . Multiple Vitamin (MULTIVITAMIN) tablet Take 1 tablet by mouth daily.    . Probiotic Product (PROBIOTIC DAILY PO) Take 1 capsule by mouth daily.    . ranitidine (ZANTAC) 150 MG capsule Take 1 capsule (150 mg total) by mouth 2 (two) times daily as needed for heartburn. 180 capsule 3  . vitamin C (ASCORBIC ACID) 500 MG tablet Take 500 mg by mouth daily.     No current facility-administered medications on file prior to visit.     BP 129/70 (BP Location: Right Arm, Cuff Size: Normal)   Pulse 78   Temp 97.8 F (36.6 C) (Oral)   Resp 18   Ht 5\' 4"  (1.626 m)   Wt 164 lb 12.8 oz (74.8 kg)   LMP 05/14/1996   SpO2 100%   BMI 28.29 kg/m       Objective:   Physical Exam  Constitutional: She is oriented to person, place, and time. She appears well-developed and well-nourished.  HENT:  Head: Normocephalic and atraumatic.  Cardiovascular: Normal rate, regular rhythm and normal heart sounds.  No murmur heard. Pulmonary/Chest:  Effort normal and breath sounds normal. No respiratory distress. She has no wheezes.  Musculoskeletal:  R hip, + mild pain with flexion/adduction/abduction.  L elbow with small tender mobile pea sized mass (? small hematoma)  Neurological: She is alert and oriented to person, place, and time.  Skin: Skin is warm and dry.  Psychiatric: She has a normal mood and affect. Her behavior is normal. Judgment and thought content normal.          Assessment & Plan:  Left elbow pain- will obtain x-ray to further evaluate.  R hip pain- obtain x-ray to further evaluate.

## 2018-05-16 ENCOUNTER — Encounter: Payer: Self-pay | Admitting: Family

## 2018-05-17 ENCOUNTER — Ambulatory Visit (HOSPITAL_BASED_OUTPATIENT_CLINIC_OR_DEPARTMENT_OTHER)
Admission: RE | Admit: 2018-05-17 | Discharge: 2018-05-17 | Disposition: A | Discharge status: Home / Self Care | Payer: Medicare Other | Source: Ambulatory Visit | Attending: Family | Admitting: Family

## 2018-05-17 DIAGNOSIS — Z1231 Encounter for screening mammogram for malignant neoplasm of breast: Secondary | ICD-10-CM | POA: Insufficient documentation

## 2018-05-17 DIAGNOSIS — Z1239 Encounter for other screening for malignant neoplasm of breast: Secondary | ICD-10-CM

## 2018-05-22 ENCOUNTER — Ambulatory Visit: Payer: Self-pay | Admitting: Family

## 2018-05-23 ENCOUNTER — Encounter: Payer: Self-pay | Admitting: Family

## 2018-05-23 ENCOUNTER — Ambulatory Visit (INDEPENDENT_AMBULATORY_CARE_PROVIDER_SITE_OTHER): Payer: Medicare Other | Admitting: Family

## 2018-05-23 VITALS — BP 122/63 | HR 76 | Temp 97.7°F | Resp 18 | Ht 64.0 in | Wt 161.0 lb

## 2018-05-23 DIAGNOSIS — E785 Hyperlipidemia, unspecified: Secondary | ICD-10-CM | POA: Diagnosis not present

## 2018-05-23 DIAGNOSIS — F329 Major depressive disorder, single episode, unspecified: Secondary | ICD-10-CM | POA: Diagnosis not present

## 2018-05-23 DIAGNOSIS — K219 Gastro-esophageal reflux disease without esophagitis: Secondary | ICD-10-CM | POA: Diagnosis not present

## 2018-05-23 DIAGNOSIS — M797 Fibromyalgia: Secondary | ICD-10-CM

## 2018-05-23 DIAGNOSIS — F32A Depression, unspecified: Secondary | ICD-10-CM

## 2018-05-23 DIAGNOSIS — G4733 Obstructive sleep apnea (adult) (pediatric): Secondary | ICD-10-CM

## 2018-05-23 LAB — LIPID PANEL
Cholesterol: 173 mg/dL (ref 0–200)
HDL: 43.7 mg/dL (ref >39.00)
LDL CALC: 110 mg/dL — AB (ref 0–99)
NonHDL: 129.59
TRIGLYCERIDES: 100 mg/dL (ref 0.0–149.0)
Total CHOL/HDL Ratio: 4
VLDL: 20 mg/dL (ref 0.0–40.0)

## 2018-05-23 NOTE — Patient Instructions (Signed)
Please complete lab work prior to leaving.   

## 2018-05-23 NOTE — Progress Notes (Signed)
Subjective:    Patient ID: Vanessa Oliver, female    DOB: 1951/02/08, 67 y.o.   MRN: [id number]  HPI   Patient is a 67 yr old female who presents today for follow up.  Depression/FM- maintained on cymbalta. Reports that her myalgia pains are improved.  She reports that she has been getting out of the house to exercise and go to her therapy.  This is been very helpful for her mood and stress levels.  Hyperlipidemia-maintained on pravastatin 40 mg daily. Lab Results  Component Value Date   CHOL 180 12/10/2016   HDL 37 (L) 12/10/2016   LDLCALC 110 (H) 12/10/2016   TRIG 165 (H) 12/10/2016   CHOLHDL 4.9 12/10/2016   GERD- maintained on omeprazole 40mg  and zantac (per allergy). Reports that symptoms are well controlled.   OSA-she continues CPAP.  This is being managed by pulmonary.  She is motivated to lose weight.  Review of Systems    see HPI  Past Medical History:  Diagnosis Date  . Allergy    takes Singulair at night  . Anxiety   . Arthritis   . Cataract   . Chronic back pain    stenosis  . Depression    takes CYmbalta daily  . Esophageal stricture   . Family history of adverse reaction to anesthesia    oldest brother had trouble with anesthesia a long time ago but can't recall what  . Fibromyalgia   . Fibromyalgia   . GERD (gastroesophageal reflux disease)    takes Omeprazole daily  . Hemorrhoids   . History of bronchitis   . Hyperlipidemia   . IBS (irritable bowel syndrome)   . Joint pain   . Joint swelling   . Plantar fasciitis   . Rosacea   . Rosacea   . Sleep apnea    cpap- settings at 10 breathing   . TMJ (dislocation of temporomandibular joint)   . TMJ (temporomandibular joint disorder)   . Vasovagal episode back in the 80's  . Vasovagal syncope   . Weakness    right leg     Social History   Socioeconomic History  . Marital status: Married    Spouse name: Not on file  . Number of children: 0  . Years of education: Not on file  . Highest  education level: Not on file  Occupational History  . Occupation: Hydrologist: [employer]  . Financial resource strain: Not on file  . Food insecurity:    Worry: Not on file    Inability: Not on file  . Transportation needs:    Medical: Not on file    Non-medical: Not on file  Tobacco Use  . Smoking status: Never Smoker  . Smokeless tobacco: Never Used  Substance and Sexual Activity  . Alcohol use: Yes    Alcohol/week: 0.0 standard drinks    Comment: rarely  . Drug use: No  . Sexual activity: Never    Birth control/protection: Surgical  Lifestyle  . Physical activity:    Days per week: Not on file    Minutes per session: Not on file  . Stress: Not on file  Relationships  . Social connections:    Talks on phone: Not on file    Gets together: Not on file    Attends religious service: Not on file    Active member of club or organization: Not on file    Attends meetings of clubs or  organizations: Not on file    Relationship status: Not on file  . Intimate partner violence:    Fear of current or ex partner: Not on file    Emotionally abused: Not on file    Physically abused: Not on file    Forced sexual activity: Not on file  Other Topics Concern  . Not on file  Social History Narrative   Works as an Glass blower/designer   Married- husband is 59 years older than her   Former Dance movement psychotherapist up up [employer]   Has cats   Enjoys reading   No children    Past Surgical History:  Procedure Laterality Date  . ABDOMINAL HYSTERECTOMY  1997  . CARPAL TUNNEL RELEASE Bilateral 2004  . CARPOMETACARPAL (CMC) FUSION OF THUMB  2015   thumb  . CATARACT EXTRACTION Right 2013   Dr Ellison Hughs  . CATARACT EXTRACTION Left 04/2016  . COLONOSCOPY    . COLONOSCOPY WITH ESOPHAGOGASTRODUODENOSCOPY (EGD) AND ESOPHAGEAL DILATION (ED)    . EYE SURGERY     scar tissue removed after cataract surgery  . 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 KNEE ARTHROSCOPY Right  1994   x 3  . KNEE SURGERY Right 01/21/2006  . LUMBAR LAMINECTOMY/DECOMPRESSION MICRODISCECTOMY Right 02/11/2015   Procedure: Laminectomy and Foraminotomy - Right - L3-L4;  Surgeon: Earnie Larsson, MD;  Location: [facility];  Service: Neurosurgery;  Laterality: Right;  Laminectomy and Foraminotomy - Right - L3-L4  . mallet finger Right 2011  . NASAL SEPTUM SURGERY  1970  . NISSEN FUNDOPLICATION  5465  . RETINAL LASER PROCEDURE Right 02/01/2011   right eye   . TONSILLECTOMY  1981  . TOTAL KNEE ARTHROPLASTY Right 09/06/2016   Procedure: RIGHT TOTAL KNEE ARTHROPLASTY;  Surgeon: Gaynelle Arabian, MD;  Location: [facility];  Service: Orthopedics;  Laterality: Right;  . TRIGGER FINGER RELEASE Bilateral 2005  . UPPER GASTROINTESTINAL ENDOSCOPY    . VARICOSE VEIN SURGERY Bilateral 2007, 2009   left 2007, right 2009  . VITRECTOMY Right 2012    Family History  Problem Relation Age of Onset  . Breast cancer Mother   . Arthritis Mother   . Hyperlipidemia Mother   . Hypertension Mother   . Allergic rhinitis Mother   . Urticaria Mother   . Lung cancer Father   . Pancreatic cancer Father   . Skin cancer Father   . Arthritis Father   . Allergic rhinitis Father   . Emphysema Father   . Lymphoma Brother   . Celiac disease Brother   . Hypertension Brother   . Thyroid disease Brother   . Allergic rhinitis Brother   . Hyperlipidemia Maternal Grandfather   . Heart disease Maternal Grandfather   . Stroke Maternal Grandfather   . Eczema Maternal Aunt   . Asthma Neg Hx   . Immunodeficiency Neg Hx   . Angioedema Neg Hx   . Colon cancer Neg Hx   . Colon polyps Neg Hx   . Esophageal cancer Neg Hx   . Rectal cancer Neg Hx   . Stomach cancer Neg Hx     Allergies  Allergen Reactions  . Aleve [Naproxen Sodium]     Swelling, itching  . Cefuroxime Axetil     Hives / "throat swelling"  . Chlorzoxazone Rash and Other (See Comments)    "breathing problems"   . Oxycodone     Hallucinations, rash  .  Penicillins Anaphylaxis and Hives  Has patient had a PCN reaction causing immediate rash, facial/tongue/throat swelling, SOB or lightheadedness with hypotension: Yes Has patient had a PCN reaction causing severe rash involving mucus membranes or skin necrosis: No Has patient had a PCN reaction that required hospitalization No Has patient had a PCN reaction occurring within the last 10 years: No If all of the above answers are "NO", then may proceed with Cephalosporin use.   . Adhesive [Tape]     rash  . Cataflam [Diclofenac Potassium]     Lip swelling  . Clarithromycin Diarrhea  . Flagyl [Metronidazole Hcl]     ?diarrhea  . Fluzone [Flu Virus Vaccine]     Had local redness with high dose.   . Glucosamine Hives  . Guaifenesin & Derivatives     Stomach upset  . Hydrocodone-Acetaminophen     REACTION: rash, tightening of throat  . Ketorolac Tromethamine     ?reaction  . Pentazocine     Involuntary twitching  . Pneumovax [Pneumococcal Polysaccharide Vaccine] Other (See Comments)    Redness/swelling at site, nausea  . Prevnar [Pneumococcal 13-Val Conj Vacc] Other (See Comments)    Swelling, redness, nausea  . Smz-Tmp Ds [Sulfamethoxazole W/Trimethoprim (Co-Trimoxazole)]     ?unknown reaction  . Tramadol     constipation  . Trazodone And Nefazodone Other (See Comments)    Scratchy throat / congestion  . Latex Rash    Current Outpatient Medications on File Prior to Visit  Medication Sig Dispense Refill  . Azelaic Acid (FINACEA) 15 % cream Apply small amount to clean dry skin twice daily. 90 g 3  . azelastine (ASTELIN) 0.1 % nasal spray Place 1-2 sprays into both nostrils 2 (two) times daily as needed. 90 mL 3  . CALCIUM CITRATE PO Take 1 tablet by mouth daily.    . cholecalciferol (VITAMIN D) 1000 units tablet Take 1 tablet (1,000 Units total) by mouth 2 (two) times daily.    . Coenzyme Q10 200 MG capsule Take 200 mg by mouth daily.    . diclofenac sodium (VOLTAREN) 1 % GEL  APPLY 2 GRAMS TOPICALLY TWICE A DAY AS NEEDED 300 g 0  . DULoxetine (CYMBALTA) 60 MG capsule TAKE 1 CAPSULE DAILY 90 capsule 1  . EPINEPHrine (EPIPEN 2-PAK) 0.3 mg/0.3 mL IJ SOAJ injection Inject 0.3 mLs (0.3 mg total) into the muscle once. 1 Device 1  . fexofenadine (ALLEGRA) 180 MG tablet Take 1 tablet (180 mg total) by mouth daily as needed for allergies or rhinitis. 90 tablet 3  . FIBER PO Take 2 capsules by mouth 2 (two) times daily.    . fluticasone (FLONASE) 50 MCG/ACT nasal spray Place 1 spray into both nostrils daily. 48 g 1  . loratadine (CLARITIN) 10 MG tablet Take 10 mg by mouth daily as needed for allergies.    . Multiple Vitamin (MULTIVITAMIN) tablet Take 1 tablet by mouth daily.    Marland Kitchen omeprazole (PRILOSEC) 40 MG capsule 1 tablet by mouth once daily. 90 capsule 1  . pravastatin (PRAVACHOL) 40 MG tablet Take 1 tablet (40 mg total) by mouth daily. 90 tablet 1  . Probiotic Product (PROBIOTIC DAILY PO) Take 1 capsule by mouth daily.    . ranitidine (ZANTAC) 150 MG capsule Take 1 capsule (150 mg total) by mouth 2 (two) times daily as needed for heartburn. 180 capsule 3  . vitamin C (ASCORBIC ACID) 500 MG tablet Take 500 mg by mouth daily.     No current facility-administered medications on file prior  to visit.     BP 122/63 (BP Location: Right Arm, Cuff Size: Normal)   Pulse 76   Temp 97.7 F (36.5 C) (Oral)   Resp 18   Ht 5\' 4"  (1.626 m)   Wt 161 lb (73 kg)   LMP 05/14/1996   SpO2 100%   BMI 27.64 kg/m    Objective:   Physical Exam  Constitutional: She is oriented to person, place, and time. She appears well-developed and well-nourished.  Cardiovascular: Normal rate, regular rhythm and normal heart sounds.  No murmur heard. Pulmonary/Chest: Effort normal and breath sounds normal. No respiratory distress. She has no wheezes.  Musculoskeletal: She exhibits no edema.  Neurological: She is alert and oriented to person, place, and time.  Skin: Skin is warm and dry.    Psychiatric: She has a normal mood and affect. Her behavior is normal. Judgment and thought content normal.          Assessment & Plan:  Hyperlipidemia-tolerating statin.  Obtain follow-up lipid panel.  Fibromyalgia- stable on Cymbalta.  Continue same.    Depression-stable on current dose of Cymbalta continue same.  GERD-stable on current medications.  Continue same.  Obstructive sleep apnea- stable on CPAP.  Encouraged her to work on weight loss.

## 2018-05-26 ENCOUNTER — Other Ambulatory Visit: Payer: Self-pay

## 2018-05-28 ENCOUNTER — Encounter: Payer: Self-pay | Admitting: Family

## 2018-05-28 DIAGNOSIS — E663 Overweight: Secondary | ICD-10-CM

## 2018-05-30 DIAGNOSIS — F4310 Post-traumatic stress disorder, unspecified: Secondary | ICD-10-CM | POA: Diagnosis not present

## 2018-05-30 NOTE — Addendum Note (Signed)
Addended by: Debbrah Alar on: 05/30/2018 07:09 AM   Modules accepted: Orders

## 2018-06-13 DIAGNOSIS — F4310 Post-traumatic stress disorder, unspecified: Secondary | ICD-10-CM | POA: Diagnosis not present

## 2018-06-27 ENCOUNTER — Ambulatory Visit: Payer: Self-pay | Admitting: Family Medicine

## 2018-07-06 ENCOUNTER — Ambulatory Visit (INDEPENDENT_AMBULATORY_CARE_PROVIDER_SITE_OTHER): Payer: Medicare Other | Admitting: Internal Medicine

## 2018-07-06 ENCOUNTER — Encounter: Payer: Self-pay | Admitting: Internal Medicine

## 2018-07-06 VITALS — BP 120/73 | HR 70 | Temp 97.6°F | Resp 16 | Ht 64.0 in | Wt 163.8 lb

## 2018-07-06 DIAGNOSIS — M545 Low back pain, unspecified: Secondary | ICD-10-CM

## 2018-07-06 MED ORDER — CYCLOBENZAPRINE HCL 10 MG PO TABS: 10.0000 mg | ORAL_TABLET | Freq: Two times a day (BID) | ORAL | 0 refills | Status: DC | PRN

## 2018-07-06 MED ORDER — PREDNISONE 10 MG PO TABS: ORAL_TABLET | ORAL | 0 refills | Status: DC

## 2018-07-06 NOTE — Progress Notes (Signed)
Subjective:    Patient ID: Vanessa Oliver, female    DOB: [birth year], 67 y.o.   MRN: [id number]  DOS:  07/06/2018 Type of visit - description: Acute visit 5 days ago, she did some cleaning on her garage,  that included lifting 40 pounds bags. The next day, she was very sore particularly at the left lower back. Since then symptoms are on and off but starting yesterday the pain is a steady She has a history of spinal stenosis and back pain on and off but that is typically on the RIGHT side. Has taken ibuprofen with mild relief and topical Voltaren. Pain is worse with certain movements particularly if she lift her left leg. She also has fibromyalgia but this pain is different   Review of Systems No fever chills No actual fall or injuries No abdominal pain, dysuria or gross hematuria  Past Medical History:  Diagnosis Date  . Allergy    takes Singulair at night  . Anxiety   . Arthritis   . Cataract   . Chronic back pain    stenosis  . Depression    takes CYmbalta daily  . Esophageal stricture   . Family history of adverse reaction to anesthesia    oldest brother had trouble with anesthesia a long time ago but can't recall what  . Fibromyalgia   . Fibromyalgia   . GERD (gastroesophageal reflux disease)    takes Omeprazole daily  . Hemorrhoids   . History of bronchitis   . Hyperlipidemia   . IBS (irritable bowel syndrome)   . Joint pain   . Joint swelling   . Plantar fasciitis   . Rosacea   . Rosacea   . Sleep apnea    cpap- settings at 10 breathing   . TMJ (dislocation of temporomandibular joint)   . TMJ (temporomandibular joint disorder)   . Vasovagal episode back in the 80's  . Vasovagal syncope   . Weakness    right leg    Past Surgical History:  Procedure Laterality Date  . ABDOMINAL HYSTERECTOMY  1997  . CARPAL TUNNEL RELEASE Bilateral 2004  . CARPOMETACARPAL (CMC) FUSION OF THUMB  2015   thumb  . CATARACT EXTRACTION Right 2013   Dr Ellison Hughs  .  CATARACT EXTRACTION Left 04/2016  . COLONOSCOPY    . COLONOSCOPY WITH ESOPHAGOGASTRODUODENOSCOPY (EGD) AND ESOPHAGEAL DILATION (ED)    . EYE SURGERY     scar tissue removed after cataract surgery  . 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 KNEE ARTHROSCOPY Right 1994   x 3  . KNEE SURGERY Right 01/21/2006  . LUMBAR LAMINECTOMY/DECOMPRESSION MICRODISCECTOMY Right 02/11/2015   Procedure: Laminectomy and Foraminotomy - Right - L3-L4;  Surgeon: Earnie Larsson, MD;  Location: [facility];  Service: Neurosurgery;  Laterality: Right;  Laminectomy and Foraminotomy - Right - L3-L4  . mallet finger Right 2011  . NASAL SEPTUM SURGERY  1970  . NISSEN FUNDOPLICATION  4098  . RETINAL LASER PROCEDURE Right 02/01/2011   right eye   . TONSILLECTOMY  1981  . TOTAL KNEE ARTHROPLASTY Right 09/06/2016   Procedure: RIGHT TOTAL KNEE ARTHROPLASTY;  Surgeon: Gaynelle Arabian, MD;  Location: [facility];  Service: Orthopedics;  Laterality: Right;  . TRIGGER FINGER RELEASE Bilateral 2005  . UPPER GASTROINTESTINAL ENDOSCOPY    . VARICOSE VEIN SURGERY Bilateral 2007, 2009   left 2007, right 2009  . VITRECTOMY Right 2012    Social History   Socioeconomic History  .  Marital status: Married    Spouse name: Not on file  . Number of children: 0  . Years of education: Not on file  . Highest education level: Not on file  Occupational History  . Occupation: Hydrologist: [employer]  . Financial resource strain: Not on file  . Food insecurity:    Worry: Not on file    Inability: Not on file  . Transportation needs:    Medical: Not on file    Non-medical: Not on file  Tobacco Use  . Smoking status: Never Smoker  . Smokeless tobacco: Never Used  Substance and Sexual Activity  . Alcohol use: Yes    Alcohol/week: 0.0 standard drinks    Comment: rarely  . Drug use: No  . Sexual activity: Never    Birth control/protection: Surgical  Lifestyle  . Physical activity:    Days per week: Not  on file    Minutes per session: Not on file  . Stress: Not on file  Relationships  . Social connections:    Talks on phone: Not on file    Gets together: Not on file    Attends religious service: Not on file    Active member of club or organization: Not on file    Attends meetings of clubs or organizations: Not on file    Relationship status: Not on file  . Intimate partner violence:    Fear of current or ex partner: Not on file    Emotionally abused: Not on file    Physically abused: Not on file    Forced sexual activity: Not on file  Other Topics Concern  . Not on file  Social History Narrative   Works as an Glass blower/designer   Married- husband is 83 years older than her   Former Dance movement psychotherapist up up [employer]   Has cats   Enjoys reading   No children      Allergies as of 07/06/2018      Reactions   Aleve [naproxen Sodium]    Swelling, itching   Cefuroxime Axetil    Hives / "throat swelling"   Chlorzoxazone Rash, Other (See Comments)   "breathing problems"    Oxycodone    Hallucinations, rash   Penicillins Anaphylaxis, Hives   Has patient had a PCN reaction causing immediate rash, facial/tongue/throat swelling, SOB or lightheadedness with hypotension: Yes Has patient had a PCN reaction causing severe rash involving mucus membranes or skin necrosis: No Has patient had a PCN reaction that required hospitalization No Has patient had a PCN reaction occurring within the last 10 years: No If all of the above answers are "NO", then may proceed with Cephalosporin use.   Adhesive [tape]    rash   Cataflam [diclofenac Potassium]    Lip swelling   Clarithromycin Diarrhea   Flagyl [metronidazole Hcl]    ?diarrhea   Fluzone [flu Virus Vaccine]    Had local redness with high dose.    Glucosamine Hives   Guaifenesin & Derivatives    Stomach upset   Hydrocodone-acetaminophen    REACTION: rash, tightening of throat   Ketorolac Tromethamine    ?reaction   Pentazocine     Involuntary twitching   Pneumovax [pneumococcal Polysaccharide Vaccine] Other (See Comments)   Redness/swelling at site, nausea   Prevnar [pneumococcal 13-val Conj Vacc] Other (See Comments)   Swelling, redness, nausea   Smz-tmp Ds [sulfamethoxazole W/trimethoprim (co-trimoxazole)]    ?unknown reaction  Tramadol    constipation   Trazodone And Nefazodone Other (See Comments)   Scratchy throat / congestion   Latex Rash      Medication List       Accurate as of July 06, 2018 11:59 PM. Always use your most recent med list.        Azelaic Acid 15 % cream Commonly known as:  FINACEA Apply small amount to clean dry skin twice daily.   azelastine 0.1 % nasal spray Commonly known as:  ASTELIN Place 1-2 sprays into both nostrils 2 (two) times daily as needed.   CALCIUM CITRATE PO Take 1 tablet by mouth daily.   cholecalciferol 1000 units tablet Commonly known as:  VITAMIN D Take 1 tablet (1,000 Units total) by mouth 2 (two) times daily.   Coenzyme Q10 200 MG capsule Take 200 mg by mouth daily.   cyclobenzaprine 10 MG tablet Commonly known as:  FLEXERIL Take 1 tablet (10 mg total) by mouth 2 (two) times daily as needed for muscle spasms.   diclofenac sodium 1 % Gel Commonly known as:  VOLTAREN APPLY 2 GRAMS TOPICALLY TWICE A DAY AS NEEDED   DULoxetine 60 MG capsule Commonly known as:  CYMBALTA TAKE 1 CAPSULE DAILY   EPINEPHrine 0.3 mg/0.3 mL Soaj injection Commonly known as:  EPIPEN 2-PAK Inject 0.3 mLs (0.3 mg total) into the muscle once.   famotidine 20 MG tablet Commonly known as:  PEPCID Take 20 mg by mouth daily.   fexofenadine 180 MG tablet Commonly known as:  ALLEGRA Take 1 tablet (180 mg total) by mouth daily as needed for allergies or rhinitis.   FIBER PO Take 2 capsules by mouth 2 (two) times daily.   fluticasone 50 MCG/ACT nasal spray Commonly known as:  FLONASE Place 1 spray into both nostrils daily.   loratadine 10 MG tablet Commonly  known as:  CLARITIN Take 10 mg by mouth daily as needed for allergies.   multivitamin tablet Take 1 tablet by mouth daily.   omeprazole 40 MG capsule Commonly known as:  PRILOSEC 1 tablet by mouth once daily.   pravastatin 40 MG tablet Commonly known as:  PRAVACHOL Take 1 tablet (40 mg total) by mouth daily.   predniSONE 10 MG tablet Commonly known as:  DELTASONE 5 tablets x 2 days , 4 tablets x 2 days, 3 tabs x 2 days, 2 tabs x 2 days, 1 tab x 2 days   PROBIOTIC DAILY PO Take 1 capsule by mouth daily.   vitamin C 500 MG tablet Commonly known as:  ASCORBIC ACID Take 500 mg by mouth daily.           Objective:   Physical Exam BP 120/73   Pulse 70   Temp 97.6 F (36.4 C) (Oral)   Resp 16   Ht 5\' 4"  (1.626 m)   Wt 163 lb 12.8 oz (74.3 kg)   LMP 05/14/1996   SpO2 100%   BMI 28.12 kg/m   General:   Well developed, NAD, BMI noted.  HEENT:  Normocephalic . Face symmetric, atraumatic Abdomen:  Not distended, soft, non-tender. No rebound or rigidity. MSK: No TTP at the lumbosacral spine.  No TTP at the sacroiliac joints + TTP bilaterally, symmetrically at the trochanteric bursa's. Hip rotation normal bilaterally Skin: Not pale. Not jaundice Neurologic:  alert & oriented X3.  Speech normal, gait appropriate for age and unassisted.  DTR symmetric.  Straight leg test negative Psych--  Cognition and judgment appear intact.  Cooperative with normal attention span and concentration.  Behavior appropriate. No anxious or depressed appearing.    \ Assessment     67 year old female, PMH includes allergies, high cholesterol, fibromyalgia, chronic back pain with stenosis, presents with  Back pain: As described above, suspect a back sprain from heavy lifting. The patient is very busy, takes care of 2 other people. We will try to provide quick relief with prednisone for few days. She uses ibuprofen with some help and will continue with it, GI precautions  discussed. Also Tylenol, and a muscle relaxant, warnings about drowsiness provided. Warm or cool compresses. Call if not better.

## 2018-07-06 NOTE — Patient Instructions (Signed)
Rest  Warm or cool compresses  Take prednisone as prescribed for few days  IBUPROFEN (Advil or Motrin) 200 mg 2 tablets every 6 hours as needed for pain.  Always take it with food because may cause gastritis and ulcers.  If you notice nausea, stomach pain, change in the color of stools --->  Stop the medicine and let us know  Tylenol  500 mg OTC 2 tabs a day every 8 hours as needed for pain  Take the muscle relaxant Flexeril, will cause drowsiness please be careful  Call if not gradually better

## 2018-07-10 ENCOUNTER — Ambulatory Visit: Payer: Self-pay | Admitting: Family Medicine

## 2018-07-11 DIAGNOSIS — F4310 Post-traumatic stress disorder, unspecified: Secondary | ICD-10-CM | POA: Diagnosis not present

## 2018-07-24 DIAGNOSIS — F4310 Post-traumatic stress disorder, unspecified: Secondary | ICD-10-CM | POA: Diagnosis not present

## 2018-08-02 ENCOUNTER — Encounter: Payer: Self-pay | Admitting: Family

## 2018-08-02 ENCOUNTER — Ambulatory Visit (INDEPENDENT_AMBULATORY_CARE_PROVIDER_SITE_OTHER): Payer: Medicare Other | Admitting: Family

## 2018-08-02 VITALS — BP 124/60 | HR 79 | Temp 98.5°F | Resp 16 | Ht 64.0 in | Wt 163.0 lb

## 2018-08-02 DIAGNOSIS — R5383 Other fatigue: Secondary | ICD-10-CM | POA: Diagnosis not present

## 2018-08-02 DIAGNOSIS — M2550 Pain in unspecified joint: Secondary | ICD-10-CM

## 2018-08-02 DIAGNOSIS — M791 Myalgia, unspecified site: Secondary | ICD-10-CM

## 2018-08-02 LAB — COMPREHENSIVE METABOLIC PANEL
ALK PHOS: 76 U/L (ref 39–117)
ALT: 12 U/L (ref 0–35)
AST: 15 U/L (ref 0–37)
Albumin: 4.5 g/dL (ref 3.5–5.2)
BUN: 17 mg/dL (ref 6–23)
CO2: 29 mEq/L (ref 19–32)
Calcium: 10.2 mg/dL (ref 8.4–10.5)
Chloride: 103 mEq/L (ref 96–112)
Creatinine, Ser: 0.7 mg/dL (ref 0.40–1.20)
GFR: 82.95 mL/min (ref >60.00)
Glucose, Bld: 88 mg/dL (ref 70–99)
Potassium: 5 mEq/L (ref 3.5–5.1)
Sodium: 139 mEq/L (ref 135–145)
TOTAL PROTEIN: 6.8 g/dL (ref 6.0–8.3)
Total Bilirubin: 0.7 mg/dL (ref 0.2–1.2)

## 2018-08-02 LAB — CBC WITH DIFFERENTIAL/PLATELET
Basophils Absolute: 0.1 10*3/uL (ref 0.0–0.1)
Basophils Relative: 1.2 % (ref 0.0–3.0)
Eosinophils Absolute: 0.2 10*3/uL (ref 0.0–0.7)
Eosinophils Relative: 3.6 % (ref 0.0–5.0)
HEMATOCRIT: 44.8 % (ref 36.0–46.0)
Hemoglobin: 15.4 g/dL — ABNORMAL HIGH (ref 12.0–15.0)
Lymphocytes Relative: 31.1 % (ref 12.0–46.0)
Lymphs Abs: 1.6 10*3/uL (ref 0.7–4.0)
MCHC: 34.3 g/dL (ref 30.0–36.0)
MCV: 93.3 fl (ref 78.0–100.0)
MONOS PCT: 8 % (ref 3.0–12.0)
Monocytes Absolute: 0.4 10*3/uL (ref 0.1–1.0)
Neutro Abs: 2.9 10*3/uL (ref 1.4–7.7)
Neutrophils Relative %: 56.1 % (ref 43.0–77.0)
Platelets: 328 10*3/uL (ref 150.0–400.0)
RBC: 4.8 Mil/uL (ref 3.87–5.11)
RDW: 13.4 % (ref 11.5–15.5)
WBC: 5.1 10*3/uL (ref 4.0–10.5)

## 2018-08-02 LAB — TSH: TSH: 1.89 u[IU]/mL (ref 0.35–4.50)

## 2018-08-02 LAB — SEDIMENTATION RATE: Sed Rate: 6 mm/hr (ref 0–30)

## 2018-08-02 NOTE — Progress Notes (Signed)
Subjective:    Patient ID: Vanessa Oliver, female    DOB: Nov 13, 1950, 68 y.o.   MRN: [id number]  HPI  Patient is a 68 yr old female who presents today with chief complaint of fatigue. Reports all over myalgia, hips and shoulders help when she sleeps.  Tried ibuprofen and benadryl without improvement. Fatigue began on Friday.   Denies cough, fever, sore throat.    Review of Systems See HPI  Past Medical History:  Diagnosis Date  . Allergy    takes Singulair at night  . Anxiety   . Arthritis   . Cataract   . Chronic back pain    stenosis  . Depression    takes CYmbalta daily  . Esophageal stricture   . Family history of adverse reaction to anesthesia    oldest brother had trouble with anesthesia a long time ago but can't recall what  . Fibromyalgia   . Fibromyalgia   . GERD (gastroesophageal reflux disease)    takes Omeprazole daily  . Hemorrhoids   . History of bronchitis   . Hyperlipidemia   . IBS (irritable bowel syndrome)   . Joint pain   . Joint swelling   . Plantar fasciitis   . Rosacea   . Rosacea   . Sleep apnea    cpap- settings at 10 breathing   . TMJ (dislocation of temporomandibular joint)   . TMJ (temporomandibular joint disorder)   . Vasovagal episode back in the 80's  . Vasovagal syncope   . Weakness    right leg     Social History   Socioeconomic History  . Marital status: Married    Spouse name: Not on file  . Number of children: 0  . Years of education: Not on file  . Highest education level: Not on file  Occupational History  . Occupation: Hydrologist: [employer]  . Financial resource strain: Not on file  . Food insecurity:    Worry: Not on file    Inability: Not on file  . Transportation needs:    Medical: Not on file    Non-medical: Not on file  Tobacco Use  . Smoking status: Never Smoker  . Smokeless tobacco: Never Used  Substance and Sexual Activity  . Alcohol use: Yes    Alcohol/week: 0.0  standard drinks    Comment: rarely  . Drug use: No  . Sexual activity: Never    Birth control/protection: Surgical  Lifestyle  . Physical activity:    Days per week: Not on file    Minutes per session: Not on file  . Stress: Not on file  Relationships  . Social connections:    Talks on phone: Not on file    Gets together: Not on file    Attends religious service: Not on file    Active member of club or organization: Not on file    Attends meetings of clubs or organizations: Not on file    Relationship status: Not on file  . Intimate partner violence:    Fear of current or ex partner: Not on file    Emotionally abused: Not on file    Physically abused: Not on file    Forced sexual activity: Not on file  Other Topics Concern  . Not on file  Social History Narrative   Works as an Glass blower/designer   Married- husband is 59 years older than her   Former Dance movement psychotherapist  up up [place]   Has cats   Enjoys reading   No children    Past Surgical History:  Procedure Laterality Date  . ABDOMINAL HYSTERECTOMY  1997  . CARPAL TUNNEL RELEASE Bilateral 2004  . CARPOMETACARPAL (CMC) FUSION OF THUMB  2015   thumb  . CATARACT EXTRACTION Right 2013   Dr Ellison Hughs  . CATARACT EXTRACTION Left 04/2016  . COLONOSCOPY    . COLONOSCOPY WITH ESOPHAGOGASTRODUODENOSCOPY (EGD) AND ESOPHAGEAL DILATION (ED)    . EYE SURGERY     scar tissue removed after cataract surgery  . 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 KNEE ARTHROSCOPY Right 1994   x 3  . KNEE SURGERY Right 01/21/2006  . LUMBAR LAMINECTOMY/DECOMPRESSION MICRODISCECTOMY Right 02/11/2015   Procedure: Laminectomy and Foraminotomy - Right - L3-L4;  Surgeon: Earnie Larsson, MD;  Location: [facility];  Service: Neurosurgery;  Laterality: Right;  Laminectomy and Foraminotomy - Right - L3-L4  . mallet finger Right 2011  . NASAL SEPTUM SURGERY  1970  . NISSEN FUNDOPLICATION  8841  . RETINAL LASER PROCEDURE Right 02/01/2011   right eye   .  TONSILLECTOMY  1981  . TOTAL KNEE ARTHROPLASTY Right 09/06/2016   Procedure: RIGHT TOTAL KNEE ARTHROPLASTY;  Surgeon: Gaynelle Arabian, MD;  Location: [facility];  Service: Orthopedics;  Laterality: Right;  . TRIGGER FINGER RELEASE Bilateral 2005  . UPPER GASTROINTESTINAL ENDOSCOPY    . VARICOSE VEIN SURGERY Bilateral 2007, 2009   left 2007, right 2009  . VITRECTOMY Right 2012    Family History  Problem Relation Age of Onset  . Breast cancer Mother   . Arthritis Mother   . Hyperlipidemia Mother   . Hypertension Mother   . Allergic rhinitis Mother   . Urticaria Mother   . Lung cancer Father   . Pancreatic cancer Father   . Skin cancer Father   . Arthritis Father   . Allergic rhinitis Father   . Emphysema Father   . Lymphoma Brother   . Celiac disease Brother   . Hypertension Brother   . Thyroid disease Brother   . Allergic rhinitis Brother   . Hyperlipidemia Maternal Grandfather   . Heart disease Maternal Grandfather   . Stroke Maternal Grandfather   . Eczema Maternal Aunt   . Asthma Neg Hx   . Immunodeficiency Neg Hx   . Angioedema Neg Hx   . Colon cancer Neg Hx   . Colon polyps Neg Hx   . Esophageal cancer Neg Hx   . Rectal cancer Neg Hx   . Stomach cancer Neg Hx     Allergies  Allergen Reactions  . Aleve [Naproxen Sodium]     Swelling, itching  . Cefuroxime Axetil     Hives / "throat swelling"  . Chlorzoxazone Rash and Other (See Comments)    "breathing problems"   . Oxycodone     Hallucinations, rash  . Penicillins Anaphylaxis and Hives    Has patient had a PCN reaction causing immediate rash, facial/tongue/throat swelling, SOB or lightheadedness with hypotension: Yes Has patient had a PCN reaction causing severe rash involving mucus membranes or skin necrosis: No Has patient had a PCN reaction that required hospitalization No Has patient had a PCN reaction occurring within the last 10 years: No If all of the above answers are "NO", then may proceed with  Cephalosporin use.   . Adhesive [Tape]     rash  . Cataflam [Diclofenac Potassium]     Lip  swelling  . Clarithromycin Diarrhea  . Flagyl [Metronidazole Hcl]     ?diarrhea  . Fluzone [Flu Virus Vaccine]     Had local redness with high dose.   . Glucosamine Hives  . Guaifenesin & Derivatives     Stomach upset  . Hydrocodone-Acetaminophen     REACTION: rash, tightening of throat  . Ketorolac Tromethamine     ?reaction  . Pentazocine     Involuntary twitching  . Pneumovax [Pneumococcal Polysaccharide Vaccine] Other (See Comments)    Redness/swelling at site, nausea  . Prevnar [Pneumococcal 13-Val Conj Vacc] Other (See Comments)    Swelling, redness, nausea  . Smz-Tmp Ds [Sulfamethoxazole W/Trimethoprim (Co-Trimoxazole)]     ?unknown reaction  . Tramadol     constipation  . Trazodone And Nefazodone Other (See Comments)    Scratchy throat / congestion  . Latex Rash    Current Outpatient Medications on File Prior to Visit  Medication Sig Dispense Refill  . CALCIUM CITRATE PO Take 1 tablet by mouth daily.    . cholecalciferol (VITAMIN D) 1000 units tablet Take 1 tablet (1,000 Units total) by mouth 2 (two) times daily.    . Coenzyme Q10 200 MG capsule Take 200 mg by mouth daily.    . diclofenac sodium (VOLTAREN) 1 % GEL APPLY 2 GRAMS TOPICALLY TWICE A DAY AS NEEDED 300 g 0  . DULoxetine (CYMBALTA) 60 MG capsule TAKE 1 CAPSULE DAILY 90 capsule 1  . EPINEPHrine (EPIPEN 2-PAK) 0.3 mg/0.3 mL IJ SOAJ injection Inject 0.3 mLs (0.3 mg total) into the muscle once. 1 Device 1  . famotidine (PEPCID) 20 MG tablet Take 20 mg by mouth daily.    . fexofenadine (ALLEGRA) 180 MG tablet Take 1 tablet (180 mg total) by mouth daily as needed for allergies or rhinitis. 90 tablet 3  . FIBER PO Take 2 capsules by mouth 2 (two) times daily.    . fluticasone (FLONASE) 50 MCG/ACT nasal spray Place 1 spray into both nostrils daily. 48 g 1  . loratadine (CLARITIN) 10 MG tablet Take 10 mg by mouth daily as  needed for allergies.    . Multiple Vitamin (MULTIVITAMIN) tablet Take 1 tablet by mouth daily.    Marland Kitchen omeprazole (PRILOSEC) 40 MG capsule 1 tablet by mouth once daily. 90 capsule 1  . pravastatin (PRAVACHOL) 40 MG tablet Take 1 tablet (40 mg total) by mouth daily. 90 tablet 1  . Probiotic Product (PROBIOTIC DAILY PO) Take 1 capsule by mouth daily.    . vitamin C (ASCORBIC ACID) 500 MG tablet Take 500 mg by mouth daily.     No current facility-administered medications on file prior to visit.     BP 124/60 (BP Location: Left Arm, Patient Position: Sitting, Cuff Size: Small)   Pulse 79   Temp 98.5 F (36.9 C) (Oral)   Resp 16   Ht _0  (1.626 m)   Wt 163 lb (73.9 kg)   LMP 05/14/1996   SpO2 100%   BMI 27.98 kg/m       Objective:   Physical Exam Constitutional:      Appearance: She is well-developed.  Neck:     Musculoskeletal: Neck supple.     Thyroid: No thyromegaly.  Cardiovascular:     Rate and Rhythm: Normal rate and regular rhythm.     Heart sounds: Normal heart sounds. No murmur.  Pulmonary:     Effort: Pulmonary effort is normal. No respiratory distress.     Breath sounds: Normal  breath sounds. No wheezing.  Skin:    General: Skin is warm and dry.  Neurological:     Mental Status: She is alert and oriented to person, place, and time.  Psychiatric:        Behavior: Behavior normal.        Thought Content: Thought content normal.        Judgment: Judgment normal.           Assessment & Plan:  Arthralgia/myalgia-   Will also obtain ANA,ESR, Rheumatoid factor to evaluate for autoimmune etiology.   Fatigue- check cbc, cmet, TSH. We discussed that she may have overdone it with a lot of housework last week. Discussed taking it easy. Could also be fibromyalgia flare- will continue cymbalta.  Underlying viral etiology is also a possibility. I have advised the patient to call if new/worsening symptoms or if not improved in 1 week. Pt verbalizes understanding.

## 2018-08-02 NOTE — Patient Instructions (Signed)
Please complete lab work prior to leaving.   Call if new/worsening symptoms or if not improved in 1 week.

## 2018-08-03 LAB — ANA: Anti Nuclear Antibody(ANA): NEGATIVE

## 2018-08-03 LAB — RHEUMATOID FACTOR: Rheumatoid fact SerPl-aCnc: <14 IU/mL (ref <14)

## 2018-08-07 ENCOUNTER — Encounter: Payer: Self-pay | Admitting: Family

## 2018-08-07 DIAGNOSIS — F4310 Post-traumatic stress disorder, unspecified: Secondary | ICD-10-CM | POA: Diagnosis not present

## 2018-08-08 ENCOUNTER — Encounter: Payer: Self-pay | Admitting: Family

## 2018-08-08 MED ORDER — DULOXETINE HCL 60 MG PO CPEP: 60.0000 mg | ORAL_CAPSULE | Freq: Every day | ORAL | 1 refills | Status: DC

## 2018-08-09 ENCOUNTER — Encounter: Payer: Self-pay | Admitting: Family Medicine

## 2018-08-09 ENCOUNTER — Ambulatory Visit (INDEPENDENT_AMBULATORY_CARE_PROVIDER_SITE_OTHER): Payer: Medicare Other | Admitting: Family Medicine

## 2018-08-09 ENCOUNTER — Ambulatory Visit: Payer: Medicare Other | Admitting: Allergy and Immunology

## 2018-08-09 VITALS — BP 137/72 | HR 86 | Ht 64.0 in | Wt 160.0 lb

## 2018-08-09 DIAGNOSIS — M25511 Pain in right shoulder: Secondary | ICD-10-CM

## 2018-08-09 NOTE — Progress Notes (Signed)
PCP and consultation requested by: Debbrah Alar, NP  Subjective:   HPI: Patient is a 68 y.o. female here for right shoulder pain.  Patient reports she's had about 5-6 years of posterior right shoulder pain. Worse with sitting at the computer, with overhead motions, and wakes her up at night. Was working with a trainer at one point and this helped some. Neck is bothering her too. Uses a massager and voltaren gel with minimal relief. Pain 4/10 currently but up to 8/10 and sharp at worst. No bowel/bladder dysfunction. No skin changes, numbness.  Past Medical History:  Diagnosis Date  . Allergy    takes Singulair at night  . Anxiety   . Arthritis   . Cataract   . Chronic back pain    stenosis  . Depression    takes CYmbalta daily  . Esophageal stricture   . Family history of adverse reaction to anesthesia    oldest brother had trouble with anesthesia a long time ago but can't recall what  . Fibromyalgia   . Fibromyalgia   . GERD (gastroesophageal reflux disease)    takes Omeprazole daily  . Hemorrhoids   . History of bronchitis   . Hyperlipidemia   . IBS (irritable bowel syndrome)   . Joint pain   . Joint swelling   . Plantar fasciitis   . Rosacea   . Rosacea   . Sleep apnea    cpap- settings at 10 breathing   . TMJ (dislocation of temporomandibular joint)   . TMJ (temporomandibular joint disorder)   . Vasovagal episode back in the 80's  . Vasovagal syncope   . Weakness    right leg    Current Outpatient Medications on File Prior to Visit  Medication Sig Dispense Refill  . CALCIUM CITRATE PO Take 1 tablet by mouth daily.    . cholecalciferol (VITAMIN D) 1000 units tablet Take 1 tablet (1,000 Units total) by mouth 2 (two) times daily.    . Coenzyme Q10 200 MG capsule Take 200 mg by mouth daily.    . diclofenac sodium (VOLTAREN) 1 % GEL APPLY 2 GRAMS TOPICALLY TWICE A DAY AS NEEDED 300 g 0  . DULoxetine (CYMBALTA) 60 MG capsule Take 1 capsule (60 mg total)  by mouth daily. 90 capsule 1  . EPINEPHrine (EPIPEN 2-PAK) 0.3 mg/0.3 mL IJ SOAJ injection Inject 0.3 mLs (0.3 mg total) into the muscle once. 1 Device 1  . famotidine (PEPCID) 20 MG tablet Take 20 mg by mouth daily.    . fexofenadine (ALLEGRA) 180 MG tablet Take 1 tablet (180 mg total) by mouth daily as needed for allergies or rhinitis. 90 tablet 3  . FIBER PO Take 2 capsules by mouth 2 (two) times daily.    . fluticasone (FLONASE) 50 MCG/ACT nasal spray Place 1 spray into both nostrils daily. 48 g 1  . loratadine (CLARITIN) 10 MG tablet Take 10 mg by mouth daily as needed for allergies.    . Multiple Vitamin (MULTIVITAMIN) tablet Take 1 tablet by mouth daily.    Marland Kitchen omeprazole (PRILOSEC) 40 MG capsule 1 tablet by mouth once daily. 90 capsule 1  . pravastatin (PRAVACHOL) 40 MG tablet Take 1 tablet (40 mg total) by mouth daily. 90 tablet 1  . Probiotic Product (PROBIOTIC DAILY PO) Take 1 capsule by mouth daily.    . vitamin C (ASCORBIC ACID) 500 MG tablet Take 500 mg by mouth daily.     No current facility-administered medications on file prior to  visit.     Past Surgical History:  Procedure Laterality Date  . ABDOMINAL HYSTERECTOMY  1997  . CARPAL TUNNEL RELEASE Bilateral 2004  . CARPOMETACARPAL (CMC) FUSION OF THUMB  2015   thumb  . CATARACT EXTRACTION Right 2013   Dr Ellison Hughs  . CATARACT EXTRACTION Left 04/2016  . COLONOSCOPY    . COLONOSCOPY WITH ESOPHAGOGASTRODUODENOSCOPY (EGD) AND ESOPHAGEAL DILATION (ED)    . EYE SURGERY     scar tissue removed after cataract surgery  . 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 KNEE ARTHROSCOPY Right 1994   x 3  . KNEE SURGERY Right 01/21/2006  . LUMBAR LAMINECTOMY/DECOMPRESSION MICRODISCECTOMY Right 02/11/2015   Procedure: Laminectomy and Foraminotomy - Right - L3-L4;  Surgeon: Earnie Larsson, MD;  Location: [place] [facility];  Service: Neurosurgery;  Laterality: Right;  Laminectomy and Foraminotomy - Right - L3-L4  . mallet finger Right 2011  .  NASAL SEPTUM SURGERY  1970  . NISSEN FUNDOPLICATION  1448  . RETINAL LASER PROCEDURE Right 02/01/2011   right eye   . TONSILLECTOMY  1981  . TOTAL KNEE ARTHROPLASTY Right 09/06/2016   Procedure: RIGHT TOTAL KNEE ARTHROPLASTY;  Surgeon: Gaynelle Arabian, MD;  Location: [facility];  Service: Orthopedics;  Laterality: Right;  . TRIGGER FINGER RELEASE Bilateral 2005  . UPPER GASTROINTESTINAL ENDOSCOPY    . VARICOSE VEIN SURGERY Bilateral 2007, 2009   left 2007, right 2009  . VITRECTOMY Right 2012    Allergies  Allergen Reactions  . Aleve [Naproxen Sodium]     Swelling, itching  . Cefuroxime Axetil     Hives / "throat swelling"  . Chlorzoxazone Rash and Other (See Comments)    "breathing problems"   . Oxycodone     Hallucinations, rash  . Penicillins Anaphylaxis and Hives    Has patient had a PCN reaction causing immediate rash, facial/tongue/throat swelling, SOB or lightheadedness with hypotension: Yes Has patient had a PCN reaction causing severe rash involving mucus membranes or skin necrosis: No Has patient had a PCN reaction that required hospitalization No Has patient had a PCN reaction occurring within the last 10 years: No If all of the above answers are "NO", then may proceed with Cephalosporin use.   . Adhesive [Tape]     rash  . Cataflam [Diclofenac Potassium]     Lip swelling  . Clarithromycin Diarrhea  . Flagyl [Metronidazole Hcl]     ?diarrhea  . Fluzone [Flu Virus Vaccine]     Had local redness with high dose.   . Glucosamine Hives  . Guaifenesin & Derivatives     Stomach upset  . Hydrocodone-Acetaminophen     REACTION: rash, tightening of throat  . Ketorolac Tromethamine     ?reaction  . Pentazocine     Involuntary twitching  . Pneumovax [Pneumococcal Polysaccharide Vaccine] Other (See Comments)    Redness/swelling at site, nausea  . Prevnar [Pneumococcal 13-Val Conj Vacc] Other (See Comments)    Swelling, redness, nausea  . Smz-Tmp Ds [Sulfamethoxazole  W/Trimethoprim (Co-Trimoxazole)]     ?unknown reaction  . Tramadol     constipation  . Trazodone And Nefazodone Other (See Comments)    Scratchy throat / congestion  . Latex Rash    Social History   Socioeconomic History  . Marital status: Married    Spouse name: Not on file  . Number of children: 0  . Years of education: Not on file  . Highest education level: Not on file  Occupational History  .  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 Financial resource strain: Not on file  . Food insecurity:    Worry: Not on file    Inability: Not on file  . Transportation needs:    Medical: Not on file    Non-medical: Not on file  Tobacco Use  . Smoking status: Never Smoker  . Smokeless tobacco: Never Used  Substance and Sexual Activity  . Alcohol use: Yes    Alcohol/week: 0.0 standard drinks    Comment: rarely  . Drug use: No  . Sexual activity: Never    Birth control/protection: Surgical  Lifestyle  . Physical activity:    Days per week: Not on file    Minutes per session: Not on file  . Stress: Not on file  Relationships  . Social connections:    Talks on phone: Not on file    Gets together: Not on file    Attends religious service: Not on file    Active member of club or organization: Not on file    Attends meetings of clubs or organizations: Not on file    Relationship status: Not on file  . Intimate partner violence:    Fear of current or ex partner: Not on file    Emotionally abused: Not on file    Physically abused: Not on file    Forced sexual activity: Not on file  Other Topics Concern  . Not on file  Social History Narrative   Works as an Glass blower/designer   Married- husband is 8 years older than her   Former Dance movement psychotherapist up up [employer]   Has cats   Enjoys reading   No children    Family History  Problem Relation Age of Onset  . Breast cancer Mother   . Arthritis Mother   . Hyperlipidemia Mother   . Hypertension Mother   .  Allergic rhinitis Mother   . Urticaria Mother   . Lung cancer Father   . Pancreatic cancer Father   . Skin cancer Father   . Arthritis Father   . Allergic rhinitis Father   . Emphysema Father   . Lymphoma Brother   . Celiac disease Brother   . Hypertension Brother   . Thyroid disease Brother   . Allergic rhinitis Brother   . Hyperlipidemia Maternal Grandfather   . Heart disease Maternal Grandfather   . Stroke Maternal Grandfather   . Eczema Maternal Aunt   . Asthma Neg Hx   . Immunodeficiency Neg Hx   . Angioedema Neg Hx   . Colon cancer Neg Hx   . Colon polyps Neg Hx   . Esophageal cancer Neg Hx   . Rectal cancer Neg Hx   . Stomach cancer Neg Hx     BP 137/72   Pulse 86   Ht 5\' 4"  (1.626 m)   Wt 160 lb (72.6 kg)   LMP 05/14/1996   BMI 27.46 kg/m   Review of Systems: See HPI above.     Objective:  Physical Exam:  Gen: NAD, comfortable in exam room  Neck: No gross deformity, swelling, bruising. TTP bilateral paraspinal cervical regions, trapezius, rhomboids.  No midline/bony TTP. FROM. BUE strength 5/5.   Sensation intact to light touch.   2+ equal reflexes in triceps, biceps, brachioradialis tendons. Negative spurlings. NV intact distal BUEs.  Right shoulder: No swelling, ecchymoses.  No gross deformity. No TTP. FROM. Negative Hawkins, Neers. Negative Yergasons. Strength 5/5  with empty can and resisted internal/external rotation. Negative apprehension. NV intact distally.  Left shoulder: No swelling, ecchymoses.  No gross deformity. No TTP. FROM. Strength 5/5 with empty can and resisted internal/external rotation. NV intact distally.   Assessment & Plan:  1. Neck/upper back pain - exam reassuring.  Consistent with strain/spasms of trapezius, scapular stabilizers, rhomboids.  Start physical therapy and home exercises.  Heat.  Topical medications reviewed, salon pas patches.  F/u in 5-6 weeks for reevaluation.

## 2018-08-09 NOTE — Patient Instructions (Signed)
Your exam is reassuring.  This is consistent with strain/spasms of your trapezius, scapular stabilizers, rhomboid muscles in your upper back to shoulder. Start physical therapy and do home exercises/stretches on days you don't go to therapy. Heat 15 minutes at a time 3-4 times a day. Topical capsaicin, biofreeze, or aspercreme up to 4 times a day may be helpful if the voltaren gel isn't helping. Salon pas patches may provide relief as well. Follow up with me in 5-6 weeks. If not improving would consider imaging of your neck.

## 2018-08-16 ENCOUNTER — Other Ambulatory Visit: Payer: Self-pay

## 2018-08-16 ENCOUNTER — Encounter: Payer: Self-pay | Admitting: Physical Therapy

## 2018-08-16 ENCOUNTER — Ambulatory Visit: Payer: Medicare Other | Attending: Family Medicine | Admitting: Physical Therapy

## 2018-08-16 DIAGNOSIS — R293 Abnormal posture: Secondary | ICD-10-CM | POA: Diagnosis not present

## 2018-08-16 DIAGNOSIS — M542 Cervicalgia: Secondary | ICD-10-CM | POA: Diagnosis not present

## 2018-08-16 DIAGNOSIS — R29898 Other symptoms and signs involving the musculoskeletal system: Secondary | ICD-10-CM | POA: Diagnosis not present

## 2018-08-16 DIAGNOSIS — M25611 Stiffness of right shoulder, not elsewhere classified: Secondary | ICD-10-CM | POA: Diagnosis not present

## 2018-08-16 DIAGNOSIS — G8929 Other chronic pain: Secondary | ICD-10-CM | POA: Insufficient documentation

## 2018-08-16 DIAGNOSIS — M25511 Pain in right shoulder: Secondary | ICD-10-CM | POA: Diagnosis not present

## 2018-08-16 NOTE — Therapy (Signed)
[facility] [address]  [place], [place], [address] Phone: [phone]   Fax:  [phone]  Physical Therapy Evaluation  Patient Details  Name: Vanessa Oliver MRN: [id number] Date of Birth: [birth year] Referring Provider (PT): Karlton Lemon, MD   Encounter Date: 08/16/2068  PT End of Session - 08/16/18 1144    Visit Number  1    Number of Visits  13    Date for PT Re-Evaluation  09/27/18    Authorization Type  Medicare & Tricare    PT Start Time  1058    PT Stop Time  1140    PT Time Calculation (min)  42 min    Activity Tolerance  Patient tolerated treatment well    Behavior During Therapy  [facility] for tasks assessed/performed       Past Medical History:  Diagnosis Date  . Allergy    takes Singulair at night  . Anxiety   . Arthritis   . Cataract   . Chronic back pain    stenosis  . Depression    takes CYmbalta daily  . Esophageal stricture   . Family history of adverse reaction to anesthesia    oldest brother had trouble with anesthesia a long time ago but can't recall what  . Fibromyalgia   . Fibromyalgia   . GERD (gastroesophageal reflux disease)    takes Omeprazole daily  . Hemorrhoids   . History of bronchitis   . Hyperlipidemia   . IBS (irritable bowel syndrome)   . Joint pain   . Joint swelling   . Plantar fasciitis   . Rosacea   . Rosacea   . Sleep apnea    cpap- settings at 10 breathing   . TMJ (dislocation of temporomandibular joint)   . TMJ (temporomandibular joint disorder)   . Vasovagal episode back in the 70's  . Vasovagal syncope   . Weakness    right leg    Past Surgical History:  Procedure Laterality Date  . ABDOMINAL HYSTERECTOMY  1997  . CARPAL TUNNEL RELEASE Bilateral 2004  . CARPOMETACARPAL (CMC) FUSION OF THUMB  2015   thumb  . CATARACT EXTRACTION Right 2013   Dr Ellison Hughs  . CATARACT EXTRACTION Left 04/2016  . COLONOSCOPY    . COLONOSCOPY WITH  ESOPHAGOGASTRODUODENOSCOPY (EGD) AND ESOPHAGEAL DILATION (ED)    . EYE SURGERY     scar tissue removed after cataract surgery  . 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 KNEE ARTHROSCOPY Right 1994   x 3  . KNEE SURGERY Right 01/21/2006  . LUMBAR LAMINECTOMY/DECOMPRESSION MICRODISCECTOMY Right 02/11/2015   Procedure: Laminectomy and Foraminotomy - Right - L3-L4;  Surgeon: Earnie Larsson, MD;  Location: [facility];  Service: Neurosurgery;  Laterality: Right;  Laminectomy and Foraminotomy - Right - L3-L4  . mallet finger Right 2011  . NASAL SEPTUM SURGERY  1970  . NISSEN FUNDOPLICATION  5035  . RETINAL LASER PROCEDURE Right 02/01/2011   right eye   . TONSILLECTOMY  1981  . TOTAL KNEE ARTHROPLASTY Right 09/06/2016   Procedure: RIGHT TOTAL KNEE ARTHROPLASTY;  Surgeon: Gaynelle Arabian, MD;  Location: [facility];  Service: Orthopedics;  Laterality: Right;  . TRIGGER FINGER RELEASE Bilateral 2005  . UPPER GASTROINTESTINAL ENDOSCOPY    . VARICOSE VEIN SURGERY Bilateral 2007, 2009   left 2007, right 2009  . VITRECTOMY Right 2012    There were no vitals filed for this visit.  Subjective Assessment - 08/16/18 1100    Subjective  Patient reports chronic R shoulder pain for years, starting when she fisrt started using a computer mouse. Recent flare up in the last couple months- notes that around Christmas she did more cleaning and heavy lifting- lifting 40 lb bags of cat litter. Also notes that she was seeing a Physiological scientist in October and competed 10 sessions, but pain has worsened and she discontinued. Pain is diffuse around R shoulder joint. Intermittent N/T and burning as well as radiation of pain down to elbow. Also notes she has TMJ pain but associates it with her R shoulder d/t "pulling" in the upper shoulder. Aggravating factors include computer work, Investment banker, operational on R, vacuuming, working overhead. Better with personal massager machine, heat.    Pertinent History  vasovagal episode, TMJ  dislocation, plantar fasciitis, HLD, GERD, fibromyalgia, chronic back pain, B trigger finger release, R TKA 2018, R mallet finger, R L3-4 laminectomy/decompression 2016, R knee arthroscopy x3, L morton's neuroma, L thumb CMC fusion 2015, B carpal tunnel release 2005    Limitations  Sitting;Reading;Lifting;Writing;House hold activities    Diagnostic tests  none recent    Patient Stated Goals  "that this would not hurt again and get back to my weights"    Currently in Pain?  Yes    Pain Score  4     Pain Location  Shoulder    Pain Orientation  Right    Pain Descriptors / Indicators  Aching;Heaviness;Tiring    Pain Type  Chronic pain         [facility] PT Assessment - 08/16/18 1114      Assessment   Medical Diagnosis  R shoulder pain    Referring Provider (PT)  Karlton Lemon, MD    Onset Date/Surgical Date  --   several years   Hand Dominance  Right    Next MD Visit  09/18/18    Prior Therapy  Yes      Precautions   Precautions  --   hx syncope- but managed     Restrictions   Weight Bearing Restrictions  No      Balance Screen   Has the patient fallen in the past 6 months  Yes    How many times?  1   caught toe on something- hurt elbows and knee   Has the patient had a decrease in activity level because of a fear of falling?   No    Is the patient reluctant to leave their home because of a fear of falling?   No      Home Film/video editor residence    Living Arrangements  Spouse/significant other    Additional Comments  caregiver for husband and mother      Prior Function   Level of Independence  Independent    Vocation  Retired    Leisure  return to gym      Cognition   Overall Cognitive Status  Within Functional Limits for tasks assessed      Observation/Other Assessments   Focus on Therapeutic Outcomes (FOTO)   Shoulder: 50 (50% impaired, 40% predicted)      Sensation   Light Touch  Appears Intact      Coordination   Gross Motor Movements  are Fluid and Coordinated  Yes      Posture/Postural Control   Posture/Postural Control  Postural limitations    Postural Limitations  Rounded Shoulders;Forward head  ROM / Strength   AROM / PROM / Strength  AROM;PROM;Strength      AROM   AROM Assessment Site  Shoulder;Cervical    Right/Left Shoulder  Right;Left    Right Shoulder Flexion  142 Degrees   pain   Right Shoulder ABduction  168 Degrees   pain   Right Shoulder Internal Rotation  --   FIR T9 w/ pain   Right Shoulder External Rotation  --   FER C7 w/ pain   Left Shoulder Flexion  158 Degrees    Left Shoulder ABduction  180 Degrees    Left Shoulder Internal Rotation  --   FIR T6   Left Shoulder External Rotation  --   FER T2   Cervical Flexion  50   pain in R posterior shoulder   Cervical Extension  55   pain in R posterior shoulder   Cervical - Right Side Bend  35   L UT pain   Cervical - Left Side Bend  53   R UT pain   Cervical - Right Rotation  mildly limited   pain in L UT   Cervical - Left Rotation  moderately limited   pain in R UT     Strength   Strength Assessment Site  Shoulder    Right/Left Shoulder  Right;Left    Right Shoulder Flexion  4/5    Right Shoulder ABduction  4-/5   pain   Right Shoulder Internal Rotation  4/5    Right Shoulder External Rotation  4-/5    Left Shoulder Flexion  4/5    Left Shoulder ABduction  4+/5    Left Shoulder Internal Rotation  4+/5    Left Shoulder External Rotation  4/5      Palpation   Palpation comment  TTP and soft tissue restriction in R UT, LS, infraspinatus, scalenes, proximal biceps tendon, pec and L proximal biceps tendon, infraspinatus                Objective measurements completed on examination: See above findings.              PT Education - 08/16/18 1144    Education Details  prognosis, POC, HEP    Person(s) Educated  Patient    Methods  Explanation;Demonstration;Tactile cues;Verbal cues;Handout    Comprehension   Verbalized understanding;Returned demonstration       PT Short Term Goals - 08/16/18 1150      PT SHORT TERM GOAL #1   Title  pt independent with initial HEP by 03/28/15    Time  3    Period  Weeks    Status  New    Target Date  09/06/18        PT Long Term Goals - 08/16/18 1301      PT LONG TERM GOAL #1   Title  Patient will be independent with advanced HEP.    Time  6    Period  Weeks    Status  New    Target Date  09/27/18      PT LONG TERM GOAL #2   Title  Patient to demonstrate R shoulder AROM non-painful and WFL.    Time  6    Period  Weeks    Status  New    Target Date  09/27/18      PT LONG TERM GOAL #3   Title  Patient to demonstrate cervical AROM WFL and without pain limiting.     Time  6    Period  Weeks    Status  New    Target Date  09/27/18      PT LONG TERM GOAL #4   Title  Patient to demonstrate R shoulder strength >=4+/5.    Time  6    Period  Weeks    Status  New    Target Date  09/27/18      PT LONG TERM GOAL #5   Title  Patient to demonstrate and report increased postural awareness throughout the day.     Time  6    Period  Weeks    Status  New    Target Date  09/27/18      Additional Long Term Goals   Additional Long Term Goals  Yes      PT LONG TERM GOAL #6   Title  Patient to return to gym activities without pain limiting.     Time  6    Period  Weeks    Status  New    Target Date  09/27/18             Plan - 08/16/18 1145    Clinical Impression Statement  Patient is a 68y/o F presenting ot OPPT with c/o chronic R shoulder pain. Recent flare up has lasted a couple months, with patient attributing this to increased cleaning and lifting over the holidays. Patient also with report of high stress in her personal life and hx of TMJ pain. Pain is diffuse over R shoulder capsule, with c/o intermittent radiation of pain down to elbow as well as N/T and burning. Worse with computer work, Investment banker, operational on R, vacuuming, working overhead.  Patient today with limited and painful R shoulder and cervical ROM, decreased R shoulder strength, severely rounded shoulder and forward head posturing, and considerable soft tissue restriction and tenderness throughout posterior/anterior shoulder and cervical musculature. Educated patient on gentle stretching and postural correction exercises and administered handout. Patient reported understating. Would benefit from skilled PT services 2x/week for 6 weeks to address aforementioned impairments.     Clinical Presentation  Stable    Clinical Decision Making  Low    Rehab Potential  Good    Clinical Impairments Affecting Rehab Potential  vasovagal episode, TMJ dislocation, plantar fasciitis, HLD, GERD, fibromyalgia, chronic back pain, B trigger finger release, R TKA 2018, R mallet finger, R L3-4 laminectomy/decompression 2016, R knee arthroscopy x3, L morton's neuroma, L thumb CMC fusion 2015, B carpal tunnel release 2005    PT Frequency  2x / week    PT Duration  6 weeks    PT Treatment/Interventions  ADLs/Self Care Home Management;Cryotherapy;Electrical Stimulation;Iontophoresis 4mg /ml Dexamethasone;Functional mobility training;Ultrasound;Moist Heat;Therapeutic activities;Therapeutic exercise;Neuromuscular re-education;Patient/family education;Passive range of motion;Scar mobilization;Manual techniques;Dry needling;Energy conservation;Splinting;Taping;Vasopneumatic Device    PT Next Visit Plan  reassess HEP    Consulted and Agree with Plan of Care  Patient       Patient will benefit from skilled therapeutic intervention in order to improve the following deficits and impairments:  Decreased activity tolerance, Decreased strength, Increased fascial restricitons, Pain, Impaired UE functional use, Increased muscle spasms, Improper body mechanics, Decreased range of motion, Impaired flexibility, Postural dysfunction  Visit Diagnosis: Chronic right shoulder pain  Stiffness of right shoulder, not  elsewhere classified  Cervicalgia  Abnormal posture  Other symptoms and signs involving the musculoskeletal system     Problem List Patient Active Problem List   Diagnosis Date Noted  . History of food allergy 02/01/2018  . OA (  osteoarthritis) of knee 09/06/2016  . Aortic atherosclerosis ([place]) 05/07/2016  . Recurrent urticaria 06/02/2015  . Allergic rhinitis with a probable nonallergic component 06/02/2015  . 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 Lumbar stenosis 02/11/2015  . Abdominal pain, chronic, right lower quadrant 01/07/2015  . Right hip pain 10/31/2014  . Vitamin D deficiency 10/31/2014  . Osteopenia 10/18/2014  . OSA (obstructive sleep apnea) 07/16/2014  . Migraines 07/16/2014  . TMJ (dislocation of temporomandibular joint) 07/16/2014  . HLD (hyperlipidemia) 03/22/2014  . S/P Nissen fundoplication May 1287 86/76/7209  . Dysphagia 01/24/2008  . DEPRESSION/ANXIETY 12/02/2007  . ESOPHAGEAL STRICTURE 12/02/2007  . GERD 12/02/2007  . Irritable bowel syndrome 12/02/2007  . Fibromyalgia 12/02/2007  . VASOVAGAL SYNCOPE 12/02/2007    Janene Harvey, PT, DPT 08/16/18 1:05 PM   [facility] [address]  [place], [place], [address] Phone: [phone]   Fax:  [phone]  Name: ZAKAYLA MARTINEC MRN: [id number] Date of Birth: Jan 17, 1951

## 2018-08-21 DIAGNOSIS — F4310 Post-traumatic stress disorder, unspecified: Secondary | ICD-10-CM | POA: Diagnosis not present

## 2018-08-22 ENCOUNTER — Ambulatory Visit: Payer: Medicare Other | Admitting: Physical Therapy

## 2018-08-22 ENCOUNTER — Encounter: Payer: Self-pay | Admitting: Physical Therapy

## 2018-08-22 DIAGNOSIS — M25611 Stiffness of right shoulder, not elsewhere classified: Secondary | ICD-10-CM

## 2018-08-22 DIAGNOSIS — M542 Cervicalgia: Secondary | ICD-10-CM | POA: Diagnosis not present

## 2018-08-22 DIAGNOSIS — R29898 Other symptoms and signs involving the musculoskeletal system: Secondary | ICD-10-CM

## 2018-08-22 DIAGNOSIS — R293 Abnormal posture: Secondary | ICD-10-CM | POA: Diagnosis not present

## 2018-08-22 DIAGNOSIS — G8929 Other chronic pain: Secondary | ICD-10-CM | POA: Diagnosis not present

## 2018-08-22 DIAGNOSIS — M25511 Pain in right shoulder: Principal | ICD-10-CM

## 2018-08-22 NOTE — Therapy (Signed)
[facility] [address]  [place], [place], [address] Phone: [phone]   Fax:  [phone]  Physical Therapy Treatment  Patient Details  Name: Vanessa Oliver MRN: [id number] Date of Birth: [birth year] Referring Provider (PT): Karlton Lemon, MD   Encounter Date: 08/22/2018  PT End of Session - 08/22/18 1011    Visit Number  2    Number of Visits  13    Date for PT Re-Evaluation  09/27/18    Authorization Type  Medicare & Tricare    PT Start Time  0925    PT Stop Time  1010    PT Time Calculation (min)  45 min    Activity Tolerance  Patient tolerated treatment well    Behavior During Therapy  [facility] for tasks assessed/performed       Past Medical History:  Diagnosis Date  . Allergy    takes Singulair at night  . Anxiety   . Arthritis   . Cataract   . Chronic back pain    stenosis  . Depression    takes CYmbalta daily  . Esophageal stricture   . Family history of adverse reaction to anesthesia    oldest brother had trouble with anesthesia a long time ago but can't recall what  . Fibromyalgia   . Fibromyalgia   . GERD (gastroesophageal reflux disease)    takes Omeprazole daily  . Hemorrhoids   . History of bronchitis   . Hyperlipidemia   . IBS (irritable bowel syndrome)   . Joint pain   . Joint swelling   . Plantar fasciitis   . Rosacea   . Rosacea   . Sleep apnea    cpap- settings at 10 breathing   . TMJ (dislocation of temporomandibular joint)   . TMJ (temporomandibular joint disorder)   . Vasovagal episode back in the 80's  . Vasovagal syncope   . Weakness    right leg    Past Surgical History:  Procedure Laterality Date  . ABDOMINAL HYSTERECTOMY  1997  . CARPAL TUNNEL RELEASE Bilateral 2004  . CARPOMETACARPAL (CMC) FUSION OF THUMB  2015   thumb  . CATARACT EXTRACTION Right 2013   Dr Ellison Hughs  . CATARACT EXTRACTION Left 04/2016  . COLONOSCOPY    . COLONOSCOPY WITH  ESOPHAGOGASTRODUODENOSCOPY (EGD) AND ESOPHAGEAL DILATION (ED)    . EYE SURGERY     scar tissue removed after cataract surgery  . 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 KNEE ARTHROSCOPY Right 1994   x 3  . KNEE SURGERY Right 01/21/2006  . LUMBAR LAMINECTOMY/DECOMPRESSION MICRODISCECTOMY Right 02/11/2015   Procedure: Laminectomy and Foraminotomy - Right - L3-L4;  Surgeon: Earnie Larsson, MD;  Location: [facility];  Service: Neurosurgery;  Laterality: Right;  Laminectomy and Foraminotomy - Right - L3-L4  . mallet finger Right 2011  . NASAL SEPTUM SURGERY  1970  . NISSEN FUNDOPLICATION  6384  . RETINAL LASER PROCEDURE Right 02/01/2011   right eye   . TONSILLECTOMY  1981  . TOTAL KNEE ARTHROPLASTY Right 09/06/2016   Procedure: RIGHT TOTAL KNEE ARTHROPLASTY;  Surgeon: Gaynelle Arabian, MD;  Location: [facility];  Service: Orthopedics;  Laterality: Right;  . TRIGGER FINGER RELEASE Bilateral 2005  . UPPER GASTROINTESTINAL ENDOSCOPY    . VARICOSE VEIN SURGERY Bilateral 2007, 2009   left 2007, right 2009  . VITRECTOMY Right 2012    There were no vitals filed for this visit.  Subjective  Assessment - 08/22/18 0927    Subjective  Reports that she feels better when she does her HEP. Has been compliant but has not done her first set of exercises this AM yet.     Pertinent History  vasovagal episode, TMJ dislocation, plantar fasciitis, HLD, GERD, fibromyalgia, chronic back pain, B trigger finger release, R TKA 2018, R mallet finger, R L3-4 laminectomy/decompression 2016, R knee arthroscopy x3, L morton's neuroma, L thumb CMC fusion 2015, B carpal tunnel release 2005    Diagnostic tests  none recent    Patient Stated Goals  "that this would not hurt again and get back to my weights"    Currently in Pain?  Yes    Pain Score  4     Pain Location  Shoulder    Pain Orientation  Right    Pain Descriptors / Indicators  Sore    Pain Type  Chronic pain                       [facility] Adult  PT Treatment/Exercise - 08/22/18 0001      Exercises   Exercises  Shoulder;Neck      Neck Exercises: Seated   Neck Retraction  10 reps;3 secs    Neck Retraction Limitations  2x10; 2nd set with yellow TB   good form     Shoulder Exercises: Seated   Retraction  Strengthening;Both;10 reps    Retraction Limitations  10x3" with cues to keep hands down in lap    Flexion  AAROM;Right;10 reps    Flexion Limitations  flexion rollouts with orange pball to tolerance 10x3"    Abduction  AAROM;Right;10 reps    ABduction Limitations  abduction rollouts with orange pball to tolerance 10x3"      Shoulder Exercises: Standing   Extension  Strengthening;Both;10 reps;Theraband    Theraband Level (Shoulder Extension)  Level 2 (Red)    Extension Limitations  better carryover compared to row    Row  Strengthening;Both;10 reps;Theraband    Theraband Level (Shoulder Row)  Level 2 (Red)    Row Limitations  heavy cues to avoid excessive tension and hiking in B shoulders      Shoulder Exercises: ROM/Strengthening   UBE (Upper Arm Bike)  L1 x 3 min forward/3 min back      Manual Therapy   Manual Therapy  Soft tissue mobilization;Myofascial release    Soft tissue mobilization  STM to R UT, LS, cervical paraspinals, suboccipitals- tension in UT and LS    Myofascial Release  manual TPR to R LS and UT      Neck Exercises: Stretches   Upper Trapezius Stretch  Right;2 reps;30 seconds    Upper Trapezius Stretch Limitations  with strap to tolerance    Levator Stretch  Right;2 reps;30 seconds    Levator Stretch Limitations  with strap to tolerance             PT Education - 08/22/18 1010    Education Details  update to HEP; adminsitered red and yellow TB    Person(s) Educated  Patient    Methods  Explanation;Demonstration;Tactile cues;Verbal cues;Handout    Comprehension  Verbalized understanding;Returned demonstration       PT Short Term Goals - 08/22/18 1013      PT SHORT TERM GOAL #1   Title   pt independent with initial HEP by 03/28/15    Time  3    Period  Weeks    Status  Achieved  Target Date  09/06/18        PT Long Term Goals - 08/22/18 1013      PT LONG TERM GOAL #1   Title  Patient will be independent with advanced HEP.    Time  6    Period  Weeks    Status  On-going      PT LONG TERM GOAL #2   Title  Patient to demonstrate R shoulder AROM non-painful and WFL.    Time  6    Period  Weeks    Status  On-going      PT LONG TERM GOAL #3   Title  Patient to demonstrate cervical AROM WFL and without pain limiting.     Time  6    Period  Weeks    Status  On-going      PT LONG TERM GOAL #4   Title  Patient to demonstrate R shoulder strength >=4+/5.    Time  6    Period  Weeks    Status  On-going      PT LONG TERM GOAL #5   Title  Patient to demonstrate and report increased postural awareness throughout the day.     Time  6    Period  Weeks    Status  On-going      PT LONG TERM GOAL #6   Title  Patient to return to gym activities without pain limiting.     Time  6    Period  Weeks    Status  On-going            Plan - 08/22/18 1011    Clinical Impression Statement  Patient arrived to session with report of compliance with and benefit from HEP. Today woke up with some soreness in R cervical spine. Tolerated STM and TPR to R UT, LS, cervical paraspinals, suboccipitals; patient with increased tension in UT and LS today. Followed manual therapy with UT and LS stretching to tolerance. Able to progress cervical retractions with light banded resistance as patient with good form and carryover today. Required cues to avoid over-retraction/hiking and muscle tension with scapular rows, better carryover of cues and form with shoulder extension. Updated HEP with exercises that were well-tolerated today. Patient reported understanding and with report of "I am feeling good" at end of session.     Clinical Impairments Affecting Rehab Potential  vasovagal episode,  TMJ dislocation, plantar fasciitis, HLD, GERD, fibromyalgia, chronic back pain, B trigger finger release, R TKA 2018, R mallet finger, R L3-4 laminectomy/decompression 2016, R knee arthroscopy x3, L morton's neuroma, L thumb CMC fusion 2015, B carpal tunnel release 2005    PT Treatment/Interventions  ADLs/Self Care Home Management;Cryotherapy;Electrical Stimulation;Iontophoresis 4mg /ml Dexamethasone;Functional mobility training;Ultrasound;Moist Heat;Therapeutic activities;Therapeutic exercise;Neuromuscular re-education;Patient/family education;Passive range of motion;Scar mobilization;Manual techniques;Dry needling;Energy conservation;Splinting;Taping;Vasopneumatic Device    PT Next Visit Plan  progress neck and shoulder ROM, periscapular strengthening     Consulted and Agree with Plan of Care  Patient       Patient will benefit from skilled therapeutic intervention in order to improve the following deficits and impairments:  Decreased activity tolerance, Decreased strength, Increased fascial restricitons, Pain, Impaired UE functional use, Increased muscle spasms, Improper body mechanics, Decreased range of motion, Impaired flexibility, Postural dysfunction  Visit Diagnosis: Chronic right shoulder pain  Stiffness of right shoulder, not elsewhere classified  Cervicalgia  Abnormal posture  Other symptoms and signs involving the musculoskeletal system     Problem List Patient Active Problem List  Diagnosis Date Noted  . History of food allergy 02/01/2018  . OA (osteoarthritis) of knee 09/06/2016  . Aortic atherosclerosis ([place]) 05/07/2016  . Recurrent urticaria 06/02/2015  . Allergic rhinitis with a probable nonallergic component 06/02/2015  . 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 Lumbar stenosis 02/11/2015  . Abdominal pain, chronic, right lower quadrant 01/07/2015  . Right hip pain 10/31/2014  . Vitamin D deficiency 10/31/2014  . Osteopenia  10/18/2014  . OSA (obstructive sleep apnea) 07/16/2014  . Migraines 07/16/2014  . TMJ (dislocation of temporomandibular joint) 07/16/2014  . HLD (hyperlipidemia) 03/22/2014  . S/P Nissen fundoplication May 1833 58/25/1898  . Dysphagia 01/24/2008  . DEPRESSION/ANXIETY 12/02/2007  . ESOPHAGEAL STRICTURE 12/02/2007  . GERD 12/02/2007  . Irritable bowel syndrome 12/02/2007  . Fibromyalgia 12/02/2007  . VASOVAGAL SYNCOPE 12/02/2007    Janene Harvey, PT, DPT 08/22/18 10:14 AM   [facility] [address]  [place], [place], [address] Phone: [phone]   Fax:  [phone]  Name: KAYLISE BLAKELEY MRN: [id number] Date of Birth: 11-20-1950

## 2018-08-24 ENCOUNTER — Encounter: Payer: Self-pay | Admitting: Physical Therapy

## 2018-08-24 ENCOUNTER — Ambulatory Visit: Payer: Medicare Other | Admitting: Physical Therapy

## 2018-08-24 DIAGNOSIS — M542 Cervicalgia: Secondary | ICD-10-CM | POA: Diagnosis not present

## 2018-08-24 DIAGNOSIS — M25611 Stiffness of right shoulder, not elsewhere classified: Secondary | ICD-10-CM

## 2018-08-24 DIAGNOSIS — G8929 Other chronic pain: Secondary | ICD-10-CM | POA: Diagnosis not present

## 2018-08-24 DIAGNOSIS — R29898 Other symptoms and signs involving the musculoskeletal system: Secondary | ICD-10-CM | POA: Diagnosis not present

## 2018-08-24 DIAGNOSIS — M25511 Pain in right shoulder: Principal | ICD-10-CM

## 2018-08-24 DIAGNOSIS — R293 Abnormal posture: Secondary | ICD-10-CM | POA: Diagnosis not present

## 2018-08-24 NOTE — Therapy (Signed)
[facility] [address]  [place], [place], [address] Phone: [phone]   Fax:  [phone]  Physical Therapy Treatment  Patient Details  Name: Vanessa Oliver MRN: [id number] Date of Birth: 02-09-1951 Referring Provider (PT): Karlton Lemon, MD   Encounter Date: 08/24/2018  PT End of Session - 08/24/18 1532    Visit Number  3    Number of Visits  13    Date for PT Re-Evaluation  09/27/18    Authorization Type  Medicare & Tricare    PT Start Time  1446    PT Stop Time  1543    PT Time Calculation (min)  57 min    Activity Tolerance  Patient tolerated treatment well;Patient limited by pain    Behavior During Therapy  [facility] for tasks assessed/performed       Past Medical History:  Diagnosis Date  . Allergy    takes Singulair at night  . Anxiety   . Arthritis   . Cataract   . Chronic back pain    stenosis  . Depression    takes CYmbalta daily  . Esophageal stricture   . Family history of adverse reaction to anesthesia    oldest brother had trouble with anesthesia a long time ago but can't recall what  . Fibromyalgia   . Fibromyalgia   . GERD (gastroesophageal reflux disease)    takes Omeprazole daily  . Hemorrhoids   . History of bronchitis   . Hyperlipidemia   . IBS (irritable bowel syndrome)   . Joint pain   . Joint swelling   . Plantar fasciitis   . Rosacea   . Rosacea   . Sleep apnea    cpap- settings at 10 breathing   . TMJ (dislocation of temporomandibular joint)   . TMJ (temporomandibular joint disorder)   . Vasovagal episode back in the 80's  . Vasovagal syncope   . Weakness    right leg    Past Surgical History:  Procedure Laterality Date  . ABDOMINAL HYSTERECTOMY  1997  . CARPAL TUNNEL RELEASE Bilateral 2004  . CARPOMETACARPAL (CMC) FUSION OF THUMB  2015   thumb  . CATARACT EXTRACTION Right 2013   Dr Ellison Hughs  . CATARACT EXTRACTION Left 04/2016  . COLONOSCOPY    . COLONOSCOPY  WITH ESOPHAGOGASTRODUODENOSCOPY (EGD) AND ESOPHAGEAL DILATION (ED)    . EYE SURGERY     scar tissue removed after cataract surgery  . 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 KNEE ARTHROSCOPY Right 1994   x 3  . KNEE SURGERY Right 01/21/2006  . LUMBAR LAMINECTOMY/DECOMPRESSION MICRODISCECTOMY Right 02/11/2015   Procedure: Laminectomy and Foraminotomy - Right - L3-L4;  Surgeon: Earnie Larsson, MD;  Location: [facility];  Service: Neurosurgery;  Laterality: Right;  Laminectomy and Foraminotomy - Right - L3-L4  . mallet finger Right 2011  . NASAL SEPTUM SURGERY  1970  . NISSEN FUNDOPLICATION  2536  . RETINAL LASER PROCEDURE Right 02/01/2011   right eye   . TONSILLECTOMY  1981  . TOTAL KNEE ARTHROPLASTY Right 09/06/2016   Procedure: RIGHT TOTAL KNEE ARTHROPLASTY;  Surgeon: Gaynelle Arabian, MD;  Location: [facility];  Service: Orthopedics;  Laterality: Right;  . TRIGGER FINGER RELEASE Bilateral 2005  . UPPER GASTROINTESTINAL ENDOSCOPY    . VARICOSE VEIN SURGERY Bilateral 2007, 2009   left 2007, right 2009  . VITRECTOMY Right 2012    There were no vitals filed for this  visit.  Subjective Assessment - 08/24/18 1447    Subjective  Reports that she woke up with a tension HA and has taken medicine and used massage machine but still having pain. Notes it is because of stress in her house.     Pertinent History  vasovagal episode, TMJ dislocation, plantar fasciitis, HLD, GERD, fibromyalgia, chronic back pain, B trigger finger release, R TKA 2018, R mallet finger, R L3-4 laminectomy/decompression 2016, R knee arthroscopy x3, L morton's neuroma, L thumb CMC fusion 2015, B carpal tunnel release 2005    Diagnostic tests  none recent    Patient Stated Goals  "that this would not hurt again and get back to my weights"    Currently in Pain?  Yes    Pain Score  7     Pain Location  Shoulder    Pain Orientation  Right    Pain Descriptors / Indicators  Sore    Pain Type  Chronic pain    Multiple Pain  Sites  Yes    Pain Score  7    Pain Location  Head    Pain Orientation  Posterior    Pain Descriptors / Indicators  Aching    Pain Type  Acute pain    Pain Radiating Towards  radiating to R eye                       [facility] Adult PT Treatment/Exercise - 08/24/18 0001      Shoulder Exercises: Seated   Flexion  AAROM;Right;10 reps    Flexion Limitations  flexion rollouts with orange pball to tolerance 10x3"    Abduction  AAROM;Right;10 reps    ABduction Limitations  abduction rollouts with orange pball to tolerance 10x3"    Other Seated Exercises  cervical retraction + scap retraction 10x3"   cues to avoid shoulde hiking     Shoulder Exercises: ROM/Strengthening   UBE (Upper Arm Bike)  L2 x 3 min forward/3 min back      Shoulder Exercises: Stretch   Corner Stretch  2 reps;30 seconds    Corner Stretch Limitations  R shoulder pec stretch at doorway to tolerance      Modalities   Modalities  Electrical Stimulation;Moist Heat      Moist Heat Therapy   Number Minutes Moist Heat  15 Minutes    Moist Heat Location  Cervical      Electrical Stimulation   Electrical Stimulation Location  R UT    Electrical Stimulation Action  IFC    Electrical Stimulation Parameters  80-150hz ; output 12 to tolerance; 15 min    Electrical Stimulation Goals  Pain      Manual Therapy   Manual Therapy  Soft tissue mobilization;Myofascial release    Manual therapy comments  sitting    Soft tissue mobilization  STM and IASTM to R UT, LS, cervical paraspinals, suboccipitals, scalenes- increased tension throughout    Myofascial Release  manual TPR to R LS, UT, and suboccipitals      Neck Exercises: Stretches   Upper Trapezius Stretch  Right;2 reps;30 seconds    Upper Trapezius Stretch Limitations  with strap to tolerance    Levator Stretch  Right;2 reps;30 seconds    Levator Stretch Limitations  with strap to tolerance    Other Neck Stretches  R scalene stretch w/ strap to tolerance 2x30"              PT Education - 08/24/18 1532  Education Details  update to HEP; edu on personal TENS use for pain relief     Person(s) Educated  Patient    Methods  Explanation;Demonstration;Tactile cues;Verbal cues;Handout    Comprehension  Verbalized understanding;Returned demonstration       PT Short Term Goals - 08/22/18 1013      PT SHORT TERM GOAL #1   Title  pt independent with initial HEP by 03/28/15    Time  3    Period  Weeks    Status  Achieved    Target Date  09/06/18        PT Long Term Goals - 08/22/18 1013      PT LONG TERM GOAL #1   Title  Patient will be independent with advanced HEP.    Time  6    Period  Weeks    Status  On-going      PT LONG TERM GOAL #2   Title  Patient to demonstrate R shoulder AROM non-painful and WFL.    Time  6    Period  Weeks    Status  On-going      PT LONG TERM GOAL #3   Title  Patient to demonstrate cervical AROM WFL and without pain limiting.     Time  6    Period  Weeks    Status  On-going      PT LONG TERM GOAL #4   Title  Patient to demonstrate R shoulder strength >=4+/5.    Time  6    Period  Weeks    Status  On-going      PT LONG TERM GOAL #5   Title  Patient to demonstrate and report increased postural awareness throughout the day.     Time  6    Period  Weeks    Status  On-going      PT LONG TERM GOAL #6   Title  Patient to return to gym activities without pain limiting.     Time  6    Period  Weeks    Status  On-going            Plan - 08/24/18 1533    Clinical Impression Statement  Patient today with report of waking up with severe tension HA as well as neck and shoulder pain. Focused today's session on pain relief. Tolerated STM, IASTM, and manual TPR to R UT, LS, cervical paraspinals, suboccipitals, scalenes- all with increased tension throughout. Patient reported total relief of HA after manual therapy. Introduced scalene stretch to tolerance with good benefit. Patient with question  about pec stretch and if it could be contributing to her knee pain. Had patient demonstrate this stretch but with good form. Advised patient that knee pain not likely from performing this stretch. Updated HEP with exercises well tolerated this session. Patient reported understanding, Ended session with e-stim and moist heat to cervical spine for pain relief. Normal integumentary response and report of feeling "much better" at end of session.     Clinical Impairments Affecting Rehab Potential  vasovagal episode, TMJ dislocation, plantar fasciitis, HLD, GERD, fibromyalgia, chronic back pain, B trigger finger release, R TKA 2018, R mallet finger, R L3-4 laminectomy/decompression 2016, R knee arthroscopy x3, L morton's neuroma, L thumb CMC fusion 2015, B carpal tunnel release 2005    PT Treatment/Interventions  ADLs/Self Care Home Management;Cryotherapy;Electrical Stimulation;Iontophoresis 4mg /ml Dexamethasone;Functional mobility training;Ultrasound;Moist Heat;Therapeutic activities;Therapeutic exercise;Neuromuscular re-education;Patient/family education;Passive range of motion;Scar mobilization;Manual techniques;Dry needling;Energy conservation;Splinting;Taping;Vasopneumatic Device    PT  Next Visit Plan  progress neck and shoulder ROM, periscapular strengthening     Consulted and Agree with Plan of Care  Patient       Patient will benefit from skilled therapeutic intervention in order to improve the following deficits and impairments:  Decreased activity tolerance, Decreased strength, Increased fascial restricitons, Pain, Impaired UE functional use, Increased muscle spasms, Improper body mechanics, Decreased range of motion, Impaired flexibility, Postural dysfunction  Visit Diagnosis: Chronic right shoulder pain  Stiffness of right shoulder, not elsewhere classified  Cervicalgia  Abnormal posture  Other symptoms and signs involving the musculoskeletal system     Problem List Patient Active  Problem List   Diagnosis Date Noted  . History of food allergy 02/01/2018  . OA (osteoarthritis) of knee 09/06/2016  . Aortic atherosclerosis ([place]) 05/07/2016  . Recurrent urticaria 06/02/2015  . Allergic rhinitis with a probable nonallergic component 06/02/2015  . 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 Lumbar stenosis 02/11/2015  . Abdominal pain, chronic, right lower quadrant 01/07/2015  . Right hip pain 10/31/2014  . Vitamin D deficiency 10/31/2014  . Osteopenia 10/18/2014  . OSA (obstructive sleep apnea) 07/16/2014  . Migraines 07/16/2014  . TMJ (dislocation of temporomandibular joint) 07/16/2014  . HLD (hyperlipidemia) 03/22/2014  . S/P Nissen fundoplication May 1115 52/02/222  . Dysphagia 01/24/2008  . DEPRESSION/ANXIETY 12/02/2007  . ESOPHAGEAL STRICTURE 12/02/2007  . GERD 12/02/2007  . Irritable bowel syndrome 12/02/2007  . Fibromyalgia 12/02/2007  . VASOVAGAL SYNCOPE 12/02/2007    Janene Harvey, PT, DPT 08/24/18 3:49 PM   [facility] [address]  [place], [place], [address] Phone: [phone]   Fax:  [phone]  Name: Vanessa Oliver MRN: [id number] Date of Birth: Jun 29, 1951

## 2018-08-27 ENCOUNTER — Encounter: Payer: Self-pay | Admitting: Family

## 2018-08-28 MED ORDER — AZELAIC ACID 15 % EX GEL: CUTANEOUS | 1 refills | Status: DC

## 2018-08-29 ENCOUNTER — Encounter: Payer: Self-pay | Admitting: Physical Therapy

## 2018-08-29 ENCOUNTER — Ambulatory Visit: Payer: Medicare Other | Admitting: Physical Therapy

## 2018-08-29 DIAGNOSIS — G8929 Other chronic pain: Secondary | ICD-10-CM

## 2018-08-29 DIAGNOSIS — R29898 Other symptoms and signs involving the musculoskeletal system: Secondary | ICD-10-CM | POA: Diagnosis not present

## 2018-08-29 DIAGNOSIS — M25611 Stiffness of right shoulder, not elsewhere classified: Secondary | ICD-10-CM | POA: Diagnosis not present

## 2018-08-29 DIAGNOSIS — M25511 Pain in right shoulder: Secondary | ICD-10-CM | POA: Diagnosis not present

## 2018-08-29 DIAGNOSIS — R293 Abnormal posture: Secondary | ICD-10-CM | POA: Diagnosis not present

## 2018-08-29 DIAGNOSIS — M542 Cervicalgia: Secondary | ICD-10-CM

## 2018-08-29 NOTE — Therapy (Signed)
[facility] [address]  [place], [place], [address] Phone: [phone]   Fax:  [phone]  Physical Therapy Treatment  Patient Details  Name: Vanessa Oliver MRN: [id number] Date of Birth: 11/07/1950 Referring Provider (PT): Karlton Lemon, MD   Encounter Date: 08/29/2018  PT End of Session - 08/29/18 1014    Visit Number  4    Number of Visits  13    Date for PT Re-Evaluation  09/27/18    Authorization Type  Medicare & Tricare    PT Start Time  0929    PT Stop Time  1013    PT Time Calculation (min)  44 min    Activity Tolerance  Patient tolerated treatment well    Behavior During Therapy  [facility] for tasks assessed/performed       Past Medical History:  Diagnosis Date  . Allergy    takes Singulair at night  . Anxiety   . Arthritis   . Cataract   . Chronic back pain    stenosis  . Depression    takes CYmbalta daily  . Esophageal stricture   . Family history of adverse reaction to anesthesia    oldest brother had trouble with anesthesia a long time ago but can't recall what  . Fibromyalgia   . Fibromyalgia   . GERD (gastroesophageal reflux disease)    takes Omeprazole daily  . Hemorrhoids   . History of bronchitis   . Hyperlipidemia   . IBS (irritable bowel syndrome)   . Joint pain   . Joint swelling   . Plantar fasciitis   . Rosacea   . Rosacea   . Sleep apnea    cpap- settings at 10 breathing   . TMJ (dislocation of temporomandibular joint)   . TMJ (temporomandibular joint disorder)   . Vasovagal episode back in the 80's  . Vasovagal syncope   . Weakness    right leg    Past Surgical History:  Procedure Laterality Date  . ABDOMINAL HYSTERECTOMY  1997  . CARPAL TUNNEL RELEASE Bilateral 2004  . CARPOMETACARPAL (CMC) FUSION OF THUMB  2015   thumb  . CATARACT EXTRACTION Right 2013   Dr Ellison Hughs  . CATARACT EXTRACTION Left 04/2016  . COLONOSCOPY    . COLONOSCOPY WITH  ESOPHAGOGASTRODUODENOSCOPY (EGD) AND ESOPHAGEAL DILATION (ED)    . EYE SURGERY     scar tissue removed after cataract surgery  . 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 KNEE ARTHROSCOPY Right 1994   x 3  . KNEE SURGERY Right 01/21/2006  . LUMBAR LAMINECTOMY/DECOMPRESSION MICRODISCECTOMY Right 02/11/2015   Procedure: Laminectomy and Foraminotomy - Right - L3-L4;  Surgeon: Earnie Larsson, MD;  Location: [facility];  Service: Neurosurgery;  Laterality: Right;  Laminectomy and Foraminotomy - Right - L3-L4  . mallet finger Right 2011  . NASAL SEPTUM SURGERY  1970  . NISSEN FUNDOPLICATION  7672  . RETINAL LASER PROCEDURE Right 02/01/2011   right eye   . TONSILLECTOMY  1981  . TOTAL KNEE ARTHROPLASTY Right 09/06/2016   Procedure: RIGHT TOTAL KNEE ARTHROPLASTY;  Surgeon: Gaynelle Arabian, MD;  Location: [facility];  Service: Orthopedics;  Laterality: Right;  . TRIGGER FINGER RELEASE Bilateral 2005  . UPPER GASTROINTESTINAL ENDOSCOPY    . VARICOSE VEIN SURGERY Bilateral 2007, 2009   left 2007, right 2009  . VITRECTOMY Right 2012    There were no vitals filed for this visit.  Subjective  Assessment - 08/29/18 0930    Subjective  Feels that she is getting better but still feeling like she is on the verge of a HA. Notes that it goes into her jaw- only on R side which is the side she has problems with her TMJ. Believes that her mother living with her is causing her stress.     Pertinent History  vasovagal episode, TMJ dislocation, plantar fasciitis, HLD, GERD, fibromyalgia, chronic back pain, B trigger finger release, R TKA 2018, R mallet finger, R L3-4 laminectomy/decompression 2016, R knee arthroscopy x3, L morton's neuroma, L thumb CMC fusion 2015, B carpal tunnel release 2005    Diagnostic tests  none recent    Patient Stated Goals  "that this would not hurt again and get back to my weights"    Currently in Pain?  Yes    Pain Score  6     Pain Location  Neck    Pain Orientation  Right    Pain  Descriptors / Indicators  Sore    Pain Type  Chronic pain    Pain Radiating Towards  radiating to R eye area                       [facility] Adult PT Treatment/Exercise - 08/29/18 0001      Neck Exercises: Seated   Neck Retraction  15 reps;3 secs      Neck Exercises: Prone   Neck Retraction  10 reps    Neck Retraction Limitations  prone on elbows scap retraction + cervical retraction with 3" hold   cues for proper form   Other Prone Exercise  prone cervical retraction + B shoulder extension x10   cues for chin tuck     Shoulder Exercises: Prone   Retraction  Strengthening;Right;10 reps;Weights    Retraction Weight (lbs)  3    Retraction Limitations  prone R UE row; towel roll under forehead   cues for scapular retraction and avoiding hiking   Extension  Strengthening;Right;10 reps;Weights    Extension Weight (lbs)  2    Extension Limitations  towel roll under forehead      Shoulder Exercises: ROM/Strengthening   UBE (Upper Arm Bike)  L2 x 3 min forward/3 min back      Manual Therapy   Manual Therapy  Soft tissue mobilization;Myofascial release;Passive ROM    Manual therapy comments  sitting    Soft tissue mobilization  STM and IASTM to R UT, LS, cervical paraspinals, suboccipitals, scalenes- increased tension in LS and distal UT insertion   mildly improved muscle tension from previous sessions   Myofascial Release  manual TPR to R LS, UT, and suboccipitals    Passive ROM  R suboccipital stretch 3x30" to tolerance      Neck Exercises: Stretches   Levator Stretch  Right;2 reps;30 seconds    Levator Stretch Limitations  with strap to tolerance             PT Education - 08/29/18 1012    Education Details  discussion on personal TENS use for pain relief, use of gentle cervical traction for pain relief    Person(s) Educated  Patient    Methods  Explanation    Comprehension  Verbalized understanding       PT Short Term Goals - 08/22/18 1013      PT  SHORT TERM GOAL #1   Title  pt independent with initial HEP by 03/28/15    Time  3    Period  Weeks    Status  Achieved    Target Date  09/06/18        PT Long Term Goals - 08/22/18 1013      PT LONG TERM GOAL #1   Title  Patient will be independent with advanced HEP.    Time  6    Period  Weeks    Status  On-going      PT LONG TERM GOAL #2   Title  Patient to demonstrate R shoulder AROM non-painful and WFL.    Time  6    Period  Weeks    Status  On-going      PT LONG TERM GOAL #3   Title  Patient to demonstrate cervical AROM WFL and without pain limiting.     Time  6    Period  Weeks    Status  On-going      PT LONG TERM GOAL #4   Title  Patient to demonstrate R shoulder strength >=4+/5.    Time  6    Period  Weeks    Status  On-going      PT LONG TERM GOAL #5   Title  Patient to demonstrate and report increased postural awareness throughout the day.     Time  6    Period  Weeks    Status  On-going      PT LONG TERM GOAL #6   Title  Patient to return to gym activities without pain limiting.     Time  6    Period  Weeks    Status  On-going            Plan - 08/29/18 1014    Clinical Impression Statement  Patient arrived to session with report of improvement in pain, but persisting feeling of "on the verge of a HA." Does note that she believes this pain is caused by stress from her mothing living with her. Patient tolerated STM and IASTM to R UT, LS, cervical paraspinals, suboccipitals, scalenes- increased tension in LS and distal UT insertion demonstrated today, however muscle tension slightly improved from previous sessions. Patient reported relief of HA radiating into R eye after manual therapy. Introduced prone cervical retractions with cues for increased capital flexion. Also introduced prone rows and extension on R UE with cues to avoid shoulder hiking and promote scapular retraction. Provided patient information on personal TENS unit for pain relief and  educated patient on her cervical traction unit that she brought in for pain relief. Patient reported understanding and reported relief of pain at end of session.     Clinical Impairments Affecting Rehab Potential  vasovagal episode, TMJ dislocation, plantar fasciitis, HLD, GERD, fibromyalgia, chronic back pain, B trigger finger release, R TKA 2018, R mallet finger, R L3-4 laminectomy/decompression 2016, R knee arthroscopy x3, L morton's neuroma, L thumb CMC fusion 2015, B carpal tunnel release 2005    PT Treatment/Interventions  ADLs/Self Care Home Management;Cryotherapy;Electrical Stimulation;Iontophoresis 4mg /ml Dexamethasone;Functional mobility training;Ultrasound;Moist Heat;Therapeutic activities;Therapeutic exercise;Neuromuscular re-education;Patient/family education;Passive range of motion;Scar mobilization;Manual techniques;Dry needling;Energy conservation;Splinting;Taping;Vasopneumatic Device    PT Next Visit Plan  progress neck and shoulder ROM, periscapular strengthening     Consulted and Agree with Plan of Care  Patient       Patient will benefit from skilled therapeutic intervention in order to improve the following deficits and impairments:  Decreased activity tolerance, Decreased strength, Increased fascial restricitons, Pain, Impaired UE functional use, Increased muscle spasms, Improper  body mechanics, Decreased range of motion, Impaired flexibility, Postural dysfunction  Visit Diagnosis: Chronic right shoulder pain  Stiffness of right shoulder, not elsewhere classified  Cervicalgia  Abnormal posture  Other symptoms and signs involving the musculoskeletal system     Problem List Patient Active Problem List   Diagnosis Date Noted  . History of food allergy 02/01/2018  . OA (osteoarthritis) of knee 09/06/2016  . Aortic atherosclerosis ([place]) 05/07/2016  . Recurrent urticaria 06/02/2015  . Allergic rhinitis with a probable nonallergic component 06/02/2015  . Spinal  stenosis, lumbar region, with neurogenic claudication 02/11/2015  . Lumbar stenosis 02/11/2015  . Abdominal pain, chronic, right lower quadrant 01/07/2015  . Right hip pain 10/31/2014  . Vitamin D deficiency 10/31/2014  . Osteopenia 10/18/2014  . OSA (obstructive sleep apnea) 07/16/2014  . Migraines 07/16/2014  . TMJ (dislocation of temporomandibular joint) 07/16/2014  . HLD (hyperlipidemia) 03/22/2014  . S/P Nissen fundoplication May 1561 53/79/4327  . Dysphagia 01/24/2008  . DEPRESSION/ANXIETY 12/02/2007  . ESOPHAGEAL STRICTURE 12/02/2007  . GERD 12/02/2007  . Irritable bowel syndrome 12/02/2007  . Fibromyalgia 12/02/2007  . VASOVAGAL SYNCOPE 12/02/2007     Janene Harvey, PT, DPT 08/29/18 10:21 AM   [facility] [address]  [place], [place], [address] Phone: [phone]   Fax:  [phone]  Name: Vanessa Oliver MRN: [id number] Date of Birth: 1950-12-09

## 2018-08-29 NOTE — Patient Instructions (Signed)

## 2018-08-31 ENCOUNTER — Encounter: Payer: Self-pay | Admitting: Physical Therapy

## 2018-08-31 ENCOUNTER — Ambulatory Visit: Payer: Medicare Other | Admitting: Physical Therapy

## 2018-08-31 DIAGNOSIS — R293 Abnormal posture: Secondary | ICD-10-CM | POA: Diagnosis not present

## 2018-08-31 DIAGNOSIS — R29898 Other symptoms and signs involving the musculoskeletal system: Secondary | ICD-10-CM | POA: Diagnosis not present

## 2018-08-31 DIAGNOSIS — M25611 Stiffness of right shoulder, not elsewhere classified: Secondary | ICD-10-CM

## 2018-08-31 DIAGNOSIS — G8929 Other chronic pain: Secondary | ICD-10-CM

## 2018-08-31 DIAGNOSIS — M542 Cervicalgia: Secondary | ICD-10-CM

## 2018-08-31 DIAGNOSIS — M25511 Pain in right shoulder: Secondary | ICD-10-CM | POA: Diagnosis not present

## 2018-08-31 NOTE — Therapy (Signed)
[facility] [address]  [place], [place], [address] Phone: [phone]   Fax:  [phone]  Physical Therapy Treatment  Patient Details  Name: Vanessa Oliver MRN: [id number] Date of Birth: July 26, 1950 Referring Provider (PT): Karlton Lemon, MD   Encounter Date: 08/31/2018  PT End of Session - 08/31/18 1355    Visit Number  5    Number of Visits  13    Date for PT Re-Evaluation  09/27/18    Authorization Type  Medicare & Tricare    PT Start Time  1311    PT Stop Time  1354    PT Time Calculation (min)  43 min    Activity Tolerance  Patient tolerated treatment well    Behavior During Therapy  [facility] for tasks assessed/performed       Past Medical History:  Diagnosis Date  . Allergy    takes Singulair at night  . Anxiety   . Arthritis   . Cataract   . Chronic back pain    stenosis  . Depression    takes CYmbalta daily  . Esophageal stricture   . Family history of adverse reaction to anesthesia    oldest brother had trouble with anesthesia a long time ago but can't recall what  . Fibromyalgia   . Fibromyalgia   . GERD (gastroesophageal reflux disease)    takes Omeprazole daily  . Hemorrhoids   . History of bronchitis   . Hyperlipidemia   . IBS (irritable bowel syndrome)   . Joint pain   . Joint swelling   . Plantar fasciitis   . Rosacea   . Rosacea   . Sleep apnea    cpap- settings at 10 breathing   . TMJ (dislocation of temporomandibular joint)   . TMJ (temporomandibular joint disorder)   . Vasovagal episode back in the 80's  . Vasovagal syncope   . Weakness    right leg    Past Surgical History:  Procedure Laterality Date  . ABDOMINAL HYSTERECTOMY  1997  . CARPAL TUNNEL RELEASE Bilateral 2004  . CARPOMETACARPAL (CMC) FUSION OF THUMB  2015   thumb  . CATARACT EXTRACTION Right 2013   Dr Ellison Hughs  . CATARACT EXTRACTION Left 04/2016  . COLONOSCOPY    . COLONOSCOPY WITH  ESOPHAGOGASTRODUODENOSCOPY (EGD) AND ESOPHAGEAL DILATION (ED)    . EYE SURGERY     scar tissue removed after cataract surgery  . 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 KNEE ARTHROSCOPY Right 1994   x 3  . KNEE SURGERY Right 01/21/2006  . LUMBAR LAMINECTOMY/DECOMPRESSION MICRODISCECTOMY Right 02/11/2015   Procedure: Laminectomy and Foraminotomy - Right - L3-L4;  Surgeon: Earnie Larsson, MD;  Location: [facility];  Service: Neurosurgery;  Laterality: Right;  Laminectomy and Foraminotomy - Right - L3-L4  . mallet finger Right 2011  . NASAL SEPTUM SURGERY  1970  . NISSEN FUNDOPLICATION  7989  . RETINAL LASER PROCEDURE Right 02/01/2011   right eye   . TONSILLECTOMY  1981  . TOTAL KNEE ARTHROPLASTY Right 09/06/2016   Procedure: RIGHT TOTAL KNEE ARTHROPLASTY;  Surgeon: Gaynelle Arabian, MD;  Location: [facility];  Service: Orthopedics;  Laterality: Right;  . TRIGGER FINGER RELEASE Bilateral 2005  . UPPER GASTROINTESTINAL ENDOSCOPY    . VARICOSE VEIN SURGERY Bilateral 2007, 2009   left 2007, right 2009  . VITRECTOMY Right 2012    There were no vitals filed for this visit.  Subjective  Assessment - 08/31/18 1313    Subjective  Reports that she took pain meds and muscle relaxers, heat, and cervical traction which relieved her pain and was able to perform work around the house. Noticed some pain after this, but this was relieved with HEP.     Pertinent History  vasovagal episode, TMJ dislocation, plantar fasciitis, HLD, GERD, fibromyalgia, chronic back pain, B trigger finger release, R TKA 2018, R mallet finger, R L3-4 laminectomy/decompression 2016, R knee arthroscopy x3, L morton's neuroma, L thumb CMC fusion 2015, B carpal tunnel release 2005    Diagnostic tests  none recent    Patient Stated Goals  "that this would not hurt again and get back to my weights"    Currently in Pain?  Yes    Pain Score  4     Pain Location  Neck    Pain Orientation  Right    Pain Type  Chronic pain    Pain  Radiating Towards  radiating into R jaw                       [facility] Adult PT Treatment/Exercise - 08/31/18 0001      Shoulder Exercises: Supine   External Rotation  AAROM;Right;10 reps    External Rotation Limitations  with cane to tolerance    Flexion  AAROM;Right;10 reps    Flexion Limitations  with cane to tolerance    ABduction  AAROM;Right;10 reps    ABduction Limitations  with cane to tolerance   heavy cues for form     Shoulder Exercises: Sidelying   External Rotation  Strengthening;Right;10 reps;Weights    External Rotation Weight (lbs)  1,2    External Rotation Limitations  2x10; dowel under elbow; cues for scap retraction   cues for slight scap retraction and avoid shoulder hike   ABduction  Strengthening;Right;10 reps;Weights    ABduction Weight (lbs)  1    ABduction Limitations  thumb up      Shoulder Exercises: Standing   Extension  Strengthening;Both;Theraband;15 reps    Theraband Level (Shoulder Extension)  Level 2 (Red)    Extension Limitations  good form    Row  Strengthening;Both;Theraband;15 reps    Theraband Level (Shoulder Row)  Level 2 (Red)    Row Limitations  cues to stop at neutral      Shoulder Exercises: ROM/Strengthening   UBE (Upper Arm Bike)  L2 x 3 min forward/3 min back      Shoulder Exercises: Stretch   Corner Stretch  2 reps;30 seconds    Corner Stretch Limitations  R shoulder 90/90 pec stretch at doorway to tolerance      Manual Therapy   Manual Therapy  Soft tissue mobilization;Myofascial release    Manual therapy comments  sitting    Soft tissue mobilization  STM and IASTM to R UT, LS, cervical paraspinals, suboccipitals,- soft tissue restriction throughout, most tenderness in suboccipitals    Myofascial Release  manual TPR to R LS, UT, and suboccipitals      Neck Exercises: Stretches   Levator Stretch  Right;2 reps;30 seconds    Levator Stretch Limitations  with gentle patient OP             PT Education -  08/31/18 1355    Education Details  update to HEP       PT Short Term Goals - 08/22/18 1013      PT SHORT TERM GOAL #1   Title  pt  independent with initial HEP by 03/28/15    Time  3    Period  Weeks    Status  Achieved    Target Date  09/06/18        PT Long Term Goals - 08/22/18 1013      PT LONG TERM GOAL #1   Title  Patient will be independent with advanced HEP.    Time  6    Period  Weeks    Status  On-going      PT LONG TERM GOAL #2   Title  Patient to demonstrate R shoulder AROM non-painful and WFL.    Time  6    Period  Weeks    Status  On-going      PT LONG TERM GOAL #3   Title  Patient to demonstrate cervical AROM WFL and without pain limiting.     Time  6    Period  Weeks    Status  On-going      PT LONG TERM GOAL #4   Title  Patient to demonstrate R shoulder strength >=4+/5.    Time  6    Period  Weeks    Status  On-going      PT LONG TERM GOAL #5   Title  Patient to demonstrate and report increased postural awareness throughout the day.     Time  6    Period  Weeks    Status  On-going      PT LONG TERM GOAL #6   Title  Patient to return to gym activities without pain limiting.     Time  6    Period  Weeks    Status  On-going            Plan - 08/31/18 1355    Clinical Impression Statement  Patient arrived to session with report of some improvement in R neck and shoulder pain, enabling her to clean the house. Patient requested STM again today as she notes benefit from this in recent sessions. Tolerated STM and IASTM to R UT, LS, cervical paraspinals, suboccipitals, with soft tissue restriction throughout, most tenderness in suboccipitals. Patient reported resolution of R sided jaw pain after manual therapy. Tolerated R shoulder AAROM with good ROM, especially into ER. Able to perform sidelying ER with light weighted resistance with cues for slight scapular retraction as patient still with tendency to round shoulders. Good carryover of proper  form with shoulder extension, however still requiring cues to correct form with rows. Update HEP with shoulder AAROM- patient reported understanding. Patient reported feeling 75% improved since starting PT.     Clinical Impairments Affecting Rehab Potential  vasovagal episode, TMJ dislocation, plantar fasciitis, HLD, GERD, fibromyalgia, chronic back pain, B trigger finger release, R TKA 2018, R mallet finger, R L3-4 laminectomy/decompression 2016, R knee arthroscopy x3, L morton's neuroma, L thumb CMC fusion 2015, B carpal tunnel release 2005    PT Treatment/Interventions  ADLs/Self Care Home Management;Cryotherapy;Electrical Stimulation;Iontophoresis 4mg /ml Dexamethasone;Functional mobility training;Ultrasound;Moist Heat;Therapeutic activities;Therapeutic exercise;Neuromuscular re-education;Patient/family education;Passive range of motion;Scar mobilization;Manual techniques;Dry needling;Energy conservation;Splinting;Taping;Vasopneumatic Device    PT Next Visit Plan  progress neck and shoulder ROM, periscapular strengthening     Consulted and Agree with Plan of Care  Patient       Patient will benefit from skilled therapeutic intervention in order to improve the following deficits and impairments:  Decreased activity tolerance, Decreased strength, Increased fascial restricitons, Pain, Impaired UE functional use, Increased muscle spasms, Improper body mechanics, Decreased  range of motion, Impaired flexibility, Postural dysfunction  Visit Diagnosis: Chronic right shoulder pain  Stiffness of right shoulder, not elsewhere classified  Cervicalgia  Abnormal posture  Other symptoms and signs involving the musculoskeletal system     Problem List Patient Active Problem List   Diagnosis Date Noted  . History of food allergy 02/01/2018  . OA (osteoarthritis) of knee 09/06/2016  . Aortic atherosclerosis ([place]) 05/07/2016  . Recurrent urticaria 06/02/2015  . Allergic rhinitis with a probable  nonallergic component 06/02/2015  . 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 Lumbar stenosis 02/11/2015  . Abdominal pain, chronic, right lower quadrant 01/07/2015  . Right hip pain 10/31/2014  . Vitamin D deficiency 10/31/2014  . Osteopenia 10/18/2014  . OSA (obstructive sleep apnea) 07/16/2014  . Migraines 07/16/2014  . TMJ (dislocation of temporomandibular joint) 07/16/2014  . HLD (hyperlipidemia) 03/22/2014  . S/P Nissen fundoplication May 0802 23/36/1224  . Dysphagia 01/24/2008  . DEPRESSION/ANXIETY 12/02/2007  . ESOPHAGEAL STRICTURE 12/02/2007  . GERD 12/02/2007  . Irritable bowel syndrome 12/02/2007  . Fibromyalgia 12/02/2007  . VASOVAGAL SYNCOPE 12/02/2007     Janene Harvey, PT, DPT 08/31/18 1:58 PM   [facility] [address]  [place], [place], [address] Phone: [phone]   Fax:  [phone]  Name: Vanessa Oliver MRN: [id number] Date of Birth: 01/11/1951

## 2018-08-31 NOTE — Telephone Encounter (Signed)
Spoke to pt. She request gel. I called the pharmacy and requested that they send gel instead (our EMR defaults to cream with gel is chosen).  [facility] spoke to pharmacist. He updated for finacea gel bid #50 gram tube dispense 3 tubes with 2 refills.  He cancelled cream.

## 2018-09-01 ENCOUNTER — Telehealth: Payer: Self-pay

## 2018-09-01 NOTE — Telephone Encounter (Signed)
PA form received from [place]. Form completed and faxed to [phone]. Awaiting determination.

## 2018-09-04 DIAGNOSIS — F4310 Post-traumatic stress disorder, unspecified: Secondary | ICD-10-CM | POA: Diagnosis not present

## 2018-09-04 NOTE — Telephone Encounter (Signed)
PA approved through 09/01/2019.

## 2018-09-05 ENCOUNTER — Ambulatory Visit: Payer: Medicare Other | Admitting: Physical Therapy

## 2018-09-05 ENCOUNTER — Encounter: Payer: Self-pay | Admitting: Physical Therapy

## 2018-09-05 DIAGNOSIS — G8929 Other chronic pain: Secondary | ICD-10-CM

## 2018-09-05 DIAGNOSIS — R293 Abnormal posture: Secondary | ICD-10-CM | POA: Diagnosis not present

## 2018-09-05 DIAGNOSIS — R29898 Other symptoms and signs involving the musculoskeletal system: Secondary | ICD-10-CM | POA: Diagnosis not present

## 2018-09-05 DIAGNOSIS — M542 Cervicalgia: Secondary | ICD-10-CM | POA: Diagnosis not present

## 2018-09-05 DIAGNOSIS — M25611 Stiffness of right shoulder, not elsewhere classified: Secondary | ICD-10-CM | POA: Diagnosis not present

## 2018-09-05 DIAGNOSIS — M25511 Pain in right shoulder: Principal | ICD-10-CM

## 2018-09-05 NOTE — Therapy (Signed)
[facility] [address]  [place], [place], [address] Phone: [phone]   Fax:  [phone]  Physical Therapy Treatment  Patient Details  Name: Vanessa Oliver MRN: [id number] Date of Birth: 09/09/1950 Referring Provider (PT): Karlton Lemon, MD   Encounter Date: 09/05/2018  PT End of Session - 09/05/18 1012    Visit Number  6    Number of Visits  13    Date for PT Re-Evaluation  09/27/18    Authorization Type  Medicare & Tricare    PT Start Time  0930    PT Stop Time  1012    PT Time Calculation (min)  42 min    Activity Tolerance  Patient tolerated treatment well    Behavior During Therapy  [facility] for tasks assessed/performed       Past Medical History:  Diagnosis Date  . Allergy    takes Singulair at night  . Anxiety   . Arthritis   . Cataract   . Chronic back pain    stenosis  . Depression    takes CYmbalta daily  . Esophageal stricture   . Family history of adverse reaction to anesthesia    oldest brother had trouble with anesthesia a long time ago but can't recall what  . Fibromyalgia   . Fibromyalgia   . GERD (gastroesophageal reflux disease)    takes Omeprazole daily  . Hemorrhoids   . History of bronchitis   . Hyperlipidemia   . IBS (irritable bowel syndrome)   . Joint pain   . Joint swelling   . Plantar fasciitis   . Rosacea   . Rosacea   . Sleep apnea    cpap- settings at 10 breathing   . TMJ (dislocation of temporomandibular joint)   . TMJ (temporomandibular joint disorder)   . Vasovagal episode back in the 80's  . Vasovagal syncope   . Weakness    right leg    Past Surgical History:  Procedure Laterality Date  . ABDOMINAL HYSTERECTOMY  1997  . CARPAL TUNNEL RELEASE Bilateral 2004  . CARPOMETACARPAL (CMC) FUSION OF THUMB  2015   thumb  . CATARACT EXTRACTION Right 2013   Dr Ellison Hughs  . CATARACT EXTRACTION Left 04/2016  . COLONOSCOPY    . COLONOSCOPY WITH  ESOPHAGOGASTRODUODENOSCOPY (EGD) AND ESOPHAGEAL DILATION (ED)    . EYE SURGERY     scar tissue removed after cataract surgery  . 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 KNEE ARTHROSCOPY Right 1994   x 3  . KNEE SURGERY Right 01/21/2006  . LUMBAR LAMINECTOMY/DECOMPRESSION MICRODISCECTOMY Right 02/11/2015   Procedure: Laminectomy and Foraminotomy - Right - L3-L4;  Surgeon: Earnie Larsson, MD;  Location: [facility];  Service: Neurosurgery;  Laterality: Right;  Laminectomy and Foraminotomy - Right - L3-L4  . mallet finger Right 2011  . NASAL SEPTUM SURGERY  1970  . NISSEN FUNDOPLICATION  2297  . RETINAL LASER PROCEDURE Right 02/01/2011   right eye   . TONSILLECTOMY  1981  . TOTAL KNEE ARTHROPLASTY Right 09/06/2016   Procedure: RIGHT TOTAL KNEE ARTHROPLASTY;  Surgeon: Gaynelle Arabian, MD;  Location: [facility];  Service: Orthopedics;  Laterality: Right;  . TRIGGER FINGER RELEASE Bilateral 2005  . UPPER GASTROINTESTINAL ENDOSCOPY    . VARICOSE VEIN SURGERY Bilateral 2007, 2009   left 2007, right 2009  . VITRECTOMY Right 2012    There were no vitals filed for this visit.  Subjective  Assessment - 09/05/18 0930    Subjective  Reports that she is overall feeling a lot better- does not have a HA but does have a little shoulder pain. Notes that she had a new "weird floater" in her eye the other day which still remains. Denies any other new symptoms .    Pertinent History  vasovagal episode, TMJ dislocation, plantar fasciitis, HLD, GERD, fibromyalgia, chronic back pain, B trigger finger release, R TKA 2018, R mallet finger, R L3-4 laminectomy/decompression 2016, R knee arthroscopy x3, L morton's neuroma, L thumb CMC fusion 2015, B carpal tunnel release 2005    Diagnostic tests  none recent    Patient Stated Goals  "that this would not hurt again and get back to my weights"    Currently in Pain?  Yes    Pain Score  4     Pain Location  Shoulder    Pain Orientation  Right    Pain Descriptors /  Indicators  Sore    Pain Type  Chronic pain                       [facility] Adult PT Treatment/Exercise - 09/05/18 0001      Shoulder Exercises: Seated   Flexion  AAROM;10 reps;Right    Flexion Limitations  with wand, to tolerance    Abduction  AAROM;Right;10 reps    Other Seated Exercises  R shoulder abduction 1# x10 to tolerance    Other Seated Exercises  R shoulder scaption to tolerance with 1# x10   cues for positioninng     Shoulder Exercises: Prone   Flexion  Strengthening;Right;10 reps    Flexion Limitations  prone "Y" over green pball   mild c/o R shoudler pain- better with change in angle   Extension  Strengthening;Right;10 reps;Weights    Extension Limitations  prone "I" over green pball    Horizontal ABduction 1  Strengthening;Right;10 reps    Horizontal ABduction 1 Limitations  prone "T" over green pball   manual cues for proper positioning and avoiding shoudler hik     Shoulder Exercises: Sidelying   External Rotation  Strengthening;Right;10 reps;Weights    External Rotation Weight (lbs)  0,1,2    External Rotation Limitations  3x10; dowel under elbow; cues for scap retraction    ABduction  Strengthening;Right;10 reps;Weights    ABduction Weight (lbs)  2    ABduction Limitations  thumb up   cues for decreased speed and increased control     Shoulder Exercises: Standing   External Rotation  Strengthening;Right;10 reps;Theraband    Theraband Level (Shoulder External Rotation)  Level 1 (Yellow)    External Rotation Limitations  cues to avoid wrist extension    Internal Rotation  Strengthening;Right;10 reps;Theraband    Theraband Level (Shoulder Internal Rotation)  Level 1 (Yellow)    Internal Rotation Limitations  cues to stop at neutral      Shoulder Exercises: ROM/Strengthening   UBE (Upper Arm Bike)  L2 x 3 min forward/3 min back      Neck Exercises: Stretches   Upper Trapezius Stretch  Right;30 seconds;1 rep    Upper Trapezius Stretch  Limitations  to tolerance    Levator Stretch  Right;30 seconds;1 rep    Levator Stretch Limitations  with gentle patient OP             PT Education - 09/05/18 1011    Education Details  update to HEP; administered yellow TB    Person(s)  Educated  Patient    Methods  Explanation;Demonstration;Tactile cues;Verbal cues;Handout    Comprehension  Verbalized understanding;Returned demonstration       PT Short Term Goals - 08/22/18 1013      PT SHORT TERM GOAL #1   Title  pt independent with initial HEP by 03/28/15    Time  3    Period  Weeks    Status  Achieved    Target Date  09/06/18        PT Long Term Goals - 08/22/18 1013      PT LONG TERM GOAL #1   Title  Patient will be independent with advanced HEP.    Time  6    Period  Weeks    Status  On-going      PT LONG TERM GOAL #2   Title  Patient to demonstrate R shoulder AROM non-painful and WFL.    Time  6    Period  Weeks    Status  On-going      PT LONG TERM GOAL #3   Title  Patient to demonstrate cervical AROM WFL and without pain limiting.     Time  6    Period  Weeks    Status  On-going      PT LONG TERM GOAL #4   Title  Patient to demonstrate R shoulder strength >=4+/5.    Time  6    Period  Weeks    Status  On-going      PT LONG TERM GOAL #5   Title  Patient to demonstrate and report increased postural awareness throughout the day.     Time  6    Period  Weeks    Status  On-going      PT LONG TERM GOAL #6   Title  Patient to return to gym activities without pain limiting.     Time  6    Period  Weeks    Status  On-going            Plan - 09/05/18 1013    Clinical Impression Statement  Patient arrived to session with report of improvement in neck and HA pain. Having some mild R shoulder pain today. Reviewed sitting R shoulder AAROM with wand as patient reporting that she has been doing this sitting rather than supine- corrected hand positioning with abduction as patient performing this  with palm down and shoulder internally rotated. R shoulder flare possibly coming form this incorrect positioning. Reported muscle burn with resisted IR and ER today with cues to correct form. Updated HEP to include these exercises as patient had good carryover of form. Patient reported understanding and with c/o mild muscle fatigue at end of session. Declined modalities. Plan to continue progressing RTC strengthening in future sessions.    Clinical Impairments Affecting Rehab Potential  vasovagal episode, TMJ dislocation, plantar fasciitis, HLD, GERD, fibromyalgia, chronic back pain, B trigger finger release, R TKA 2018, R mallet finger, R L3-4 laminectomy/decompression 2016, R knee arthroscopy x3, L morton's neuroma, L thumb CMC fusion 2015, B carpal tunnel release 2005    PT Treatment/Interventions  ADLs/Self Care Home Management;Cryotherapy;Electrical Stimulation;Iontophoresis 4mg /ml Dexamethasone;Functional mobility training;Ultrasound;Moist Heat;Therapeutic activities;Therapeutic exercise;Neuromuscular re-education;Patient/family education;Passive range of motion;Scar mobilization;Manual techniques;Dry needling;Energy conservation;Splinting;Taping;Vasopneumatic Device    PT Next Visit Plan  progress neck and shoulder ROM, periscapular strengthening     Consulted and Agree with Plan of Care  Patient       Patient will benefit from skilled therapeutic intervention  in order to improve the following deficits and impairments:  Decreased activity tolerance, Decreased strength, Increased fascial restricitons, Pain, Impaired UE functional use, Increased muscle spasms, Improper body mechanics, Decreased range of motion, Impaired flexibility, Postural dysfunction  Visit Diagnosis: Chronic right shoulder pain  Stiffness of right shoulder, not elsewhere classified  Cervicalgia  Abnormal posture  Other symptoms and signs involving the musculoskeletal system     Problem List Patient Active Problem  List   Diagnosis Date Noted  . History of food allergy 02/01/2018  . OA (osteoarthritis) of knee 09/06/2016  . Aortic atherosclerosis ([place]) 05/07/2016  . Recurrent urticaria 06/02/2015  . Allergic rhinitis with a probable nonallergic component 06/02/2015  . 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 Lumbar stenosis 02/11/2015  . Abdominal pain, chronic, right lower quadrant 01/07/2015  . Right hip pain 10/31/2014  . Vitamin D deficiency 10/31/2014  . Osteopenia 10/18/2014  . OSA (obstructive sleep apnea) 07/16/2014  . Migraines 07/16/2014  . TMJ (dislocation of temporomandibular joint) 07/16/2014  . HLD (hyperlipidemia) 03/22/2014  . S/P Nissen fundoplication May 9323 55/73/2202  . Dysphagia 01/24/2008  . DEPRESSION/ANXIETY 12/02/2007  . ESOPHAGEAL STRICTURE 12/02/2007  . GERD 12/02/2007  . Irritable bowel syndrome 12/02/2007  . Fibromyalgia 12/02/2007  . VASOVAGAL SYNCOPE 12/02/2007     Janene Harvey, PT, DPT 09/05/18 10:47 AM   [facility] [address]  [place], [place], [address] Phone: [phone]   Fax:  [phone]  Name: Vanessa Oliver MRN: [id number] Date of Birth: [birth year]

## 2018-09-07 ENCOUNTER — Ambulatory Visit: Payer: Medicare Other | Admitting: Physical Therapy

## 2018-09-07 ENCOUNTER — Encounter: Payer: Self-pay | Admitting: Physical Therapy

## 2018-09-07 DIAGNOSIS — G8929 Other chronic pain: Secondary | ICD-10-CM | POA: Diagnosis not present

## 2018-09-07 DIAGNOSIS — M542 Cervicalgia: Secondary | ICD-10-CM | POA: Diagnosis not present

## 2018-09-07 DIAGNOSIS — M25611 Stiffness of right shoulder, not elsewhere classified: Secondary | ICD-10-CM

## 2018-09-07 DIAGNOSIS — M25511 Pain in right shoulder: Principal | ICD-10-CM

## 2018-09-07 DIAGNOSIS — R293 Abnormal posture: Secondary | ICD-10-CM

## 2018-09-07 DIAGNOSIS — R29898 Other symptoms and signs involving the musculoskeletal system: Secondary | ICD-10-CM

## 2018-09-07 NOTE — Therapy (Signed)
[facility] [address]  [place], [place], [address] Phone: [phone]   Fax:  [phone]  Physical Therapy Treatment  Patient Details  Name: Vanessa Oliver MRN: [id number] Date of Birth: [birth year] Referring Provider (PT): Karlton Lemon, MD   Encounter Date: 09/07/2018  PT End of Session - 09/07/18 1356    Visit Number  7    Number of Visits  13    Date for PT Re-Evaluation  09/27/18    Authorization Type  Medicare & Tricare    PT Start Time  1314    PT Stop Time  1356    PT Time Calculation (min)  42 min    Activity Tolerance  Patient tolerated treatment well    Behavior During Therapy  [facility] for tasks assessed/performed       Past Medical History:  Diagnosis Date  . Allergy    takes Singulair at night  . Anxiety   . Arthritis   . Cataract   . Chronic back pain    stenosis  . Depression    takes CYmbalta daily  . Esophageal stricture   . Family history of adverse reaction to anesthesia    oldest brother had trouble with anesthesia a long time ago but can't recall what  . Fibromyalgia   . Fibromyalgia   . GERD (gastroesophageal reflux disease)    takes Omeprazole daily  . Hemorrhoids   . History of bronchitis   . Hyperlipidemia   . IBS (irritable bowel syndrome)   . Joint pain   . Joint swelling   . Plantar fasciitis   . Rosacea   . Rosacea   . Sleep apnea    cpap- settings at 10 breathing   . TMJ (dislocation of temporomandibular joint)   . TMJ (temporomandibular joint disorder)   . Vasovagal episode back in the 80's  . Vasovagal syncope   . Weakness    right leg    Past Surgical History:  Procedure Laterality Date  . ABDOMINAL HYSTERECTOMY  1997  . CARPAL TUNNEL RELEASE Bilateral 2004  . CARPOMETACARPAL (CMC) FUSION OF THUMB  2015   thumb  . CATARACT EXTRACTION Right 2013   Dr Ellison Hughs  . CATARACT EXTRACTION Left 04/2016  . COLONOSCOPY    . COLONOSCOPY WITH  ESOPHAGOGASTRODUODENOSCOPY (EGD) AND ESOPHAGEAL DILATION (ED)    . EYE SURGERY     scar tissue removed after cataract surgery  . 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 KNEE ARTHROSCOPY Right 1994   x 3  . KNEE SURGERY Right 01/21/2006  . LUMBAR LAMINECTOMY/DECOMPRESSION MICRODISCECTOMY Right 02/11/2015   Procedure: Laminectomy and Foraminotomy - Right - L3-L4;  Surgeon: Earnie Larsson, MD;  Location: [facility];  Service: Neurosurgery;  Laterality: Right;  Laminectomy and Foraminotomy - Right - L3-L4  . mallet finger Right 2011  . NASAL SEPTUM SURGERY  1970  . NISSEN FUNDOPLICATION  3664  . RETINAL LASER PROCEDURE Right 02/01/2011   right eye   . TONSILLECTOMY  1981  . TOTAL KNEE ARTHROPLASTY Right 09/06/2016   Procedure: RIGHT TOTAL KNEE ARTHROPLASTY;  Surgeon: Gaynelle Arabian, MD;  Location: [facility];  Service: Orthopedics;  Laterality: Right;  . TRIGGER FINGER RELEASE Bilateral 2005  . UPPER GASTROINTESTINAL ENDOSCOPY    . VARICOSE VEIN SURGERY Bilateral 2007, 2009   left 2007, right 2009  . VITRECTOMY Right 2012    There were no vitals filed for this visit.  Subjective  Assessment - 09/07/18 1314    Subjective  Patient reports that she has been "zooming" all day today and has been very busy. Notes that she has had more pain in the shoulder blade rather than the neck lately.     Pertinent History  vasovagal episode, TMJ dislocation, plantar fasciitis, HLD, GERD, fibromyalgia, chronic back pain, B trigger finger release, R TKA 2018, R mallet finger, R L3-4 laminectomy/decompression 2016, R knee arthroscopy x3, L morton's neuroma, L thumb CMC fusion 2015, B carpal tunnel release 2005    Diagnostic tests  none recent    Patient Stated Goals  "that this would not hurt again and get back to my weights"    Currently in Pain?  Yes    Pain Score  3     Pain Location  Shoulder    Pain Orientation  Right;Posterior    Pain Descriptors / Indicators  Sore    Pain Type  Chronic pain                        [facility] Adult PT Treatment/Exercise - 09/07/18 0001      Shoulder Exercises: Prone   Other Prone Exercises  prone cervical retraction + scapular retraction 10x3"   cues to maintain chin down     Shoulder Exercises: Standing   Horizontal ABduction  Strengthening;Both;10 reps    Theraband Level (Shoulder Horizontal ABduction)  Level 1 (Yellow)    Horizontal ABduction Limitations  2x10    External Rotation  Strengthening;Right;10 reps;Theraband    Theraband Level (Shoulder External Rotation)  Level 1 (Yellow)    External Rotation Limitations  dowel under elbow   cues for scap retraction   Internal Rotation  Strengthening;Right;10 reps;Theraband    Theraband Level (Shoulder Internal Rotation)  Level 1 (Yellow)    Internal Rotation Limitations  dowel under elbow   cues to stop at neutral   Other Standing Exercises  B ER with yellow TB 2x10    good form after cues     Shoulder Exercises: ROM/Strengthening   UBE (Upper Arm Bike)  L2 x 3 min forward/3 min back    Lat Pull Limitations  wide grip 10x 15#   manual cues to avoid shoulder hiking   Cybex Row Limitations  narrow grip B row 15# x 15   cues for scap retraction   Other ROM/Strengthening Exercises  straight arm pulldown 10x 5#   cues for core contraction and straight elbows     Shoulder Exercises: Stretch   Corner Stretch  2 reps;30 seconds    Corner Stretch Limitations  R shoulder 90/90 pec stretch at doorway to tolerance      Manual Therapy   Manual Therapy  Soft tissue mobilization;Myofascial release    Manual therapy comments  prone    Soft tissue mobilization  STM to R rhomboids, thoracic paraspinals, LS, UT- most tenderness and restriction in rhomboid and LS    Myofascial Release  manual TPR to R LS, rhomboid               PT Short Term Goals - 08/22/18 1013      PT SHORT TERM GOAL #1   Title  pt independent with initial HEP by 03/28/15    Time  3    Period  Weeks    Status   Achieved    Target Date  09/06/18        PT Long Term Goals - 08/22/18 1013  PT LONG TERM GOAL #1   Title  Patient will be independent with advanced HEP.    Time  6    Period  Weeks    Status  On-going      PT LONG TERM GOAL #2   Title  Patient to demonstrate R shoulder AROM non-painful and WFL.    Time  6    Period  Weeks    Status  On-going      PT LONG TERM GOAL #3   Title  Patient to demonstrate cervical AROM WFL and without pain limiting.     Time  6    Period  Weeks    Status  On-going      PT LONG TERM GOAL #4   Title  Patient to demonstrate R shoulder strength >=4+/5.    Time  6    Period  Weeks    Status  On-going      PT LONG TERM GOAL #5   Title  Patient to demonstrate and report increased postural awareness throughout the day.     Time  6    Period  Weeks    Status  On-going      PT LONG TERM GOAL #6   Title  Patient to return to gym activities without pain limiting.     Time  6    Period  Weeks    Status  On-going            Plan - 09/07/18 1357    Clinical Impression Statement  Patient arrived to session with report that she has noticed that her pain is more in the back of shoulder rather than neck lately. Patient tolerated STM to R rhomboids, thoracic paraspinals, LS, UT- most tenderness and restriction in rhomboid and LS today. Reviewed resisted IR and ER with minor cues to maintain scapular retraction and to stop at neutral. Introduced light weight machine strengthening with instruction on proper technique. Patient still intermittently with tendency to hike shoulders or bring shoulders back to max contraction to the point of straining. Patient reports that she is feeling better and with no HA in the past week. Patient with no complaints at end of session.    Clinical Impairments Affecting Rehab Potential  vasovagal episode, TMJ dislocation, plantar fasciitis, HLD, GERD, fibromyalgia, chronic back pain, B trigger finger release, R TKA 2018, R  mallet finger, R L3-4 laminectomy/decompression 2016, R knee arthroscopy x3, L morton's neuroma, L thumb CMC fusion 2015, B carpal tunnel release 2005    PT Treatment/Interventions  ADLs/Self Care Home Management;Cryotherapy;Electrical Stimulation;Iontophoresis 4mg /ml Dexamethasone;Functional mobility training;Ultrasound;Moist Heat;Therapeutic activities;Therapeutic exercise;Neuromuscular re-education;Patient/family education;Passive range of motion;Scar mobilization;Manual techniques;Dry needling;Energy conservation;Splinting;Taping;Vasopneumatic Device    PT Next Visit Plan  progress neck and shoulder ROM, periscapular strengthening     Consulted and Agree with Plan of Care  Patient       Patient will benefit from skilled therapeutic intervention in order to improve the following deficits and impairments:  Decreased activity tolerance, Decreased strength, Increased fascial restricitons, Pain, Impaired UE functional use, Increased muscle spasms, Improper body mechanics, Decreased range of motion, Impaired flexibility, Postural dysfunction  Visit Diagnosis: Chronic right shoulder pain  Stiffness of right shoulder, not elsewhere classified  Cervicalgia  Abnormal posture  Other symptoms and signs involving the musculoskeletal system     Problem List Patient Active Problem List   Diagnosis Date Noted  . History of food allergy 02/01/2018  . OA (osteoarthritis) of knee 09/06/2016  . Aortic atherosclerosis ([place])  05/07/2016  . Recurrent urticaria 06/02/2015  . Allergic rhinitis with a probable nonallergic component 06/02/2015  . 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 Lumbar stenosis 02/11/2015  . Abdominal pain, chronic, right lower quadrant 01/07/2015  . Right hip pain 10/31/2014  . Vitamin D deficiency 10/31/2014  . Osteopenia 10/18/2014  . OSA (obstructive sleep apnea) 07/16/2014  . Migraines 07/16/2014  . TMJ (dislocation of temporomandibular  joint) 07/16/2014  . HLD (hyperlipidemia) 03/22/2014  . S/P Nissen fundoplication May 4935 52/17/4715  . Dysphagia 01/24/2008  . DEPRESSION/ANXIETY 12/02/2007  . ESOPHAGEAL STRICTURE 12/02/2007  . GERD 12/02/2007  . Irritable bowel syndrome 12/02/2007  . Fibromyalgia 12/02/2007  . VASOVAGAL SYNCOPE 12/02/2007     Janene Harvey, PT, DPT 09/07/18 2:01 PM   [facility] [address]  [address] [place], [place], [address] Phone: [phone]   Fax:  [phone]  Name: Vanessa Oliver MRN: [id number] Date of Birth: Aug 03, 1950

## 2018-09-12 ENCOUNTER — Ambulatory Visit: Payer: Medicare Other | Attending: Family Medicine | Admitting: Physical Therapy

## 2018-09-12 ENCOUNTER — Encounter: Payer: Self-pay | Admitting: Physical Therapy

## 2018-09-12 DIAGNOSIS — M25611 Stiffness of right shoulder, not elsewhere classified: Secondary | ICD-10-CM | POA: Insufficient documentation

## 2018-09-12 DIAGNOSIS — M542 Cervicalgia: Secondary | ICD-10-CM | POA: Insufficient documentation

## 2018-09-12 DIAGNOSIS — M25511 Pain in right shoulder: Secondary | ICD-10-CM | POA: Insufficient documentation

## 2018-09-12 DIAGNOSIS — R293 Abnormal posture: Secondary | ICD-10-CM | POA: Insufficient documentation

## 2018-09-12 DIAGNOSIS — G8929 Other chronic pain: Secondary | ICD-10-CM | POA: Insufficient documentation

## 2018-09-12 DIAGNOSIS — R29898 Other symptoms and signs involving the musculoskeletal system: Secondary | ICD-10-CM | POA: Diagnosis not present

## 2018-09-12 NOTE — Therapy (Signed)
[facility] [address]  [place], [place], [address] Phone: [phone]   Fax:  [phone]  Physical Therapy Treatment  Patient Details  Name: Vanessa Oliver MRN: [id number] Date of Birth: 06-12-1951 Referring Provider (PT): Karlton Lemon, MD   Encounter Date: 09/12/2018  PT End of Session - 09/12/18 1056    Visit Number  8    Number of Visits  13    Date for PT Re-Evaluation  09/27/18    Authorization Type  Medicare & Tricare    PT Start Time  0932    PT Stop Time  1057    PT Time Calculation (min)  42 min    Activity Tolerance  Patient tolerated treatment well    Behavior During Therapy  [facility] for tasks assessed/performed       Past Medical History:  Diagnosis Date  . Allergy    takes Singulair at night  . Anxiety   . Arthritis   . Cataract   . Chronic back pain    stenosis  . Depression    takes CYmbalta daily  . Esophageal stricture   . Family history of adverse reaction to anesthesia    oldest brother had trouble with anesthesia a long time ago but can't recall what  . Fibromyalgia   . Fibromyalgia   . GERD (gastroesophageal reflux disease)    takes Omeprazole daily  . Hemorrhoids   . History of bronchitis   . Hyperlipidemia   . IBS (irritable bowel syndrome)   . Joint pain   . Joint swelling   . Plantar fasciitis   . Rosacea   . Rosacea   . Sleep apnea    cpap- settings at 10 breathing   . TMJ (dislocation of temporomandibular joint)   . TMJ (temporomandibular joint disorder)   . Vasovagal episode back in the 80's  . Vasovagal syncope   . Weakness    right leg    Past Surgical History:  Procedure Laterality Date  . ABDOMINAL HYSTERECTOMY  1997  . CARPAL TUNNEL RELEASE Bilateral 2004  . CARPOMETACARPAL (CMC) FUSION OF THUMB  2015   thumb  . CATARACT EXTRACTION Right 2013   Dr Ellison Hughs  . CATARACT EXTRACTION Left 04/2016  . COLONOSCOPY    . COLONOSCOPY WITH  ESOPHAGOGASTRODUODENOSCOPY (EGD) AND ESOPHAGEAL DILATION (ED)    . EYE SURGERY     scar tissue removed after cataract surgery  . 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 KNEE ARTHROSCOPY Right 1994   x 3  . KNEE SURGERY Right 01/21/2006  . LUMBAR LAMINECTOMY/DECOMPRESSION MICRODISCECTOMY Right 02/11/2015   Procedure: Laminectomy and Foraminotomy - Right - L3-L4;  Surgeon: Earnie Larsson, MD;  Location: [facility];  Service: Neurosurgery;  Laterality: Right;  Laminectomy and Foraminotomy - Right - L3-L4  . mallet finger Right 2011  . NASAL SEPTUM SURGERY  1970  . NISSEN FUNDOPLICATION  3557  . RETINAL LASER PROCEDURE Right 02/01/2011   right eye   . TONSILLECTOMY  1981  . TOTAL KNEE ARTHROPLASTY Right 09/06/2016   Procedure: RIGHT TOTAL KNEE ARTHROPLASTY;  Surgeon: Gaynelle Arabian, MD;  Location: [facility];  Service: Orthopedics;  Laterality: Right;  . TRIGGER FINGER RELEASE Bilateral 2005  . UPPER GASTROINTESTINAL ENDOSCOPY    . VARICOSE VEIN SURGERY Bilateral 2007, 2009   left 2007, right 2009  . VITRECTOMY Right 2012    There were no vitals filed for this visit.  Subjective  Assessment - 09/12/18 1017    Subjective  Patient reports that she has a spot in her psoteriro shoulder that has been bothering her. Walked 10,000 steps yesterday and notes that her leg has been bothering her.     Pertinent History  vasovagal episode, TMJ dislocation, plantar fasciitis, HLD, GERD, fibromyalgia, chronic back pain, B trigger finger release, R TKA 2018, R mallet finger, R L3-4 laminectomy/decompression 2016, R knee arthroscopy x3, L morton's neuroma, L thumb CMC fusion 2015, B carpal tunnel release 2005    Diagnostic tests  none recent    Patient Stated Goals  "that this would not hurt again and get back to my weights"    Currently in Pain?  Yes    Pain Score  6     Pain Location  Shoulder    Pain Orientation  Right;Posterior    Pain Descriptors / Indicators  Sharp    Pain Type  Chronic pain                        [facility] Adult PT Treatment/Exercise - 09/12/18 0001      Shoulder Exercises: Standing   External Rotation  Strengthening;Right;10 reps;Theraband    Theraband Level (Shoulder External Rotation)  Level 1 (Yellow)    External Rotation Limitations  good form    Internal Rotation  Strengthening;Right;10 reps;Theraband    Theraband Level (Shoulder Internal Rotation)  Level 1 (Yellow)    Internal Rotation Limitations  cues to stop at neutral   cues for slight scap squeeze   Extension  Strengthening;Both;Theraband;15 reps    Theraband Level (Shoulder Extension)  Level 3 (Green)    Extension Limitations  good form    Scientist, research (life sciences);Theraband;15 reps    Theraband Level (Shoulder Row)  Level 3 (Green)    Row Limitations  good form    Diagonals  Strengthening;Right;10 reps;Theraband    Theraband Level (Shoulder Diagonals)  Level 1 (Yellow)    Diagonals Limitations  cues for proper positioning and to allow more slack to avoid R shoulder pain    Other Standing Exercises  B row + ER with red TB x10   good focus     Shoulder Exercises: ROM/Strengthening   UBE (Upper Arm Bike)  L2 x 3 min forward/3 min back      Shoulder Exercises: Stretch   Other Shoulder Stretches  R shoulder IR stretch with strap 5x5" to tolerance      Manual Therapy   Manual Therapy  Soft tissue mobilization;Myofascial release    Manual therapy comments  sitting    Soft tissue mobilization  STM to R UT, LS, infraspinatus/teres group- TTP and soft tiissue restriction    Myofascial Release  manual TPR to R UT, LS, infraspinatus/teres group      Neck Exercises: Stretches   Upper Trapezius Stretch  Right;30 seconds;2 reps    Upper Trapezius Stretch Limitations  to tolerance    Levator Stretch  Right;30 seconds;2 reps    Levator Stretch Limitations  to tolerance               PT Short Term Goals - 08/22/18 1013      PT SHORT TERM GOAL #1   Title  pt independent with  initial HEP by 03/28/15    Time  3    Period  Weeks    Status  Achieved    Target Date  09/06/18        PT Long Term Goals -  08/22/18 1013      PT LONG TERM GOAL #1   Title  Patient will be independent with advanced HEP.    Time  6    Period  Weeks    Status  On-going      PT LONG TERM GOAL #2   Title  Patient to demonstrate R shoulder AROM non-painful and WFL.    Time  6    Period  Weeks    Status  On-going      PT LONG TERM GOAL #3   Title  Patient to demonstrate cervical AROM WFL and without pain limiting.     Time  6    Period  Weeks    Status  On-going      PT LONG TERM GOAL #4   Title  Patient to demonstrate R shoulder strength >=4+/5.    Time  6    Period  Weeks    Status  On-going      PT LONG TERM GOAL #5   Title  Patient to demonstrate and report increased postural awareness throughout the day.     Time  6    Period  Weeks    Status  On-going      PT LONG TERM GOAL #6   Title  Patient to return to gym activities without pain limiting.     Time  6    Period  Weeks    Status  On-going            Plan - 09/12/18 1057    Clinical Impression Statement  Patient arrived to session with report of increased pain in R posterior shoulder over the past couple of days without known cause. Tolerated STM and manual TPR to R UT, LS, infraspinatus/teres group- TTP and soft tissue restriction present in these areas. Patient reported relief of pain after manual therapy. Patient demonstrated good R shoulder ROM with IR stretch and report of minimal stretch. Introduced R shoulder resisted diagonals with patient requiring cues to promote proper alignment and provide more slack to avoid pain. Provided cues to correct form with resisted IR- patient reported understanding. Ended session with patient reporting improvement in pain levels in R shoulder.     Clinical Impairments Affecting Rehab Potential  vasovagal episode, TMJ dislocation, plantar fasciitis, HLD, GERD,  fibromyalgia, chronic back pain, B trigger finger release, R TKA 2018, R mallet finger, R L3-4 laminectomy/decompression 2016, R knee arthroscopy x3, L morton's neuroma, L thumb CMC fusion 2015, B carpal tunnel release 2005    PT Treatment/Interventions  ADLs/Self Care Home Management;Cryotherapy;Electrical Stimulation;Iontophoresis 4mg /ml Dexamethasone;Functional mobility training;Ultrasound;Moist Heat;Therapeutic activities;Therapeutic exercise;Neuromuscular re-education;Patient/family education;Passive range of motion;Scar mobilization;Manual techniques;Dry needling;Energy conservation;Splinting;Taping;Vasopneumatic Device    PT Next Visit Plan  progress neck and shoulder ROM, periscapular strengthening     Consulted and Agree with Plan of Care  Patient       Patient will benefit from skilled therapeutic intervention in order to improve the following deficits and impairments:  Decreased activity tolerance, Decreased strength, Increased fascial restricitons, Pain, Impaired UE functional use, Increased muscle spasms, Improper body mechanics, Decreased range of motion, Impaired flexibility, Postural dysfunction  Visit Diagnosis: Chronic right shoulder pain  Stiffness of right shoulder, not elsewhere classified  Cervicalgia  Abnormal posture  Other symptoms and signs involving the musculoskeletal system     Problem List Patient Active Problem List   Diagnosis Date Noted  . History of food allergy 02/01/2018  . OA (osteoarthritis) of knee 09/06/2016  . Aortic  atherosclerosis ([place]) 05/07/2016  . Recurrent urticaria 06/02/2015  . Allergic rhinitis with a probable nonallergic component 06/02/2015  . 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 Lumbar stenosis 02/11/2015  . Abdominal pain, chronic, right lower quadrant 01/07/2015  . Right hip pain 10/31/2014  . Vitamin D deficiency 10/31/2014  . Osteopenia 10/18/2014  . OSA (obstructive sleep apnea)  07/16/2014  . Migraines 07/16/2014  . TMJ (dislocation of temporomandibular joint) 07/16/2014  . HLD (hyperlipidemia) 03/22/2014  . S/P Nissen fundoplication May 5573 22/08/5425  . Dysphagia 01/24/2008  . DEPRESSION/ANXIETY 12/02/2007  . ESOPHAGEAL STRICTURE 12/02/2007  . GERD 12/02/2007  . Irritable bowel syndrome 12/02/2007  . Fibromyalgia 12/02/2007  . VASOVAGAL SYNCOPE 12/02/2007     Janene Harvey, PT, DPT 09/12/18 11:00 AM   [facility] [address]  [place], [place], [address] Phone: [phone]   Fax:  [phone]  Name: Vanessa Oliver MRN: [id number] Date of Birth: 27-May-1951

## 2018-09-14 ENCOUNTER — Encounter: Payer: Self-pay | Admitting: Physical Therapy

## 2018-09-14 ENCOUNTER — Ambulatory Visit: Payer: Medicare Other | Admitting: Physical Therapy

## 2018-09-14 DIAGNOSIS — R29898 Other symptoms and signs involving the musculoskeletal system: Secondary | ICD-10-CM | POA: Diagnosis not present

## 2018-09-14 DIAGNOSIS — M25511 Pain in right shoulder: Principal | ICD-10-CM

## 2018-09-14 DIAGNOSIS — M542 Cervicalgia: Secondary | ICD-10-CM

## 2018-09-14 DIAGNOSIS — F4310 Post-traumatic stress disorder, unspecified: Secondary | ICD-10-CM | POA: Diagnosis not present

## 2018-09-14 DIAGNOSIS — G8929 Other chronic pain: Secondary | ICD-10-CM

## 2018-09-14 DIAGNOSIS — R293 Abnormal posture: Secondary | ICD-10-CM

## 2018-09-14 DIAGNOSIS — M25611 Stiffness of right shoulder, not elsewhere classified: Secondary | ICD-10-CM

## 2018-09-14 NOTE — Therapy (Signed)
Vanessa Oliver [address]  [place], [place], [address] Phone: [phone]   Fax:  [phone]  Physical Therapy Progress Note  Patient Details  Name: Vanessa Oliver MRN: [id number] Date of Birth: April 25, 1951 Referring Provider (PT): Karlton Lemon, MD    Progress Note Reporting Period 08/16/18 to 09/14/18  See note below for Objective Data and Assessment of Progress/Goals.    Encounter Date: 09/14/2018  PT End of Session - 09/14/18 1751    Visit Number  9    Number of Visits  13    Date for PT Re-Evaluation  09/27/18    Authorization Type  Medicare & Tricare    PT Start Time  1615    PT Stop Time  1656    PT Time Calculation (min)  41 min    Activity Tolerance  Patient tolerated treatment well    Behavior During Therapy  WFL for tasks assessed/performed       Past Medical History:  Diagnosis Date  . Allergy    takes Singulair at night  . Anxiety   . Arthritis   . Cataract   . Chronic back pain    stenosis  . Depression    takes CYmbalta daily  . Esophageal stricture   . Family history of adverse reaction to anesthesia    oldest brother had trouble with anesthesia a long time ago but can't recall what  . Fibromyalgia   . Fibromyalgia   . GERD (gastroesophageal reflux disease)    takes Omeprazole daily  . Hemorrhoids   . History of bronchitis   . Hyperlipidemia   . IBS (irritable bowel syndrome)   . Joint pain   . Joint swelling   . Plantar fasciitis   . Rosacea   . Rosacea   . Sleep apnea    cpap- settings at 10 breathing   . TMJ (dislocation of temporomandibular joint)   . TMJ (temporomandibular joint disorder)   . Vasovagal episode back in the 80's  . Vasovagal syncope   . Weakness    right leg    Past Surgical History:  Procedure Laterality Date  . ABDOMINAL HYSTERECTOMY  1997  . CARPAL TUNNEL RELEASE Bilateral 2004  . CARPOMETACARPAL (CMC) FUSION OF THUMB  2015   thumb  .  CATARACT EXTRACTION Right 2013   Dr Ellison Hughs  . CATARACT EXTRACTION Left 04/2016  . COLONOSCOPY    . COLONOSCOPY WITH ESOPHAGOGASTRODUODENOSCOPY (EGD) AND ESOPHAGEAL DILATION (ED)    . EYE SURGERY     scar tissue removed after cataract surgery  . 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 KNEE ARTHROSCOPY Right 1994   x 3  . KNEE SURGERY Right 01/21/2006  . LUMBAR LAMINECTOMY/DECOMPRESSION MICRODISCECTOMY Right 02/11/2015   Procedure: Laminectomy and Foraminotomy - Right - L3-L4;  Surgeon: Earnie Larsson, MD;  Location: [facility];  Service: Neurosurgery;  Laterality: Right;  Laminectomy and Foraminotomy - Right - L3-L4  . mallet finger Right 2011  . NASAL SEPTUM SURGERY  1970  . NISSEN FUNDOPLICATION  2841  . RETINAL LASER PROCEDURE Right 02/01/2011   right eye   . TONSILLECTOMY  1981  . TOTAL KNEE ARTHROPLASTY Right 09/06/2016   Procedure: RIGHT TOTAL KNEE ARTHROPLASTY;  Surgeon: Gaynelle Arabian, MD;  Location: [facility];  Service: Orthopedics;  Laterality: Right;  . TRIGGER FINGER RELEASE Bilateral 2005  . UPPER GASTROINTESTINAL ENDOSCOPY    . VARICOSE VEIN SURGERY Bilateral 2007, 2009  left 2007, right 2009  . VITRECTOMY Right 2012    There were no vitals filed for this visit.  Subjective Assessment - 09/14/18 1616    Subjective  Reports that her shoudlers were a little sore yesterday in the middle of the shoulder blades- got better with meds and heat. Reports 80% improvement in pain since initial eval. Improvements in pain levels, HAs, pain with sleeping or on the computer.     Pertinent History  vasovagal episode, TMJ dislocation, plantar fasciitis, HLD, GERD, fibromyalgia, chronic back pain, B trigger finger release, R TKA 2018, R mallet finger, R L3-4 laminectomy/decompression 2016, R knee arthroscopy x3, L morton's neuroma, L thumb CMC fusion 2015, B carpal tunnel release 2005    Diagnostic tests  none recent    Patient Stated Goals  "that this would not hurt again and get back  to my weights"    Currently in Pain?  Yes    Pain Score  4     Pain Location  Shoulder    Pain Orientation  Right;Posterior    Pain Descriptors / Indicators  Aching    Pain Type  Chronic pain         [facility] PT Assessment - 09/14/18 0001      Observation/Other Assessments   Focus on Therapeutic Outcomes (FOTO)   Shoulder: 69 (31% impaired, 40% predicted)      AROM   Right Shoulder Flexion  153 Degrees    Right Shoulder ABduction  172 Degrees   mild pain   Right Shoulder Internal Rotation  --   FIR T9   Right Shoulder External Rotation  --   FER T3   Cervical Flexion  50    Cervical Extension  46   mild pain in B posterior shoulders   Cervical - Right Side Bend  42    Cervical - Left Side Bend  44   mild pain in B posterior shoulder   Cervical - Right Rotation  WFL    Cervical - Left Rotation  mildly limited   mild pain in B posterior shoulder     Strength   Right Shoulder Flexion  4/5    Right Shoulder ABduction  4+/5    Right Shoulder Internal Rotation  4+/5    Right Shoulder External Rotation  4/5                   [facility] Adult PT Treatment/Exercise - 09/14/18 0001      Self-Care   Self-Care  Other Self-Care Comments    Other Self-Care Comments   edu and practice on use of theracane to B rhomboids for pain relief      Shoulder Exercises: Prone   Extension  Strengthening;Right;Weights;15 reps    Extension Weight (lbs)  3    Extension Limitations  cues for scap retraction    Other Prone Exercises  R UE row with 4# x15      Shoulder Exercises: Standing   External Rotation  Strengthening;Right;10 reps;Theraband    Theraband Level (Shoulder External Rotation)  Level 2 (Red)    External Rotation Limitations  cues for scap retraction    Internal Rotation  Strengthening;Right;10 reps;Theraband    Theraband Level (Shoulder Internal Rotation)  Level 2 (Red)    Internal Rotation Limitations  cues to avoid rotating body      Shoulder Exercises:  ROM/Strengthening   UBE (Upper Arm Bike)  L2.5 x 3 min forward/3 min back      Manual  Therapy   Manual Therapy  Soft tissue mobilization;Myofascial release    Soft tissue mobilization  STM to B rhomboids and thoracic paraspinals- increased restriction in R vs L    Myofascial Release  manual TPR to B rhomboids and thoracic paraspinals             PT Education - 09/14/18 1751    Education Details  advised to perform IR/ER with red TB; discussion on objective progress with PT    Person(s) Educated  Patient    Methods  Explanation;Demonstration;Tactile cues;Verbal cues    Comprehension  Verbalized understanding;Returned demonstration       PT Short Term Goals - 09/14/18 1626      PT SHORT TERM GOAL #1   Title  pt independent with initial HEP by 03/28/15    Time  3    Period  Weeks    Status  Achieved    Target Date  09/06/18        PT Long Term Goals - 09/14/18 1626      PT LONG TERM GOAL #1   Title  Patient will be independent with advanced HEP.    Time  6    Period  Weeks    Status  Partially Met   met for current     PT LONG TERM GOAL #2   Title  Patient to demonstrate R shoulder AROM non-painful and WFL.    Time  6    Period  Weeks    Status  Partially Met   improvements in flexion, abduction, and ER AROM; mild pain with IR     PT LONG TERM GOAL #3   Title  Patient to demonstrate cervical AROM WFL and without pain limiting.     Time  6    Period  Weeks    Status  Partially Met   improvements in R SB, R & L rotation     PT LONG TERM GOAL #4   Title  Patient to demonstrate R shoulder strength >=4+/5.    Time  6    Period  Weeks    Status  Partially Met   improvements in all planes, still limited in flexion and ER     PT LONG TERM GOAL #5   Title  Patient to demonstrate and report increased postural awareness throughout the day.     Time  6    Period  Weeks    Status  Achieved      PT LONG TERM GOAL #6   Title  Patient to return to gym activities  without pain limiting.     Time  6    Period  Weeks    Status  On-going   patient reports that she is busy, but plans to return            Plan - 09/14/18 1754    Clinical Impression Statement  Patient arrived to session with report of 80% improvement in shoulders since initial eval. Notes improvements in pain levels with sleeping and computer work as well as decreased HAs. Patient has shown improvements in R shoulder flexion, abduction, and ER AROM; mild pain with IR remaining. Also with improvements in cervical R SB, R & L rotation. Strength testing revealed improvements in all planes, still limited in flexion and ER. Tolerated STM and manual TPR to B rhomboids and thoracic paraspinals, with report of relief of pain after manual therapy. Worked on prone row and extension on R UE with cues  for scapular retraction. Able to progress R shoulder IR and ER with red band with no complaints- mild correction of form required. Patient showing good progress with PT thus far.     Clinical Impairments Affecting Rehab Potential  vasovagal episode, TMJ dislocation, plantar fasciitis, HLD, GERD, fibromyalgia, chronic back pain, B trigger finger release, R TKA 2018, R mallet finger, R L3-4 laminectomy/decompression 2016, R knee arthroscopy x3, L morton's neuroma, L thumb CMC fusion 2015, B carpal tunnel release 2005    PT Treatment/Interventions  ADLs/Self Care Home Management;Cryotherapy;Electrical Stimulation;Iontophoresis 73m/ml Dexamethasone;Functional mobility training;Ultrasound;Moist Heat;Therapeutic activities;Therapeutic exercise;Neuromuscular re-education;Patient/family education;Passive range of motion;Scar mobilization;Manual techniques;Dry needling;Energy conservation;Splinting;Taping;Vasopneumatic Device    PT Next Visit Plan  progress neck and shoulder ROM, periscapular strengthening     Consulted and Agree with Plan of Care  Patient       Patient will benefit from skilled therapeutic  intervention in order to improve the following deficits and impairments:  Decreased activity tolerance, Decreased strength, Increased fascial restricitons, Pain, Impaired UE functional use, Increased muscle spasms, Improper body mechanics, Decreased range of motion, Impaired flexibility, Postural dysfunction  Visit Diagnosis: Chronic right shoulder pain  Stiffness of right shoulder, not elsewhere classified  Cervicalgia  Abnormal posture  Other symptoms and signs involving the musculoskeletal system     Problem List Patient Active Problem List   Diagnosis Date Noted  . History of food allergy 02/01/2018  . OA (osteoarthritis) of knee 09/06/2016  . Aortic atherosclerosis ([place] 05/07/2016  . Recurrent urticaria 06/02/2015  . Allergic rhinitis with a probable nonallergic component 06/02/2015  . 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 Lumbar stenosis 02/11/2015  . Abdominal pain, chronic, right lower quadrant 01/07/2015  . Right hip pain 10/31/2014  . Vitamin D deficiency 10/31/2014  . Osteopenia 10/18/2014  . OSA (obstructive sleep apnea) 07/16/2014  . Migraines 07/16/2014  . TMJ (dislocation of temporomandibular joint) 07/16/2014  . HLD (hyperlipidemia) 03/22/2014  . S/P Nissen fundoplication May [id number] . Dysphagia 01/24/2008  . DEPRESSION/ANXIETY 12/02/2007  . ESOPHAGEAL STRICTURE 12/02/2007  . GERD 12/02/2007  . Irritable bowel syndrome 12/02/2007  . Fibromyalgia 12/02/2007  . VASOVAGAL SYNCOPE 12/02/2007     YJanene Harvey PT, DPT 09/14/18 5:58 PM   [facility] [place] [address]Phone: [phone]  Fax:  [phone] Name: Vanessa TEALLMRN: [id number] of Birth: 105-02-1951

## 2018-09-18 ENCOUNTER — Encounter: Payer: Self-pay | Admitting: Family Medicine

## 2018-09-18 ENCOUNTER — Ambulatory Visit (INDEPENDENT_AMBULATORY_CARE_PROVIDER_SITE_OTHER): Payer: Medicare Other | Admitting: Family Medicine

## 2018-09-18 VITALS — BP 109/68 | HR 90 | Ht 64.0 in | Wt 164.0 lb

## 2018-09-18 DIAGNOSIS — M25511 Pain in right shoulder: Secondary | ICD-10-CM

## 2018-09-18 NOTE — Patient Instructions (Addendum)
You're doing great! Finish your last two physical therapy sessions and continue home exercises going forward. Call me if you need anything otherwise follow up with me as needed.

## 2018-09-19 ENCOUNTER — Encounter: Payer: Self-pay | Admitting: Physical Therapy

## 2018-09-19 ENCOUNTER — Ambulatory Visit: Payer: Medicare Other | Admitting: Physical Therapy

## 2018-09-19 ENCOUNTER — Encounter: Payer: Self-pay | Admitting: Family Medicine

## 2018-09-19 DIAGNOSIS — M25611 Stiffness of right shoulder, not elsewhere classified: Secondary | ICD-10-CM | POA: Diagnosis not present

## 2018-09-19 DIAGNOSIS — M542 Cervicalgia: Secondary | ICD-10-CM

## 2018-09-19 DIAGNOSIS — G8929 Other chronic pain: Secondary | ICD-10-CM

## 2018-09-19 DIAGNOSIS — R29898 Other symptoms and signs involving the musculoskeletal system: Secondary | ICD-10-CM | POA: Diagnosis not present

## 2018-09-19 DIAGNOSIS — R293 Abnormal posture: Secondary | ICD-10-CM | POA: Diagnosis not present

## 2018-09-19 DIAGNOSIS — M25511 Pain in right shoulder: Secondary | ICD-10-CM | POA: Diagnosis not present

## 2018-09-19 NOTE — Progress Notes (Signed)
PCP and consultation requested by: Debbrah Alar, NP  Subjective:   HPI: Patient is a 68 y.o. female here for right shoulder pain.  1/29: Patient reports she's had about 5-6 years of posterior right shoulder pain. Worse with sitting at the computer, with overhead motions, and wakes her up at night. Was working with a trainer at one point and this helped some. Neck is bothering her too. Uses a massager and voltaren gel with minimal relief. Pain 4/10 currently but up to 8/10 and sharp at worst. No bowel/bladder dysfunction. No skin changes, numbness.  3/9: Patient reports she is doing very well with physical therapy and home exercise program. Pain is up to 3 out of 10 posterior superior shoulder and neck. She believes that having to take care of both her husband and mother is contributing to stress and some pain. No radiation into the arms and no numbness.  Past Medical History:  Diagnosis Date  . Allergy    takes Singulair at night  . Anxiety   . Arthritis   . Cataract   . Chronic back pain    stenosis  . Depression    takes CYmbalta daily  . Esophageal stricture   . Family history of adverse reaction to anesthesia    oldest brother had trouble with anesthesia a long time ago but can't recall what  . Fibromyalgia   . Fibromyalgia   . GERD (gastroesophageal reflux disease)    takes Omeprazole daily  . Hemorrhoids   . History of bronchitis   . Hyperlipidemia   . IBS (irritable bowel syndrome)   . Joint pain   . Joint swelling   . Plantar fasciitis   . Rosacea   . Rosacea   . Sleep apnea    cpap- settings at 10 breathing   . TMJ (dislocation of temporomandibular joint)   . TMJ (temporomandibular joint disorder)   . Vasovagal episode back in the 80's  . Vasovagal syncope   . Weakness    right leg    Current Outpatient Medications on File Prior to Visit  Medication Sig Dispense Refill  . Azelaic Acid 15 % cream Apply twice daily to clean dry face 150 g 1   . CALCIUM CITRATE PO Take 1 tablet by mouth daily.    . cholecalciferol (VITAMIN D) 1000 units tablet Take 1 tablet (1,000 Units total) by mouth 2 (two) times daily.    . Coenzyme Q10 200 MG capsule Take 200 mg by mouth daily.    . diclofenac sodium (VOLTAREN) 1 % GEL APPLY 2 GRAMS TOPICALLY TWICE A DAY AS NEEDED 300 g 0  . DULoxetine (CYMBALTA) 60 MG capsule Take 1 capsule (60 mg total) by mouth daily. 90 capsule 1  . EPINEPHrine (EPIPEN 2-PAK) 0.3 mg/0.3 mL IJ SOAJ injection Inject 0.3 mLs (0.3 mg total) into the muscle once. 1 Device 1  . famotidine (PEPCID) 20 MG tablet Take 20 mg by mouth daily.    . fexofenadine (ALLEGRA) 180 MG tablet Take 1 tablet (180 mg total) by mouth daily as needed for allergies or rhinitis. 90 tablet 3  . FIBER PO Take 2 capsules by mouth 2 (two) times daily.    . fluticasone (FLONASE) 50 MCG/ACT nasal spray Place 1 spray into both nostrils daily. 48 g 1  . loratadine (CLARITIN) 10 MG tablet Take 10 mg by mouth daily as needed for allergies.    . Multiple Vitamin (MULTIVITAMIN) tablet Take 1 tablet by mouth daily.    Marland Kitchen  omeprazole (PRILOSEC) 40 MG capsule 1 tablet by mouth once daily. 90 capsule 1  . pravastatin (PRAVACHOL) 40 MG tablet Take 1 tablet (40 mg total) by mouth daily. 90 tablet 1  . Probiotic Product (PROBIOTIC DAILY PO) Take 1 capsule by mouth daily.    . ranitidine (ZANTAC) 150 MG capsule     . vitamin C (ASCORBIC ACID) 500 MG tablet Take 500 mg by mouth daily.     No current facility-administered medications on file prior to visit.     Past Surgical History:  Procedure Laterality Date  . ABDOMINAL HYSTERECTOMY  1997  . CARPAL TUNNEL RELEASE Bilateral 2004  . CARPOMETACARPAL (CMC) FUSION OF THUMB  2015   thumb  . CATARACT EXTRACTION Right 2013   Dr Ellison Hughs  . CATARACT EXTRACTION Left 04/2016  . COLONOSCOPY    . COLONOSCOPY WITH ESOPHAGOGASTRODUODENOSCOPY (EGD) AND ESOPHAGEAL DILATION (ED)    . EYE SURGERY     scar tissue removed after  cataract surgery  . 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 KNEE ARTHROSCOPY Right 1994   x 3  . KNEE SURGERY Right 01/21/2006  . LUMBAR LAMINECTOMY/DECOMPRESSION MICRODISCECTOMY Right 02/11/2015   Procedure: Laminectomy and Foraminotomy - Right - L3-L4;  Surgeon: Earnie Larsson, MD;  Location: [facility];  Service: Neurosurgery;  Laterality: Right;  Laminectomy and Foraminotomy - Right - L3-L4  . mallet finger Right 2011  . NASAL SEPTUM SURGERY  1970  . NISSEN FUNDOPLICATION  0865  . RETINAL LASER PROCEDURE Right 02/01/2011   right eye   . TONSILLECTOMY  1981  . TOTAL KNEE ARTHROPLASTY Right 09/06/2016   Procedure: RIGHT TOTAL KNEE ARTHROPLASTY;  Surgeon: Gaynelle Arabian, MD;  Location: [facility];  Service: Orthopedics;  Laterality: Right;  . TRIGGER FINGER RELEASE Bilateral 2005  . UPPER GASTROINTESTINAL ENDOSCOPY    . VARICOSE VEIN SURGERY Bilateral 2007, 2009   left 2007, right 2009  . VITRECTOMY Right 2012    Allergies  Allergen Reactions  . Aleve [Naproxen Sodium]     Swelling, itching  . Cefuroxime Axetil     Hives / "throat swelling"  . Chlorzoxazone Rash and Other (See Comments)    "breathing problems"   . Oxycodone     Hallucinations, rash  . Penicillins Anaphylaxis and Hives    Has patient had a PCN reaction causing immediate rash, facial/tongue/throat swelling, SOB or lightheadedness with hypotension: Yes Has patient had a PCN reaction causing severe rash involving mucus membranes or skin necrosis: No Has patient had a PCN reaction that required hospitalization No Has patient had a PCN reaction occurring within the last 10 years: No If all of the above answers are "NO", then may proceed with Cephalosporin use.   . Adhesive [Tape]     rash  . Cataflam [Diclofenac Potassium]     Lip swelling  . Clarithromycin Diarrhea  . Flagyl [Metronidazole Hcl]     ?diarrhea  . Fluzone [Flu Virus Vaccine]     Had local redness with high dose.   . Glucosamine Hives  .  Guaifenesin & Derivatives     Stomach upset  . Hydrocodone-Acetaminophen     REACTION: rash, tightening of throat  . Ketorolac Tromethamine     ?reaction  . Pentazocine     Involuntary twitching  . Pneumovax [Pneumococcal Polysaccharide Vaccine] Other (See Comments)    Redness/swelling at site, nausea  . Prevnar [Pneumococcal 13-Val Conj Vacc] Other (See Comments)    Swelling, redness, nausea  .  Smz-Tmp Ds [Sulfamethoxazole W/Trimethoprim (Co-Trimoxazole)]     ?unknown reaction  . Tramadol     constipation  . Trazodone And Nefazodone Other (See Comments)    Scratchy throat / congestion  . Latex Rash    Social History   Socioeconomic History  . Marital status: Married    Spouse name: Not on file  . Number of children: 0  . Years of education: Not on file  . Highest education level: Not on file  Occupational History  . Occupation: Hydrologist: [employer]  . Financial resource strain: Not on file  . Food insecurity:    Worry: Not on file    Inability: Not on file  . Transportation needs:    Medical: Not on file    Non-medical: Not on file  Tobacco Use  . Smoking status: Never Smoker  . Smokeless tobacco: Never Used  Substance and Sexual Activity  . Alcohol use: Yes    Alcohol/week: 0.0 standard drinks    Comment: rarely  . Drug use: No  . Sexual activity: Never    Birth control/protection: Surgical  Lifestyle  . Physical activity:    Days per week: Not on file    Minutes per session: Not on file  . Stress: Not on file  Relationships  . Social connections:    Talks on phone: Not on file    Gets together: Not on file    Attends religious service: Not on file    Active member of club or organization: Not on file    Attends meetings of clubs or organizations: Not on file    Relationship status: Not on file  . Intimate partner violence:    Fear of current or ex partner: Not on file    Emotionally abused: Not on file    Physically  abused: Not on file    Forced sexual activity: Not on file  Other Topics Concern  . Not on file  Social History Narrative   Works as an Glass blower/designer   Married- husband is 75 years older than her   Former Dance movement psychotherapist up up [employer]   Has cats   Enjoys reading   No children    Family History  Problem Relation Age of Onset  . Breast cancer Mother   . Arthritis Mother   . Hyperlipidemia Mother   . Hypertension Mother   . Allergic rhinitis Mother   . Urticaria Mother   . Lung cancer Father   . Pancreatic cancer Father   . Skin cancer Father   . Arthritis Father   . Allergic rhinitis Father   . Emphysema Father   . Lymphoma Brother   . Celiac disease Brother   . Hypertension Brother   . Thyroid disease Brother   . Allergic rhinitis Brother   . Hyperlipidemia Maternal Grandfather   . Heart disease Maternal Grandfather   . Stroke Maternal Grandfather   . Eczema Maternal Aunt   . Asthma Neg Hx   . Immunodeficiency Neg Hx   . Angioedema Neg Hx   . Colon cancer Neg Hx   . Colon polyps Neg Hx   . Esophageal cancer Neg Hx   . Rectal cancer Neg Hx   . Stomach cancer Neg Hx     BP 109/68   Pulse 90   Ht 5\' 4"  (1.626 m)   Wt 164 lb (74.4 kg)   LMP 05/14/1996   BMI 28.15 kg/m  Review of Systems: See HPI above.     Objective:  Physical Exam:  Gen: NAD, comfortable in exam room  Neck: No gross deformity, swelling, bruising. TTP mildly R>L paraspinal cervical regions, rhomboids, trapezius.  No midline/bony TTP. FROM. BUE strength 5/5.   Sensation intact to light touch.   2+ equal reflexes in triceps, biceps, brachioradialis tendons. NV intact distal BUEs.   Assessment & Plan:  1. Neck/upper back pain -secondary strain and spasms of trapezius, scapular stabilizers, rhomboids.  Doing much better with physical therapy and home exercise program.  She will finish her last couple of physical therapy sessions and continue her home exercises going forward.  Topical  medications, heat if needed.  Follow-up PRN.

## 2018-09-19 NOTE — Therapy (Signed)
[facility] [address]  [place], [place], [address] Phone: [phone]   Fax:  [phone]  Physical Therapy Treatment  Patient Details  Name: Vanessa Oliver MRN: [id number] Date of Birth: [birth year] Referring Provider (PT): Karlton Lemon, MD   Encounter Date: 09/19/2018  PT End of Session - 09/19/18 1013    Visit Number  10    Number of Visits  13    Date for PT Re-Evaluation  09/27/18    Authorization Type  Medicare & Tricare    PT Start Time  0925    PT Stop Time  1007    PT Time Calculation (min)  42 min    Activity Tolerance  Patient tolerated treatment well    Behavior During Therapy  [facility] for tasks assessed/performed       Past Medical History:  Diagnosis Date  . Allergy    takes Singulair at night  . Anxiety   . Arthritis   . Cataract   . Chronic back pain    stenosis  . Depression    takes CYmbalta daily  . Esophageal stricture   . Family history of adverse reaction to anesthesia    oldest brother had trouble with anesthesia a long time ago but can't recall what  . Fibromyalgia   . Fibromyalgia   . GERD (gastroesophageal reflux disease)    takes Omeprazole daily  . Hemorrhoids   . History of bronchitis   . Hyperlipidemia   . IBS (irritable bowel syndrome)   . Joint pain   . Joint swelling   . Plantar fasciitis   . Rosacea   . Rosacea   . Sleep apnea    cpap- settings at 10 breathing   . TMJ (dislocation of temporomandibular joint)   . TMJ (temporomandibular joint disorder)   . Vasovagal episode back in the 80's  . Vasovagal syncope   . Weakness    right leg    Past Surgical History:  Procedure Laterality Date  . ABDOMINAL HYSTERECTOMY  1997  . CARPAL TUNNEL RELEASE Bilateral 2004  . CARPOMETACARPAL (CMC) FUSION OF THUMB  2015   thumb  . CATARACT EXTRACTION Right 2013   Dr Ellison Hughs  . CATARACT EXTRACTION Left 04/2016  . COLONOSCOPY    . COLONOSCOPY WITH  ESOPHAGOGASTRODUODENOSCOPY (EGD) AND ESOPHAGEAL DILATION (ED)    . EYE SURGERY     scar tissue removed after cataract surgery  . 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 KNEE ARTHROSCOPY Right 1994   x 3  . KNEE SURGERY Right 01/21/2006  . LUMBAR LAMINECTOMY/DECOMPRESSION MICRODISCECTOMY Right 02/11/2015   Procedure: Laminectomy and Foraminotomy - Right - L3-L4;  Surgeon: Earnie Larsson, MD;  Location: [facility];  Service: Neurosurgery;  Laterality: Right;  Laminectomy and Foraminotomy - Right - L3-L4  . mallet finger Right 2011  . NASAL SEPTUM SURGERY  1970  . NISSEN FUNDOPLICATION  7124  . RETINAL LASER PROCEDURE Right 02/01/2011   right eye   . TONSILLECTOMY  1981  . TOTAL KNEE ARTHROPLASTY Right 09/06/2016   Procedure: RIGHT TOTAL KNEE ARTHROPLASTY;  Surgeon: Gaynelle Arabian, MD;  Location: [facility];  Service: Orthopedics;  Laterality: Right;  . TRIGGER FINGER RELEASE Bilateral 2005  . UPPER GASTROINTESTINAL ENDOSCOPY    . VARICOSE VEIN SURGERY Bilateral 2007, 2009   left 2007, right 2009  . VITRECTOMY Right 2012    There were no vitals filed for this visit.  Subjective  Assessment - 09/19/18 0923    Subjective  Used TENS, heat, massage machine, and HEP yesterday. Has been having pain with pec stretch in L knee. Was released by MD and reports that she will be ready to wrap up with PT next session.     Pertinent History  vasovagal episode, TMJ dislocation, plantar fasciitis, HLD, GERD, fibromyalgia, chronic back pain, B trigger finger release, R TKA 2018, R mallet finger, R L3-4 laminectomy/decompression 2016, R knee arthroscopy x3, L morton's neuroma, L thumb CMC fusion 2015, B carpal tunnel release 2005    Diagnostic tests  none recent    Patient Stated Goals  "that this would not hurt again and get back to my weights"    Currently in Pain?  Yes    Pain Score  2     Pain Location  Shoulder    Pain Orientation  Right;Posterior    Pain Descriptors / Indicators  Aching    Pain  Type  Chronic pain    Pain Score  5    Pain Location  Knee    Pain Orientation  Left;Medial    Pain Descriptors / Indicators  Aching;Dull    Pain Type  Acute pain                       [facility] Adult PT Treatment/Exercise - 09/19/18 0001      Shoulder Exercises: Seated   Flexion  Strengthening;Right;5 reps;Weights    Flexion Weight (lbs)  2    Flexion Limitations  cues to work within free ROM and avoiding straining   cues to bring shoulders back slightly   Other Seated Exercises  R shoulder abduction 2# x10 to tolerance    Other Seated Exercises  R shoulder scaption to tolerance with 2# x10   intermittent nonpainful R shoulder popping     Shoulder Exercises: Prone   Flexion  Strengthening;Both;10 reps    Flexion Limitations  prone Y lift-offs   limited ROM on B UEs d/t weakness; tendency to bend elbows   Extension  Strengthening;Right;Weights;15 reps    Extension Weight (lbs)  3    Extension Limitations  cues to slowly lower with control    Horizontal ABduction 1  Strengthening;Both;10 reps    Horizontal ABduction 1 Limitations  prone T lift-offs    cues for scapular squeeze   Other Prone Exercises  R UE "W" 2x10    intermittent manual cues to maintain 90 deg elbow   Other Prone Exercises  R UE row with 5# x15      Shoulder Exercises: Standing   Horizontal ABduction  Strengthening;Both;10 reps;Theraband    Theraband Level (Shoulder Horizontal ABduction)  Level 2 (Red)    Horizontal ABduction Limitations  cues to decrease speed on retun    External Rotation  Strengthening;10 reps;Theraband;Both    Theraband Level (Shoulder External Rotation)  Level 2 (Red);Level 3 (Green)    External Rotation Limitations  5x green, 5x red   cues for B shoulder retraction     Shoulder Exercises: ROM/Strengthening   UBE (Upper Arm Bike)  L2.5 x 3 min forward/3 min back      Manual Therapy   Manual Therapy  Soft tissue mobilization;Myofascial release    Manual therapy comments   prone    Soft tissue mobilization  STM to R infraspinatus, rhomboid, thoracic paraspinals, LS- most soft tissue restriction in rhomboid and LS    Myofascial Release  manual TPR to R rhomboid & LS  PT Short Term Goals - 09/14/18 1626      PT SHORT TERM GOAL #1   Title  pt independent with initial HEP by 03/28/15    Time  3    Period  Weeks    Status  Achieved    Target Date  09/06/18        PT Long Term Goals - 09/14/18 1626      PT LONG TERM GOAL #1   Title  Patient will be independent with advanced HEP.    Time  6    Period  Weeks    Status  Partially Met   met for current     PT LONG TERM GOAL #2   Title  Patient to demonstrate R shoulder AROM non-painful and WFL.    Time  6    Period  Weeks    Status  Partially Met   improvements in flexion, abduction, and ER AROM; mild pain with IR     PT LONG TERM GOAL #3   Title  Patient to demonstrate cervical AROM WFL and without pain limiting.     Time  6    Period  Weeks    Status  Partially Met   improvements in R SB, R & L rotation     PT LONG TERM GOAL #4   Title  Patient to demonstrate R shoulder strength >=4+/5.    Time  6    Period  Weeks    Status  Partially Met   improvements in all planes, still limited in flexion and ER     PT LONG TERM GOAL #5   Title  Patient to demonstrate and report increased postural awareness throughout the day.     Time  6    Period  Weeks    Status  Achieved      PT LONG TERM GOAL #6   Title  Patient to return to gym activities without pain limiting.     Time  6    Period  Weeks    Status  On-going   patient reports that she is busy, but plans to return            Plan - 09/19/18 1013    Clinical Impression Statement  Patient arrived to session with report of mild but persisting R rhomboid pain but able to manage her symptoms with modalities at home. Reports that she feels comfortable transitioning to HEP next session. Began session with STM and TPR  with patient demonstrating increased soft tissue restriction in rhomboid and LS. Worked on periscapular strengthening in prone with patient demonstrating difficulty d/t muscle weakness on B UEs with Y lift-offs. Able to progress prone row with 5 lbs today with good form. Progressed R shoulder flexion, scaption, and abduction with increased weighted resistance with cues to slow speed on return to neutral. Patient with audible R shoulder popping with scaption, however reporting that it was nonpainful. Patient without complaints at end of session. Planning to wrap up with PT next session per patient's request.     Clinical Impairments Affecting Rehab Potential  vasovagal episode, TMJ dislocation, plantar fasciitis, HLD, GERD, fibromyalgia, chronic back pain, B trigger finger release, R TKA 2018, R mallet finger, R L3-4 laminectomy/decompression 2016, R knee arthroscopy x3, L morton's neuroma, L thumb CMC fusion 2015, B carpal tunnel release 2005    PT Treatment/Interventions  ADLs/Self Care Home Management;Cryotherapy;Electrical Stimulation;Iontophoresis 58m/ml Dexamethasone;Functional mobility training;Ultrasound;Moist Heat;Therapeutic activities;Therapeutic exercise;Neuromuscular re-education;Patient/family education;Passive range of motion;Scar mobilization;Manual  techniques;Dry needling;Energy conservation;Splinting;Taping;Vasopneumatic Device    PT Next Visit Plan  30 day hold/DC next session    Consulted and Agree with Plan of Care  Patient       Patient will benefit from skilled therapeutic intervention in order to improve the following deficits and impairments:  Decreased activity tolerance, Decreased strength, Increased fascial restricitons, Pain, Impaired UE functional use, Increased muscle spasms, Improper body mechanics, Decreased range of motion, Impaired flexibility, Postural dysfunction  Visit Diagnosis: Chronic right shoulder pain  Stiffness of right shoulder, not elsewhere  classified  Cervicalgia  Abnormal posture  Other symptoms and signs involving the musculoskeletal system     Problem List Patient Active Problem List   Diagnosis Date Noted  . History of food allergy 02/01/2018  . OA (osteoarthritis) of knee 09/06/2016  . Aortic atherosclerosis ([place]) 05/07/2016  . Recurrent urticaria 06/02/2015  . Allergic rhinitis with a probable nonallergic component 06/02/2015  . 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 Lumbar stenosis 02/11/2015  . Abdominal pain, chronic, right lower quadrant 01/07/2015  . Right hip pain 10/31/2014  . Vitamin D deficiency 10/31/2014  . Osteopenia 10/18/2014  . OSA (obstructive sleep apnea) 07/16/2014  . Migraines 07/16/2014  . TMJ (dislocation of temporomandibular joint) 07/16/2014  . HLD (hyperlipidemia) 03/22/2014  . S/P Nissen fundoplication May 9102 89/08/2838  . Dysphagia 01/24/2008  . DEPRESSION/ANXIETY 12/02/2007  . ESOPHAGEAL STRICTURE 12/02/2007  . GERD 12/02/2007  . Irritable bowel syndrome 12/02/2007  . Fibromyalgia 12/02/2007  . VASOVAGAL SYNCOPE 12/02/2007     Janene Harvey, PT, DPT 09/19/18 10:15 AM   [facility] [address]  [place], [place], [address] Phone: [phone]   Fax:  [phone]  Name: PAMALA HAYMAN MRN: [id number] Date of Birth: 1950-09-02

## 2018-09-21 ENCOUNTER — Ambulatory Visit: Payer: Medicare Other | Admitting: Physical Therapy

## 2018-09-21 ENCOUNTER — Encounter: Payer: Self-pay | Admitting: Physical Therapy

## 2018-09-21 ENCOUNTER — Other Ambulatory Visit: Payer: Self-pay

## 2018-09-21 DIAGNOSIS — M542 Cervicalgia: Secondary | ICD-10-CM | POA: Diagnosis not present

## 2018-09-21 DIAGNOSIS — G8929 Other chronic pain: Secondary | ICD-10-CM

## 2018-09-21 DIAGNOSIS — M25611 Stiffness of right shoulder, not elsewhere classified: Secondary | ICD-10-CM | POA: Diagnosis not present

## 2018-09-21 DIAGNOSIS — M25511 Pain in right shoulder: Secondary | ICD-10-CM | POA: Diagnosis not present

## 2018-09-21 DIAGNOSIS — R29898 Other symptoms and signs involving the musculoskeletal system: Secondary | ICD-10-CM | POA: Diagnosis not present

## 2018-09-21 DIAGNOSIS — R293 Abnormal posture: Secondary | ICD-10-CM

## 2018-09-21 NOTE — Therapy (Signed)
[facility] [address]  [place], [place], [address] Phone: [phone]   Fax:  [phone]  Physical Therapy Treatment  Patient Details  Name: Vanessa Oliver MRN: [id number] Date of Birth: 12-08-1950 Referring Provider (PT): Karlton Lemon, MD   Progress Note Reporting Period 09/19/18 to 09/27/18  See note below for Objective Data and Assessment of Progress/Goals.     Encounter Date: 09/21/2018  PT End of Session - 09/21/18 1607    Visit Number  11    Number of Visits  13    Date for PT Re-Evaluation  09/27/18    Authorization Type  Medicare & Tricare    PT Start Time  1529    PT Stop Time  1607    PT Time Calculation (min)  38 min    Activity Tolerance  Patient tolerated treatment well    Behavior During Therapy  WFL for tasks assessed/performed       Past Medical History:  Diagnosis Date  . Allergy    takes Singulair at night  . Anxiety   . Arthritis   . Cataract   . Chronic back pain    stenosis  . Depression    takes CYmbalta daily  . Esophageal stricture   . Family history of adverse reaction to anesthesia    oldest brother had trouble with anesthesia a long time ago but can't recall what  . Fibromyalgia   . Fibromyalgia   . GERD (gastroesophageal reflux disease)    takes Omeprazole daily  . Hemorrhoids   . History of bronchitis   . Hyperlipidemia   . IBS (irritable bowel syndrome)   . Joint pain   . Joint swelling   . Plantar fasciitis   . Rosacea   . Rosacea   . Sleep apnea    cpap- settings at 10 breathing   . TMJ (dislocation of temporomandibular joint)   . TMJ (temporomandibular joint disorder)   . Vasovagal episode back in the 80's  . Vasovagal syncope   . Weakness    right leg    Past Surgical History:  Procedure Laterality Date  . ABDOMINAL HYSTERECTOMY  1997  . CARPAL TUNNEL RELEASE Bilateral 2004  . CARPOMETACARPAL (CMC) FUSION OF THUMB  2015   thumb  .  CATARACT EXTRACTION Right 2013   Dr Ellison Hughs  . CATARACT EXTRACTION Left 04/2016  . COLONOSCOPY    . COLONOSCOPY WITH ESOPHAGOGASTRODUODENOSCOPY (EGD) AND ESOPHAGEAL DILATION (ED)    . EYE SURGERY     scar tissue removed after cataract surgery  . 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 KNEE ARTHROSCOPY Right 1994   x 3  . KNEE SURGERY Right 01/21/2006  . LUMBAR LAMINECTOMY/DECOMPRESSION MICRODISCECTOMY Right 02/11/2015   Procedure: Laminectomy and Foraminotomy - Right - L3-L4;  Surgeon: Earnie Larsson, MD;  Location: [facility];  Service: Neurosurgery;  Laterality: Right;  Laminectomy and Foraminotomy - Right - L3-L4  . mallet finger Right 2011  . NASAL SEPTUM SURGERY  1970  . NISSEN FUNDOPLICATION  9977  . RETINAL LASER PROCEDURE Right 02/01/2011   right eye   . TONSILLECTOMY  1981  . TOTAL KNEE ARTHROPLASTY Right 09/06/2016   Procedure: RIGHT TOTAL KNEE ARTHROPLASTY;  Surgeon: Gaynelle Arabian, MD;  Location: [facility];  Service: Orthopedics;  Laterality: Right;  . TRIGGER FINGER RELEASE Bilateral 2005  . UPPER GASTROINTESTINAL ENDOSCOPY    . VARICOSE VEIN SURGERY Bilateral 2007, 2009  left 2007, right 2009  . VITRECTOMY Right 2012    There were no vitals filed for this visit.  Subjective Assessment - 09/21/18 1528    Subjective  Reports that she had a bit of muscle soreness after last session, but this dissipated. Feeling good today. Reports 80% improvement since initial eval. Still having some tenderness with pressure.     Pertinent History  vasovagal episode, TMJ dislocation, plantar fasciitis, HLD, GERD, fibromyalgia, chronic back pain, B trigger finger release, R TKA 2018, R mallet finger, R L3-4 laminectomy/decompression 2016, R knee arthroscopy x3, L morton's neuroma, L thumb CMC fusion 2015, B carpal tunnel release 2005    Diagnostic tests  none recent    Patient Stated Goals  "that this would not hurt again and get back to my weights"    Currently in Pain?  Yes    Pain  Score  3     Pain Location  Shoulder    Pain Orientation  Right    Pain Descriptors / Indicators  Aching    Pain Type  Chronic pain         [facility] PT Assessment - 09/21/18 0001      Observation/Other Assessments   Focus on Therapeutic Outcomes (FOTO)   Shoulder: 69 (31% impaired, 40% predicted)      AROM   Right Shoulder Flexion  151 Degrees    Right Shoulder ABduction  171 Degrees    Right Shoulder Internal Rotation  --   FIR T9   Right Shoulder External Rotation  --   FER T3   Cervical Flexion  56    Cervical Extension  50    Cervical - Right Side Bend  35    Cervical - Left Side Bend  44   mild pain in rhomboids   Cervical - Right Rotation  WFL   mild L UT pain   Cervical - Left Rotation  mildly limited   mild L UT pain     Strength   Right Shoulder Flexion  4+/5    Right Shoulder ABduction  4/5    Right Shoulder Internal Rotation  4+/5    Right Shoulder External Rotation  4/5                   [facility] Adult PT Treatment/Exercise - 09/21/18 0001      Shoulder Exercises: Seated   External Rotation  Strengthening;Both;15 reps;Theraband    Theraband Level (Shoulder External Rotation)  Level 2 (Red)    External Rotation Limitations  B ER; good form    Other Seated Exercises  R shoulder abduction 2# x10 to tolerance   slight adjustment of angle to avoid clicking in shoulder   Other Seated Exercises  R shoulder scaption to tolerance with 3# x10   cues to avoid shoulder hiking     Shoulder Exercises: Standing   External Rotation  Strengthening;Theraband;Both;15 reps    Theraband Level (Shoulder External Rotation)  Level 2 (Red)    External Rotation Limitations  cues to stop at neutral    Internal Rotation  Strengthening;Right;Theraband;15 reps    Theraband Level (Shoulder Internal Rotation)  Level 2 (Red)    Internal Rotation Limitations  cues to stop at neutral      Shoulder Exercises: ROM/Strengthening   UBE (Upper Arm Bike)  L2.5 x 3 min forward/3 min  back      Shoulder Exercises: Stretch   Cross Chest Stretch  2 reps;30 seconds    Cross Chest  Stretch Limitations  R UE to tolerance             PT Education - 09/21/18 1607    Education Details  update and consolidation of HEP; discussion on objective progress with PT    Person(s) Educated  Patient    Methods  Explanation;Demonstration;Tactile cues;Verbal cues;Handout    Comprehension  Verbalized understanding;Returned demonstration       PT Short Term Goals - 09/21/18 1534      PT SHORT TERM GOAL #1   Title  pt independent with initial HEP by 03/28/15    Time  3    Period  Weeks    Status  Achieved    Target Date  09/06/18        PT Long Term Goals - 09/21/18 1534      PT LONG TERM GOAL #1   Title  Patient will be independent with advanced HEP.    Time  6    Period  Weeks    Status  Achieved      PT LONG TERM GOAL #2   Title  Patient to demonstrate R shoulder AROM non-painful and WFL.    Time  6    Period  Weeks    Status  Achieved   no pain with R shoulder AROM and ROM now not limiting her with functional activities     PT LONG TERM GOAL #3   Title  Patient to demonstrate cervical AROM WFL and without pain limiting.     Time  6    Period  Weeks    Status  Partially Met   improvements in cervical flexion and extension, most limited in sidebending and rotation     PT LONG TERM GOAL #4   Title  Patient to demonstrate R shoulder strength >=4+/5.    Time  6    Period  Weeks    Status  Partially Met   improvements in R shoulder flexion, most limited in abduction and ER     PT LONG TERM GOAL #5   Title  Patient to demonstrate and report increased postural awareness throughout the day.     Time  6    Period  Weeks    Status  Achieved      PT LONG TERM GOAL #6   Title  Patient to return to gym activities without pain limiting.     Time  6    Period  Weeks    Status  Partially Met   patient reports that she is busy, but plans to start working out in  her garage as she owns equipment           Plan - 09/21/18 1613    Clinical Impression Statement  Patient arrived to session with report of 80% improvement in R neck and shoulder since initial eval. Notes that she is still has some tenderness with palpation. Notes that she was able to vacuum yesterday without pain, which is an activity that was previously painful to her. Patient reporting no pain with R shoulder AROM, and ROM now is not limiting her with functional activities. Has shown improvements in cervical flexion and extension, most limited in sidebending and rotation at this time with report of slight pulling in UT. Strength testing revealed improvements in R shoulder flexion, most limited in abduction and ER. Updated and consolidated HEP with exercises to continue to address patient's deficits. Patient able to tolerate all ther-ex this session without pain. Patient has met  or partially met all goals at this time and is satisfied with CLOF. Placed on 30 day hold at this time.     Clinical Impairments Affecting Rehab Potential  vasovagal episode, TMJ dislocation, plantar fasciitis, HLD, GERD, fibromyalgia, chronic back pain, B trigger finger release, R TKA 2018, R mallet finger, R L3-4 laminectomy/decompression 2016, R knee arthroscopy x3, L morton's neuroma, L thumb CMC fusion 2015, B carpal tunnel release 2005    PT Treatment/Interventions  ADLs/Self Care Home Management;Cryotherapy;Electrical Stimulation;Iontophoresis 62m/ml Dexamethasone;Functional mobility training;Ultrasound;Moist Heat;Therapeutic activities;Therapeutic exercise;Neuromuscular re-education;Patient/family education;Passive range of motion;Scar mobilization;Manual techniques;Dry needling;Energy conservation;Splinting;Taping;Vasopneumatic Device    PT Next Visit Plan  30 day hold at this time    Consulted and Agree with Plan of Care  Patient       Patient will benefit from skilled therapeutic intervention in order to  improve the following deficits and impairments:  Decreased activity tolerance, Decreased strength, Increased fascial restricitons, Pain, Impaired UE functional use, Increased muscle spasms, Improper body mechanics, Decreased range of motion, Impaired flexibility, Postural dysfunction  Visit Diagnosis: Chronic right shoulder pain  Stiffness of right shoulder, not elsewhere classified  Cervicalgia  Abnormal posture  Other symptoms and signs involving the musculoskeletal system     Problem List Patient Active Problem List   Diagnosis Date Noted  . History of food allergy 02/01/2018  . OA (osteoarthritis) of knee 09/06/2016  . Aortic atherosclerosis ([place] 05/07/2016  . Recurrent urticaria 06/02/2015  . Allergic rhinitis with a probable nonallergic component 06/02/2015  . 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 Lumbar stenosis 02/11/2015  . Abdominal pain, chronic, right lower quadrant 01/07/2015  . Right hip pain 10/31/2014  . Vitamin D deficiency 10/31/2014  . Osteopenia 10/18/2014  . OSA (obstructive sleep apnea) 07/16/2014  . Migraines 07/16/2014  . TMJ (dislocation of temporomandibular joint) 07/16/2014  . HLD (hyperlipidemia) 03/22/2014  . S/P Nissen fundoplication May [id number] . Dysphagia 01/24/2008  . DEPRESSION/ANXIETY 12/02/2007  . ESOPHAGEAL STRICTURE 12/02/2007  . GERD 12/02/2007  . Irritable bowel syndrome 12/02/2007  . Fibromyalgia 12/02/2007  . VASOVAGAL SYNCOPE 12/02/2007     YJanene Harvey PT, DPT 09/21/18 4:15 PM   [facility] [address] [place] [address]Phone: [phone]  Fax:  [phone] Name: Vanessa STOFFELMRN: [id number] of Birth: [birth year]

## 2018-09-28 DIAGNOSIS — F4310 Post-traumatic stress disorder, unspecified: Secondary | ICD-10-CM | POA: Diagnosis not present

## 2018-10-05 ENCOUNTER — Ambulatory Visit: Payer: Medicare Other | Admitting: Physical Therapy

## 2018-10-05 ENCOUNTER — Encounter: Payer: Self-pay | Admitting: Physical Therapy

## 2018-10-05 ENCOUNTER — Other Ambulatory Visit: Payer: Self-pay

## 2018-10-05 DIAGNOSIS — G8929 Other chronic pain: Secondary | ICD-10-CM | POA: Diagnosis not present

## 2018-10-05 DIAGNOSIS — M542 Cervicalgia: Secondary | ICD-10-CM | POA: Diagnosis not present

## 2018-10-05 DIAGNOSIS — M25511 Pain in right shoulder: Secondary | ICD-10-CM | POA: Diagnosis not present

## 2018-10-05 DIAGNOSIS — R29898 Other symptoms and signs involving the musculoskeletal system: Secondary | ICD-10-CM | POA: Diagnosis not present

## 2018-10-05 DIAGNOSIS — M25611 Stiffness of right shoulder, not elsewhere classified: Secondary | ICD-10-CM | POA: Diagnosis not present

## 2018-10-05 DIAGNOSIS — R293 Abnormal posture: Secondary | ICD-10-CM | POA: Diagnosis not present

## 2018-10-05 NOTE — Therapy (Addendum)
[facility] [address]  [place], [place], [address] Phone: [phone]   Fax:  [phone]  Physical Therapy Treatment  Patient Details  Name: Vanessa Oliver MRN: [id number] Date of Birth: 04-01-1951 Referring Provider (PT): Karlton Lemon, MD   Progress Note Reporting Period 09/21/18 to 10/05/18  See note below for Objective Data and Assessment of Progress/Goals.    Encounter Date: 10/05/2018  PT End of Session - 10/05/18 1110    Visit Number  12    Number of Visits  16    Date for PT Re-Evaluation  11/02/18    Authorization Type  Medicare & Tricare    PT Start Time  0932    PT Stop Time  1010    PT Time Calculation (min)  38 min    Activity Tolerance  Patient tolerated treatment well;Patient limited by pain    Behavior During Therapy  [facility] for tasks assessed/performed       Past Medical History:  Diagnosis Date  . Allergy    takes Singulair at night  . Anxiety   . Arthritis   . Cataract   . Chronic back pain    stenosis  . Depression    takes CYmbalta daily  . Esophageal stricture   . Family history of adverse reaction to anesthesia    oldest brother had trouble with anesthesia a long time ago but can't recall what  . Fibromyalgia   . Fibromyalgia   . GERD (gastroesophageal reflux disease)    takes Omeprazole daily  . Hemorrhoids   . History of bronchitis   . Hyperlipidemia   . IBS (irritable bowel syndrome)   . Joint pain   . Joint swelling   . Plantar fasciitis   . Rosacea   . Rosacea   . Sleep apnea    cpap- settings at 10 breathing   . TMJ (dislocation of temporomandibular joint)   . TMJ (temporomandibular joint disorder)   . Vasovagal episode back in the 80's  . Vasovagal syncope   . Weakness    right leg    Past Surgical History:  Procedure Laterality Date  . ABDOMINAL HYSTERECTOMY  1997  . CARPAL TUNNEL RELEASE Bilateral 2004  . CARPOMETACARPAL (CMC) FUSION OF THUMB   2015   thumb  . CATARACT EXTRACTION Right 2013   Dr Ellison Hughs  . CATARACT EXTRACTION Left 04/2016  . COLONOSCOPY    . COLONOSCOPY WITH ESOPHAGOGASTRODUODENOSCOPY (EGD) AND ESOPHAGEAL DILATION (ED)    . EYE SURGERY     scar tissue removed after cataract surgery  . 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 KNEE ARTHROSCOPY Right 1994   x 3  . KNEE SURGERY Right 01/21/2006  . LUMBAR LAMINECTOMY/DECOMPRESSION MICRODISCECTOMY Right 02/11/2015   Procedure: Laminectomy and Foraminotomy - Right - L3-L4;  Surgeon: Earnie Larsson, MD;  Location: [facility];  Service: Neurosurgery;  Laterality: Right;  Laminectomy and Foraminotomy - Right - L3-L4  . mallet finger Right 2011  . NASAL SEPTUM SURGERY  1970  . NISSEN FUNDOPLICATION  3559  . RETINAL LASER PROCEDURE Right 02/01/2011   right eye   . TONSILLECTOMY  1981  . TOTAL KNEE ARTHROPLASTY Right 09/06/2016   Procedure: RIGHT TOTAL KNEE ARTHROPLASTY;  Surgeon: Gaynelle Arabian, MD;  Location: [facility];  Service: Orthopedics;  Laterality: Right;  . TRIGGER FINGER RELEASE Bilateral 2005  . UPPER GASTROINTESTINAL ENDOSCOPY    . VARICOSE VEIN SURGERY Bilateral 2007, 2009  left 2007, right 2009  . VITRECTOMY Right 2012    There were no vitals filed for this visit.  Subjective Assessment - 10/05/18 0934    Subjective  Reports that she has had a HA over R eye for about 10 days. Says that she has had allergies and also started using a perfume powder that she believes miht be contributing to it. Also has a spot in the back of her R shoulder that is bothering her.     Pertinent History  vasovagal episode, TMJ dislocation, plantar fasciitis, HLD, GERD, fibromyalgia, chronic back pain, B trigger finger release, R TKA 2018, R mallet finger, R L3-4 laminectomy/decompression 2016, R knee arthroscopy x3, L morton's neuroma, L thumb CMC fusion 2015, B carpal tunnel release 2005    Diagnostic tests  none recent    Patient Stated Goals  "that this would not hurt  again and get back to my weights"    Currently in Pain?  Yes    Pain Score  4     Pain Location  Shoulder    Pain Orientation  Right;Posterior    Pain Descriptors / Indicators  Aching    Pain Type  Chronic pain    Pain Score  4    Pain Location  Head    Pain Orientation  Right   over R eye   Pain Descriptors / Indicators  Aching;Dull    Pain Type  Acute pain         [facility] PT Assessment - 10/05/18 0001      Assessment   Medical Diagnosis  R shoulder pain    Referring Provider (PT)  Karlton Lemon, MD    Onset Date/Surgical Date  --   several years     AROM   Right Shoulder Flexion  158 Degrees    Right Shoulder ABduction  160 Degrees    Right Shoulder Internal Rotation  --   FIR T9   Right Shoulder External Rotation  --   FER T3   Cervical Flexion  55    Cervical Extension  48   pain in R posterior shoulder   Cervical - Right Side Bend  38    Cervical - Left Side Bend  40   pain in R posterior shoulder   Cervical - Right Rotation  mildly limited    Cervical - Left Rotation  [facility]      Strength   Right Shoulder Flexion  4+/5    Right Shoulder ABduction  4+/5    Right Shoulder Internal Rotation  4+/5   pain in R shoulder   Right Shoulder External Rotation  4/5   pain in R shoulder                  [facility] Adult PT Treatment/Exercise - 10/05/18 0001      Shoulder Exercises: Seated   External Rotation  Strengthening;Both;15 reps;Theraband    Theraband Level (Shoulder External Rotation)  Level 2 (Red)    External Rotation Limitations  B ER; good form    Abduction  Strengthening;Right;10 reps;Weights    ABduction Weight (lbs)  2    ABduction Limitations  cues for thumb up and decrease speed    Other Seated Exercises  R shoulder scaption to tolerance with 2# x10   cues for thumb up     Shoulder Exercises: Standing   External Rotation  Strengthening;Theraband;Both;15 reps    Theraband Level (Shoulder External Rotation)  Level 2 (Red)    External  Rotation  Limitations  good form      Shoulder Exercises: ROM/Strengthening   UBE (Upper Arm Bike)  L2.0 x 3 min forward/3 min back      Shoulder Exercises: Stretch   Corner Stretch  2 reps;30 seconds    Corner Stretch Limitations  90/90 in corner      Neck Exercises: Haematologist  Right;30 seconds;2 reps    Levator Stretch Limitations  with strap to tolerance   advised to use strap on shoulder rather than pulling head            PT Education - 10/05/18 1110    Education Details  update to HEP    Person(s) Educated  Patient    Methods  Explanation;Demonstration;Tactile cues;Verbal cues;Handout    Comprehension  Verbalized understanding;Returned demonstration       PT Short Term Goals - 10/05/18 1002      PT SHORT TERM GOAL #1   Title  pt independent with initial HEP by 03/28/15    Time  3    Period  Weeks    Status  Achieved    Target Date  09/06/18        PT Long Term Goals - 10/05/18 1002      PT LONG TERM GOAL #1   Title  Patient will be independent with advanced HEP.    Time  4    Period  Weeks    Status  Partially Met   patient has been performing current HEP incorrectly   Target Date  11/02/18      PT LONG TERM GOAL #2   Title  Patient to demonstrate R shoulder AROM non-painful and WFL.    Time  4    Period  Weeks    Status  Partially Met   decreased R shoulder abduction AROM   Target Date  11/02/18      PT LONG TERM GOAL #3   Title  Patient to demonstrate cervical AROM WFL and without pain limiting.     Time  4    Period  Weeks    Status  Partially Met   increased pain with cervical extension and L sidebending   Target Date  11/02/18      PT LONG TERM GOAL #4   Title  Patient to demonstrate R shoulder strength >=4+/5.    Time  4    Period  Weeks    Status  Partially Met   increased pain with shoulder IR and ER strength testing    Target Date  11/02/18      PT LONG TERM GOAL #5   Title  Patient to demonstrate and report increased  postural awareness throughout the day.     Time  6    Period  Weeks    Status  Achieved   reports good awareness throughout the day     PT LONG TERM GOAL #6   Title  Patient to return to gym activities without pain limiting.     Time  4    Period  Weeks    Status  Partially Met   reports that she has been walking more but has not done any gym workouts at home   Target Date  11/02/18            Plan - 10/05/18 1111    Clinical Impression Statement  Patient arrived to session with report of HA over R eye for about 10 days, but believes  that allergies, stress, and new perfume may have something to do with it. Also reporting spot in R posterior shoulder that has been bothering her. Reviewed HEP as patient with questions about this. Patient reporting that she has been performing her HEP as well as adding in some exercises that were not given to her. Patient requiring correction of form with weighted shoulder scaption and correction; given cues to maintain thumb up to avoid impingement pattern. Patient reported improvement in pain with this correction. Also reported doing levator stretch while pulling head down into OP- advised patient to use belt to avoid shoulder hiking rather than pulling on her head and cervical spine. Patient reported relief of pain with this correction. Patient has demonstrated decreased R shoulder abduction AROM, increased pain with cervical extension and L sidebending, and increased pain with shoulder IR and ER strength testing since being placed on 30 day hold. Patient would benefit from skilled PT services 1x/week for 4 weeks to address remaining impairments and return to PLOF.    Clinical Impairments Affecting Rehab Potential  vasovagal episode, TMJ dislocation, plantar fasciitis, HLD, GERD, fibromyalgia, chronic back pain, B trigger finger release, R TKA 2018, R mallet finger, R L3-4 laminectomy/decompression 2016, R knee arthroscopy x3, L morton's neuroma, L thumb CMC  fusion 2015, B carpal tunnel release 2005    PT Frequency  1x / week    PT Duration  4 weeks    PT Treatment/Interventions  ADLs/Self Care Home Management;Cryotherapy;Electrical Stimulation;Iontophoresis 80m/ml Dexamethasone;Functional mobility training;Ultrasound;Moist Heat;Therapeutic activities;Therapeutic exercise;Neuromuscular re-education;Patient/family education;Passive range of motion;Scar mobilization;Manual techniques;Dry needling;Energy conservation;Splinting;Taping;Vasopneumatic Device    PT Next Visit Plan  STM, periscapular and RTC strengthening     Consulted and Agree with Plan of Care  Patient       Patient will benefit from skilled therapeutic intervention in order to improve the following deficits and impairments:  Decreased activity tolerance, Decreased strength, Increased fascial restricitons, Pain, Impaired UE functional use, Increased muscle spasms, Improper body mechanics, Decreased range of motion, Impaired flexibility, Postural dysfunction  Visit Diagnosis: Chronic right shoulder pain  Stiffness of right shoulder, not elsewhere classified  Cervicalgia  Abnormal posture  Other symptoms and signs involving the musculoskeletal system     Problem List Patient Active Problem List   Diagnosis Date Noted  . History of food allergy 02/01/2018  . OA (osteoarthritis) of knee 09/06/2016  . Aortic atherosclerosis ([place] 05/07/2016  . Recurrent urticaria 06/02/2015  . Allergic rhinitis with a probable nonallergic component 06/02/2015  . 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 Lumbar stenosis 02/11/2015  . Abdominal pain, chronic, right lower quadrant 01/07/2015  . Right hip pain 10/31/2014  . Vitamin D deficiency 10/31/2014  . Osteopenia 10/18/2014  . OSA (obstructive sleep apnea) 07/16/2014  . Migraines 07/16/2014  . TMJ (dislocation of temporomandibular joint) 07/16/2014  . HLD (hyperlipidemia) 03/22/2014  . S/P Nissen  fundoplication May [id number] . Dysphagia 01/24/2008  . DEPRESSION/ANXIETY 12/02/2007  . ESOPHAGEAL STRICTURE 12/02/2007  . GERD 12/02/2007  . Irritable bowel syndrome 12/02/2007  . Fibromyalgia 12/02/2007  . VASOVAGAL SYNCOPE 12/02/2007     YJanene Harvey PT, DPT 10/05/18 11:21 AM   [facility] [place] [address]Phone: [phone]  Fax:  [phone] Name: Vanessa CAKEMRN: [id number] of Birth: [birth year] PHYSICAL THERAPY DISCHARGE SUMMARY  Visits from Start of Care: 12  Current functional level related to goals /  functional outcomes: See above- patient did not return d/t concern about COVID-19   Remaining deficits: See above   Education / Equipment: HEP  Plan: Patient agrees to discharge.  Patient goals were partially met. Patient is being discharged due to not returning since the last visit.  ?????     Janene Harvey, PT, DPT 11/07/18 1:40 PM

## 2018-10-12 ENCOUNTER — Ambulatory Visit: Payer: Medicare Other | Admitting: Physical Therapy

## 2018-10-16 ENCOUNTER — Other Ambulatory Visit: Payer: Self-pay | Admitting: Family

## 2018-10-29 ENCOUNTER — Encounter: Payer: Self-pay | Admitting: Family

## 2018-10-30 MED ORDER — PRAVASTATIN SODIUM 40 MG PO TABS: 40.0000 mg | ORAL_TABLET | Freq: Every day | ORAL | 1 refills | Status: DC

## 2018-11-16 ENCOUNTER — Telehealth: Payer: Self-pay

## 2018-11-16 NOTE — Telephone Encounter (Signed)
Copied from [place] [id number]. Topic: General - Other >> Nov 15, 2018  2:42 PM Yvette Rack wrote: Reason for CRM: Roderic Palau with [facility] called to check to see if the fax from Monday 11/13/18 was received. Roderic Palau requests call back. Cb# [phone]

## 2018-11-17 ENCOUNTER — Telehealth: Payer: Self-pay | Admitting: *Deleted

## 2018-11-17 NOTE — Telephone Encounter (Signed)
Called the number listed, per representative at [facility] the name Roderic Palau was not enough information to determine the reason for the call, she reviewed patient's information in their system and the only thing she is needing is CPAP orders. She will fax orders to our fax number.

## 2018-11-17 NOTE — Telephone Encounter (Signed)
Received Physician Orders/CPAP from [facility]; forwarded to provider/SLS 05/08

## 2018-11-21 ENCOUNTER — Ambulatory Visit: Payer: Medicare Other | Admitting: Family

## 2018-11-21 NOTE — Telephone Encounter (Signed)
edmund is calling and would like cpap order to be fax to [phone] asap. [facility] has changed to [facility].

## 2018-11-21 NOTE — Telephone Encounter (Signed)
Have you seen forms?

## 2018-12-15 ENCOUNTER — Encounter: Payer: Self-pay | Admitting: Family

## 2018-12-15 DIAGNOSIS — B351 Tinea unguium: Secondary | ICD-10-CM

## 2018-12-22 ENCOUNTER — Telehealth: Payer: Self-pay | Admitting: *Deleted

## 2018-12-22 NOTE — Telephone Encounter (Signed)
[facility] called- Dr. Verlin Fester had changed from ranitidine 150 to Famotidine 40 and pharmacy is stating that the famotidine 40 is unavailable and is wanting to know if we wanted to switch it to famotidine 20. Please advise.

## 2018-12-28 ENCOUNTER — Ambulatory Visit (INDEPENDENT_AMBULATORY_CARE_PROVIDER_SITE_OTHER): Payer: Medicare Other | Admitting: Podiatry

## 2018-12-28 ENCOUNTER — Other Ambulatory Visit: Payer: Self-pay

## 2018-12-28 ENCOUNTER — Encounter: Payer: Self-pay | Admitting: Podiatry

## 2018-12-28 ENCOUNTER — Encounter: Payer: Self-pay | Admitting: Family

## 2018-12-28 VITALS — BP 154/75 | HR 72 | Temp 98.0°F

## 2018-12-28 DIAGNOSIS — M79674 Pain in right toe(s): Secondary | ICD-10-CM

## 2018-12-28 DIAGNOSIS — B351 Tinea unguium: Secondary | ICD-10-CM | POA: Diagnosis not present

## 2018-12-28 DIAGNOSIS — F4310 Post-traumatic stress disorder, unspecified: Secondary | ICD-10-CM | POA: Diagnosis not present

## 2018-12-29 MED ORDER — DULOXETINE HCL 60 MG PO CPEP: 60.0000 mg | ORAL_CAPSULE | Freq: Every day | ORAL | 3 refills | Status: DC

## 2018-12-29 MED ORDER — ELDERBERRY 575 MG/5ML PO SYRP: 500.0000 mg | ORAL_SOLUTION | Freq: Two times a day (BID) | ORAL | Status: DC

## 2018-12-29 MED ORDER — TART CHERRY ADVANCED PO CAPS: ORAL_CAPSULE | ORAL | Status: DC

## 2019-01-01 ENCOUNTER — Telehealth: Payer: Self-pay | Admitting: Family

## 2019-01-01 ENCOUNTER — Other Ambulatory Visit: Payer: Self-pay

## 2019-01-01 ENCOUNTER — Ambulatory Visit (INDEPENDENT_AMBULATORY_CARE_PROVIDER_SITE_OTHER): Payer: Medicare Other | Admitting: Family

## 2019-01-01 DIAGNOSIS — F439 Reaction to severe stress, unspecified: Secondary | ICD-10-CM | POA: Diagnosis not present

## 2019-01-01 MED ORDER — FAMOTIDINE 20 MG PO TABS: 20.0000 mg | ORAL_TABLET | Freq: Two times a day (BID) | ORAL | 3 refills | Status: DC

## 2019-01-01 NOTE — Telephone Encounter (Signed)
Return in about 4 weeks (around 01/29/2019) for follow up visit.... CALLED LEFT MSG WITH HUSBAND TO CALLBACK

## 2019-01-01 NOTE — Telephone Encounter (Signed)
Yes, famotidine 20 bid. Thanks.

## 2019-01-01 NOTE — Progress Notes (Signed)
Virtual Visit via Video Note  I connected with Vanessa Oliver on 01/01/19 at  9:40 AM EDT by a video enabled telemedicine application and verified that I am speaking with the correct person using two identifiers.  Location: Patient: home Provider: home   I discussed the limitations of evaluation and management by telemedicine and the availability of in person appointments. The patient expressed understanding and agreed to proceed.  History of Present Illness:  Patient is a 68 yr old female who presents today to discuss depression. She is currently maintained on cymbalta for depression and Fibromyalgia symptoms. She continues to be the primary care giver for her mother who is elderly as well as her husband who's health has been deteriorating. She states that her husband is incontinent of stool and is unsteady on his feet and falling.    She states that she feels like she is really having trouble dealing with the constant care of the two of them.  States that she had her mother set up for an ALF but "my brother talked her out of it."  She states her younger brother is not interested in helping out and her older brother is not wanting to take her into his home "until he gets his kitchen done which he can't do for 2 more years."  States that the kitchen is currently usable.  She states feeling hopelessness and wishing "that I just wouldn't wake up."  However she denies any suicidal plan.  Depression screen [facility] 2/9 01/01/2019 05/23/2018 04/25/2018 12/28/2017 04/05/2017  Decreased Interest 3 0 0 1 0  Down, Depressed, Hopeless 3 0 1 2 0  PHQ - 2 Score 6 0 1 3 0  Altered sleeping 3 1 - 3 -  Tired, decreased energy 3 0 - 3 -  Change in appetite 3 0 - 1 -  Feeling bad or failure about yourself  3 0 - 1 -  Trouble concentrating 1 0 - 0 -  Moving slowly or fidgety/restless 3 0 - 1 -  Suicidal thoughts 3 0 - 0 -  PHQ-9 Score 25 1 - 12 -  Difficult doing work/chores Somewhat difficult Not difficult at  all - - -     Observations/Objective:   Gen: Awake, alert, no acute distress Resp: Breathing is even and non-labored Psych: calm/pleasant demeanor Neuro: Alert and Oriented x 3, + facial symmetry, speech is clear.   Assessment and Plan:  Situational stress- I told the patient that I think it would be most helpful for her if she was able to get additional help for her mother- either by transferring her to an ALF or by having her move in with her brother so that she can care for her husband.  She agrees that this would help her stress significantly and plans to talk with her mother and brother more about this. Will continue cymbalta.  She is advised to continue seeing her therapist and to call 911 if she develops active thoughts of hurting herself. She verbalizes understanding and agrees to plan. 15 minutes spent with pt today.  >50% of this time was spent counseling pt on her stress.   Follow Up Instructions:    I discussed the assessment and treatment plan with the patient. The patient was provided an opportunity to ask questions and all were answered. The patient agreed with the plan and demonstrated an understanding of the instructions.   The patient was advised to call back or seek an in-person evaluation if the symptoms  worsen or if the condition fails to improve as anticipated.  Nance Pear, NP

## 2019-01-02 ENCOUNTER — Ambulatory Visit: Payer: Medicare Other | Admitting: Family

## 2019-01-03 NOTE — Progress Notes (Signed)
Subjective:   Patient ID: Vanessa Oliver, female   DOB: 68 y.o.   MRN: [id number]   HPI 68 year old female presents to the office for concerns of right 2nd digit toenail thickening. She is asking for the nail to be trimmed. She does not want it removed.  Denies any redness or drainage from the toenail site.   Review of Systems  All other systems reviewed and are negative.  Past Medical History:  Diagnosis Date  . Allergy    takes Singulair at night  . Anxiety   . Arthritis   . Cataract   . Chronic back pain    stenosis  . Depression    takes CYmbalta daily  . Esophageal stricture   . Family history of adverse reaction to anesthesia    oldest brother had trouble with anesthesia a long time ago but can't recall what  . Fibromyalgia   . Fibromyalgia   . GERD (gastroesophageal reflux disease)    takes Omeprazole daily  . Hemorrhoids   . History of bronchitis   . Hyperlipidemia   . IBS (irritable bowel syndrome)   . Joint pain   . Joint swelling   . Plantar fasciitis   . Rosacea   . Rosacea   . Sleep apnea    cpap- settings at 10 breathing   . TMJ (dislocation of temporomandibular joint)   . TMJ (temporomandibular joint disorder)   . Vasovagal episode back in the 80's  . Vasovagal syncope   . Weakness    right leg    Past Surgical History:  Procedure Laterality Date  . ABDOMINAL HYSTERECTOMY  1997  . CARPAL TUNNEL RELEASE Bilateral 2004  . CARPOMETACARPAL (CMC) FUSION OF THUMB  2015   thumb  . CATARACT EXTRACTION Right 2013   Dr Ellison Hughs  . CATARACT EXTRACTION Left 04/2016  . COLONOSCOPY    . COLONOSCOPY WITH ESOPHAGOGASTRODUODENOSCOPY (EGD) AND ESOPHAGEAL DILATION (ED)    . EYE SURGERY     scar tissue removed after cataract surgery  . 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 KNEE ARTHROSCOPY Right 1994   x 3  . KNEE SURGERY Right 01/21/2006  . LUMBAR LAMINECTOMY/DECOMPRESSION MICRODISCECTOMY Right 02/11/2015   Procedure: Laminectomy and Foraminotomy  - Right - L3-L4;  Surgeon: Earnie Larsson, MD;  Location: [facility];  Service: Neurosurgery;  Laterality: Right;  Laminectomy and Foraminotomy - Right - L3-L4  . mallet finger Right 2011  . NASAL SEPTUM SURGERY  1970  . NISSEN FUNDOPLICATION  6712  . RETINAL LASER PROCEDURE Right 02/01/2011   right eye   . TONSILLECTOMY  1981  . TOTAL KNEE ARTHROPLASTY Right 09/06/2016   Procedure: RIGHT TOTAL KNEE ARTHROPLASTY;  Surgeon: Gaynelle Arabian, MD;  Location: [facility];  Service: Orthopedics;  Laterality: Right;  . TRIGGER FINGER RELEASE Bilateral 2005  . UPPER GASTROINTESTINAL ENDOSCOPY    . VARICOSE VEIN SURGERY Bilateral 2007, 2009   left 2007, right 2009  . VITRECTOMY Right 2012     Current Outpatient Medications:  .  Azelaic Acid 15 % cream, Apply twice daily to clean dry face, Disp: 150 g, Rfl: 1 .  CALCIUM CITRATE PO, Take 1 tablet by mouth daily., Disp: , Rfl:  .  cholecalciferol (VITAMIN D) 1000 units tablet, Take 1 tablet (1,000 Units total) by mouth 2 (two) times daily., Disp: , Rfl:  .  Coenzyme Q10 200 MG capsule, Take 200 mg by mouth daily., Disp: , Rfl:  .  diclofenac sodium (  VOLTAREN) 1 % GEL, APPLY 2 GRAMS TOPICALLY TWICE A DAY AS NEEDED, Disp: 300 g, Rfl: 0 .  EPINEPHrine (EPIPEN 2-PAK) 0.3 mg/0.3 mL IJ SOAJ injection, Inject 0.3 mLs (0.3 mg total) into the muscle once., Disp: 1 Device, Rfl: 1 .  famotidine (PEPCID) 20 MG tablet, Take 20 mg by mouth daily., Disp: , Rfl:  .  fexofenadine (ALLEGRA) 180 MG tablet, Take 1 tablet (180 mg total) by mouth daily as needed for allergies or rhinitis., Disp: 90 tablet, Rfl: 3 .  FIBER PO, Take 2 capsules by mouth 2 (two) times daily., Disp: , Rfl:  .  fluticasone (FLONASE) 50 MCG/ACT nasal spray, Place 1 spray into both nostrils daily., Disp: 48 g, Rfl: 1 .  loratadine (CLARITIN) 10 MG tablet, Take 10 mg by mouth daily as needed for allergies., Disp: , Rfl:  .  Multiple Vitamin (MULTIVITAMIN) tablet, Take 1 tablet by mouth daily., Disp: , Rfl:   .  omeprazole (PRILOSEC) 40 MG capsule, TAKE 1 CAPSULE DAILY, Disp: 90 capsule, Rfl: 3 .  pravastatin (PRAVACHOL) 40 MG tablet, Take 1 tablet (40 mg total) by mouth daily., Disp: 90 tablet, Rfl: 1 .  Probiotic Product (PROBIOTIC DAILY PO), Take 1 capsule by mouth daily., Disp: , Rfl:  .  ranitidine (ZANTAC) 150 MG capsule, , Disp: , Rfl:  .  vitamin C (ASCORBIC ACID) 500 MG tablet, Take 500 mg by mouth daily., Disp: , Rfl:  .  DULoxetine (CYMBALTA) 60 MG capsule, Take 1 capsule (60 mg total) by mouth daily., Disp:  , Rfl: 3 .  Elderberry 575 MG/5ML SYRP, Take 500 mg by mouth 2 (two) times a day., Disp: , Rfl:  .  famotidine (PEPCID) 20 MG tablet, Take 1 tablet (20 mg total) by mouth 2 (two) times daily., Disp: 90 tablet, Rfl: 3 .  Misc Natural Products (TART CHERRY ADVANCED) CAPS, 1200mg  by mouth twice daily, Disp:  , Rfl:   Allergies  Allergen Reactions  . Aleve [Naproxen Sodium]     Swelling, itching  . Cefuroxime Axetil     Hives / "throat swelling"  . Chlorzoxazone Rash and Other (See Comments)    "breathing problems"   . Oxycodone     Hallucinations, rash  . Penicillins Anaphylaxis and Hives    Has patient had a PCN reaction causing immediate rash, facial/tongue/throat swelling, SOB or lightheadedness with hypotension: Yes Has patient had a PCN reaction causing severe rash involving mucus membranes or skin necrosis: No Has patient had a PCN reaction that required hospitalization No Has patient had a PCN reaction occurring within the last 10 years: No If all of the above answers are "NO", then may proceed with Cephalosporin use.   . Adhesive [Tape]     rash  . Cataflam [Diclofenac Potassium]     Lip swelling  . Clarithromycin Diarrhea  . Flagyl [Metronidazole Hcl]     ?diarrhea  . Fluzone [Flu Virus Vaccine]     Had local redness with high dose.   . Glucosamine Hives  . Guaifenesin & Derivatives     Stomach upset  . Hydrocodone-Acetaminophen     REACTION: rash,  tightening of throat  . Ketorolac Tromethamine     ?reaction  . Pentazocine     Involuntary twitching  . Pneumovax [Pneumococcal Polysaccharide Vaccine] Other (See Comments)    Redness/swelling at site, nausea  . Prevnar [Pneumococcal 13-Val Conj Vacc] Other (See Comments)    Swelling, redness, nausea  . Smz-Tmp Ds [Sulfamethoxazole W/Trimethoprim (Co-Trimoxazole)]     ?  unknown reaction  . Tramadol     constipation  . Trazodone And Nefazodone Other (See Comments)    Scratchy throat / congestion  . Latex Rash         Objective:  Physical Exam  General: AAO x3, NAD  Dermatological: The right second digit toenails hypertrophic, dystrophic with yellow-brown discoloration.  There is no pain in the nail today however does become painful when it comes inside shoes.  There is no surrounding redness or drainage or any other signs of infection.   Vascular: Dorsalis Pedis artery and Posterior Tibial artery pedal pulses are 2/4 bilateral with immedate capillary fill time.There is no pain with calf compression, swelling, warmth, erythema.   Neruologic: Grossly intact via light touch bilateral.   Musculoskeletal: No gross boney pedal deformities bilateral. No pain, crepitus, or limitation noted with foot and ankle range of motion bilateral. Muscular strength 5/5 in all groups tested bilateral.  Gait: Unassisted, Nonantalgic.       Assessment:   Right second digit onychodystrophy, onychomycosis     Plan:  -Treatment options discussed including all alternatives, risks, and complications -Etiology of symptoms were discussed -Debride the right second digit toenail with any complications or bleeding.  Chose to hold off on removal.  Trula Slade DPM

## 2019-01-18 DIAGNOSIS — F4310 Post-traumatic stress disorder, unspecified: Secondary | ICD-10-CM | POA: Diagnosis not present

## 2019-01-29 DIAGNOSIS — F4310 Post-traumatic stress disorder, unspecified: Secondary | ICD-10-CM | POA: Diagnosis not present

## 2019-02-06 ENCOUNTER — Encounter: Payer: Self-pay | Admitting: Family

## 2019-02-06 ENCOUNTER — Ambulatory Visit (INDEPENDENT_AMBULATORY_CARE_PROVIDER_SITE_OTHER): Payer: Medicare Other | Admitting: Family

## 2019-02-06 ENCOUNTER — Other Ambulatory Visit: Payer: Self-pay

## 2019-02-06 VITALS — BP 120/70 | HR 76 | Temp 98.6°F | Resp 16 | Wt 166.0 lb

## 2019-02-06 DIAGNOSIS — F439 Reaction to severe stress, unspecified: Secondary | ICD-10-CM

## 2019-02-06 MED ORDER — CLOTRIMAZOLE-BETAMETHASONE 1-0.05 % EX CREA: 1.0000 "application " | TOPICAL_CREAM | Freq: Two times a day (BID) | CUTANEOUS | 0 refills | Status: DC | PRN

## 2019-02-06 MED ORDER — CYCLOBENZAPRINE HCL 10 MG PO TABS: 5.0000 mg | ORAL_TABLET | Freq: Every day | ORAL | 0 refills | Status: DC | PRN

## 2019-02-06 MED ORDER — DULOXETINE HCL 60 MG PO CPEP: 60.0000 mg | ORAL_CAPSULE | Freq: Every day | ORAL | 1 refills | Status: DC

## 2019-02-06 MED ORDER — DICLOFENAC SODIUM 1 % TD GEL: TRANSDERMAL | 1 refills | Status: DC

## 2019-02-06 NOTE — Progress Notes (Signed)
Subjective:    Patient ID: Vanessa Oliver, female    DOB: [birth year], 67 y.o.   MRN: [id number]  HPI  Patient is a 68 yr old female who presents today for follow up.  Last visit we discussed some increased stressors that she was experiencing at home.  Her primary stressors were caring for her husband who has multiple health issues as well as caring for her elderly mother who lives with her.  We discussed trying to get some additional family help with her brother who had a one-point agreed to let the patient lives with her him.  We also discussed the possibility of an assisted living facility.  Husband has been sick and was hospitalized x 5 days.    Neighbor friend passed last week and she sad about this. She is working with Laurance Flatten, therapist since September.  She is finding this helpful.  She reports overall she is doing better and dealing with the stress.  Her brother has proven not to be helpful and has declined to help in the care of her mother.  However she feels like she has a better mental attitude about the situation currently.     Review of Systems    see HPI  Past Medical History:  Diagnosis Date  . Allergy    takes Singulair at night  . Anxiety   . Arthritis   . Cataract   . Chronic back pain    stenosis  . Depression    takes CYmbalta daily  . Esophageal stricture   . Family history of adverse reaction to anesthesia    oldest brother had trouble with anesthesia a long time ago but can't recall what  . Fibromyalgia   . Fibromyalgia   . GERD (gastroesophageal reflux disease)    takes Omeprazole daily  . Hemorrhoids   . History of bronchitis   . Hyperlipidemia   . IBS (irritable bowel syndrome)   . Joint pain   . Joint swelling   . Plantar fasciitis   . Rosacea   . Rosacea   . Sleep apnea    cpap- settings at 10 breathing   . TMJ (dislocation of temporomandibular joint)   . TMJ (temporomandibular joint disorder)   . Vasovagal episode back in the  80's  . Vasovagal syncope   . Weakness    right leg     Social History   Socioeconomic History  . Marital status: Married    Spouse name: Not on file  . Number of children: 0  . Years of education: Not on file  . Highest education level: Not on file  Occupational History  . Occupation: Hydrologist: [employer]  . Financial resource strain: Not on file  . Food insecurity    Worry: Not on file    Inability: Not on file  . Transportation needs    Medical: Not on file    Non-medical: Not on file  Tobacco Use  . Smoking status: Never Smoker  . Smokeless tobacco: Never Used  Substance and Sexual Activity  . Alcohol use: Yes    Alcohol/week: 0.0 standard drinks    Comment: rarely  . Drug use: No  . Sexual activity: Never    Birth control/protection: Surgical  Lifestyle  . Physical activity    Days per week: Not on file    Minutes per session: Not on file  . Stress: Not on file  Relationships  . Social connections  Talks on phone: Not on file    Gets together: Not on file    Attends religious service: Not on file    Active member of club or organization: Not on file    Attends meetings of clubs or organizations: Not on file    Relationship status: Not on file  . Intimate partner violence    Fear of current or ex partner: Not on file    Emotionally abused: Not on file    Physically abused: Not on file    Forced sexual activity: Not on file  Other Topics Concern  . Not on file  Social History Narrative   Works as an Glass blower/designer   Married- husband is 47 years older than her   Former Dance movement psychotherapist up up [employer]   Has cats   Enjoys reading   No children    Past Surgical History:  Procedure Laterality Date  . ABDOMINAL HYSTERECTOMY  1997  . CARPAL TUNNEL RELEASE Bilateral 2004  . CARPOMETACARPAL (CMC) FUSION OF THUMB  2015   thumb  . CATARACT EXTRACTION Right 2013   Dr Ellison Hughs  . CATARACT EXTRACTION Left 04/2016  . COLONOSCOPY     . COLONOSCOPY WITH ESOPHAGOGASTRODUODENOSCOPY (EGD) AND ESOPHAGEAL DILATION (ED)    . EYE SURGERY     scar tissue removed after cataract surgery  . 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 KNEE ARTHROSCOPY Right 1994   x 3  . KNEE SURGERY Right 01/21/2006  . LUMBAR LAMINECTOMY/DECOMPRESSION MICRODISCECTOMY Right 02/11/2015   Procedure: Laminectomy and Foraminotomy - Right - L3-L4;  Surgeon: Earnie Larsson, MD;  Location: [facility];  Service: Neurosurgery;  Laterality: Right;  Laminectomy and Foraminotomy - Right - L3-L4  . mallet finger Right 2011  . NASAL SEPTUM SURGERY  1970  . NISSEN FUNDOPLICATION  1448  . RETINAL LASER PROCEDURE Right 02/01/2011   right eye   . TONSILLECTOMY  1981  . TOTAL KNEE ARTHROPLASTY Right 09/06/2016   Procedure: RIGHT TOTAL KNEE ARTHROPLASTY;  Surgeon: Gaynelle Arabian, MD;  Location: [facility];  Service: Orthopedics;  Laterality: Right;  . TRIGGER FINGER RELEASE Bilateral 2005  . UPPER GASTROINTESTINAL ENDOSCOPY    . VARICOSE VEIN SURGERY Bilateral 2007, 2009   left 2007, right 2009  . VITRECTOMY Right 2012    Family History  Problem Relation Age of Onset  . Breast cancer Mother   . Arthritis Mother   . Hyperlipidemia Mother   . Hypertension Mother   . Allergic rhinitis Mother   . Urticaria Mother   . Lung cancer Father   . Pancreatic cancer Father   . Skin cancer Father   . Arthritis Father   . Allergic rhinitis Father   . Emphysema Father   . Lymphoma Brother   . Celiac disease Brother   . Hypertension Brother   . Thyroid disease Brother   . Allergic rhinitis Brother   . Hyperlipidemia Maternal Grandfather   . Heart disease Maternal Grandfather   . Stroke Maternal Grandfather   . Eczema Maternal Aunt   . Asthma Neg Hx   . Immunodeficiency Neg Hx   . Angioedema Neg Hx   . Colon cancer Neg Hx   . Colon polyps Neg Hx   . Esophageal cancer Neg Hx   . Rectal cancer Neg Hx   . Stomach cancer Neg Hx     Allergies  Allergen Reactions   . Aleve [Naproxen Sodium]     Swelling, itching  .  Cefuroxime Axetil     Hives / "throat swelling"  . Chlorzoxazone Rash and Other (See Comments)    "breathing problems"   . Oxycodone     Hallucinations, rash  . Penicillins Anaphylaxis and Hives    Has patient had a PCN reaction causing immediate rash, facial/tongue/throat swelling, SOB or lightheadedness with hypotension: Yes Has patient had a PCN reaction causing severe rash involving mucus membranes or skin necrosis: No Has patient had a PCN reaction that required hospitalization No Has patient had a PCN reaction occurring within the last 10 years: No If all of the above answers are "NO", then may proceed with Cephalosporin use.   . Adhesive [Tape]     rash  . Cataflam [Diclofenac Potassium]     Lip swelling  . Clarithromycin Diarrhea  . Flagyl [Metronidazole Hcl]     ?diarrhea  . Fluzone [Flu Virus Vaccine]     Had local redness with high dose.   . Glucosamine Hives  . Guaifenesin & Derivatives     Stomach upset  . Hydrocodone-Acetaminophen     REACTION: rash, tightening of throat  . Ketorolac Tromethamine     ?reaction  . Pentazocine     Involuntary twitching  . Pneumovax [Pneumococcal Polysaccharide Vaccine] Other (See Comments)    Redness/swelling at site, nausea  . Prevnar [Pneumococcal 13-Val Conj Vacc] Other (See Comments)    Swelling, redness, nausea  . Smz-Tmp Ds [Sulfamethoxazole W/Trimethoprim (Co-Trimoxazole)]     ?unknown reaction  . Tramadol     constipation  . Trazodone And Nefazodone Other (See Comments)    Scratchy throat / congestion  . Latex Rash    Current Outpatient Medications on File Prior to Visit  Medication Sig Dispense Refill  . Azelaic Acid 15 % cream Apply twice daily to clean dry face 150 g 1  . CALCIUM CITRATE PO Take 1 tablet by mouth daily.    . cholecalciferol (VITAMIN D) 1000 units tablet Take 1 tablet (1,000 Units total) by mouth 2 (two) times daily.    . Coenzyme Q10 200 MG  capsule Take 200 mg by mouth daily.    Kendall Flack 575 MG/5ML SYRP Take 500 mg by mouth 2 (two) times a day.    Marland Kitchen EPINEPHrine (EPIPEN 2-PAK) 0.3 mg/0.3 mL IJ SOAJ injection Inject 0.3 mLs (0.3 mg total) into the muscle once. 1 Device 1  . famotidine (PEPCID) 20 MG tablet Take 1 tablet (20 mg total) by mouth 2 (two) times daily. 90 tablet 3  . fexofenadine (ALLEGRA) 180 MG tablet Take 1 tablet (180 mg total) by mouth daily as needed for allergies or rhinitis. 90 tablet 3  . FIBER PO Take 2 capsules by mouth 2 (two) times daily.    . fluticasone (FLONASE) 50 MCG/ACT nasal spray Place 1 spray into both nostrils daily. 48 g 1  . Misc Natural Products (TART CHERRY ADVANCED) CAPS 1200mg  by mouth twice daily    . Multiple Vitamin (MULTIVITAMIN) tablet Take 1 tablet by mouth daily.    Marland Kitchen omeprazole (PRILOSEC) 40 MG capsule TAKE 1 CAPSULE DAILY 90 capsule 3  . pravastatin (PRAVACHOL) 40 MG tablet Take 1 tablet (40 mg total) by mouth daily. 90 tablet 1  . ranitidine (ZANTAC) 150 MG capsule     . vitamin C (ASCORBIC ACID) 500 MG tablet Take 500 mg by mouth daily.    . famotidine (PEPCID) 20 MG tablet Take 20 mg by mouth daily.    Marland Kitchen loratadine (CLARITIN) 10 MG tablet Take  10 mg by mouth daily as needed for allergies.    . Probiotic Product (PROBIOTIC DAILY PO) Take 1 capsule by mouth daily.     No current facility-administered medications on file prior to visit.     BP 120/70   Pulse 76   Temp 98.6 F (37 C) (Oral)   Resp 16   Wt 166 lb (75.3 kg)   LMP 05/14/1996   SpO2 100%   BMI 28.49 kg/m    Objective:   Physical Exam Constitutional:      Appearance: Normal appearance.  HENT:     Head: Normocephalic and atraumatic.  Pulmonary:     Effort: Pulmonary effort is normal.  Skin:    General: Skin is warm and dry.  Neurological:     General: No focal deficit present.     Mental Status: She is alert and oriented to person, place, and time.           Assessment & Plan:  Situational  stress- overall improved.  She continues Cymbalta.  I advised her to continue her work with her therapist that she is seeming to find this helpful.  We will continue to monitor.   A total of 15  minutes were spent face-to-face with the patient during this encounter and over half of that time was spent on counseling in regards to her home stress and coordination of care.

## 2019-02-15 DIAGNOSIS — F4310 Post-traumatic stress disorder, unspecified: Secondary | ICD-10-CM | POA: Diagnosis not present

## 2019-02-15 DIAGNOSIS — D239 Other benign neoplasm of skin, unspecified: Secondary | ICD-10-CM | POA: Diagnosis not present

## 2019-02-15 DIAGNOSIS — L700 Acne vulgaris: Secondary | ICD-10-CM | POA: Diagnosis not present

## 2019-02-15 DIAGNOSIS — L821 Other seborrheic keratosis: Secondary | ICD-10-CM | POA: Diagnosis not present

## 2019-02-19 DIAGNOSIS — M79641 Pain in right hand: Secondary | ICD-10-CM | POA: Diagnosis not present

## 2019-02-19 DIAGNOSIS — M1812 Unilateral primary osteoarthritis of first carpometacarpal joint, left hand: Secondary | ICD-10-CM | POA: Diagnosis not present

## 2019-03-01 DIAGNOSIS — F4310 Post-traumatic stress disorder, unspecified: Secondary | ICD-10-CM | POA: Diagnosis not present

## 2019-03-12 ENCOUNTER — Other Ambulatory Visit: Payer: Self-pay

## 2019-03-14 ENCOUNTER — Ambulatory Visit: Payer: Medicare Other | Admitting: Family

## 2019-03-15 DIAGNOSIS — F4310 Post-traumatic stress disorder, unspecified: Secondary | ICD-10-CM | POA: Diagnosis not present

## 2019-03-16 ENCOUNTER — Ambulatory Visit (INDEPENDENT_AMBULATORY_CARE_PROVIDER_SITE_OTHER): Payer: Medicare Other | Admitting: Family

## 2019-03-16 ENCOUNTER — Other Ambulatory Visit: Payer: Self-pay

## 2019-03-16 DIAGNOSIS — Z23 Encounter for immunization: Secondary | ICD-10-CM | POA: Diagnosis not present

## 2019-03-16 DIAGNOSIS — L299 Pruritus, unspecified: Secondary | ICD-10-CM

## 2019-03-16 NOTE — Patient Instructions (Addendum)
Take a tablet of benadryl today when you get home. Call if you develop redness/swelling of your arm following today's flu shot.   Go to the ER if you develop tongue/lip swelling or shortness of breath. Try switching you earplugs from wax to foam. For ear canal itching you can apply OTC hydrocortisone cream gently to ear canal with the tip of your finger.

## 2019-03-16 NOTE — Progress Notes (Signed)
Subjective:    Patient ID: Vanessa Oliver, female    DOB: [birth year], 68 y.o.   MRN: [id number]  HPI   Patient is a 68 year old female who presents today with 2 concerns.  Bilateral ear itching-she reports that she wears wax earplugs at night to help her sleep because her husband CPAP is loud.  Wonders if the earplugs may be causing her ears irritation.  Flu shot-she reports that she has never had issues with a flu shot until last year when she received a high-dose flu shot.  She reports that she had left arm redness and swelling.  She did not have shortness of breath tongue or lip swelling. She denies hx of egg allergy.   Review of Systems Past Medical History:  Diagnosis Date  . Allergy    takes Singulair at night  . Anxiety   . Arthritis   . Cataract   . Chronic back pain    stenosis  . Depression    takes CYmbalta daily  . Esophageal stricture   . Family history of adverse reaction to anesthesia    oldest brother had trouble with anesthesia a long time ago but can't recall what  . Fibromyalgia   . Fibromyalgia   . GERD (gastroesophageal reflux disease)    takes Omeprazole daily  . Hemorrhoids   . History of bronchitis   . Hyperlipidemia   . IBS (irritable bowel syndrome)   . Joint pain   . Joint swelling   . Plantar fasciitis   . Rosacea   . Rosacea   . Sleep apnea    cpap- settings at 10 breathing   . TMJ (dislocation of temporomandibular joint)   . TMJ (temporomandibular joint disorder)   . Vasovagal episode back in the 80's  . Vasovagal syncope   . Weakness    right leg     Social History   Socioeconomic History  . Marital status: Married    Spouse name: Not on file  . Number of children: 0  . Years of education: Not on file  . Highest education level: Not on file  Occupational History  . Occupation: Hydrologist: [employer]  . Financial resource strain: Not on file  . Food insecurity    Worry: Not on file   Inability: Not on file  . Transportation needs    Medical: Not on file    Non-medical: Not on file  Tobacco Use  . Smoking status: Never Smoker  . Smokeless tobacco: Never Used  Substance and Sexual Activity  . Alcohol use: Yes    Alcohol/week: 0.0 standard drinks    Comment: rarely  . Drug use: No  . Sexual activity: Never    Birth control/protection: Surgical  Lifestyle  . Physical activity    Days per week: Not on file    Minutes per session: Not on file  . Stress: Not on file  Relationships  . Social Herbalist on phone: Not on file    Gets together: Not on file    Attends religious service: Not on file    Active member of club or organization: Not on file    Attends meetings of clubs or organizations: Not on file    Relationship status: Not on file  . Intimate partner violence    Fear of current or ex partner: Not on file    Emotionally abused: Not on file    Physically abused: Not  on file    Forced sexual activity: Not on file  Other Topics Concern  . Not on file  Social History Narrative   Works as an Glass blower/designer   Married- husband is 24 years older than her   Former Dance movement psychotherapist up up [employer]   Has cats   Enjoys reading   No children    Past Surgical History:  Procedure Laterality Date  . ABDOMINAL HYSTERECTOMY  1997  . CARPAL TUNNEL RELEASE Bilateral 2004  . CARPOMETACARPAL (CMC) FUSION OF THUMB  2015   thumb  . CATARACT EXTRACTION Right 2013   Dr Ellison Hughs  . CATARACT EXTRACTION Left 04/2016  . COLONOSCOPY    . COLONOSCOPY WITH ESOPHAGOGASTRODUODENOSCOPY (EGD) AND ESOPHAGEAL DILATION (ED)    . EYE SURGERY     scar tissue removed after cataract surgery  . 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 KNEE ARTHROSCOPY Right 1994   x 3  . KNEE SURGERY Right 01/21/2006  . LUMBAR LAMINECTOMY/DECOMPRESSION MICRODISCECTOMY Right 02/11/2015   Procedure: Laminectomy and Foraminotomy - Right - L3-L4;  Surgeon: Earnie Larsson, MD;  Location: [facility];  Service: Neurosurgery;  Laterality: Right;  Laminectomy and Foraminotomy - Right - L3-L4  . mallet finger Right 2011  . NASAL SEPTUM SURGERY  1970  . NISSEN FUNDOPLICATION  0000000  . RETINAL LASER PROCEDURE Right 02/01/2011   right eye   . TONSILLECTOMY  1981  . TOTAL KNEE ARTHROPLASTY Right 09/06/2016   Procedure: RIGHT TOTAL KNEE ARTHROPLASTY;  Surgeon: Gaynelle Arabian, MD;  Location: [facility];  Service: Orthopedics;  Laterality: Right;  . TRIGGER FINGER RELEASE Bilateral 2005  . UPPER GASTROINTESTINAL ENDOSCOPY    . VARICOSE VEIN SURGERY Bilateral 2007, 2009   left 2007, right 2009  . VITRECTOMY Right 2012    Family History  Problem Relation Age of Onset  . Breast cancer Mother   . Arthritis Mother   . Hyperlipidemia Mother   . Hypertension Mother   . Allergic rhinitis Mother   . Urticaria Mother   . Lung cancer Father   . Pancreatic cancer Father   . Skin cancer Father   . Arthritis Father   . Allergic rhinitis Father   . Emphysema Father   . Lymphoma Brother   . Celiac disease Brother   . Hypertension Brother   . Thyroid disease Brother   . Allergic rhinitis Brother   . Hyperlipidemia Maternal Grandfather   . Heart disease Maternal Grandfather   . Stroke Maternal Grandfather   . Eczema Maternal Aunt   . Asthma Neg Hx   . Immunodeficiency Neg Hx   . Angioedema Neg Hx   . Colon cancer Neg Hx   . Colon polyps Neg Hx   . Esophageal cancer Neg Hx   . Rectal cancer Neg Hx   . Stomach cancer Neg Hx     Allergies  Allergen Reactions  . Aleve [Naproxen Sodium]     Swelling, itching  . Cefuroxime Axetil     Hives / "throat swelling"  . Chlorzoxazone Rash and Other (See Comments)    "breathing problems"   . Oxycodone     Hallucinations, rash  . Penicillins Anaphylaxis and Hives    Has patient had a PCN reaction causing immediate rash, facial/tongue/throat swelling, SOB or lightheadedness with hypotension: Yes Has patient had a PCN reaction causing severe  rash involving mucus membranes or skin necrosis: No Has patient had a PCN reaction that required hospitalization  No Has patient had a PCN reaction occurring within the last 10 years: No If all of the above answers are "NO", then may proceed with Cephalosporin use.   . Adhesive [Tape]     rash  . Cataflam [Diclofenac Potassium]     Lip swelling  . Clarithromycin Diarrhea  . Flagyl [Metronidazole Hcl]     ?diarrhea  . Fluzone [Flu Virus Vaccine]     Had local redness with high dose.   . Glucosamine Hives  . Guaifenesin & Derivatives     Stomach upset  . Hydrocodone-Acetaminophen     REACTION: rash, tightening of throat  . Ketorolac Tromethamine     ?reaction  . Pentazocine     Involuntary twitching  . Pneumovax [Pneumococcal Polysaccharide Vaccine] Other (See Comments)    Redness/swelling at site, nausea  . Prevnar [Pneumococcal 13-Val Conj Vacc] Other (See Comments)    Swelling, redness, nausea  . Smz-Tmp Ds [Sulfamethoxazole W/Trimethoprim (Co-Trimoxazole)]     ?unknown reaction  . Tramadol     constipation  . Trazodone And Nefazodone Other (See Comments)    Scratchy throat / congestion  . Latex Rash    Current Outpatient Medications on File Prior to Visit  Medication Sig Dispense Refill  . Azelaic Acid 15 % cream Apply twice daily to clean dry face 150 g 1  . CALCIUM CITRATE PO Take 1 tablet by mouth daily.    . cholecalciferol (VITAMIN D) 1000 units tablet Take 1 tablet (1,000 Units total) by mouth 2 (two) times daily.    . clotrimazole-betamethasone (LOTRISONE) cream Apply 1 application topically 2 (two) times daily as needed. 30 g 0  . Coenzyme Q10 200 MG capsule Take 200 mg by mouth daily.    . cyclobenzaprine (FLEXERIL) 10 MG tablet Take 0.5 tablets (5 mg total) by mouth daily as needed for muscle spasms. 30 tablet 0  . diclofenac sodium (VOLTAREN) 1 % GEL APPLY 2 GRAMS TOPICALLY TWICE A DAY AS NEEDED 300 g 1  . DULoxetine (CYMBALTA) 60 MG capsule Take 1 capsule (60  mg total) by mouth daily. 90 capsule 1  . Elderberry 575 MG/5ML SYRP Take 500 mg by mouth 2 (two) times a day.    Marland Kitchen EPINEPHrine (EPIPEN 2-PAK) 0.3 mg/0.3 mL IJ SOAJ injection Inject 0.3 mLs (0.3 mg total) into the muscle once. 1 Device 1  . famotidine (PEPCID) 20 MG tablet Take 1 tablet (20 mg total) by mouth 2 (two) times daily. 90 tablet 3  . fexofenadine (ALLEGRA) 180 MG tablet Take 1 tablet (180 mg total) by mouth daily as needed for allergies or rhinitis. 90 tablet 3  . FIBER PO Take 2 capsules by mouth 2 (two) times daily.    . fluticasone (FLONASE) 50 MCG/ACT nasal spray Place 1 spray into both nostrils daily. 48 g 1  . Misc Natural Products (TART CHERRY ADVANCED) CAPS 1200mg  by mouth twice daily    . Multiple Vitamin (MULTIVITAMIN) tablet Take 1 tablet by mouth daily.    Marland Kitchen omeprazole (PRILOSEC) 40 MG capsule TAKE 1 CAPSULE DAILY 90 capsule 3  . pravastatin (PRAVACHOL) 40 MG tablet Take 1 tablet (40 mg total) by mouth daily. 90 tablet 1  . Probiotic Product (PROBIOTIC DAILY PO) Take 1 capsule by mouth daily.    . vitamin C (ASCORBIC ACID) 500 MG tablet Take 500 mg by mouth daily.     No current facility-administered medications on file prior to visit.     LMP 05/14/1996  Objective:   Physical Exam Constitutional:      Appearance: She is well-developed.  HENT:     Right Ear: Tympanic membrane and ear canal normal.     Left Ear: Tympanic membrane and ear canal normal.  Neck:     Musculoskeletal: Neck supple.     Thyroid: No thyromegaly.  Cardiovascular:     Rate and Rhythm: Normal rate and regular rhythm.     Heart sounds: Normal heart sounds. No murmur.  Pulmonary:     Effort: Pulmonary effort is normal. No respiratory distress.     Breath sounds: Normal breath sounds. No wheezing.  Skin:    General: Skin is warm and dry.  Neurological:     Mental Status: She is alert and oriented to person, place, and time.  Psychiatric:        Behavior: Behavior normal.         Thought Content: Thought content normal.        Judgment: Judgment normal.           Assessment & Plan:  Bilateral ear itching-I suggested that she change from when asked earplugs to foam earplugs.  She can apply over-the-counter hydrocortisone cream gently with the tip of her finger to the ear canals as needed for itching.  Need for flu shot-we discussed risks/benefits of retrial of flu shot.  She would like to proceed with a regular dose flu shot.  I suggested that she take a tablet of Benadryl since she gets home.  Keep an eye on the area and let me know if she develops any redness or swelling.  She is also advised to go to the emergency department should she develop tongue or lip swelling or shortness of breath.  Patient verbalizes understanding.  Addendum: pt was observed for 15 minutes following flu shot.  No redness or adverse reaction was observed.

## 2019-03-26 ENCOUNTER — Other Ambulatory Visit (HOSPITAL_BASED_OUTPATIENT_CLINIC_OR_DEPARTMENT_OTHER): Payer: Self-pay | Admitting: Family

## 2019-03-26 DIAGNOSIS — Z1231 Encounter for screening mammogram for malignant neoplasm of breast: Secondary | ICD-10-CM

## 2019-03-29 DIAGNOSIS — F4310 Post-traumatic stress disorder, unspecified: Secondary | ICD-10-CM | POA: Diagnosis not present

## 2019-03-30 ENCOUNTER — Encounter: Payer: Self-pay | Admitting: Family

## 2019-04-10 ENCOUNTER — Other Ambulatory Visit: Payer: Self-pay | Admitting: Allergy and Immunology

## 2019-04-13 DIAGNOSIS — F4310 Post-traumatic stress disorder, unspecified: Secondary | ICD-10-CM | POA: Diagnosis not present

## 2019-04-26 DIAGNOSIS — F4310 Post-traumatic stress disorder, unspecified: Secondary | ICD-10-CM | POA: Diagnosis not present

## 2019-04-26 NOTE — Progress Notes (Signed)
Virtual Visit via Video Note  I connected with patient on 04/27/19 at 10:00 AM EDT by audio enabled telemedicine application and verified that I am speaking with the correct person using two identifiers.   THIS ENCOUNTER IS A VIRTUAL VISIT DUE TO COVID-19 - PATIENT WAS NOT SEEN IN THE OFFICE. PATIENT HAS CONSENTED TO VIRTUAL VISIT / TELEMEDICINE VISIT   Location of patient: home  Location of provider: office  I discussed the limitations of evaluation and management by telemedicine and the availability of in person appointments. The patient expressed understanding and agreed to proceed.   Subjective:   Vanessa Oliver is a 68 y.o. female who presents for Medicare Annual (Subsequent) preventive examination.  Review of Systems:  Home Safety/Smoke Alarms: Feels safe in home. Smoke alarms in place.  Lives with husband. Pt's mother also lives with them. 1 story home.   Female:        Mammo-  Scheduled 05/22/19     Dexa scan-  04/25/17      CCS- due 03/2023     Objective:     Vitals: Unable to assess. This visit is enabled though telemedicine due to Covid 19.   Advanced Directives 04/27/2019 08/16/2018 04/25/2018 04/05/2017 02/19/2017 09/06/2016 09/06/2016  Does Patient Have a Medical Advance Directive? Yes Yes Yes Yes Yes Yes Yes  Type of Paramedic of [place];Living will - [place];Living will Living will Living will Living will Living will  Does patient want to make changes to medical advance directive? No - Patient declined No - Patient declined - - - No - Patient declined No - Patient declined  Copy of [place] in Chart? Yes - validated most recent copy scanned in chart (See row information) - Yes - - - -  Would patient like information on creating a medical advance directive? - - - - - - -    Tobacco Social History   Tobacco Use  Smoking Status Never Smoker  Smokeless Tobacco Never Used     Counseling given:  Not Answered   Clinical Intake:  Pain : No/denies pain     Past Medical History:  Diagnosis Date  . Allergy    takes Singulair at night  . Anxiety   . Arthritis   . Cataract   . Chronic back pain    stenosis  . Depression    takes CYmbalta daily  . Esophageal stricture   . Family history of adverse reaction to anesthesia    oldest brother had trouble with anesthesia a long time ago but can't recall what  . Fibromyalgia   . Fibromyalgia   . GERD (gastroesophageal reflux disease)    takes Omeprazole daily  . Hemorrhoids   . History of bronchitis   . Hyperlipidemia   . IBS (irritable bowel syndrome)   . Joint pain   . Joint swelling   . Plantar fasciitis   . Rosacea   . Rosacea   . Sleep apnea    cpap- settings at 10 breathing   . TMJ (dislocation of temporomandibular joint)   . TMJ (temporomandibular joint disorder)   . Vasovagal episode back in the 80's  . Vasovagal syncope   . Weakness    right leg   Past Surgical History:  Procedure Laterality Date  . ABDOMINAL HYSTERECTOMY  1997  . CARPAL TUNNEL RELEASE Bilateral 2004  . CARPOMETACARPAL (CMC) FUSION OF THUMB  2015   thumb  . CATARACT EXTRACTION Right 2013  Dr Ellison Hughs  . CATARACT EXTRACTION Left 04/2016  . COLONOSCOPY    . COLONOSCOPY WITH ESOPHAGOGASTRODUODENOSCOPY (EGD) AND ESOPHAGEAL DILATION (ED)    . EYE SURGERY     scar tissue removed after cataract surgery  . 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 KNEE ARTHROSCOPY Right 1994   x 3  . KNEE SURGERY Right 01/21/2006  . LUMBAR LAMINECTOMY/DECOMPRESSION MICRODISCECTOMY Right 02/11/2015   Procedure: Laminectomy and Foraminotomy - Right - L3-L4;  Surgeon: Earnie Larsson, MD;  Location: [facility];  Service: Neurosurgery;  Laterality: Right;  Laminectomy and Foraminotomy - Right - L3-L4  . mallet finger Right 2011  . NASAL SEPTUM SURGERY  1970  . NISSEN FUNDOPLICATION  0000000  . RETINAL LASER PROCEDURE Right 02/01/2011   right eye   .  TONSILLECTOMY  1981  . TOTAL KNEE ARTHROPLASTY Right 09/06/2016   Procedure: RIGHT TOTAL KNEE ARTHROPLASTY;  Surgeon: Gaynelle Arabian, MD;  Location: [facility];  Service: Orthopedics;  Laterality: Right;  . TRIGGER FINGER RELEASE Bilateral 2005  . UPPER GASTROINTESTINAL ENDOSCOPY    . VARICOSE VEIN SURGERY Bilateral 2007, 2009   left 2007, right 2009  . VITRECTOMY Right 2012   Family History  Problem Relation Age of Onset  . Breast cancer Mother   . Arthritis Mother   . Hyperlipidemia Mother   . Hypertension Mother   . Allergic rhinitis Mother   . Urticaria Mother   . Lung cancer Father   . Pancreatic cancer Father   . Skin cancer Father   . Arthritis Father   . Allergic rhinitis Father   . Emphysema Father   . Lymphoma Brother   . Celiac disease Brother   . Hypertension Brother   . Thyroid disease Brother   . Allergic rhinitis Brother   . Hyperlipidemia Maternal Grandfather   . Heart disease Maternal Grandfather   . Stroke Maternal Grandfather   . Eczema Maternal Aunt   . Asthma Neg Hx   . Immunodeficiency Neg Hx   . Angioedema Neg Hx   . Colon cancer Neg Hx   . Colon polyps Neg Hx   . Esophageal cancer Neg Hx   . Rectal cancer Neg Hx   . Stomach cancer Neg Hx    Social History   Socioeconomic History  . Marital status: Married    Spouse name: Not on file  . Number of children: 0  . Years of education: Not on file  . Highest education level: Not on file  Occupational History  . Occupation: Hydrologist: [employer]  . Financial resource strain: Not on file  . Food insecurity    Worry: Not on file    Inability: Not on file  . Transportation needs    Medical: Not on file    Non-medical: Not on file  Tobacco Use  . Smoking status: Never Smoker  . Smokeless tobacco: Never Used  Substance and Sexual Activity  . Alcohol use: Yes    Alcohol/week: 0.0 standard drinks    Comment: rarely  . Drug use: No  . Sexual activity: Never    Birth  control/protection: Surgical  Lifestyle  . Physical activity    Days per week: Not on file    Minutes per session: Not on file  . Stress: Not on file  Relationships  . Social Herbalist on phone: Not on file    Gets together: Not on file  Attends religious service: Not on file    Active member of club or organization: Not on file    Attends meetings of clubs or organizations: Not on file    Relationship status: Not on file  Other Topics Concern  . Not on file  Social History Narrative   Works as an Glass blower/designer   Married- husband is 42 years older than her   Former Nature conservation officer   Grew up up [employer]   Has cats   Enjoys reading   No children    Outpatient Encounter Medications as of 04/27/2019  Medication Sig  . Azelaic Acid 15 % cream Apply twice daily to clean dry face  . CALCIUM CITRATE PO Take 1 tablet by mouth daily.  . cholecalciferol (VITAMIN D) 1000 units tablet Take 1 tablet (1,000 Units total) by mouth 2 (two) times daily.  . clotrimazole-betamethasone (LOTRISONE) cream Apply 1 application topically 2 (two) times daily as needed.  . Coenzyme Q10 200 MG capsule Take 200 mg by mouth daily.  . cyclobenzaprine (FLEXERIL) 10 MG tablet Take 0.5 tablets (5 mg total) by mouth daily as needed for muscle spasms.  . diclofenac sodium (VOLTAREN) 1 % GEL APPLY 2 GRAMS TOPICALLY TWICE A DAY AS NEEDED  . DULoxetine (CYMBALTA) 60 MG capsule Take 1 capsule (60 mg total) by mouth daily.  Kendall Flack 575 MG/5ML SYRP Take 500 mg by mouth 2 (two) times a day.  . famotidine (PEPCID) 20 MG tablet Take 1 tablet (20 mg total) by mouth 2 (two) times daily.  . fexofenadine (ALLEGRA) 180 MG tablet Take 1 tablet (180 mg total) by mouth daily as needed for allergies or rhinitis.  Marland Kitchen FIBER PO Take 2 capsules by mouth 2 (two) times daily.  . fluticasone (FLONASE) 50 MCG/ACT nasal spray Place 1 spray into both nostrils daily.  . Misc Natural Products (TART CHERRY ADVANCED) CAPS 1200mg  by  mouth twice daily  . Multiple Vitamin (MULTIVITAMIN) tablet Take 1 tablet by mouth daily.  Marland Kitchen omeprazole (PRILOSEC) 40 MG capsule TAKE 1 CAPSULE DAILY  . pravastatin (PRAVACHOL) 40 MG tablet Take 1 tablet (40 mg total) by mouth daily.  . Probiotic Product (PROBIOTIC DAILY PO) Take 1 capsule by mouth daily.  . vitamin C (ASCORBIC ACID) 500 MG tablet Take 500 mg by mouth daily.  Marland Kitchen EPINEPHrine (EPIPEN 2-PAK) 0.3 mg/0.3 mL IJ SOAJ injection Inject 0.3 mLs (0.3 mg total) into the muscle once. (Patient not taking: Reported on 04/27/2019)   No facility-administered encounter medications on file as of 04/27/2019.     Activities of Daily Living No flowsheet data found.  Patient Care Team: Debbrah Alar, NP as PCP - General (Internal Medicine) Gaynelle Arabian, MD as Consulting Physician (Orthopedic Surgery) Earnie Larsson, MD as Consulting Physician (Neurosurgery) Laurence Aly, [place] as Consulting Physician (Optometry) Wylene Simmer, MD as Consulting Physician (Orthopedic Surgery) Susa Day, MD as Consulting Physician (Orthopedic Surgery) Rigoberto Noel, MD as Consulting Physician (Pulmonary Disease)    Assessment:   This is a routine wellness examination for Maret. Physical assessment deferred to PCP.  Exercise Activities and Dietary recommendations   Diet (meal preparation, eat out, water intake, caffeinated beverages, dairy products, fruits and vegetables): in general, a "healthy" diet  , well balanced Breakfast: Raisin bran Lunch: yogurt, cheese and crackers, klondike bar Dinner: grilled chicken, green beans, potatoes, dried dates  Goals    . Healthy lifestyle    . Increase physical activity       Fall Risk Fall Risk  04/27/2019 05/26/2018 04/25/2018 04/05/2017 08/09/2016  Falls in the past year? 0 1 Yes No No  Comment - Emmi Telephone Survey: data to providers prior to load - - -  Number falls in past yr: 0 1 1 - -  Comment - Emmi Telephone Survey Actual Response = 1 - - -   Injury with Fall? 0 1 Yes - -  Follow up - - Education provided;Falls prevention discussed - -    Depression Screen PHQ 2/9 Scores 04/27/2019 01/01/2019 05/23/2018 04/25/2018  PHQ - 2 Score 0 6 0 1  PHQ- 9 Score - 25 1 -     Cognitive Function  Ad8 score reviewed for issues:  Issues making decisions:no  Less interest in hobbies / activities:no  Repeats questions, stories (family complaining):no  Trouble using ordinary gadgets (microwave, computer, phone):no  Forgets the month or year: no  Mismanaging finances: no  Remembering appts:no  Daily problems with thinking and/or memory:no Ad8 score is=0     MMSE - Mini Mental State Exam 03/19/2016  Orientation to time 5  Orientation to Place 5  Registration 3  Attention/ Calculation 5  Recall 3  Language- name 2 objects 2  Language- repeat 1  Language- follow 3 step command 3  Language- read & follow direction 1  Write a sentence 1  Copy design 1  Total score 30        Immunization History  Administered Date(s) Administered  . Influenza, High Dose Seasonal PF 03/19/2016, 03/18/2017, 03/14/2018  . Influenza,inj,Quad PF,6+ Mos 03/14/2015, 03/16/2019  . Influenza-Unspecified 03/23/2014  . Pneumococcal Conjugate-13 09/23/2014  . Td 02/16/2011  . Zoster 08/08/2009    Screening Tests Health Maintenance  Topic Date Due  . MAMMOGRAM  05/17/2020  . TETANUS/TDAP  02/15/2021  . COLONOSCOPY  04/04/2023  . INFLUENZA VACCINE  Completed  . DEXA SCAN  Completed  . Hepatitis C Screening  Completed      Plan:   See you next year!  Keep up the great work!  I have personally reviewed and noted the following in the patient's chart:   . Medical and social history . Use of alcohol, tobacco or illicit drugs  . Current medications and supplements . Functional ability and status . Nutritional status . Physical activity . Advanced directives . List of other physicians . Hospitalizations, surgeries, and ER visits in  previous 12 months . Vitals . Screenings to include cognitive, depression, and falls . Referrals and appointments  In addition, I have reviewed and discussed with patient certain preventive protocols, quality metrics, and best practice recommendations. A written personalized care plan for preventive services as well as general preventive health recommendations were provided to patient.     Shela Nevin, [place]  04/27/2019

## 2019-04-27 ENCOUNTER — Ambulatory Visit: Payer: Medicare Other | Admitting: *Deleted

## 2019-04-27 ENCOUNTER — Other Ambulatory Visit: Payer: Self-pay

## 2019-04-27 ENCOUNTER — Ambulatory Visit (INDEPENDENT_AMBULATORY_CARE_PROVIDER_SITE_OTHER): Payer: Medicare Other | Admitting: *Deleted

## 2019-04-27 ENCOUNTER — Encounter: Payer: Self-pay | Admitting: *Deleted

## 2019-04-27 DIAGNOSIS — Z0000 Encounter for general adult medical examination without abnormal findings: Secondary | ICD-10-CM | POA: Diagnosis not present

## 2019-04-27 NOTE — Patient Instructions (Signed)
See you next year!  Keep up the great work!   Vanessa Oliver , Thank you for taking time to come for your Medicare Wellness Visit. I appreciate your ongoing commitment to your health goals. Please review the following plan we discussed and let me know if I can assist you in the future.   These are the goals we discussed: Goals    . Healthy lifestyle    . Increase physical activity       This is a list of the screening recommended for you and due dates:  Health Maintenance  Topic Date Due  . Mammogram  05/17/2020  . Tetanus Vaccine  02/15/2021  . Colon Cancer Screening  04/04/2023  . Flu Shot  Completed  . DEXA scan (bone density measurement)  Completed  .  Hepatitis C: One time screening is recommended by Center for Disease Control  (CDC) for  adults born from [birth year] through 1967.   Completed    Health Maintenance After Age 79 After age 65, you are at a higher risk for certain long-term diseases and infections as well as injuries from falls. Falls are a major cause of broken bones and head injuries in people who are older than age 18. Getting regular preventive care can help to keep you healthy and well. Preventive care includes getting regular testing and making lifestyle changes as recommended by your health care provider. Talk with your health care provider about:  Which screenings and tests you should have. A screening is a test that checks for a disease when you have no symptoms.  A diet and exercise plan that is right for you. What should I know about screenings and tests to prevent falls? Screening and testing are the best ways to find a health problem early. Early diagnosis and treatment give you the best chance of managing medical conditions that are common after age 109. Certain conditions and lifestyle choices may make you more likely to have a fall. Your health care provider may recommend:  Regular vision checks. Poor vision and conditions such as cataracts can make you more  likely to have a fall. If you wear glasses, make sure to get your prescription updated if your vision changes.  Medicine review. Work with your health care provider to regularly review all of the medicines you are taking, including over-the-counter medicines. Ask your health care provider about any side effects that may make you more likely to have a fall. Tell your health care provider if any medicines that you take make you feel dizzy or sleepy.  Osteoporosis screening. Osteoporosis is a condition that causes the bones to get weaker. This can make the bones weak and cause them to break more easily.  Blood pressure screening. Blood pressure changes and medicines to control blood pressure can make you feel dizzy.  Strength and balance checks. Your health care provider may recommend certain tests to check your strength and balance while standing, walking, or changing positions.  Foot health exam. Foot pain and numbness, as well as not wearing proper footwear, can make you more likely to have a fall.  Depression screening. You may be more likely to have a fall if you have a fear of falling, feel emotionally low, or feel unable to do activities that you used to do.  Alcohol use screening. Using too much alcohol can affect your balance and may make you more likely to have a fall. What actions can I take to lower my risk of falls? General  instructions  Talk with your health care provider about your risks for falling. Tell your health care provider if: ? You fall. Be sure to tell your health care provider about all falls, even ones that seem minor. ? You feel dizzy, sleepy, or off-balance.  Take over-the-counter and prescription medicines only as told by your health care provider. These include any supplements.  Eat a healthy diet and maintain a healthy weight. A healthy diet includes low-fat dairy products, low-fat (lean) meats, and fiber from whole grains, beans, and lots of fruits and  vegetables. Home safety  Remove any tripping hazards, such as rugs, cords, and clutter.  Install safety equipment such as grab bars in bathrooms and safety rails on stairs.  Keep rooms and walkways well-lit. Activity   Follow a regular exercise program to stay fit. This will help you maintain your balance. Ask your health care provider what types of exercise are appropriate for you.  If you need a cane or walker, use it as recommended by your health care provider.  Wear supportive shoes that have nonskid soles. Lifestyle  Do not drink alcohol if your health care provider tells you not to drink.  If you drink alcohol, limit how much you have: ? 0-1 drink a day for women. ? 0-2 drinks a day for men.  Be aware of how much alcohol is in your drink. In the [place] one drink equals one typical bottle of beer (12 oz), one-half glass of wine (5 oz), or one shot of hard liquor (1 oz).  Do not use any products that contain nicotine or tobacco, such as cigarettes and e-cigarettes. If you need help quitting, ask your health care provider. Summary  Having a healthy lifestyle and getting preventive care can help to protect your health and wellness after age 73.  Screening and testing are the best way to find a health problem early and help you avoid having a fall. Early diagnosis and treatment give you the best chance for managing medical conditions that are more common for people who are older than age 55.  Falls are a major cause of broken bones and head injuries in people who are older than age 66. Take precautions to prevent a fall at home.  Work with your health care provider to learn what changes you can make to improve your health and wellness and to prevent falls. This information is not intended to replace advice given to you by your health care provider. Make sure you discuss any questions you have with your health care provider. Document Released: 05/11/2017 Document Revised:  10/19/2018 Document Reviewed: 05/11/2017 Elsevier Patient Education  2020 [employer].

## 2019-05-04 ENCOUNTER — Encounter: Payer: Self-pay | Admitting: Adult Health

## 2019-05-04 ENCOUNTER — Ambulatory Visit (INDEPENDENT_AMBULATORY_CARE_PROVIDER_SITE_OTHER): Payer: Medicare Other | Admitting: Adult Health

## 2019-05-04 DIAGNOSIS — G4733 Obstructive sleep apnea (adult) (pediatric): Secondary | ICD-10-CM

## 2019-05-04 NOTE — Patient Instructions (Signed)
Keep up good work on CPAP  Continue on CPAP At bedtime   Work on healthy weight .  Remain active .  Do not drive if sleepy . Follow up with Dr. Alva  In 1 year and As needed    

## 2019-05-04 NOTE — Progress Notes (Signed)
Virtual Visit via Telephone Note  I connected with Vanessa Oliver on 05/04/19 at  2:00 PM EDT by telephone and verified that I am speaking with the correct person using two identifiers.  Location: Patient: Home  Provider: Office    I discussed the limitations, risks, security and privacy concerns of performing an evaluation and management service by telephone and the availability of in person appointments. I also discussed with the patient that there may be a patient responsible charge related to this service. The patient expressed understanding and agreed to proceed.   History of Present Illness: 68 year old female followed for obstructive sleep apnea  Today's televisit is a 1 year follow-up for obstructive sleep apnea Patient has underlying obstructive sleep apnea is on nocturnal CPAP.  Patient says she is doing very well at night on CPAP . Feel she benefits from CPAP . No significant daytime sleepiness . She is tired , under a lot of stress , is tired a lot . She is the caregiver for both mother and husband. Does not have much help.. CPAP download shows excellent compliance with 100% usage.  Daily average use is at 8 hours.  AHI 1.9.  Patient is on CPAP 10 cm H2O  Observations/Objective: PSG 07/2014-weight 163 pounds- RDI 23/hour, with 24 central apneas, total sleep time 277 minutes  Assessment and Plan: Moderate OSA - excellent control and compliance on CPAP   Plan  Patient Instructions  Keep up good work on CPAP  Continue on CPAP At bedtime   Work on healthy weight .  Remain active .  Do not drive if sleepy . Follow up with Dr. Elsworth Soho  In 1 year and As needed        Follow Up Instructions: Follow up in 1 year and As needed     I discussed the assessment and treatment plan with the patient. The patient was provided an opportunity to ask questions and all were answered. The patient agreed with the plan and demonstrated an understanding of the instructions.   The patient was  advised to call back or seek an in-person evaluation if the symptoms worsen or if the condition fails to improve as anticipated.  I provided 22  minutes of non-face-to-face time during this encounter.   [name], NP

## 2019-05-07 ENCOUNTER — Ambulatory Visit: Payer: Medicare Other | Admitting: Adult Health

## 2019-05-22 ENCOUNTER — Encounter (HOSPITAL_BASED_OUTPATIENT_CLINIC_OR_DEPARTMENT_OTHER): Payer: Self-pay

## 2019-05-22 ENCOUNTER — Ambulatory Visit (HOSPITAL_BASED_OUTPATIENT_CLINIC_OR_DEPARTMENT_OTHER)
Admission: RE | Admit: 2019-05-22 | Discharge: 2019-05-22 | Disposition: A | Discharge status: Home / Self Care | Payer: Medicare Other | Source: Ambulatory Visit | Attending: Family | Admitting: Family

## 2019-05-22 ENCOUNTER — Other Ambulatory Visit: Payer: Self-pay

## 2019-05-22 DIAGNOSIS — Z1231 Encounter for screening mammogram for malignant neoplasm of breast: Secondary | ICD-10-CM | POA: Diagnosis not present

## 2019-05-24 DIAGNOSIS — F4310 Post-traumatic stress disorder, unspecified: Secondary | ICD-10-CM | POA: Diagnosis not present

## 2019-06-16 ENCOUNTER — Encounter: Payer: Self-pay | Admitting: Family

## 2019-06-19 MED ORDER — CLOBETASOL PROPIONATE 0.05 % EX CREA: 1.0000 "application " | TOPICAL_CREAM | Freq: Two times a day (BID) | CUTANEOUS | 3 refills | Status: DC

## 2019-06-21 DIAGNOSIS — F4310 Post-traumatic stress disorder, unspecified: Secondary | ICD-10-CM | POA: Diagnosis not present

## 2019-07-06 ENCOUNTER — Encounter: Payer: Self-pay | Admitting: Family

## 2019-07-11 ENCOUNTER — Ambulatory Visit (INDEPENDENT_AMBULATORY_CARE_PROVIDER_SITE_OTHER): Payer: Medicare Other | Admitting: Internal Medicine

## 2019-07-11 ENCOUNTER — Other Ambulatory Visit: Payer: Self-pay

## 2019-07-11 DIAGNOSIS — F439 Reaction to severe stress, unspecified: Secondary | ICD-10-CM

## 2019-07-11 DIAGNOSIS — R1033 Periumbilical pain: Secondary | ICD-10-CM

## 2019-07-11 DIAGNOSIS — M545 Low back pain, unspecified: Secondary | ICD-10-CM

## 2019-07-11 MED ORDER — CYCLOBENZAPRINE HCL 10 MG PO TABS: 5.0000 mg | ORAL_TABLET | Freq: Every day | ORAL | 1 refills | Status: DC | PRN

## 2019-07-11 NOTE — Progress Notes (Signed)
Subjective:    Patient ID: Vanessa Oliver, female    DOB: 06-Oct-1950, 68 y.o.   MRN: [id number]  DOS:  07/11/2019 Type of visit - description: Virtual Visit via Video Note  I connected with the above patient  by a video enabled telemedicine application and verified that I am speaking with the correct person using two identifiers.   THIS ENCOUNTER IS A VIRTUAL VISIT DUE TO COVID-19 - PATIENT WAS NOT SEEN IN THE OFFICE. PATIENT HAS CONSENTED TO VIRTUAL VISIT / TELEMEDICINE VISIT   Location of patient: home  Location of provider: office  I discussed the limitations of evaluation and management by telemedicine and the availability of in person appointments. The patient expressed understanding and agreed to proceed.  History of Present Illness: Acute 6-week history of increasing chronic back pain, at this time the pain is mostly at the right side between the low back and hip. It increases when she bends or change position.  Also, having stomach ache, for the last 6 weeks as well, described as a steady "toothache". She denies taking oral NSAIDs No fever chills or weight loss No nausea, vomiting, diarrhea.  No blood in the stools.  No constipation.  All of the above happening in the context of severe stress, her husband was admitted recently to the hospital due to urosepsis, she is now doing all the home chores, she is the sole provider for her husband and also her mother.  A lot of physical work that she is not used to do.     Review of Systems See above, also, denies any pain radiating to the lower extremities No leg numbness or weakness.   Past Medical History:  Diagnosis Date  . Allergy    takes Singulair at night  . Anxiety   . Arthritis   . Cataract   . Chronic back pain    stenosis  . Depression    takes CYmbalta daily  . Esophageal stricture   . Family history of adverse reaction to anesthesia    oldest brother had trouble with anesthesia a long time ago but  can't recall what  . Fibromyalgia   . Fibromyalgia   . GERD (gastroesophageal reflux disease)    takes Omeprazole daily  . Hemorrhoids   . History of bronchitis   . Hyperlipidemia   . IBS (irritable bowel syndrome)   . Joint pain   . Joint swelling   . Plantar fasciitis   . Rosacea   . Rosacea   . Sleep apnea    cpap- settings at 10 breathing   . TMJ (dislocation of temporomandibular joint)   . TMJ (temporomandibular joint disorder)   . Vasovagal episode back in the 80's  . Vasovagal syncope   . Weakness    right leg    Past Surgical History:  Procedure Laterality Date  . ABDOMINAL HYSTERECTOMY  1997  . CARPAL TUNNEL RELEASE Bilateral 2004  . CARPOMETACARPAL (CMC) FUSION OF THUMB  2015   thumb  . CATARACT EXTRACTION Right 2013   Dr Ellison Hughs  . CATARACT EXTRACTION Left 04/2016  . COLONOSCOPY    . COLONOSCOPY WITH ESOPHAGOGASTRODUODENOSCOPY (EGD) AND ESOPHAGEAL DILATION (ED)    . EYE SURGERY     scar tissue removed after cataract surgery  . 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 KNEE ARTHROSCOPY Right 1994   x 3  . KNEE SURGERY Right 01/21/2006  . LUMBAR LAMINECTOMY/DECOMPRESSION MICRODISCECTOMY Right 02/11/2015   Procedure: Laminectomy and  Foraminotomy - Right - L3-L4;  Surgeon: Earnie Larsson, MD;  Location: [facility];  Service: Neurosurgery;  Laterality: Right;  Laminectomy and Foraminotomy - Right - L3-L4  . mallet finger Right 2011  . NASAL SEPTUM SURGERY  1970  . NISSEN FUNDOPLICATION  0000000  . RETINAL LASER PROCEDURE Right 02/01/2011   right eye   . TONSILLECTOMY  1981  . TOTAL KNEE ARTHROPLASTY Right 09/06/2016   Procedure: RIGHT TOTAL KNEE ARTHROPLASTY;  Surgeon: Gaynelle Arabian, MD;  Location: [facility];  Service: Orthopedics;  Laterality: Right;  . TRIGGER FINGER RELEASE Bilateral 2005  . UPPER GASTROINTESTINAL ENDOSCOPY    . VARICOSE VEIN SURGERY Bilateral 2007, 2009   left 2007, right 2009  . VITRECTOMY Right 2012    Social History   Socioeconomic  History  . Marital status: Married    Spouse name: Not on file  . Number of children: 0  . Years of education: Not on file  . Highest education level: Not on file  Occupational History  . Occupation: Hydrologist: [employer]  Tobacco Use  . Smoking status: Never Smoker  . Smokeless tobacco: Never Used  Substance and Sexual Activity  . Alcohol use: Yes    Alcohol/week: 0.0 standard drinks    Comment: rarely  . Drug use: No  . Sexual activity: Never    Birth control/protection: Surgical  Other Topics Concern  . Not on file  Social History Narrative   Works as an Glass blower/designer   Married- husband is 41 years older than her   Former Dance movement psychotherapist up up [employer]   Has cats   Enjoys reading   No children   Social Determinants of Radio broadcast assistant Strain:   . Difficulty of Paying Living Expenses: Not on file  Food Insecurity:   . Worried About Charity fundraiser in the Last Year: Not on file  . Ran Out of Food in the Last Year: Not on file  Transportation Needs:   . Lack of Transportation (Medical): Not on file  . Lack of Transportation (Non-Medical): Not on file  Physical Activity:   . Days of Exercise per Week: Not on file  . Minutes of Exercise per Session: Not on file  Stress:   . Feeling of Stress : Not on file  Social Connections:   . Frequency of Communication with Friends and Family: Not on file  . Frequency of Social Gatherings with Friends and Family: Not on file  . Attends Religious Services: Not on file  . Active Member of Clubs or Organizations: Not on file  . Attends Archivist Meetings: Not on file  . Marital Status: Not on file  Intimate Partner Violence:   . Fear of Current or Ex-Partner: Not on file  . Emotionally Abused: Not on file  . Physically Abused: Not on file  . Sexually Abused: Not on file      Allergies as of 07/11/2019      Reactions   Aleve [naproxen Sodium]    Swelling, itching   Cefuroxime Axetil     Hives / "throat swelling"   Chlorzoxazone Rash, Other (See Comments)   "breathing problems"    Oxycodone    Hallucinations, rash   Penicillins Anaphylaxis, Hives   Has patient had a PCN reaction causing immediate rash, facial/tongue/throat swelling, SOB or lightheadedness with hypotension: Yes Has patient had a PCN reaction causing severe rash involving mucus membranes or skin necrosis: No  Has patient had a PCN reaction that required hospitalization No Has patient had a PCN reaction occurring within the last 10 years: No If all of the above answers are "NO", then may proceed with Cephalosporin use.   Adhesive [tape]    rash   Cataflam [diclofenac Potassium]    Lip swelling   Clarithromycin Diarrhea   Flagyl [metronidazole Hcl]    ?diarrhea   Fluzone [flu Virus Vaccine]    Had local redness with high dose.    Glucosamine Hives   Guaifenesin & Derivatives    Stomach upset   Hydrocodone-acetaminophen    REACTION: rash, tightening of throat   Ketorolac Tromethamine    ?reaction   Pentazocine    Involuntary twitching   Pneumovax [pneumococcal Polysaccharide Vaccine] Other (See Comments)   Redness/swelling at site, nausea   Prevnar [pneumococcal 13-val Conj Vacc] Other (See Comments)   Swelling, redness, nausea   Smz-tmp Ds [sulfamethoxazole W/trimethoprim (co-trimoxazole)]    ?unknown reaction   Tramadol    constipation   Trazodone And Nefazodone Other (See Comments)   Scratchy throat / congestion   Latex Rash      Medication List       Accurate as of July 11, 2019  1:50 PM. If you have any questions, ask your nurse or doctor.        Azelaic Acid 15 % cream Apply twice daily to clean dry face   CALCIUM CITRATE PO Take 1 tablet by mouth daily.   cholecalciferol 1000 units tablet Commonly known as: VITAMIN D Take 1 tablet (1,000 Units total) by mouth 2 (two) times daily.   clobetasol cream 0.05 % Commonly known as: TEMOVATE Apply 1 application topically 2  (two) times daily.   clotrimazole-betamethasone cream Commonly known as: Lotrisone Apply 1 application topically 2 (two) times daily as needed.   Coenzyme Q10 200 MG capsule Take 200 mg by mouth daily.   cyclobenzaprine 10 MG tablet Commonly known as: FLEXERIL Take 0.5 tablets (5 mg total) by mouth daily as needed for muscle spasms.   diclofenac sodium 1 % Gel Commonly known as: VOLTAREN APPLY 2 GRAMS TOPICALLY TWICE A DAY AS NEEDED   DULoxetine 60 MG capsule Commonly known as: Cymbalta Take 1 capsule (60 mg total) by mouth daily.   Elderberry 575 MG/5ML Syrp Take 500 mg by mouth 2 (two) times a day.   EPINEPHrine 0.3 mg/0.3 mL Soaj injection Commonly known as: EpiPen 2-Pak Inject 0.3 mLs (0.3 mg total) into the muscle once.   famotidine 20 MG tablet Commonly known as: PEPCID Take 1 tablet (20 mg total) by mouth 2 (two) times daily.   fexofenadine 180 MG tablet Commonly known as: ALLEGRA Take 1 tablet (180 mg total) by mouth daily as needed for allergies or rhinitis.   FIBER PO Take 2 capsules by mouth 2 (two) times daily.   fluticasone 50 MCG/ACT nasal spray Commonly known as: FLONASE Place 1 spray into both nostrils daily.   multivitamin tablet Take 1 tablet by mouth daily.   omeprazole 40 MG capsule Commonly known as: PRILOSEC TAKE 1 CAPSULE DAILY   pravastatin 40 MG tablet Commonly known as: PRAVACHOL Take 1 tablet (40 mg total) by mouth daily.   PROBIOTIC DAILY PO Take 1 capsule by mouth daily.   Tart Cherry Advanced Caps 1200mg  by mouth twice daily   vitamin C 500 MG tablet Commonly known as: ASCORBIC ACID Take 500 mg by mouth daily.  Objective:   Physical Exam LMP 05/14/1996  This is a virtual video visit.  She is alert oriented x3, no apparent distress.    Assessment    68 year old female, PMH includes OSA, GERD, osteopenia, DJD,Spinal stenosis, fibromyalgia, depression anxiety,Multiple drug allergies including Aleve,  oxycodone, Toradol, tramadol, presents with  Acute on chronic back pain: As described above, in the context of increased physical activity due to being the sole caretaker for her husband and her mother. No radicular symptoms. On chart review, MRI in 2018 show back DJD, spinal stenosis & changes consistent with previous surgery She had a CT scan of the abdomen in 2016 with no AAA. Plan: I recommend back  stretching and watch her posture She is taking Flexeril, 5 mg at night, will send a refill as it seems to help Encouraged Tylenol 500 mg 2 tablets 3 times a day Continue Voltaren gel Heating, ice pads as needed I offered prednisone, declined it due to insomnia. Abdominal pain, this is going on for 6 weeks, fortunately no red flag symptoms, that need to be assessed in person.  We agreed that she will call and get an appointment in person in the next couple of weeks, understanding that if she has severe symptoms needs to go to the ER. Stress: Due to husband recent illness, listening therapy provided Of note, we could not see the patient in person as I would have preferred due to COVID-19 restrictions. Today, I spent more than  25  min with the patient: >50% of the time counseling regards two acute problems, reviewing the chart, counseling about stress, posture, stretching.    I discussed the assessment and treatment plan with the patient. The patient was provided an opportunity to ask questions and all were answered. The patient agreed with the plan and demonstrated an understanding of the instructions.   The patient was advised to call back or seek an in-person evaluation if the symptoms worsen or if the condition fails to improve as anticipated.

## 2019-07-22 ENCOUNTER — Encounter: Payer: Self-pay | Admitting: Family

## 2019-07-23 ENCOUNTER — Ambulatory Visit (INDEPENDENT_AMBULATORY_CARE_PROVIDER_SITE_OTHER): Payer: Medicare Other | Admitting: Family

## 2019-07-23 ENCOUNTER — Encounter: Payer: Self-pay | Admitting: Family

## 2019-07-23 DIAGNOSIS — J029 Acute pharyngitis, unspecified: Secondary | ICD-10-CM

## 2019-07-23 NOTE — Progress Notes (Signed)
Virtual Visit via Video Note  I connected with Vanessa Oliver on 07/23/19 at 12:40 PM EST by a video enabled telemedicine application and verified that I am speaking with the correct person using two identifiers.  Location: Patient: home Provider: home   I discussed the limitations of evaluation and management by telemedicine and the availability of in person appointments. The patient expressed understanding and agreed to proceed.  History of Present Illness:  Patient is a 69 yr old female who presents today with chief complaint of sore throat. Reports throat is scratchy starting on Friday. Saturday was worse. Put a humidifier in her room. Denies fever, body aches.  Has had HA. Denies loss of taste or smell.  Reports left side of her throat is hurting more than the right  Reports sore throat is 4/10 in severity.   Past Medical History:  Diagnosis Date  . Allergy    takes Singulair at night  . Anxiety   . Arthritis   . Cataract   . Chronic back pain    stenosis  . Depression    takes CYmbalta daily  . Esophageal stricture   . Family history of adverse reaction to anesthesia    oldest brother had trouble with anesthesia a long time ago but can't recall what  . Fibromyalgia   . Fibromyalgia   . GERD (gastroesophageal reflux disease)    takes Omeprazole daily  . Hemorrhoids   . History of bronchitis   . Hyperlipidemia   . IBS (irritable bowel syndrome)   . Joint pain   . Joint swelling   . Plantar fasciitis   . Rosacea   . Rosacea   . Sleep apnea    cpap- settings at 10 breathing   . TMJ (dislocation of temporomandibular joint)   . TMJ (temporomandibular joint disorder)   . Vasovagal episode back in the 80's  . Vasovagal syncope   . Weakness    right leg     Social History   Socioeconomic History  . Marital status: Married    Spouse name: Not on file  . Number of children: 0  . Years of education: Not on file  . Highest education level: Not on file   Occupational History  . Occupation: Hydrologist: [employer]  Tobacco Use  . Smoking status: Never Smoker  . Smokeless tobacco: Never Used  Substance and Sexual Activity  . Alcohol use: Yes    Alcohol/week: 0.0 standard drinks    Comment: rarely  . Drug use: No  . Sexual activity: Never    Birth control/protection: Surgical  Other Topics Concern  . Not on file  Social History Narrative   Works as an Glass blower/designer   Married- husband is 60 years older than her   Former Dance movement psychotherapist up up [employer]   Has cats   Enjoys reading   No children   Social Determinants of Radio broadcast assistant Strain:   . Difficulty of Paying Living Expenses: Not on file  Food Insecurity:   . Worried About Charity fundraiser in the Last Year: Not on file  . Ran Out of Food in the Last Year: Not on file  Transportation Needs:   . Lack of Transportation (Medical): Not on file  . Lack of Transportation (Non-Medical): Not on file  Physical Activity:   . Days of Exercise per Week: Not on file  . Minutes of Exercise per Session: Not on file  Stress:   . Feeling of Stress : Not on file  Social Connections:   . Frequency of Communication with Friends and Family: Not on file  . Frequency of Social Gatherings with Friends and Family: Not on file  . Attends Religious Services: Not on file  . Active Member of Clubs or Organizations: Not on file  . Attends Archivist Meetings: Not on file  . Marital Status: Not on file  Intimate Partner Violence:   . Fear of Current or Ex-Partner: Not on file  . Emotionally Abused: Not on file  . Physically Abused: Not on file  . Sexually Abused: Not on file    Past Surgical History:  Procedure Laterality Date  . ABDOMINAL HYSTERECTOMY  1997  . CARPAL TUNNEL RELEASE Bilateral 2004  . CARPOMETACARPAL (CMC) FUSION OF THUMB  2015   thumb  . CATARACT EXTRACTION Right 2013   Dr Ellison Hughs  . CATARACT EXTRACTION Left 04/2016  . COLONOSCOPY    .  COLONOSCOPY WITH ESOPHAGOGASTRODUODENOSCOPY (EGD) AND ESOPHAGEAL DILATION (ED)    . EYE SURGERY     scar tissue removed after cataract surgery  . 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 KNEE ARTHROSCOPY Right 1994   x 3  . KNEE SURGERY Right 01/21/2006  . LUMBAR LAMINECTOMY/DECOMPRESSION MICRODISCECTOMY Right 02/11/2015   Procedure: Laminectomy and Foraminotomy - Right - L3-L4;  Surgeon: Earnie Larsson, MD;  Location: [facility];  Service: Neurosurgery;  Laterality: Right;  Laminectomy and Foraminotomy - Right - L3-L4  . mallet finger Right 2011  . NASAL SEPTUM SURGERY  1970  . NISSEN FUNDOPLICATION  0000000  . RETINAL LASER PROCEDURE Right 02/01/2011   right eye   . TONSILLECTOMY  1981  . TOTAL KNEE ARTHROPLASTY Right 09/06/2016   Procedure: RIGHT TOTAL KNEE ARTHROPLASTY;  Surgeon: Gaynelle Arabian, MD;  Location: [facility];  Service: Orthopedics;  Laterality: Right;  . TRIGGER FINGER RELEASE Bilateral 2005  . UPPER GASTROINTESTINAL ENDOSCOPY    . VARICOSE VEIN SURGERY Bilateral 2007, 2009   left 2007, right 2009  . VITRECTOMY Right 2012    Family History  Problem Relation Age of Onset  . Breast cancer Mother   . Arthritis Mother   . Hyperlipidemia Mother   . Hypertension Mother   . Allergic rhinitis Mother   . Urticaria Mother   . Lung cancer Father   . Pancreatic cancer Father   . Skin cancer Father   . Arthritis Father   . Allergic rhinitis Father   . Emphysema Father   . Lymphoma Brother   . Celiac disease Brother   . Hypertension Brother   . Thyroid disease Brother   . Allergic rhinitis Brother   . Hyperlipidemia Maternal Grandfather   . Heart disease Maternal Grandfather   . Stroke Maternal Grandfather   . Eczema Maternal Aunt   . Asthma Neg Hx   . Immunodeficiency Neg Hx   . Angioedema Neg Hx   . Colon cancer Neg Hx   . Colon polyps Neg Hx   . Esophageal cancer Neg Hx   . Rectal cancer Neg Hx   . Stomach cancer Neg Hx     Allergies  Allergen Reactions  .  Aleve [Naproxen Sodium]     Swelling, itching  . Cefuroxime Axetil     Hives / "throat swelling"  . Chlorzoxazone Rash and Other (See Comments)    "breathing problems"   . Oxycodone     Hallucinations, rash  .  Penicillins Anaphylaxis and Hives    Has patient had a PCN reaction causing immediate rash, facial/tongue/throat swelling, SOB or lightheadedness with hypotension: Yes Has patient had a PCN reaction causing severe rash involving mucus membranes or skin necrosis: No Has patient had a PCN reaction that required hospitalization No Has patient had a PCN reaction occurring within the last 10 years: No If all of the above answers are "NO", then may proceed with Cephalosporin use.   . Adhesive [Tape]     rash  . Cataflam [Diclofenac Potassium]     Lip swelling  . Clarithromycin Diarrhea  . Flagyl [Metronidazole Hcl]     ?diarrhea  . Fluzone [Flu Virus Vaccine]     Had local redness with high dose.   . Glucosamine Hives  . Guaifenesin & Derivatives     Stomach upset  . Hydrocodone-Acetaminophen     REACTION: rash, tightening of throat  . Ketorolac Tromethamine     ?reaction  . Pentazocine     Involuntary twitching  . Pneumovax [Pneumococcal Polysaccharide Vaccine] Other (See Comments)    Redness/swelling at site, nausea  . Prevnar [Pneumococcal 13-Val Conj Vacc] Other (See Comments)    Swelling, redness, nausea  . Smz-Tmp Ds [Sulfamethoxazole W/Trimethoprim (Co-Trimoxazole)]     ?unknown reaction  . Tramadol     constipation  . Trazodone And Nefazodone Other (See Comments)    Scratchy throat / congestion  . Latex Rash    Current Outpatient Medications on File Prior to Visit  Medication Sig Dispense Refill  . Azelaic Acid 15 % cream Apply twice daily to clean dry face 150 g 1  . CALCIUM CITRATE PO Take 1 tablet by mouth daily.    . cholecalciferol (VITAMIN D) 1000 units tablet Take 1 tablet (1,000 Units total) by mouth 2 (two) times daily.    . clobetasol cream  (TEMOVATE) [id number] % Apply 1 application topically 2 (two) times daily. 30 g 3  . clotrimazole-betamethasone (LOTRISONE) cream Apply 1 application topically 2 (two) times daily as needed. 30 g 0  . Coenzyme Q10 200 MG capsule Take 200 mg by mouth daily.    . cyclobenzaprine (FLEXERIL) 10 MG tablet Take 0.5 tablets (5 mg total) by mouth daily as needed for muscle spasms. 30 tablet 1  . diclofenac sodium (VOLTAREN) 1 % GEL APPLY 2 GRAMS TOPICALLY TWICE A DAY AS NEEDED 300 g 1  . DULoxetine (CYMBALTA) 60 MG capsule Take 1 capsule (60 mg total) by mouth daily. 90 capsule 1  . Elderberry 575 MG/5ML SYRP Take 500 mg by mouth 2 (two) times a day.    Marland Kitchen EPINEPHrine (EPIPEN 2-PAK) 0.3 mg/0.3 mL IJ SOAJ injection Inject 0.3 mLs (0.3 mg total) into the muscle once. 1 Device 1  . famotidine (PEPCID) 20 MG tablet Take 1 tablet (20 mg total) by mouth 2 (two) times daily. 90 tablet 3  . fexofenadine (ALLEGRA) 180 MG tablet Take 1 tablet (180 mg total) by mouth daily as needed for allergies or rhinitis. 90 tablet 3  . FIBER PO Take 2 capsules by mouth 2 (two) times daily.    . fluticasone (FLONASE) 50 MCG/ACT nasal spray Place 1 spray into both nostrils daily. 48 g 1  . Misc Natural Products (TART CHERRY ADVANCED) CAPS 1200mg  by mouth twice daily    . Multiple Vitamin (MULTIVITAMIN) tablet Take 1 tablet by mouth daily.    Marland Kitchen omeprazole (PRILOSEC) 40 MG capsule TAKE 1 CAPSULE DAILY 90 capsule 3  . pravastatin (PRAVACHOL) 40  MG tablet Take 1 tablet (40 mg total) by mouth daily. 90 tablet 1  . Probiotic Product (PROBIOTIC DAILY PO) Take 1 capsule by mouth daily.    . vitamin C (ASCORBIC ACID) 500 MG tablet Take 500 mg by mouth daily.     No current facility-administered medications on file prior to visit.    LMP 05/14/1996     Observations/Objective:   Gen: Awake, alert, no acute distress Resp: Breathing is even and non-labored Psych: calm/pleasant demeanor Neuro: Alert and Oriented x 3, + facial symmetry,  speech is clear.   Assessment and Plan:    Sore throat- Does not sound like strep at this point.  She is afebrile.  I do think that she should be tested for COVID-19 and I have given her the information to schedule an appointment.  She is advised to call if new/worsening symptoms and to quarantine pending review of her covid testing.     1.  text "covid" to [phone]    or   2.  Schedule at [email or web address]   or    3.  Call [phone]      Follow Up Instructions:    I discussed the assessment and treatment plan with the patient. The patient was provided an opportunity to ask questions and all were answered. The patient agreed with the plan and demonstrated an understanding of the instructions.   The patient was advised to call back or seek an in-person evaluation if the symptoms worsen or if the condition fails to improve as anticipated.  Nance Pear, NP

## 2019-07-24 ENCOUNTER — Ambulatory Visit: Payer: Medicare Other | Admitting: Family

## 2019-07-25 ENCOUNTER — Ambulatory Visit: Payer: Medicare Other | Admitting: Family

## 2019-07-25 ENCOUNTER — Ambulatory Visit: Payer: Medicare Other | Attending: Internal Medicine

## 2019-07-25 DIAGNOSIS — Z20822 Contact with and (suspected) exposure to covid-19: Secondary | ICD-10-CM | POA: Diagnosis not present

## 2019-07-27 LAB — NOVEL CORONAVIRUS, NAA: SARS-CoV-2, NAA: NOT DETECTED

## 2019-07-29 ENCOUNTER — Other Ambulatory Visit: Payer: Self-pay | Admitting: Family

## 2019-07-30 ENCOUNTER — Encounter: Payer: Self-pay | Admitting: Family

## 2019-07-31 ENCOUNTER — Ambulatory Visit (INDEPENDENT_AMBULATORY_CARE_PROVIDER_SITE_OTHER): Payer: Medicare Other | Admitting: Family Medicine

## 2019-07-31 ENCOUNTER — Encounter: Payer: Self-pay | Admitting: Family Medicine

## 2019-07-31 ENCOUNTER — Ambulatory Visit: Payer: Self-pay

## 2019-07-31 ENCOUNTER — Other Ambulatory Visit: Payer: Self-pay

## 2019-07-31 VITALS — BP 132/79 | HR 81 | Ht 64.0 in | Wt 160.0 lb

## 2019-07-31 DIAGNOSIS — M79671 Pain in right foot: Secondary | ICD-10-CM

## 2019-07-31 DIAGNOSIS — M7751 Other enthesopathy of right foot: Secondary | ICD-10-CM | POA: Insufficient documentation

## 2019-07-31 NOTE — Assessment & Plan Note (Addendum)
Possibly having stress reaction with the amount of steps that she takes in the various tasks that she has to do throughout the course the day.  Seems less likely for peroneal tendinitis.  Does not appear to be related to the degenerative changes observed.  May have a component of capsulitis as well.  Less likely for inflammatory origin. - post op shoe -Counseled on supportive care. -Could consider imaging on prednisone.

## 2019-07-31 NOTE — Progress Notes (Signed)
Vanessa Oliver - 69 y.o. female MRN [id number]  Date of birth: 02/05/1951  SUBJECTIVE:  Including CC & ROS.  Chief Complaint  Patient presents with  . Foot Pain    right foot x 2 weeks    Vanessa Oliver is a 69 y.o. female that is presenting with right foot pain.  The pain is been ongoing for a few weeks.  The pain is localized to the lateral aspect of the midfoot and forefoot.  No redness over this area.  Denies any specific inciting event.  She does accumulate greater than 6000 steps per day.  She has to help her husband with his ADLs.  No numbness or tingling.  Has had stress fractures in the past.   Review of Systems See HPI   HISTORY: Past Medical, Surgical, Social, and Family History Reviewed & Updated per EMR.   Pertinent Historical Findings include:  Past Medical History:  Diagnosis Date  . Allergy    takes Singulair at night  . Anxiety   . Arthritis   . Cataract   . Chronic back pain    stenosis  . Depression    takes CYmbalta daily  . Esophageal stricture   . Family history of adverse reaction to anesthesia    oldest brother had trouble with anesthesia a long time ago but can't recall what  . Fibromyalgia   . Fibromyalgia   . GERD (gastroesophageal reflux disease)    takes Omeprazole daily  . Hemorrhoids   . History of bronchitis   . Hyperlipidemia   . IBS (irritable bowel syndrome)   . Joint pain   . Joint swelling   . Plantar fasciitis   . Rosacea   . Rosacea   . Sleep apnea    cpap- settings at 10 breathing   . TMJ (dislocation of temporomandibular joint)   . TMJ (temporomandibular joint disorder)   . Vasovagal episode back in the 80's  . Vasovagal syncope   . Weakness    right leg    Past Surgical History:  Procedure Laterality Date  . ABDOMINAL HYSTERECTOMY  1997  . CARPAL TUNNEL RELEASE Bilateral 2004  . CARPOMETACARPAL (CMC) FUSION OF THUMB  2015   thumb  . CATARACT EXTRACTION Right 2013   Dr Ellison Hughs  . CATARACT EXTRACTION Left  04/2016  . COLONOSCOPY    . COLONOSCOPY WITH ESOPHAGOGASTRODUODENOSCOPY (EGD) AND ESOPHAGEAL DILATION (ED)    . EYE SURGERY     scar tissue removed after cataract surgery  . 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 KNEE ARTHROSCOPY Right 1994   x 3  . KNEE SURGERY Right 01/21/2006  . LUMBAR LAMINECTOMY/DECOMPRESSION MICRODISCECTOMY Right 02/11/2015   Procedure: Laminectomy and Foraminotomy - Right - L3-L4;  Surgeon: Earnie Larsson, MD;  Location: [facility];  Service: Neurosurgery;  Laterality: Right;  Laminectomy and Foraminotomy - Right - L3-L4  . mallet finger Right 2011  . NASAL SEPTUM SURGERY  1970  . NISSEN FUNDOPLICATION  0000000  . RETINAL LASER PROCEDURE Right 02/01/2011   right eye   . TONSILLECTOMY  1981  . TOTAL KNEE ARTHROPLASTY Right 09/06/2016   Procedure: RIGHT TOTAL KNEE ARTHROPLASTY;  Surgeon: Gaynelle Arabian, MD;  Location: [facility];  Service: Orthopedics;  Laterality: Right;  . TRIGGER FINGER RELEASE Bilateral 2005  . UPPER GASTROINTESTINAL ENDOSCOPY    . VARICOSE VEIN SURGERY Bilateral 2007, 2009   left 2007, right 2009  . VITRECTOMY Right 2012    Allergies  Allergen Reactions  . Aleve [Naproxen Sodium]     Swelling, itching  . Cefuroxime Axetil     Hives / "throat swelling"  . Chlorzoxazone Rash and Other (See Comments)    "breathing problems"   . Oxycodone     Hallucinations, rash  . Penicillins Anaphylaxis and Hives    Has patient had a PCN reaction causing immediate rash, facial/tongue/throat swelling, SOB or lightheadedness with hypotension: Yes Has patient had a PCN reaction causing severe rash involving mucus membranes or skin necrosis: No Has patient had a PCN reaction that required hospitalization No Has patient had a PCN reaction occurring within the last 10 years: No If all of the above answers are "NO", then may proceed with Cephalosporin use.   . Adhesive [Tape]     rash  . Cataflam [Diclofenac Potassium]     Lip swelling  . Clarithromycin  Diarrhea  . Flagyl [Metronidazole Hcl]     ?diarrhea  . Fluzone [Flu Virus Vaccine]     Had local redness with high dose.   . Glucosamine Hives  . Guaifenesin & Derivatives     Stomach upset  . Hydrocodone-Acetaminophen     REACTION: rash, tightening of throat  . Ketorolac Tromethamine     ?reaction  . Pentazocine     Involuntary twitching  . Pneumovax [Pneumococcal Polysaccharide Vaccine] Other (See Comments)    Redness/swelling at site, nausea  . Prevnar [Pneumococcal 13-Val Conj Vacc] Other (See Comments)    Swelling, redness, nausea  . Smz-Tmp Ds [Sulfamethoxazole W/Trimethoprim (Co-Trimoxazole)]     ?unknown reaction  . Tramadol     constipation  . Trazodone And Nefazodone Other (See Comments)    Scratchy throat / congestion  . Latex Rash    Family History  Problem Relation Age of Onset  . Breast cancer Mother   . Arthritis Mother   . Hyperlipidemia Mother   . Hypertension Mother   . Allergic rhinitis Mother   . Urticaria Mother   . Lung cancer Father   . Pancreatic cancer Father   . Skin cancer Father   . Arthritis Father   . Allergic rhinitis Father   . Emphysema Father   . Lymphoma Brother   . Celiac disease Brother   . Hypertension Brother   . Thyroid disease Brother   . Allergic rhinitis Brother   . Hyperlipidemia Maternal Grandfather   . Heart disease Maternal Grandfather   . Stroke Maternal Grandfather   . Eczema Maternal Aunt   . Asthma Neg Hx   . Immunodeficiency Neg Hx   . Angioedema Neg Hx   . Colon cancer Neg Hx   . Colon polyps Neg Hx   . Esophageal cancer Neg Hx   . Rectal cancer Neg Hx   . Stomach cancer Neg Hx      Social History   Socioeconomic History  . Marital status: Married    Spouse name: Not on file  . Number of children: 0  . Years of education: Not on file  . Highest education level: Not on file  Occupational History  . 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 Smoking status: Never Smoker  .  Smokeless tobacco: Never Used  Substance and Sexual Activity  . Alcohol use: Yes    Alcohol/week: 0.0 standard drinks    Comment: rarely  . Drug use: No  . Sexual activity: Never    Birth control/protection: Surgical  Other Topics Concern  . Not on  file  Social History Narrative   Works as an Glass blower/designer   Married- husband is 46 years older than her   Former Dance movement psychotherapist up up [employer]   Has cats   Enjoys reading   No children   Social Determinants of Radio broadcast assistant Strain:   . Difficulty of Paying Living Expenses: Not on file  Food Insecurity:   . Worried About Charity fundraiser in the Last Year: Not on file  . Ran Out of Food in the Last Year: Not on file  Transportation Needs:   . Lack of Transportation (Medical): Not on file  . Lack of Transportation (Non-Medical): Not on file  Physical Activity:   . Days of Exercise per Week: Not on file  . Minutes of Exercise per Session: Not on file  Stress:   . Feeling of Stress : Not on file  Social Connections:   . Frequency of Communication with Friends and Family: Not on file  . Frequency of Social Gatherings with Friends and Family: Not on file  . Attends Religious Services: Not on file  . Active Member of Clubs or Organizations: Not on file  . Attends Archivist Meetings: Not on file  . Marital Status: Not on file  Intimate Partner Violence:   . Fear of Current or Ex-Partner: Not on file  . Emotionally Abused: Not on file  . Physically Abused: Not on file  . Sexually Abused: Not on file     PHYSICAL EXAM:  VS: BP 132/79   Pulse 81   Ht 5\' 4"  (1.626 m)   Wt 160 lb (72.6 kg)   LMP 05/14/1996   BMI 27.46 kg/m  Physical Exam Gen: NAD, alert, cooperative with exam, well-appearing ENT: normal lips, normal nasal mucosa,  Eye: normal EOM, normal conjunctiva and lids Skin: no rashes, no areas of induration  Neuro: normal tone, normal sensation to touch Psych:  normal insight, alert and  oriented MSK:  Right foot: No obvious redness or swelling TTP along the fifth and third MCP joints. Some tenderness along the fifth metatarsal. Normal range of motion and normal strength resistance. Neurovascularly intact  Limited ultrasound: Left foot:  No obvious structural integrity change of the fifth metatarsal shaft or base.  No changes of the fifth MTP joint. Normal-appearing peroneal tendons and static and dynamic testing. There is a hypoechoic effusion just inferior to the peroneal tendons near the lateral malleolus. Some degenerative changes observed between the fourth and fifth base of the metatarsals  Summary: Possible stress reaction with no identified actual stress fracture.  Ultrasound and interpretation by Clearance Coots, MD   ASSESSMENT & PLAN:   Right foot pain Possibly having stress reaction with the amount of steps that she takes in the various tasks that she has to do throughout the course the day.  Seems less likely for peroneal tendinitis.  Does not appear to be related to the degenerative changes observed.  May have a component of capsulitis as well.  Less likely for inflammatory origin. - post op shoe -Counseled on supportive care. -Could consider imaging on prednisone.

## 2019-07-31 NOTE — Patient Instructions (Signed)
Good to see you Please try ice  Please try the device for 2-3 weeks   Please send me a message in MyChart with any questions or updates.  Please see me back in 4 weeks.   --Dr. Raeford Razor

## 2019-08-05 ENCOUNTER — Encounter: Payer: Self-pay | Admitting: Family

## 2019-08-07 ENCOUNTER — Ambulatory Visit: Payer: Medicare Other | Admitting: Family

## 2019-08-07 ENCOUNTER — Encounter: Payer: Self-pay | Admitting: Family

## 2019-08-07 ENCOUNTER — Ambulatory Visit (INDEPENDENT_AMBULATORY_CARE_PROVIDER_SITE_OTHER): Payer: Medicare Other | Admitting: Family

## 2019-08-07 DIAGNOSIS — R5383 Other fatigue: Secondary | ICD-10-CM

## 2019-08-07 DIAGNOSIS — M545 Low back pain, unspecified: Secondary | ICD-10-CM

## 2019-08-07 NOTE — Progress Notes (Signed)
Virtual Visit via Video Note  I connected with [name] on 08/07/19 at  2:20 PM EST by a video enabled telemedicine application and verified that I am speaking with the correct person using two identifiers.  Location: Patient: home Provider: work   I discussed the limitations of evaluation and management by telemedicine and the availability of in person appointments. The patient expressed understanding and agreed to proceed.  History of Present Illness:  Patient is a 69 yr old female who presents today with c/o fatigue.  She reports feeling very tired since November.  Reports that it started around the time that her husband was hospitalized.  He was In ICU, then in a stepdown, then rehab until 12/19.  She reports that since he has been home, she has been doing much more for him around the house.  She has 2 help him with all of his ADLs as his mobility is not good at this time.  She also cares for her elderly mother.  She has hired someone to start cleaning the house.  She is hopeful that this will take some pressure off of her.  She reports good compliance with her CPAP machine.  Denies any leak of her CPAP.  She also reports right sided low back pain. Reports that it radiates around to her abdomen. Reports that her back was hurting even before her husband was hospitalized. Has been bending/stooping more recently.  She had an ESI from spine specialist a few months back.    She is following with Dr. Raeford Razor for foot/ankle pain.      Observations/Objective:   Gen: Awake, alert, no acute distress Resp: Breathing is even and non-labored Psych: calm/pleasant demeanor Neuro: Alert and Oriented x 3, + facial symmetry, speech is clear.   Assessment and Plan:  Low back pain- discussed use of Tylenol as, heating pad as needed, Salonpas patches as needed.  Also recommended stretching.  Fatigue-I suspect this is due to stress and her recent increase activity in caring for her husband.   We discussed trying to set limits for herself and not to overdo it.  I will order a c-Met, CBC, and TSH.  She will schedule a lab appointment her notes.  She is encouraged to continue using her CPAP.   Follow Up Instructions:    I discussed the assessment and treatment plan with the patient. The patient was provided an opportunity to ask questions and all were answered. The patient agreed with the plan and demonstrated an understanding of the instructions.   The patient was advised to call back or seek an in-person evaluation if the symptoms worsen or if the condition fails to improve as anticipated.  Nance Pear, NP

## 2019-08-08 ENCOUNTER — Other Ambulatory Visit: Payer: Self-pay

## 2019-08-08 ENCOUNTER — Telehealth: Payer: Self-pay | Admitting: Family Medicine

## 2019-08-08 MED ORDER — DULOXETINE HCL 60 MG PO CPEP: 60.0000 mg | ORAL_CAPSULE | Freq: Every day | ORAL | 1 refills | Status: DC

## 2019-08-08 MED ORDER — PRAVASTATIN SODIUM 40 MG PO TABS: 40.0000 mg | ORAL_TABLET | Freq: Every day | ORAL | 1 refills | Status: DC

## 2019-08-08 NOTE — Telephone Encounter (Signed)
Patient called regarding her right foot. She has been wearing the post op shoe as instructed but is experiencing worsening pain.  Patient asked if an increase in pain is expected before getting better.   Asking for a call back at [phone]

## 2019-08-09 NOTE — Telephone Encounter (Signed)
Talked with patient about pain. Will try add cushioning to her post op shoe.   Rosemarie Ax, MD [facility] 08/09/2019, 8:44 AM

## 2019-08-10 ENCOUNTER — Ambulatory Visit: Payer: Medicare Other

## 2019-08-15 ENCOUNTER — Other Ambulatory Visit (INDEPENDENT_AMBULATORY_CARE_PROVIDER_SITE_OTHER): Payer: Medicare Other

## 2019-08-15 ENCOUNTER — Other Ambulatory Visit: Payer: Self-pay

## 2019-08-15 DIAGNOSIS — R5383 Other fatigue: Secondary | ICD-10-CM

## 2019-08-15 LAB — CBC WITH DIFFERENTIAL/PLATELET
Basophils Absolute: 0.1 10*3/uL (ref 0.0–0.1)
Basophils Relative: 1 % (ref 0.0–3.0)
Eosinophils Absolute: 0.2 10*3/uL (ref 0.0–0.7)
Eosinophils Relative: 2.4 % (ref 0.0–5.0)
HCT: 44.5 % (ref 36.0–46.0)
Hemoglobin: 14.7 g/dL (ref 12.0–15.0)
Lymphocytes Relative: 33.4 % (ref 12.0–46.0)
Lymphs Abs: 2.1 10*3/uL (ref 0.7–4.0)
MCHC: 33 g/dL (ref 30.0–36.0)
MCV: 94.2 fl (ref 78.0–100.0)
Monocytes Absolute: 0.6 10*3/uL (ref 0.1–1.0)
Monocytes Relative: 9.6 % (ref 3.0–12.0)
Neutro Abs: 3.4 10*3/uL (ref 1.4–7.7)
Neutrophils Relative %: 53.6 % (ref 43.0–77.0)
Platelets: 314 10*3/uL (ref 150.0–400.0)
RBC: 4.72 Mil/uL (ref 3.87–5.11)
RDW: 13.4 % (ref 11.5–15.5)
WBC: 6.4 10*3/uL (ref 4.0–10.5)

## 2019-08-15 LAB — TSH: TSH: 1.37 u[IU]/mL (ref 0.35–4.50)

## 2019-08-15 LAB — COMPREHENSIVE METABOLIC PANEL
ALT: 13 U/L (ref 0–35)
AST: 15 U/L (ref 0–37)
Albumin: 4.5 g/dL (ref 3.5–5.2)
Alkaline Phosphatase: 76 U/L (ref 39–117)
BUN: 16 mg/dL (ref 6–23)
CO2: 30 mEq/L (ref 19–32)
Calcium: 9.9 mg/dL (ref 8.4–10.5)
Chloride: 101 mEq/L (ref 96–112)
Creatinine, Ser: 0.66 mg/dL (ref 0.40–1.20)
GFR: 88.51 mL/min (ref >60.00)
Glucose, Bld: 86 mg/dL (ref 70–99)
Potassium: 4.6 mEq/L (ref 3.5–5.1)
Sodium: 138 mEq/L (ref 135–145)
Total Bilirubin: 0.6 mg/dL (ref 0.2–1.2)
Total Protein: 6.8 g/dL (ref 6.0–8.3)

## 2019-08-16 DIAGNOSIS — F4310 Post-traumatic stress disorder, unspecified: Secondary | ICD-10-CM | POA: Diagnosis not present

## 2019-08-18 ENCOUNTER — Ambulatory Visit: Payer: Medicare Other | Attending: Internal Medicine

## 2019-08-18 DIAGNOSIS — Z23 Encounter for immunization: Secondary | ICD-10-CM | POA: Insufficient documentation

## 2019-08-18 NOTE — Progress Notes (Signed)
   Covid-19 Vaccination Clinic  Name:  Vanessa Oliver    MRN: [id number] DOB: 01/02/1951  08/18/2019  [name] was observed post Covid-19 immunization for 30 minutes based on pre-vaccination screening without incidence. She was provided with Vaccine Information Sheet and instruction to access the V-Safe system.   [name] was instructed to call 911 with any severe reactions post vaccine: Marland Kitchen Difficulty breathing  . Swelling of your face and throat  . A fast heartbeat  . A bad rash all over your body  . Dizziness and weakness    Immunizations Administered    Name Date Dose VIS Date Route   Pfizer COVID-19 Vaccine 08/18/2019  4:07 PM 0.3 mL 06/22/2019 Intramuscular   Manufacturer: [place]   Lot: [id number]   [place]: [id number]

## 2019-08-21 ENCOUNTER — Ambulatory Visit: Payer: Medicare Other

## 2019-08-28 ENCOUNTER — Ambulatory Visit (HOSPITAL_BASED_OUTPATIENT_CLINIC_OR_DEPARTMENT_OTHER)
Admission: RE | Admit: 2019-08-28 | Discharge: 2019-08-28 | Disposition: A | Discharge status: Home / Self Care | Payer: Medicare Other | Source: Ambulatory Visit | Attending: Family Medicine | Admitting: Family Medicine

## 2019-08-28 ENCOUNTER — Encounter: Payer: Self-pay | Admitting: Family Medicine

## 2019-08-28 ENCOUNTER — Ambulatory Visit (INDEPENDENT_AMBULATORY_CARE_PROVIDER_SITE_OTHER): Payer: Medicare Other | Admitting: Family Medicine

## 2019-08-28 ENCOUNTER — Other Ambulatory Visit: Payer: Self-pay

## 2019-08-28 VITALS — BP 137/76 | HR 83 | Ht 64.0 in | Wt 160.0 lb

## 2019-08-28 DIAGNOSIS — M7751 Other enthesopathy of right foot: Secondary | ICD-10-CM

## 2019-08-28 DIAGNOSIS — M79671 Pain in right foot: Secondary | ICD-10-CM | POA: Diagnosis not present

## 2019-08-28 NOTE — Assessment & Plan Note (Signed)
It appears more of the pain is associated with a bunionette and likely capsulitis of the fifth MCP joint as opposed to a stress reaction. -Can restart wearing a postop shoe.  Would consider custom orthotics once the pain has improved. -Counseled on supportive care. -Could consider injection.

## 2019-08-28 NOTE — Progress Notes (Signed)
Vanessa Oliver - 69 y.o. female MRN [id number]  Date of birth: 1951-03-28  SUBJECTIVE:  Including CC & ROS.  Chief Complaint  Patient presents with  . Follow-up    follow up for right foot    Vanessa Oliver is a 69 y.o. female that is having acute worsening of her right foot pain.  It seems to be localized to the lateral aspect of the fifth MTP joint.  She did get improvement with the postop shoe but had exacerbation while she was wearing a regular tennis shoe.  Seems to be worse when she has prolonged standing.   Review of Systems See HPI   HISTORY: Past Medical, Surgical, Social, and Family History Reviewed & Updated per EMR.   Pertinent Historical Findings include:  Past Medical History:  Diagnosis Date  . Allergy    takes Singulair at night  . Anxiety   . Arthritis   . Cataract   . Chronic back pain    stenosis  . Depression    takes CYmbalta daily  . Esophageal stricture   . Family history of adverse reaction to anesthesia    oldest brother had trouble with anesthesia a long time ago but can't recall what  . Fibromyalgia   . Fibromyalgia   . GERD (gastroesophageal reflux disease)    takes Omeprazole daily  . Hemorrhoids   . History of bronchitis   . Hyperlipidemia   . IBS (irritable bowel syndrome)   . Joint pain   . Joint swelling   . Plantar fasciitis   . Rosacea   . Rosacea   . Sleep apnea    cpap- settings at 10 breathing   . TMJ (dislocation of temporomandibular joint)   . TMJ (temporomandibular joint disorder)   . Vasovagal episode back in the 80's  . Vasovagal syncope   . Weakness    right leg    Past Surgical History:  Procedure Laterality Date  . ABDOMINAL HYSTERECTOMY  1997  . CARPAL TUNNEL RELEASE Bilateral 2004  . CARPOMETACARPAL (CMC) FUSION OF THUMB  2015   thumb  . CATARACT EXTRACTION Right 2013   Dr Ellison Hughs  . CATARACT EXTRACTION Left 04/2016  . COLONOSCOPY    . COLONOSCOPY WITH ESOPHAGOGASTRODUODENOSCOPY (EGD) AND ESOPHAGEAL  DILATION (ED)    . EYE SURGERY     scar tissue removed after cataract surgery  . 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 KNEE ARTHROSCOPY Right 1994   x 3  . KNEE SURGERY Right 01/21/2006  . LUMBAR LAMINECTOMY/DECOMPRESSION MICRODISCECTOMY Right 02/11/2015   Procedure: Laminectomy and Foraminotomy - Right - L3-L4;  Surgeon: Earnie Larsson, MD;  Location: [facility];  Service: Neurosurgery;  Laterality: Right;  Laminectomy and Foraminotomy - Right - L3-L4  . mallet finger Right 2011  . NASAL SEPTUM SURGERY  1970  . NISSEN FUNDOPLICATION  0000000  . RETINAL LASER PROCEDURE Right 02/01/2011   right eye   . TONSILLECTOMY  1981  . TOTAL KNEE ARTHROPLASTY Right 09/06/2016   Procedure: RIGHT TOTAL KNEE ARTHROPLASTY;  Surgeon: Gaynelle Arabian, MD;  Location: [facility];  Service: Orthopedics;  Laterality: Right;  . TRIGGER FINGER RELEASE Bilateral 2005  . UPPER GASTROINTESTINAL ENDOSCOPY    . VARICOSE VEIN SURGERY Bilateral 2007, 2009   left 2007, right 2009  . VITRECTOMY Right 2012    Family History  Problem Relation Age of Onset  . Breast cancer Mother   . Arthritis Mother   . Hyperlipidemia Mother   .  Hypertension Mother   . Allergic rhinitis Mother   . Urticaria Mother   . Lung cancer Father   . Pancreatic cancer Father   . Skin cancer Father   . Arthritis Father   . Allergic rhinitis Father   . Emphysema Father   . Lymphoma Brother   . Celiac disease Brother   . Hypertension Brother   . Thyroid disease Brother   . Allergic rhinitis Brother   . Hyperlipidemia Maternal Grandfather   . Heart disease Maternal Grandfather   . Stroke Maternal Grandfather   . Eczema Maternal Aunt   . Asthma Neg Hx   . Immunodeficiency Neg Hx   . Angioedema Neg Hx   . Colon cancer Neg Hx   . Colon polyps Neg Hx   . Esophageal cancer Neg Hx   . Rectal cancer Neg Hx   . Stomach cancer Neg Hx     Social History   Socioeconomic History  . Marital status: Married    Spouse name: Not on file   . Number of children: 0  . Years of education: Not on file  . Highest education level: Not on file  Occupational History  . Occupation: Hydrologist: [employer]  Tobacco Use  . Smoking status: Never Smoker  . Smokeless tobacco: Never Used  Substance and Sexual Activity  . Alcohol use: Yes    Alcohol/week: 0.0 standard drinks    Comment: rarely  . Drug use: No  . Sexual activity: Never    Birth control/protection: Surgical  Other Topics Concern  . Not on file  Social History Narrative   Works as an Glass blower/designer   Married- husband is 49 years older than her   Former Dance movement psychotherapist up up [employer]   Has cats   Enjoys reading   No children   Social Determinants of Radio broadcast assistant Strain:   . Difficulty of Paying Living Expenses: Not on file  Food Insecurity:   . Worried About Charity fundraiser in the Last Year: Not on file  . Ran Out of Food in the Last Year: Not on file  Transportation Needs:   . Lack of Transportation (Medical): Not on file  . Lack of Transportation (Non-Medical): Not on file  Physical Activity:   . Days of Exercise per Week: Not on file  . Minutes of Exercise per Session: Not on file  Stress:   . Feeling of Stress : Not on file  Social Connections:   . Frequency of Communication with Friends and Family: Not on file  . Frequency of Social Gatherings with Friends and Family: Not on file  . Attends Religious Services: Not on file  . Active Member of Clubs or Organizations: Not on file  . Attends Archivist Meetings: Not on file  . Marital Status: Not on file  Intimate Partner Violence:   . Fear of Current or Ex-Partner: Not on file  . Emotionally Abused: Not on file  . Physically Abused: Not on file  . Sexually Abused: Not on file     PHYSICAL EXAM:  VS: BP 137/76   Pulse 83   Ht 5\' 4"  (1.626 m)   Wt 160 lb (72.6 kg)   LMP 05/14/1996   BMI 27.46 kg/m  Physical Exam Gen: NAD, alert, cooperative with exam,  well-appearing MSK:  Right foot: Mild bunionette has formed. Tenderness to palpation over the lateral aspect of the fifth MTP joint. Neurovascular intact  ASSESSMENT & PLAN:   Capsulitis of metatarsophalangeal (MTP) joint of right foot It appears more of the pain is associated with a bunionette and likely capsulitis of the fifth MCP joint as opposed to a stress reaction. -Can restart wearing a postop shoe.  Would consider custom orthotics once the pain has improved. -Counseled on supportive care. -Could consider injection.

## 2019-08-28 NOTE — Patient Instructions (Signed)
Good to see you  Please wean out of the post op shoe as you tolerate  Please try ice, heat or voltaren  I will call with the results  Please send me a message in MyChart with any questions or updates.  Please see [place] back to have the orthotics made when your pain has improved.   --Dr. Raeford Razor

## 2019-08-29 ENCOUNTER — Telehealth: Payer: Self-pay | Admitting: Family Medicine

## 2019-08-29 NOTE — Telephone Encounter (Signed)
Informed of results.   Rosemarie Ax, MD [facility] 08/29/2019, 10:34 AM

## 2019-08-30 ENCOUNTER — Encounter: Payer: Self-pay | Admitting: Family

## 2019-08-30 DIAGNOSIS — F4310 Post-traumatic stress disorder, unspecified: Secondary | ICD-10-CM | POA: Diagnosis not present

## 2019-09-06 DIAGNOSIS — M25562 Pain in left knee: Secondary | ICD-10-CM | POA: Diagnosis not present

## 2019-09-06 DIAGNOSIS — Z96651 Presence of right artificial knee joint: Secondary | ICD-10-CM | POA: Diagnosis not present

## 2019-09-12 ENCOUNTER — Encounter: Payer: Self-pay | Admitting: Family

## 2019-09-12 ENCOUNTER — Ambulatory Visit: Payer: Medicare Other | Attending: Internal Medicine

## 2019-09-12 DIAGNOSIS — Z23 Encounter for immunization: Secondary | ICD-10-CM

## 2019-09-12 MED ORDER — CYCLOBENZAPRINE HCL 10 MG PO TABS: 5.0000 mg | ORAL_TABLET | Freq: Every day | ORAL | 1 refills | Status: DC | PRN

## 2019-09-12 NOTE — Progress Notes (Signed)
   Covid-19 Vaccination Clinic  Name:  Vanessa Oliver    MRN: [id number] DOB: August 12, 1950  09/12/2019  [name] was observed post Covid-19 immunization for 15 minutes without incident. She was provided with Vaccine Information Sheet and instruction to access the V-Safe system.   [name] was instructed to call 911 with any severe reactions post vaccine: Marland Kitchen Difficulty breathing  . Swelling of face and throat  . A fast heartbeat  . A bad rash all over body  . Dizziness and weakness   Immunizations Administered    Name Date Dose VIS Date Route   Pfizer COVID-19 Vaccine 09/12/2019  2:31 PM 0.3 mL 06/22/2019 Intramuscular   Manufacturer: [place]   Lot: [id number]   [place]: [id number]

## 2019-09-13 ENCOUNTER — Encounter: Payer: Self-pay | Admitting: Family

## 2019-09-13 DIAGNOSIS — F4310 Post-traumatic stress disorder, unspecified: Secondary | ICD-10-CM | POA: Diagnosis not present

## 2019-09-14 MED ORDER — SHINGRIX 50 MCG/0.5ML IM SUSR: INTRAMUSCULAR | 1 refills | Status: AC

## 2019-09-18 DIAGNOSIS — M25562 Pain in left knee: Secondary | ICD-10-CM | POA: Diagnosis not present

## 2019-09-21 DIAGNOSIS — M25562 Pain in left knee: Secondary | ICD-10-CM | POA: Diagnosis not present

## 2019-09-26 ENCOUNTER — Telehealth: Payer: Self-pay | Admitting: Family

## 2019-09-26 NOTE — Chronic Care Management (AMB) (Signed)
  Chronic Care Management   Note  09/26/2019 Name: Vanessa Oliver MRN: [id number] DOB: [birth year]  Vanessa Oliver is a 69 y.o. year old female who is a primary care patient of Debbrah Alar, NP. I reached out to [name] by phone today in response to a referral sent by [name] PCP, Debbrah Alar, NP. Husband, Lanny Hurst gave verbal consent for CCM.  Vanessa Oliver was given information about Chronic Care Management services today including:  1. CCM service includes personalized support from designated clinical staff supervised by her physician, including individualized plan of care and coordination with other care providers 2. 24/7 contact phone numbers for assistance for urgent and routine care needs. 3. Service will only be billed when office clinical staff spend 20 minutes or more in a month to coordinate care. 4. Only one practitioner may furnish and bill the service in a calendar month. 5. The patient may stop CCM services at any time (effective at the end of the month) by phone call to the office staff.   Patient agreed to services and verbal consent obtained.   Follow up plan:   Raynicia Dukes [employer] Scheduler

## 2019-09-27 DIAGNOSIS — F4310 Post-traumatic stress disorder, unspecified: Secondary | ICD-10-CM | POA: Diagnosis not present

## 2019-10-18 ENCOUNTER — Telehealth: Payer: Medicare Other

## 2019-10-30 DIAGNOSIS — M23322 Other meniscus derangements, posterior horn of medial meniscus, left knee: Secondary | ICD-10-CM | POA: Diagnosis not present

## 2019-10-30 DIAGNOSIS — M94262 Chondromalacia, left knee: Secondary | ICD-10-CM | POA: Diagnosis not present

## 2019-11-08 ENCOUNTER — Other Ambulatory Visit: Payer: Self-pay | Admitting: Family

## 2019-11-15 DIAGNOSIS — F4310 Post-traumatic stress disorder, unspecified: Secondary | ICD-10-CM | POA: Diagnosis not present

## 2019-11-29 DIAGNOSIS — F4310 Post-traumatic stress disorder, unspecified: Secondary | ICD-10-CM | POA: Diagnosis not present

## 2019-12-13 DIAGNOSIS — F4310 Post-traumatic stress disorder, unspecified: Secondary | ICD-10-CM | POA: Diagnosis not present

## 2019-12-27 DIAGNOSIS — F4310 Post-traumatic stress disorder, unspecified: Secondary | ICD-10-CM | POA: Diagnosis not present

## 2019-12-31 ENCOUNTER — Encounter: Payer: Self-pay | Admitting: Family

## 2020-01-01 DIAGNOSIS — M6281 Muscle weakness (generalized): Secondary | ICD-10-CM | POA: Diagnosis not present

## 2020-01-01 DIAGNOSIS — M25562 Pain in left knee: Secondary | ICD-10-CM | POA: Diagnosis not present

## 2020-01-01 DIAGNOSIS — Z471 Aftercare following joint replacement surgery: Secondary | ICD-10-CM | POA: Diagnosis not present

## 2020-01-04 DIAGNOSIS — M6281 Muscle weakness (generalized): Secondary | ICD-10-CM | POA: Diagnosis not present

## 2020-01-04 DIAGNOSIS — Z471 Aftercare following joint replacement surgery: Secondary | ICD-10-CM | POA: Diagnosis not present

## 2020-01-04 DIAGNOSIS — M25562 Pain in left knee: Secondary | ICD-10-CM | POA: Diagnosis not present

## 2020-01-08 ENCOUNTER — Encounter: Payer: Self-pay | Admitting: Family

## 2020-01-08 ENCOUNTER — Other Ambulatory Visit: Payer: Self-pay

## 2020-01-08 ENCOUNTER — Ambulatory Visit (INDEPENDENT_AMBULATORY_CARE_PROVIDER_SITE_OTHER): Payer: Medicare Other | Admitting: Family

## 2020-01-08 VITALS — BP 122/68 | HR 81 | Temp 98.4°F | Resp 16 | Ht 64.0 in | Wt 158.0 lb

## 2020-01-08 DIAGNOSIS — M25562 Pain in left knee: Secondary | ICD-10-CM | POA: Diagnosis not present

## 2020-01-08 DIAGNOSIS — Z471 Aftercare following joint replacement surgery: Secondary | ICD-10-CM | POA: Diagnosis not present

## 2020-01-08 DIAGNOSIS — E785 Hyperlipidemia, unspecified: Secondary | ICD-10-CM

## 2020-01-08 DIAGNOSIS — R1310 Dysphagia, unspecified: Secondary | ICD-10-CM | POA: Diagnosis not present

## 2020-01-08 DIAGNOSIS — M6281 Muscle weakness (generalized): Secondary | ICD-10-CM | POA: Diagnosis not present

## 2020-01-08 MED ORDER — ROSUVASTATIN CALCIUM 5 MG PO TABS: 5.0000 mg | ORAL_TABLET | Freq: Every day | ORAL | 1 refills | Status: DC

## 2020-01-08 NOTE — Progress Notes (Signed)
Subjective:    Patient ID: Vanessa Oliver, female    DOB: [birth year], 69 y.o.   MRN: [id number]  HPI  Patient is a 69 yr old female who presents to discuss a concern with pravastatin.  She reached out to me last week with concern that pravastatin was causing nausea. She also had concerns about difficulty swallowing.  I advised her to hold pravastatin and see me in the office.  Lab Results  Component Value Date   CHOL 173 05/23/2018   HDL 43.70 05/23/2018   LDLCALC 110 (H) 05/23/2018   TRIG 100.0 05/23/2018   CHOLHDL 4 05/23/2018   BP Readings from Last 3 Encounters:  01/08/20 122/68  08/28/19 137/76  07/31/19 132/79   Hx of esophageal stenosis- Last esophageal dilatation was 9/19.  Reports that she has trouble swallowing certain pills. Able to swallow food without difficulty.   Review of Systems See HPI  Past Medical History:  Diagnosis Date  . Allergy    takes Singulair at night  . Anxiety   . Arthritis   . Cataract   . Chronic back pain    stenosis  . Depression    takes CYmbalta daily  . Esophageal stricture   . Family history of adverse reaction to anesthesia    oldest brother had trouble with anesthesia a long time ago but can't recall what  . Fibromyalgia   . Fibromyalgia   . GERD (gastroesophageal reflux disease)    takes Omeprazole daily  . Hemorrhoids   . History of bronchitis   . Hyperlipidemia   . IBS (irritable bowel syndrome)   . Joint pain   . Joint swelling   . Plantar fasciitis   . Rosacea   . Rosacea   . Sleep apnea    cpap- settings at 10 breathing   . TMJ (dislocation of temporomandibular joint)   . TMJ (temporomandibular joint disorder)   . Vasovagal episode back in the 80's  . Vasovagal syncope   . Weakness    right leg     Social History   Socioeconomic History  . Marital status: Married    Spouse name: Not on file  . Number of children: 0  . Years of education: Not on file  . Highest education level: Not on file   Occupational History  . Occupation: Hydrologist: [employer]  Tobacco Use  . Smoking status: Never Smoker  . Smokeless tobacco: Never Used  Vaping Use  . Vaping Use: Never used  Substance and Sexual Activity  . Alcohol use: Yes    Alcohol/week: 0.0 standard drinks    Comment: rarely  . Drug use: No  . Sexual activity: Never    Birth control/protection: Surgical  Other Topics Concern  . Not on file  Social History Narrative   Works as an Glass blower/designer   Married- husband is 69 years older than her   Former Dance movement psychotherapist up up [employer]   Has cats   Enjoys reading   No children   Social Determinants of Radio broadcast assistant Strain:   . Difficulty of Paying Living Expenses:   Food Insecurity:   . Worried About Charity fundraiser in the Last Year:   . Arboriculturist in the Last Year:   Transportation Needs:   . Film/video editor (Medical):   Marland Kitchen Lack of Transportation (Non-Medical):   Physical Activity:   . Days of Exercise per Week:   .  Minutes of Exercise per Session:   Stress:   . Feeling of Stress :   Social Connections:   . Frequency of Communication with Friends and Family:   . Frequency of Social Gatherings with Friends and Family:   . Attends Religious Services:   . Active Member of Clubs or Organizations:   . Attends Archivist Meetings:   Marland Kitchen Marital Status:   Intimate Partner Violence:   . Fear of Current or Ex-Partner:   . Emotionally Abused:   Marland Kitchen Physically Abused:   . Sexually Abused:     Past Surgical History:  Procedure Laterality Date  . ABDOMINAL HYSTERECTOMY  1997  . CARPAL TUNNEL RELEASE Bilateral 2004  . CARPOMETACARPAL (CMC) FUSION OF THUMB  2015   thumb  . CATARACT EXTRACTION Right 2013   Dr Ellison Hughs  . CATARACT EXTRACTION Left 04/2016  . COLONOSCOPY    . COLONOSCOPY WITH ESOPHAGOGASTRODUODENOSCOPY (EGD) AND ESOPHAGEAL DILATION (ED)    . EYE SURGERY     scar tissue removed after cataract surgery  . FOOT  SURGERY Left 2006, 2007   mortans neuroma  . KNEE ARTHROSCOPY Right 1994   x 3  . KNEE SURGERY Right 01/21/2006  . LUMBAR LAMINECTOMY/DECOMPRESSION MICRODISCECTOMY Right 02/11/2015   Procedure: Laminectomy and Foraminotomy - Right - L3-L4;  Surgeon: Earnie Larsson, MD;  Location: [facility];  Service: Neurosurgery;  Laterality: Right;  Laminectomy and Foraminotomy - Right - L3-L4  . mallet finger Right 2011  . NASAL SEPTUM SURGERY  1970  . NISSEN FUNDOPLICATION  8185  . RETINAL LASER PROCEDURE Right 02/01/2011   right eye   . TONSILLECTOMY  1981  . TOTAL KNEE ARTHROPLASTY Right 09/06/2016   Procedure: RIGHT TOTAL KNEE ARTHROPLASTY;  Surgeon: Gaynelle Arabian, MD;  Location: [facility];  Service: Orthopedics;  Laterality: Right;  . TRIGGER FINGER RELEASE Bilateral 2005  . UPPER GASTROINTESTINAL ENDOSCOPY    . VARICOSE VEIN SURGERY Bilateral 2007, 2009   left 2007, right 2009  . VITRECTOMY Right 2012    Family History  Problem Relation Age of Onset  . Breast cancer Mother   . Arthritis Mother   . Hyperlipidemia Mother   . Hypertension Mother   . Allergic rhinitis Mother   . Urticaria Mother   . Lung cancer Father   . Pancreatic cancer Father   . Skin cancer Father   . Arthritis Father   . Allergic rhinitis Father   . Emphysema Father   . Lymphoma Brother   . Celiac disease Brother   . Hypertension Brother   . Thyroid disease Brother   . Allergic rhinitis Brother   . Hyperlipidemia Maternal Grandfather   . Heart disease Maternal Grandfather   . Stroke Maternal Grandfather   . Eczema Maternal Aunt   . Asthma Neg Hx   . Immunodeficiency Neg Hx   . Angioedema Neg Hx   . Colon cancer Neg Hx   . Colon polyps Neg Hx   . Esophageal cancer Neg Hx   . Rectal cancer Neg Hx   . Stomach cancer Neg Hx     Allergies  Allergen Reactions  . Aleve [Naproxen Sodium]     Swelling, itching  . Cefuroxime Axetil     Hives / "throat swelling"  . Chlorzoxazone Rash and Other (See Comments)     "breathing problems"   . Oxycodone     Hallucinations, rash  . Penicillins Anaphylaxis and Hives    Has patient had a PCN reaction causing immediate  rash, facial/tongue/throat swelling, SOB or lightheadedness with hypotension: Yes Has patient had a PCN reaction causing severe rash involving mucus membranes or skin necrosis: No Has patient had a PCN reaction that required hospitalization No Has patient had a PCN reaction occurring within the last 10 years: No If all of the above answers are "NO", then may proceed with Cephalosporin use.   . Adhesive [Tape]     rash  . Cataflam [Diclofenac Potassium]     Lip swelling  . Clarithromycin Diarrhea  . Flagyl [Metronidazole Hcl]     ?diarrhea  . Fluzone [Flu Virus Vaccine]     Had local redness with high dose.   . Glucosamine Hives  . Guaifenesin & Derivatives     Stomach upset  . Hydrocodone-Acetaminophen     REACTION: rash, tightening of throat  . Ketorolac Tromethamine     ?reaction  . Pentazocine     Involuntary twitching  . Pneumovax [Pneumococcal Polysaccharide Vaccine] Other (See Comments)    Redness/swelling at site, nausea  . Prevnar [Pneumococcal 13-Val Conj Vacc] Other (See Comments)    Swelling, redness, nausea  . Smz-Tmp Ds [Sulfamethoxazole W/Trimethoprim (Co-Trimoxazole)]     ?unknown reaction  . Tramadol     constipation  . Trazodone And Nefazodone Other (See Comments)    Scratchy throat / congestion  . Latex Rash    Current Outpatient Medications on File Prior to Visit  Medication Sig Dispense Refill  . Azelaic Acid 15 % cream Apply twice daily to clean dry face 150 g 1  . CALCIUM CITRATE PO Take 1 tablet by mouth daily.    . cholecalciferol (VITAMIN D) 1000 units tablet Take 1 tablet (1,000 Units total) by mouth 2 (two) times daily.    . clobetasol cream (TEMOVATE) 1.61 % Apply 1 application topically 2 (two) times daily. 30 g 3  . Coenzyme Q10 200 MG capsule Take 200 mg by mouth daily.    . cyclobenzaprine  (FLEXERIL) 10 MG tablet Take 0.5 tablets (5 mg total) by mouth daily as needed for muscle spasms. 30 tablet 1  . diclofenac Sodium (VOLTAREN) 1 % GEL APPLY 2 GRAMS TOPICALLY TWICE A DAY AS NEEDED 300 g 3  . DULoxetine (CYMBALTA) 60 MG capsule Take 1 capsule (60 mg total) by mouth daily. 90 capsule 1  . Elderberry 575 MG/5ML SYRP Take 500 mg by mouth 2 (two) times a day.    Marland Kitchen EPINEPHrine (EPIPEN 2-PAK) 0.3 mg/0.3 mL IJ SOAJ injection Inject 0.3 mLs (0.3 mg total) into the muscle once. 1 Device 1  . famotidine (PEPCID) 20 MG tablet Take 1 tablet (20 mg total) by mouth 2 (two) times daily. 90 tablet 3  . FIBER PO Take 2 capsules by mouth 2 (two) times daily.    . fluticasone (FLONASE) 50 MCG/ACT nasal spray USE 1 SPRAY IN EACH NOSTRIL DAILY 48 g 0  . Misc Natural Products (TART CHERRY ADVANCED) CAPS 1200mg  by mouth twice daily    . Multiple Vitamin (MULTIVITAMIN) tablet Take 1 tablet by mouth daily.    Marland Kitchen omeprazole (PRILOSEC) 40 MG capsule TAKE 1 CAPSULE DAILY 90 capsule 3  . Probiotic Product (PROBIOTIC DAILY PO) Take 1 capsule by mouth daily.    . vitamin C (ASCORBIC ACID) 500 MG tablet Take 500 mg by mouth daily.    Marland Kitchen Zoster Vaccine Adjuvanted [facility]) injection Inject 0.5mg  IM now and again in 2-6 months. 0.5 mL 1  . clotrimazole-betamethasone (LOTRISONE) cream Apply 1 application topically 2 (two) times  daily as needed. 30 g 0  . pravastatin (PRAVACHOL) 40 MG tablet Take 1 tablet (40 mg total) by mouth daily. (Patient not taking: Reported on 01/08/2020) 90 tablet 1   No current facility-administered medications on file prior to visit.    BP 122/68 (BP Location: Right Arm, Patient Position: Sitting, Cuff Size: Small)   Pulse 81   Temp 98.4 F (36.9 C) (Temporal)   Resp 16   Ht 5\' 4"  (1.626 m)   Wt 158 lb (71.7 kg)   LMP 05/14/1996   SpO2 100%   BMI 27.12 kg/m       Objective:   Physical Exam Constitutional:      Appearance: She is well-developed.  Cardiovascular:     Rate and  Rhythm: Normal rate and regular rhythm.     Heart sounds: Normal heart sounds. No murmur heard.   Pulmonary:     Effort: Pulmonary effort is normal. No respiratory distress.     Breath sounds: Normal breath sounds. No wheezing.  Musculoskeletal:     Right lower leg: 2+ Edema present.     Left lower leg: 2+ Edema present.  Psychiatric:        Behavior: Behavior normal.        Thought Content: Thought content normal.        Judgment: Judgment normal.           Assessment & Plan:  Hyperlipidemia- reports resolution of nausea off of pravastatin. I have advised the patient to remain off of pravastatin and instead try crestor. She will let me know if she has any recurrent issues with nausea. Will need follow up lipid panel next visit.   Dysphagia- mild symptoms currently- only with pills that are not capsules. I have advised her to reach out to her GI doctor if she begins to have issues swallowing food.  This visit occurred during the SARS-CoV-2 public health emergency.  Safety protocols were in place, including screening questions prior to the visit, additional usage of staff PPE, and extensive cleaning of exam room while observing appropriate contact time as indicated for disinfecting solutions.

## 2020-01-10 DIAGNOSIS — M25562 Pain in left knee: Secondary | ICD-10-CM | POA: Diagnosis not present

## 2020-01-10 DIAGNOSIS — Z471 Aftercare following joint replacement surgery: Secondary | ICD-10-CM | POA: Diagnosis not present

## 2020-01-10 DIAGNOSIS — M6281 Muscle weakness (generalized): Secondary | ICD-10-CM | POA: Diagnosis not present

## 2020-01-17 DIAGNOSIS — M6281 Muscle weakness (generalized): Secondary | ICD-10-CM | POA: Diagnosis not present

## 2020-01-17 DIAGNOSIS — Z471 Aftercare following joint replacement surgery: Secondary | ICD-10-CM | POA: Diagnosis not present

## 2020-01-17 DIAGNOSIS — M25562 Pain in left knee: Secondary | ICD-10-CM | POA: Diagnosis not present

## 2020-01-19 DIAGNOSIS — M6281 Muscle weakness (generalized): Secondary | ICD-10-CM | POA: Diagnosis not present

## 2020-01-19 DIAGNOSIS — Z471 Aftercare following joint replacement surgery: Secondary | ICD-10-CM | POA: Diagnosis not present

## 2020-01-19 DIAGNOSIS — M25562 Pain in left knee: Secondary | ICD-10-CM | POA: Diagnosis not present

## 2020-01-23 DIAGNOSIS — M25562 Pain in left knee: Secondary | ICD-10-CM | POA: Diagnosis not present

## 2020-01-23 DIAGNOSIS — M6281 Muscle weakness (generalized): Secondary | ICD-10-CM | POA: Diagnosis not present

## 2020-01-23 DIAGNOSIS — Z471 Aftercare following joint replacement surgery: Secondary | ICD-10-CM | POA: Diagnosis not present

## 2020-01-25 DIAGNOSIS — M6281 Muscle weakness (generalized): Secondary | ICD-10-CM | POA: Diagnosis not present

## 2020-01-25 DIAGNOSIS — M25562 Pain in left knee: Secondary | ICD-10-CM | POA: Diagnosis not present

## 2020-01-25 DIAGNOSIS — Z471 Aftercare following joint replacement surgery: Secondary | ICD-10-CM | POA: Diagnosis not present

## 2020-01-27 ENCOUNTER — Encounter: Payer: Self-pay | Admitting: Family

## 2020-01-30 DIAGNOSIS — Z471 Aftercare following joint replacement surgery: Secondary | ICD-10-CM | POA: Diagnosis not present

## 2020-01-30 DIAGNOSIS — M6281 Muscle weakness (generalized): Secondary | ICD-10-CM | POA: Diagnosis not present

## 2020-01-30 DIAGNOSIS — M25562 Pain in left knee: Secondary | ICD-10-CM | POA: Diagnosis not present

## 2020-02-02 DIAGNOSIS — M6281 Muscle weakness (generalized): Secondary | ICD-10-CM | POA: Diagnosis not present

## 2020-02-02 DIAGNOSIS — Z471 Aftercare following joint replacement surgery: Secondary | ICD-10-CM | POA: Diagnosis not present

## 2020-02-02 DIAGNOSIS — M25562 Pain in left knee: Secondary | ICD-10-CM | POA: Diagnosis not present

## 2020-02-07 DIAGNOSIS — F4310 Post-traumatic stress disorder, unspecified: Secondary | ICD-10-CM | POA: Diagnosis not present

## 2020-02-08 DIAGNOSIS — Z471 Aftercare following joint replacement surgery: Secondary | ICD-10-CM | POA: Diagnosis not present

## 2020-02-08 DIAGNOSIS — M6281 Muscle weakness (generalized): Secondary | ICD-10-CM | POA: Diagnosis not present

## 2020-02-08 DIAGNOSIS — M25562 Pain in left knee: Secondary | ICD-10-CM | POA: Diagnosis not present

## 2020-02-21 DIAGNOSIS — M6281 Muscle weakness (generalized): Secondary | ICD-10-CM | POA: Diagnosis not present

## 2020-02-21 DIAGNOSIS — M25562 Pain in left knee: Secondary | ICD-10-CM | POA: Diagnosis not present

## 2020-02-21 DIAGNOSIS — F4310 Post-traumatic stress disorder, unspecified: Secondary | ICD-10-CM | POA: Diagnosis not present

## 2020-02-21 DIAGNOSIS — Z471 Aftercare following joint replacement surgery: Secondary | ICD-10-CM | POA: Diagnosis not present

## 2020-02-25 ENCOUNTER — Other Ambulatory Visit: Payer: Self-pay | Admitting: Internal Medicine

## 2020-03-03 ENCOUNTER — Other Ambulatory Visit: Payer: Self-pay | Admitting: Family

## 2020-03-03 ENCOUNTER — Encounter: Payer: Self-pay | Admitting: Family

## 2020-03-03 MED ORDER — CYCLOBENZAPRINE HCL 10 MG PO TABS: 10.0000 mg | ORAL_TABLET | Freq: Every day | ORAL | 3 refills | Status: AC | PRN

## 2020-03-12 ENCOUNTER — Other Ambulatory Visit: Payer: Self-pay | Admitting: Family

## 2020-03-18 ENCOUNTER — Other Ambulatory Visit: Payer: Self-pay

## 2020-03-18 ENCOUNTER — Ambulatory Visit (INDEPENDENT_AMBULATORY_CARE_PROVIDER_SITE_OTHER): Payer: Medicare Other | Admitting: Family

## 2020-03-18 VITALS — BP 130/73 | HR 98 | Temp 98.4°F | Resp 16 | Wt 153.4 lb

## 2020-03-18 DIAGNOSIS — Z23 Encounter for immunization: Secondary | ICD-10-CM | POA: Diagnosis not present

## 2020-03-18 NOTE — Progress Notes (Signed)
Patient here for flu shot. She is requesting a regular dose flu shot. Ok per provider.

## 2020-03-18 NOTE — Progress Notes (Signed)
Pt came in for flu shot. Requesting regular dose flu shot today.

## 2020-03-27 DIAGNOSIS — F4310 Post-traumatic stress disorder, unspecified: Secondary | ICD-10-CM | POA: Diagnosis not present

## 2020-04-10 DIAGNOSIS — F4310 Post-traumatic stress disorder, unspecified: Secondary | ICD-10-CM | POA: Diagnosis not present

## 2020-04-11 ENCOUNTER — Encounter: Payer: Self-pay | Admitting: Family

## 2020-04-11 ENCOUNTER — Ambulatory Visit: Payer: Medicare Other | Admitting: Family

## 2020-04-11 ENCOUNTER — Other Ambulatory Visit: Payer: Self-pay

## 2020-04-11 ENCOUNTER — Ambulatory Visit (INDEPENDENT_AMBULATORY_CARE_PROVIDER_SITE_OTHER): Payer: Medicare Other | Admitting: Family

## 2020-04-11 VITALS — BP 141/74 | HR 67 | Temp 98.3°F | Resp 16 | Ht 64.0 in | Wt 151.0 lb

## 2020-04-11 DIAGNOSIS — K219 Gastro-esophageal reflux disease without esophagitis: Secondary | ICD-10-CM

## 2020-04-11 DIAGNOSIS — F341 Dysthymic disorder: Secondary | ICD-10-CM

## 2020-04-11 DIAGNOSIS — G4733 Obstructive sleep apnea (adult) (pediatric): Secondary | ICD-10-CM | POA: Diagnosis not present

## 2020-04-11 DIAGNOSIS — E785 Hyperlipidemia, unspecified: Secondary | ICD-10-CM | POA: Diagnosis not present

## 2020-04-11 DIAGNOSIS — E1169 Type 2 diabetes mellitus with other specified complication: Secondary | ICD-10-CM

## 2020-04-11 DIAGNOSIS — J309 Allergic rhinitis, unspecified: Secondary | ICD-10-CM | POA: Diagnosis not present

## 2020-04-11 DIAGNOSIS — L309 Dermatitis, unspecified: Secondary | ICD-10-CM | POA: Insufficient documentation

## 2020-04-11 MED ORDER — CALCIUM CITRATE 250 MG PO TABS: 1.0000 | ORAL_TABLET | Freq: Two times a day (BID) | ORAL | 0 refills | Status: AC

## 2020-04-11 NOTE — Patient Instructions (Addendum)
Please complete lab work prior to leaving.   

## 2020-04-11 NOTE — Progress Notes (Signed)
Subjective:    Patient ID: Vanessa Oliver, female    DOB: Feb 07, 1951, 69 y.o.   MRN: [id number]  HPI  Patient is a 69 yr old female who presents today for follow up.  Hyperlipidemia- maintained on crestor Lab Results  Component Value Date   CHOL 173 05/23/2018   HDL 43.70 05/23/2018   LDLCALC 110 (H) 05/23/2018   TRIG 100.0 05/23/2018   CHOLHDL 4 05/23/2018   OSA-reports good compliance with cpap  Depression/anxiety- cymbalta 60mg  once daily. Overall her mood has been good. Feels a lot happier now.  Her husband recently passed.    GERD- omeprazole 40mg  once daily.  Reports that her symptoms are well controlled.  She uses pepcid 20mg  in AM per her allergist due to hx of hives. Reports stable.   Lab Results  Component Value Date   HGBA1C 5.4 07/04/2015     Allergic rhinitis- stable on flonase 2 sprays each nostril daily.  Used flexeril prn- rarely.   Uses clobetasol cream for occasional eczema symptoms.   Continues vit D and vit C  Review of Systems  Past Medical History:  Diagnosis Date  . Allergy    takes Singulair at night  . Anxiety   . Arthritis   . Cataract   . Chronic back pain    stenosis  . Depression    takes CYmbalta daily  . Esophageal stricture   . Family history of adverse reaction to anesthesia    oldest brother had trouble with anesthesia a long time ago but can't recall what  . Fibromyalgia   . Fibromyalgia   . GERD (gastroesophageal reflux disease)    takes Omeprazole daily  . Hemorrhoids   . History of bronchitis   . Hyperlipidemia   . IBS (irritable bowel syndrome)   . Joint pain   . Joint swelling   . Plantar fasciitis   . Rosacea   . Rosacea   . Sleep apnea    cpap- settings at 10 breathing   . TMJ (dislocation of temporomandibular joint)   . TMJ (temporomandibular joint disorder)   . Vasovagal episode back in the 80's  . Vasovagal syncope   . Weakness    right leg     Social History   Socioeconomic History  .  Marital status: Married    Spouse name: Not on file  . Number of children: 0  . Years of education: Not on file  . Highest education level: Not on file  Occupational History  . Occupation: Hydrologist: [employer]  Tobacco Use  . Smoking status: Never Smoker  . Smokeless tobacco: Never Used  Vaping Use  . Vaping Use: Never used  Substance and Sexual Activity  . Alcohol use: Yes    Alcohol/week: 0.0 standard drinks    Comment: rarely  . Drug use: No  . Sexual activity: Never    Birth control/protection: Surgical  Other Topics Concern  . Not on file  Social History Narrative   Works as an Glass blower/designer   Married- husband is 39 years older than her   Former Dance movement psychotherapist up up [employer]   Has cats   Enjoys reading   No children   Social Determinants of Radio broadcast assistant Strain:   . Difficulty of Paying Living Expenses: Not on file  Food Insecurity:   . Worried About Charity fundraiser in the Last Year: Not on file  . Ran Out of  Food in the Last Year: Not on file  Transportation Needs:   . Lack of Transportation (Medical): Not on file  . Lack of Transportation (Non-Medical): Not on file  Physical Activity:   . Days of Exercise per Week: Not on file  . Minutes of Exercise per Session: Not on file  Stress:   . Feeling of Stress : Not on file  Social Connections:   . Frequency of Communication with Friends and Family: Not on file  . Frequency of Social Gatherings with Friends and Family: Not on file  . Attends Religious Services: Not on file  . Active Member of Clubs or Organizations: Not on file  . Attends Archivist Meetings: Not on file  . Marital Status: Not on file  Intimate Partner Violence:   . Fear of Current or Ex-Partner: Not on file  . Emotionally Abused: Not on file  . Physically Abused: Not on file  . Sexually Abused: Not on file    Past Surgical History:  Procedure Laterality Date  . ABDOMINAL HYSTERECTOMY  1997  .  CARPAL TUNNEL RELEASE Bilateral 2004  . CARPOMETACARPAL (CMC) FUSION OF THUMB  2015   thumb  . CATARACT EXTRACTION Right 2013   Dr Ellison Hughs  . CATARACT EXTRACTION Left 04/2016  . COLONOSCOPY    . COLONOSCOPY WITH ESOPHAGOGASTRODUODENOSCOPY (EGD) AND ESOPHAGEAL DILATION (ED)    . EYE SURGERY     scar tissue removed after cataract surgery  . 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 KNEE ARTHROSCOPY Right 1994   x 3  . KNEE SURGERY Right 01/21/2006  . LUMBAR LAMINECTOMY/DECOMPRESSION MICRODISCECTOMY Right 02/11/2015   Procedure: Laminectomy and Foraminotomy - Right - L3-L4;  Surgeon: Earnie Larsson, MD;  Location: [facility];  Service: Neurosurgery;  Laterality: Right;  Laminectomy and Foraminotomy - Right - L3-L4  . mallet finger Right 2011  . NASAL SEPTUM SURGERY  1970  . NISSEN FUNDOPLICATION  4650  . RETINAL LASER PROCEDURE Right 02/01/2011   right eye   . TONSILLECTOMY  1981  . TOTAL KNEE ARTHROPLASTY Right 09/06/2016   Procedure: RIGHT TOTAL KNEE ARTHROPLASTY;  Surgeon: Gaynelle Arabian, MD;  Location: [facility];  Service: Orthopedics;  Laterality: Right;  . TRIGGER FINGER RELEASE Bilateral 2005  . UPPER GASTROINTESTINAL ENDOSCOPY    . VARICOSE VEIN SURGERY Bilateral 2007, 2009   left 2007, right 2009  . VITRECTOMY Right 2012    Family History  Problem Relation Age of Onset  . Breast cancer Mother   . Arthritis Mother   . Hyperlipidemia Mother   . Hypertension Mother   . Allergic rhinitis Mother   . Urticaria Mother   . Lung cancer Father   . Pancreatic cancer Father   . Skin cancer Father   . Arthritis Father   . Allergic rhinitis Father   . Emphysema Father   . Lymphoma Brother   . Celiac disease Brother   . Hypertension Brother   . Thyroid disease Brother   . Allergic rhinitis Brother   . Hyperlipidemia Maternal Grandfather   . Heart disease Maternal Grandfather   . Stroke Maternal Grandfather   . Eczema Maternal Aunt   . Asthma Neg Hx   . Immunodeficiency Neg  Hx   . Angioedema Neg Hx   . Colon cancer Neg Hx   . Colon polyps Neg Hx   . Esophageal cancer Neg Hx   . Rectal cancer Neg Hx   . Stomach cancer Neg Hx  Allergies  Allergen Reactions  . Aleve [Naproxen Sodium]     Swelling, itching  . Cefuroxime Axetil     Hives / "throat swelling"  . Chlorzoxazone Rash and Other (See Comments)    "breathing problems"   . Oxycodone     Hallucinations, rash  . Penicillins Anaphylaxis and Hives    Has patient had a PCN reaction causing immediate rash, facial/tongue/throat swelling, SOB or lightheadedness with hypotension: Yes Has patient had a PCN reaction causing severe rash involving mucus membranes or skin necrosis: No Has patient had a PCN reaction that required hospitalization No Has patient had a PCN reaction occurring within the last 10 years: No If all of the above answers are "NO", then may proceed with Cephalosporin use.   . Adhesive [Tape]     rash  . Cataflam [Diclofenac Potassium]     Lip swelling  . Clarithromycin Diarrhea  . Flagyl [Metronidazole Hcl]     ?diarrhea  . Fluzone [Flu Virus Vaccine]     Had local redness with high dose.   . Glucosamine Hives  . Guaifenesin & Derivatives     Stomach upset  . Hydrocodone-Acetaminophen     REACTION: rash, tightening of throat  . Ketorolac Tromethamine     ?reaction  . Pentazocine     Involuntary twitching  . Pneumovax [Pneumococcal Polysaccharide Vaccine] Other (See Comments)    Redness/swelling at site, nausea  . Prevnar [Pneumococcal 13-Val Conj Vacc] Other (See Comments)    Swelling, redness, nausea  . Smz-Tmp Ds [Sulfamethoxazole W/Trimethoprim (Co-Trimoxazole)]     ?unknown reaction  . Tramadol     constipation  . Trazodone And Nefazodone Other (See Comments)    Scratchy throat / congestion  . Latex Rash    Current Outpatient Medications on File Prior to Visit  Medication Sig Dispense Refill  . Azelaic Acid 15 % cream Apply twice daily to clean dry face 150 g  1  . Black Elderberry,Berry-Flower, 575 MG CAPS Take by mouth.    . cholecalciferol (VITAMIN D) 1000 units tablet Take 1 tablet (1,000 Units total) by mouth 2 (two) times daily.    . clobetasol cream (TEMOVATE) 9.56 % Apply 1 application topically 2 (two) times daily. 30 g 3  . Coenzyme Q10 200 MG capsule Take 200 mg by mouth daily.    . cyclobenzaprine (FLEXERIL) 10 MG tablet Take 1 tablet (10 mg total) by mouth daily as needed for muscle spasms. 30 tablet 3  . diclofenac Sodium (VOLTAREN) 1 % GEL APPLY 2 GRAMS TOPICALLY TWICE A DAY AS NEEDED 300 g 3  . DULoxetine (CYMBALTA) 60 MG capsule TAKE 1 CAPSULE DAILY (NEED FOLLOW UP) 90 capsule 3  . EPINEPHrine (EPIPEN 2-PAK) 0.3 mg/0.3 mL IJ SOAJ injection Inject 0.3 mLs (0.3 mg total) into the muscle once. 1 Device 1  . famotidine (PEPCID) 20 mg Inject 20 mg into the vein 1 day or 1 dose.    Marland Kitchen FIBER PO Take 2 capsules by mouth 2 (two) times daily.    . fluticasone (FLONASE) 50 MCG/ACT nasal spray USE 1 SPRAY IN EACH NOSTRIL DAILY 48 g 0  . Misc Natural Products (TART CHERRY ADVANCED) CAPS 1200mg  by mouth twice daily    . Multiple Vitamin (MULTIVITAMIN) tablet Take 1 tablet by mouth daily.    Marland Kitchen omeprazole (PRILOSEC) 40 MG capsule TAKE 1 CAPSULE DAILY 90 capsule 3  . Probiotic Product (PROBIOTIC DAILY PO) Take 1 capsule by mouth daily.    . rosuvastatin (CRESTOR) 5  MG tablet Take 1 tablet (5 mg total) by mouth daily. 90 tablet 1  . vitamin C (ASCORBIC ACID) 500 MG tablet Take 500 mg by mouth daily.    Marland Kitchen Zoster Vaccine Adjuvanted [facility]) injection Inject 0.5mg  IM now and again in 2-6 months. 0.5 mL 1  . [DISCONTINUED] CALCIUM CITRATE PO Take 1 tablet by mouth daily.     No current facility-administered medications on file prior to visit.    BP (!) 141/74 (BP Location: Right Arm, Patient Position: Sitting, Cuff Size: Small)   Pulse 67   Temp 98.3 F (36.8 C) (Oral)   Resp 16   Ht 5\' 4"  (1.626 m)   Wt 151 lb (68.5 kg)   LMP 05/14/1996   SpO2  99%   BMI 25.92 kg/m        Objective:   Physical Exam Constitutional:      Appearance: She is well-developed.  Neck:     Thyroid: No thyromegaly.  Cardiovascular:     Rate and Rhythm: Normal rate and regular rhythm.     Heart sounds: Normal heart sounds. No murmur heard.   Pulmonary:     Effort: Pulmonary effort is normal. No respiratory distress.     Breath sounds: Normal breath sounds. No wheezing.  Musculoskeletal:     Cervical back: Neck supple.     Right lower leg: 2+ Edema present.     Left lower leg: 2+ Edema present.  Skin:    General: Skin is warm and dry.  Neurological:     Mental Status: She is alert and oriented to person, place, and time.  Psychiatric:        Behavior: Behavior normal.        Thought Content: Thought content normal.        Judgment: Judgment normal.           Assessment & Plan:  Hyperlipidemia- continue crestor 5mg  daily, obtain follow up cmet, lipid panel  OSA- good compliance with cpap. Follows with pulmonology.  GERD- stable. Continue omeprazole 40mg  once daily and pepcid 20mg  once daily.  Depression/anxiety- stable on cymbalta 60mg  once daily. Continue current dose. She is much less stressed since her husband passed as his health was very poor and she was worrying about his care.  Allergic rhinitis- stable on flonase, continue same.   Eczema- stable with prn  Use of clobetasol 0.1%  This visit occurred during the SARS-CoV-2 public health emergency.  Safety protocols were in place, including screening questions prior to the visit, additional usage of staff PPE, and extensive cleaning of exam room while observing appropriate contact time as indicated for disinfecting solutions.

## 2020-04-12 LAB — COMPREHENSIVE METABOLIC PANEL
AG Ratio: 2 (calc) (ref 1.0–2.5)
ALT: 13 U/L (ref 6–29)
AST: 15 U/L (ref 10–35)
Albumin: 4.8 g/dL (ref 3.6–5.1)
Alkaline phosphatase (APISO): 76 U/L (ref 37–153)
BUN: 13 mg/dL (ref 7–25)
CO2: 29 mmol/L (ref 20–32)
Calcium: 10.4 mg/dL (ref 8.6–10.4)
Chloride: 103 mmol/L (ref 98–110)
Creat: 0.62 mg/dL (ref 0.60–0.93)
Globulin: 2.4 g/dL (calc) (ref 1.9–3.7)
Glucose, Bld: 96 mg/dL (ref 65–99)
Potassium: 5.2 mmol/L (ref 3.5–5.3)
Sodium: 140 mmol/L (ref 135–146)
Total Bilirubin: 0.8 mg/dL (ref 0.2–1.2)
Total Protein: 7.2 g/dL (ref 6.1–8.1)

## 2020-04-12 LAB — LIPID PANEL
Cholesterol: 171 mg/dL (ref <200)
HDL: 49 mg/dL — ABNORMAL LOW (ref >=50)
LDL Cholesterol (Calc): 98 mg/dL (calc)
Non-HDL Cholesterol (Calc): 122 mg/dL (calc) (ref <130)
Total CHOL/HDL Ratio: 3.5 (calc) (ref <5.0)
Triglycerides: 142 mg/dL (ref <150)

## 2020-04-17 ENCOUNTER — Other Ambulatory Visit (HOSPITAL_BASED_OUTPATIENT_CLINIC_OR_DEPARTMENT_OTHER): Payer: Self-pay | Admitting: Family

## 2020-04-17 DIAGNOSIS — Z1231 Encounter for screening mammogram for malignant neoplasm of breast: Secondary | ICD-10-CM

## 2020-04-21 DIAGNOSIS — F4310 Post-traumatic stress disorder, unspecified: Secondary | ICD-10-CM | POA: Diagnosis not present

## 2020-04-23 ENCOUNTER — Other Ambulatory Visit: Payer: Self-pay | Admitting: Family

## 2020-04-29 ENCOUNTER — Ambulatory Visit: Payer: Self-pay | Admitting: *Deleted

## 2020-05-08 DIAGNOSIS — F4310 Post-traumatic stress disorder, unspecified: Secondary | ICD-10-CM | POA: Diagnosis not present

## 2020-05-14 ENCOUNTER — Other Ambulatory Visit: Payer: Self-pay | Admitting: Family

## 2020-05-20 ENCOUNTER — Other Ambulatory Visit: Payer: Self-pay

## 2020-05-20 ENCOUNTER — Ambulatory Visit (INDEPENDENT_AMBULATORY_CARE_PROVIDER_SITE_OTHER): Payer: Medicare Other | Admitting: Adult Health

## 2020-05-20 ENCOUNTER — Encounter: Payer: Self-pay | Admitting: Adult Health

## 2020-05-20 DIAGNOSIS — E785 Hyperlipidemia, unspecified: Secondary | ICD-10-CM

## 2020-05-20 DIAGNOSIS — G4733 Obstructive sleep apnea (adult) (pediatric): Secondary | ICD-10-CM | POA: Diagnosis not present

## 2020-05-20 MED ORDER — ROSUVASTATIN CALCIUM 5 MG PO TABS: 5.0000 mg | ORAL_TABLET | Freq: Every day | ORAL | 1 refills | Status: DC

## 2020-05-20 NOTE — Assessment & Plan Note (Signed)
Excellent control and compliance on nocturnal CPAP  Plan  Patient Instructions  Keep up good work on CPAP  Continue on CPAP At bedtime   Work on healthy weight .  Remain active .  Do not drive if sleepy . Follow up with Dr. Elsworth Soho  In 1 year and As needed

## 2020-05-20 NOTE — Progress Notes (Signed)
@Patient  ID: Vanessa Oliver, female    DOB: 01-08-1951, 69 y.o.   MRN: [id number]  Chief Complaint  Patient presents with  . Follow-up    OSA     Referring provider: Debbrah Alar, NP  HPI: 69 year old female followed for obstructive sleep apnea  TEST/EVENTS :  PSG 07/2014-weight 163 pounds- RDI 23/hour, with 24 central apneas, total sleep time 277 minutes   05/20/2020 Follow up : OSA Patient presents for a 1 year follow-up.  Patient has underlying moderate obstructive sleep apnea.  She is on nocturnal CPAP.  Patient says she wears her CPAP every night cannot sleep without it.  Patient feels rested with no significant daytime sleepiness and feels that she benefits from CPAP.  CPAP download shows excellent compliance with 100% usage.  She is uses her machine 9 hours each night.  Patient is on CPAP 10 cm H2O.  AHI is 4.1.  Patient education on sleep apnea and CPAP care. Has had a lot of stress , husband passed away earlier this year.    Allergies  Allergen Reactions  . Aleve [Naproxen Sodium]     Swelling, itching  . Cefuroxime Axetil     Hives / "throat swelling"  . Chlorzoxazone Rash and Other (See Comments)    "breathing problems"   . Oxycodone     Hallucinations, rash  . Penicillins Anaphylaxis and Hives    Has patient had a PCN reaction causing immediate rash, facial/tongue/throat swelling, SOB or lightheadedness with hypotension: Yes Has patient had a PCN reaction causing severe rash involving mucus membranes or skin necrosis: No Has patient had a PCN reaction that required hospitalization No Has patient had a PCN reaction occurring within the last 10 years: No If all of the above answers are "NO", then may proceed with Cephalosporin use.   . Adhesive [Tape]     rash  . Cataflam [Diclofenac Potassium]     Lip swelling  . Clarithromycin Diarrhea  . Flagyl [Metronidazole Hcl]     ?diarrhea  . Fluzone [Flu Virus Vaccine]     Had local redness with high  dose.   . Glucosamine Hives  . Guaifenesin & Derivatives     Stomach upset  . Hydrocodone-Acetaminophen     REACTION: rash, tightening of throat  . Ketorolac Tromethamine     ?reaction  . Pentazocine     Involuntary twitching  . Pneumovax [Pneumococcal Polysaccharide Vaccine] Other (See Comments)    Redness/swelling at site, nausea  . Prevnar [Pneumococcal 13-Val Conj Vacc] Other (See Comments)    Swelling, redness, nausea  . Smz-Tmp Ds [Sulfamethoxazole W/Trimethoprim (Co-Trimoxazole)]     ?unknown reaction  . Tramadol     constipation  . Trazodone And Nefazodone Other (See Comments)    Scratchy throat / congestion  . Latex Rash    Immunization History  Administered Date(s) Administered  . Influenza, High Dose Seasonal PF 03/19/2016, 03/18/2017, 03/14/2018  . Influenza,inj,Quad PF,6+ Mos 03/14/2015, 03/16/2019, 03/18/2020  . Influenza-Unspecified 03/23/2014  . PFIZER SARS-COV-2 Vaccination 08/18/2019, 09/12/2019, 04/13/2020  . Pneumococcal Conjugate-13 09/23/2014  . Td 02/16/2011  . Zoster 08/08/2009    Past Medical History:  Diagnosis Date  . Allergy    takes Singulair at night  . Anxiety   . Arthritis   . Cataract   . Chronic back pain    stenosis  . Depression    takes CYmbalta daily  . Esophageal stricture   . Family history of adverse reaction to anesthesia    oldest  brother had trouble with anesthesia a long time ago but can't recall what  . Fibromyalgia   . Fibromyalgia   . GERD (gastroesophageal reflux disease)    takes Omeprazole daily  . Hemorrhoids   . History of bronchitis   . Hyperlipidemia   . IBS (irritable bowel syndrome)   . Joint pain   . Joint swelling   . Plantar fasciitis   . Rosacea   . Rosacea   . Sleep apnea    cpap- settings at 10 breathing   . TMJ (dislocation of temporomandibular joint)   . TMJ (temporomandibular joint disorder)   . Vasovagal episode back in the 80's  . Vasovagal syncope   . Weakness    right leg     Tobacco History: Social History   Tobacco Use  Smoking Status Never Smoker  Smokeless Tobacco Never Used   Counseling given: Not Answered   Outpatient Medications Prior to Visit  Medication Sig Dispense Refill  . Azelaic Acid 15 % cream Apply twice daily to clean dry face 150 g 1  . Black Elderberry,Berry-Flower, 575 MG CAPS Take by mouth.    . Calcium Citrate 250 MG TABS Take 1 tablet (250 mg total) by mouth in the morning and at bedtime.  0  . cholecalciferol (VITAMIN D) 1000 units tablet Take 1 tablet (1,000 Units total) by mouth 2 (two) times daily.    . clobetasol cream (TEMOVATE) 7.42 % Apply 1 application topically 2 (two) times daily. 30 g 3  . Coenzyme Q10 200 MG capsule Take 200 mg by mouth daily.    . cyclobenzaprine (FLEXERIL) 10 MG tablet Take 1 tablet (10 mg total) by mouth daily as needed for muscle spasms. 30 tablet 3  . diclofenac Sodium (VOLTAREN) 1 % GEL APPLY 2 GRAMS TOPICALLY TWICE A DAY AS NEEDED 300 g 3  . DULoxetine (CYMBALTA) 60 MG capsule TAKE 1 CAPSULE DAILY (NEED FOLLOW UP) 90 capsule 3  . EPINEPHrine (EPIPEN 2-PAK) 0.3 mg/0.3 mL IJ SOAJ injection Inject 0.3 mLs (0.3 mg total) into the muscle once. 1 Device 1  . famotidine (PEPCID) 20 mg Inject 20 mg into the vein 1 day or 1 dose.    Marland Kitchen FIBER PO Take 2 capsules by mouth 2 (two) times daily.    . fluticasone (FLONASE) 50 MCG/ACT nasal spray USE 1 SPRAY IN EACH NOSTRIL DAILY 48 g 0  . Misc Natural Products (TART CHERRY ADVANCED) CAPS 1200mg  by mouth twice daily    . Multiple Vitamin (MULTIVITAMIN) tablet Take 1 tablet by mouth daily.    Marland Kitchen omeprazole (PRILOSEC) 40 MG capsule TAKE 1 CAPSULE DAILY 90 capsule 3  . pravastatin (PRAVACHOL) 40 MG tablet TAKE 1 TABLET DAILY 90 tablet 4  . Probiotic Product (PROBIOTIC DAILY PO) Take 1 capsule by mouth daily.    . rosuvastatin (CRESTOR) 5 MG tablet Take 1 tablet (5 mg total) by mouth daily. 90 tablet 1  . vitamin C (ASCORBIC ACID) 500 MG tablet Take 500 mg by mouth  daily.    Marland Kitchen Zoster Vaccine Adjuvanted [facility]) injection Inject 0.5mg  IM now and again in 2-6 months. 0.5 mL 1   No facility-administered medications prior to visit.     Review of Systems:   Constitutional:   No  weight loss, night sweats,  Fevers, chills, fatigue, or  lassitude.  HEENT:   No headaches,  Difficulty swallowing,  Tooth/dental problems, or  Sore throat,  No sneezing, itching, ear ache, nasal congestion, post nasal drip,   CV:  No chest pain,  Orthopnea, PND, swelling in lower extremities, anasarca, dizziness, palpitations, syncope.   GI  No heartburn, indigestion, abdominal pain, nausea, vomiting, diarrhea, change in bowel habits, loss of appetite, bloody stools.   Resp: No shortness of breath with exertion or at rest.  No excess mucus, no productive cough,  No non-productive cough,  No coughing up of blood.  No change in color of mucus.  No wheezing.  No chest wall deformity  Skin: no rash or lesions.  GU: no dysuria, change in color of urine, no urgency or frequency.  No flank pain, no hematuria   MS:  No joint pain or swelling.  No decreased range of motion.  No back pain.    Physical Exam  BP 130/80 (BP Location: Left Arm, Patient Position: Sitting, Cuff Size: Normal)   Pulse 85   Temp 97.6 F (36.4 C) (Temporal)   Ht 5\' 4"  (1.626 m)   Wt 156 lb 3.2 oz (70.9 kg)   LMP 05/14/1996   SpO2 98%   BMI 26.81 kg/m   GEN: A/Ox3; pleasant , NAD, well nourished    HEENT:  [place]/AT,   NOSE-clear, THROAT-clear, no lesions, no postnasal drip or exudate noted.   NECK:  Supple w/ fair ROM; no JVD; normal carotid impulses w/o bruits; no thyromegaly or nodules palpated; no lymphadenopathy.    RESP  Clear  P & A; w/o, wheezes/ rales/ or rhonchi. no accessory muscle use, no dullness to percussion  CARD:  RRR, no m/r/g, no peripheral edema, pulses intact, no cyanosis or clubbing.  GI:   Soft & nt; nml bowel sounds; no organomegaly or masses detected.    Musco: Warm bil, no deformities or joint swelling noted.   Neuro: alert, no focal deficits noted.    Skin: Warm, no lesions or rashes     BNP No results found for: BNP  ProBNP No results found for: PROBNP  Imaging: No results found.    No flowsheet data found.  No results found for: NITRICOXIDE      Assessment & Plan:   OSA (obstructive sleep apnea) Excellent control and compliance on nocturnal CPAP  Plan  Patient Instructions  Keep up good work on CPAP  Continue on CPAP At bedtime   Work on healthy weight .  Remain active .  Do not drive if sleepy . Follow up with Dr. Elsworth Soho  In 1 year and As needed           [name], NP 05/20/2020

## 2020-05-20 NOTE — Patient Instructions (Signed)
Keep up good work on CPAP  Continue on CPAP At bedtime   Work on healthy weight .  Remain active .  Do not drive if sleepy . Follow up with Dr. Elsworth Soho  In 1 year and As needed

## 2020-05-30 ENCOUNTER — Other Ambulatory Visit: Payer: Self-pay

## 2020-05-30 ENCOUNTER — Ambulatory Visit (INDEPENDENT_AMBULATORY_CARE_PROVIDER_SITE_OTHER): Payer: Medicare Other | Admitting: Family

## 2020-05-30 ENCOUNTER — Encounter: Payer: Self-pay | Admitting: Family

## 2020-05-30 VITALS — BP 131/70 | HR 92 | Temp 98.6°F | Resp 16 | Ht 64.0 in | Wt 153.0 lb

## 2020-05-30 DIAGNOSIS — R5383 Other fatigue: Secondary | ICD-10-CM

## 2020-05-30 DIAGNOSIS — F32A Depression, unspecified: Secondary | ICD-10-CM

## 2020-05-30 DIAGNOSIS — M5416 Radiculopathy, lumbar region: Secondary | ICD-10-CM

## 2020-05-30 MED ORDER — PREDNISONE 10 MG PO TABS: ORAL_TABLET | ORAL | 0 refills | Status: DC

## 2020-05-30 MED ORDER — BUPROPION HCL ER (XL) 150 MG PO TB24: 150.0000 mg | ORAL_TABLET | Freq: Every day | ORAL | 1 refills | Status: DC

## 2020-05-30 NOTE — Progress Notes (Signed)
Subjective:    Patient ID: Vanessa Oliver, female    DOB: [birth year], 69 y.o.   MRN: [id number]  HPI   Patient is a 69 yr old female who presents today with chief complaint of pain radiating from the left hip down the left leg.   She reports feeling tired often. She is just getting through all the paperwork related to her husband's death.  She feels like she is more depressed recently. She continues cymbalta 60mg  once daily. She is working with a Transport planner.     Review of Systems    see HPI  Past Medical History:  Diagnosis Date  . Allergy    takes Singulair at night  . Anxiety   . Arthritis   . Cataract   . Chronic back pain    stenosis  . Depression    takes CYmbalta daily  . Esophageal stricture   . Family history of adverse reaction to anesthesia    oldest brother had trouble with anesthesia a long time ago but can't recall what  . Fibromyalgia   . Fibromyalgia   . GERD (gastroesophageal reflux disease)    takes Omeprazole daily  . Hemorrhoids   . History of bronchitis   . Hyperlipidemia   . IBS (irritable bowel syndrome)   . Joint pain   . Joint swelling   . Plantar fasciitis   . Rosacea   . Rosacea   . Sleep apnea    cpap- settings at 10 breathing   . TMJ (dislocation of temporomandibular joint)   . TMJ (temporomandibular joint disorder)   . Vasovagal episode back in the 80's  . Vasovagal syncope   . Weakness    right leg     Social History   Socioeconomic History  . Marital status: Widowed    Spouse name: Not on file  . Number of children: 0  . Years of education: Not on file  . Highest education level: Not on file  Occupational History  . Occupation: Hydrologist: [employer]  Tobacco Use  . Smoking status: Never Smoker  . Smokeless tobacco: Never Used  Vaping Use  . Vaping Use: Never used  Substance and Sexual Activity  . Alcohol use: Yes    Alcohol/week: 0.0 standard drinks    Comment: rarely  . Drug use: No  . Sexual  activity: Never    Birth control/protection: Surgical  Other Topics Concern  . Not on file  Social History Narrative   Works as an Glass blower/designer   Married- husband is 51 years older than her   Former Dance movement psychotherapist up up [employer]   Has cats   Enjoys reading   No children   Social Determinants of Radio broadcast assistant Strain:   . Difficulty of Paying Living Expenses: Not on file  Food Insecurity:   . Worried About Charity fundraiser in the Last Year: Not on file  . Ran Out of Food in the Last Year: Not on file  Transportation Needs:   . Lack of Transportation (Medical): Not on file  . Lack of Transportation (Non-Medical): Not on file  Physical Activity:   . Days of Exercise per Week: Not on file  . Minutes of Exercise per Session: Not on file  Stress:   . Feeling of Stress : Not on file  Social Connections:   . Frequency of Communication with Friends and Family: Not on file  . Frequency of Social  Gatherings with Friends and Family: Not on file  . Attends Religious Services: Not on file  . Active Member of Clubs or Organizations: Not on file  . Attends Archivist Meetings: Not on file  . Marital Status: Not on file  Intimate Partner Violence:   . Fear of Current or Ex-Partner: Not on file  . Emotionally Abused: Not on file  . Physically Abused: Not on file  . Sexually Abused: Not on file    Past Surgical History:  Procedure Laterality Date  . ABDOMINAL HYSTERECTOMY  1997  . CARPAL TUNNEL RELEASE Bilateral 2004  . CARPOMETACARPAL (CMC) FUSION OF THUMB  2015   thumb  . CATARACT EXTRACTION Right 2013   Dr Ellison Hughs  . CATARACT EXTRACTION Left 04/2016  . COLONOSCOPY    . COLONOSCOPY WITH ESOPHAGOGASTRODUODENOSCOPY (EGD) AND ESOPHAGEAL DILATION (ED)    . EYE SURGERY     scar tissue removed after cataract surgery  . 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 KNEE ARTHROSCOPY Right 1994   x 3  . KNEE SURGERY Right 01/21/2006  . LUMBAR  LAMINECTOMY/DECOMPRESSION MICRODISCECTOMY Right 02/11/2015   Procedure: Laminectomy and Foraminotomy - Right - L3-L4;  Surgeon: Earnie Larsson, MD;  Location: [facility];  Service: Neurosurgery;  Laterality: Right;  Laminectomy and Foraminotomy - Right - L3-L4  . mallet finger Right 2011  . NASAL SEPTUM SURGERY  1970  . NISSEN FUNDOPLICATION  0254  . RETINAL LASER PROCEDURE Right 02/01/2011   right eye   . TONSILLECTOMY  1981  . TOTAL KNEE ARTHROPLASTY Right 09/06/2016   Procedure: RIGHT TOTAL KNEE ARTHROPLASTY;  Surgeon: Gaynelle Arabian, MD;  Location: [facility];  Service: Orthopedics;  Laterality: Right;  . TRIGGER FINGER RELEASE Bilateral 2005  . UPPER GASTROINTESTINAL ENDOSCOPY    . VARICOSE VEIN SURGERY Bilateral 2007, 2009   left 2007, right 2009  . VITRECTOMY Right 2012    Family History  Problem Relation Age of Onset  . Breast cancer Mother   . Arthritis Mother   . Hyperlipidemia Mother   . Hypertension Mother   . Allergic rhinitis Mother   . Urticaria Mother   . Lung cancer Father   . Pancreatic cancer Father   . Skin cancer Father   . Arthritis Father   . Allergic rhinitis Father   . Emphysema Father   . Lymphoma Brother   . Celiac disease Brother   . Hypertension Brother   . Thyroid disease Brother   . Allergic rhinitis Brother   . Hyperlipidemia Maternal Grandfather   . Heart disease Maternal Grandfather   . Stroke Maternal Grandfather   . Eczema Maternal Aunt   . Asthma Neg Hx   . Immunodeficiency Neg Hx   . Angioedema Neg Hx   . Colon cancer Neg Hx   . Colon polyps Neg Hx   . Esophageal cancer Neg Hx   . Rectal cancer Neg Hx   . Stomach cancer Neg Hx     Allergies  Allergen Reactions  . Aleve [Naproxen Sodium]     Swelling, itching  . Cefuroxime Axetil     Hives / "throat swelling"  . Chlorzoxazone Rash and Other (See Comments)    "breathing problems"   . Oxycodone     Hallucinations, rash  . Penicillins Anaphylaxis and Hives    Has patient had a PCN  reaction causing immediate rash, facial/tongue/throat swelling, SOB or lightheadedness with hypotension: Yes Has patient had a PCN reaction causing severe  rash involving mucus membranes or skin necrosis: No Has patient had a PCN reaction that required hospitalization No Has patient had a PCN reaction occurring within the last 10 years: No If all of the above answers are "NO", then may proceed with Cephalosporin use.   . Adhesive [Tape]     rash  . Cataflam [Diclofenac Potassium]     Lip swelling  . Clarithromycin Diarrhea  . Flagyl [Metronidazole Hcl]     ?diarrhea  . Fluzone [Flu Virus Vaccine]     Had local redness with high dose.   . Glucosamine Hives  . Guaifenesin & Derivatives     Stomach upset  . Hydrocodone-Acetaminophen     REACTION: rash, tightening of throat  . Ketorolac Tromethamine     ?reaction  . Pentazocine     Involuntary twitching  . Pneumovax [Pneumococcal Polysaccharide Vaccine] Other (See Comments)    Redness/swelling at site, nausea  . Prevnar [Pneumococcal 13-Val Conj Vacc] Other (See Comments)    Swelling, redness, nausea  . Smz-Tmp Ds [Sulfamethoxazole W/Trimethoprim (Co-Trimoxazole)]     ?unknown reaction  . Tramadol     constipation  . Trazodone And Nefazodone Other (See Comments)    Scratchy throat / congestion  . Latex Rash    Current Outpatient Medications on File Prior to Visit  Medication Sig Dispense Refill  . Azelaic Acid 15 % cream Apply twice daily to clean dry face 150 g 1  . Black Elderberry,Berry-Flower, 575 MG CAPS Take by mouth.    . Calcium Citrate 250 MG TABS Take 1 tablet (250 mg total) by mouth in the morning and at bedtime.  0  . cholecalciferol (VITAMIN D) 1000 units tablet Take 1 tablet (1,000 Units total) by mouth 2 (two) times daily.    . clobetasol cream (TEMOVATE) 0.62 % Apply 1 application topically 2 (two) times daily. 30 g 3  . Coenzyme Q10 200 MG capsule Take 200 mg by mouth daily.    . cyclobenzaprine (FLEXERIL) 10  MG tablet Take 1 tablet (10 mg total) by mouth daily as needed for muscle spasms. 30 tablet 3  . diclofenac Sodium (VOLTAREN) 1 % GEL APPLY 2 GRAMS TOPICALLY TWICE A DAY AS NEEDED 300 g 3  . DULoxetine (CYMBALTA) 60 MG capsule TAKE 1 CAPSULE DAILY (NEED FOLLOW UP) 90 capsule 3  . EPINEPHrine (EPIPEN 2-PAK) 0.3 mg/0.3 mL IJ SOAJ injection Inject 0.3 mLs (0.3 mg total) into the muscle once. 1 Device 1  . famotidine (PEPCID) 20 mg Inject 20 mg into the vein 1 day or 1 dose.    Marland Kitchen FIBER PO Take 2 capsules by mouth 2 (two) times daily.    . fluticasone (FLONASE) 50 MCG/ACT nasal spray USE 1 SPRAY IN EACH NOSTRIL DAILY 48 g 0  . Misc Natural Products (TART CHERRY ADVANCED) CAPS 1200mg  by mouth twice daily    . Multiple Vitamin (MULTIVITAMIN) tablet Take 1 tablet by mouth daily.    Marland Kitchen omeprazole (PRILOSEC) 40 MG capsule TAKE 1 CAPSULE DAILY 90 capsule 3  . Probiotic Product (PROBIOTIC DAILY PO) Take 1 capsule by mouth daily.    . rosuvastatin (CRESTOR) 5 MG tablet Take 1 tablet (5 mg total) by mouth daily. 90 tablet 1  . vitamin C (ASCORBIC ACID) 500 MG tablet Take 500 mg by mouth daily.    Marland Kitchen Zoster Vaccine Adjuvanted [facility]) injection Inject 0.5mg  IM now and again in 2-6 months. 0.5 mL 1   No current facility-administered medications on file prior to visit.  BP 131/70 (BP Location: Right Arm, Patient Position: Sitting, Cuff Size: Small)   Pulse 92   Temp 98.6 F (37 C) (Temporal)   Resp 16   Ht 5\' 4"  (1.626 m)   Wt 153 lb (69.4 kg)   LMP 05/14/1996   SpO2 100%   BMI 26.26 kg/m    Objective:   Physical Exam Constitutional:      Appearance: She is well-developed.  Cardiovascular:     Rate and Rhythm: Normal rate and regular rhythm.     Heart sounds: Normal heart sounds. No murmur heard.   Pulmonary:     Effort: Pulmonary effort is normal. No respiratory distress.     Breath sounds: Normal breath sounds. No wheezing.  Musculoskeletal:     Comments: No lumbar tenderness to  palpation  Neurological:     Deep Tendon Reflexes:     Reflex Scores:      Patellar reflexes are 2+ on the right side and 2+ on the left side.    Comments: Bilateral LE strength is 5/5  Psychiatric:        Behavior: Behavior normal.        Thought Content: Thought content normal.        Judgment: Judgment normal.           Assessment & Plan:  Depression- uncontrolled. Will add trial of wellbutin xl 150 mg. Depression screen [facility] 2/9 05/30/2020 04/27/2019 01/01/2019 05/23/2018 04/25/2018  Decreased Interest 2 0 3 0 0  Down, Depressed, Hopeless 1 0 3 0 1  PHQ - 2 Score 3 0 6 0 1  Altered sleeping 3 - 3 1 -  Tired, decreased energy 3 - 3 0 -  Change in appetite 3 - 3 0 -  Feeling bad or failure about yourself  1 - 3 0 -  Trouble concentrating 0 - 1 0 -  Moving slowly or fidgety/restless 1 - 3 0 -  Suicidal thoughts 0 - 3 0 -  PHQ-9 Score 14 - 25 1 -  Difficult doing work/chores Very difficult - Somewhat difficult Not difficult at all -  Some recent data might be hidden   Lumbar radiculopathy- will give a prednisone taper- see rx for details.    Fatigue- I think that this is most likely stress related. She wakes up frequently in the night to use the bathroom and then her cats wake her for breakfast.  Her neighbor upstairs is also very loud which disturbs her sleep.  She had CMET, CBC recently which were unremarkable. TSH was wnl last time it was checked. Will see how she feels after we get her started on wellbutrin.  This visit occurred during the SARS-CoV-2 public health emergency.  Safety protocols were in place, including screening questions prior to the visit, additional usage of staff PPE, and extensive cleaning of exam room while observing appropriate contact time as indicated for disinfecting solutions.

## 2020-05-30 NOTE — Patient Instructions (Signed)
Please begin prednisone for your leg pain. For depression, please begin wellbutrin once daily in the AM.

## 2020-06-02 ENCOUNTER — Other Ambulatory Visit: Payer: Self-pay

## 2020-06-02 ENCOUNTER — Ambulatory Visit (HOSPITAL_BASED_OUTPATIENT_CLINIC_OR_DEPARTMENT_OTHER)
Admission: RE | Admit: 2020-06-02 | Discharge: 2020-06-02 | Disposition: A | Discharge status: Home / Self Care | Payer: Medicare Other | Source: Ambulatory Visit | Attending: Family | Admitting: Family

## 2020-06-02 ENCOUNTER — Encounter (HOSPITAL_BASED_OUTPATIENT_CLINIC_OR_DEPARTMENT_OTHER): Payer: Self-pay

## 2020-06-02 DIAGNOSIS — Z1231 Encounter for screening mammogram for malignant neoplasm of breast: Secondary | ICD-10-CM | POA: Diagnosis not present

## 2020-06-06 ENCOUNTER — Encounter: Payer: Self-pay | Admitting: Family

## 2020-06-09 ENCOUNTER — Other Ambulatory Visit: Payer: Self-pay | Admitting: Family

## 2020-06-09 DIAGNOSIS — R928 Other abnormal and inconclusive findings on diagnostic imaging of breast: Secondary | ICD-10-CM

## 2020-06-09 MED ORDER — DULOXETINE HCL 30 MG PO CPEP: 90.0000 mg | ORAL_CAPSULE | Freq: Every day | ORAL | 1 refills | Status: DC

## 2020-06-12 ENCOUNTER — Ambulatory Visit
Admission: RE | Admit: 2020-06-12 | Discharge: 2020-06-12 | Disposition: A | Discharge status: Home / Self Care | Payer: Medicare Other | Source: Ambulatory Visit | Attending: Family | Admitting: Family

## 2020-06-12 ENCOUNTER — Other Ambulatory Visit: Payer: Self-pay | Admitting: Family

## 2020-06-12 ENCOUNTER — Other Ambulatory Visit: Payer: Self-pay

## 2020-06-12 DIAGNOSIS — R928 Other abnormal and inconclusive findings on diagnostic imaging of breast: Secondary | ICD-10-CM

## 2020-06-12 DIAGNOSIS — R921 Mammographic calcification found on diagnostic imaging of breast: Secondary | ICD-10-CM | POA: Diagnosis not present

## 2020-06-17 ENCOUNTER — Telehealth: Payer: Self-pay | Admitting: Internal Medicine

## 2020-06-17 DIAGNOSIS — D0511 Intraductal carcinoma in situ of right breast: Secondary | ICD-10-CM

## 2020-06-24 ENCOUNTER — Ambulatory Visit
Admission: RE | Admit: 2020-06-24 | Discharge: 2020-06-24 | Disposition: A | Discharge status: Home / Self Care | Payer: Medicare Other | Source: Ambulatory Visit | Attending: Family | Admitting: Family

## 2020-06-24 ENCOUNTER — Other Ambulatory Visit: Payer: Self-pay | Admitting: Family

## 2020-06-24 ENCOUNTER — Other Ambulatory Visit: Payer: Self-pay

## 2020-06-24 DIAGNOSIS — R928 Other abnormal and inconclusive findings on diagnostic imaging of breast: Secondary | ICD-10-CM

## 2020-06-24 DIAGNOSIS — D0511 Intraductal carcinoma in situ of right breast: Secondary | ICD-10-CM | POA: Diagnosis not present

## 2020-06-24 DIAGNOSIS — R921 Mammographic calcification found on diagnostic imaging of breast: Secondary | ICD-10-CM | POA: Diagnosis not present

## 2020-06-25 ENCOUNTER — Telehealth: Payer: Self-pay | Admitting: Family

## 2020-06-25 DIAGNOSIS — D0511 Intraductal carcinoma in situ of right breast: Secondary | ICD-10-CM

## 2020-06-25 NOTE — Telephone Encounter (Signed)
See HPI

## 2020-06-25 NOTE — Telephone Encounter (Signed)
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  I have placed referral to [facility].

## 2020-06-26 ENCOUNTER — Encounter: Payer: Self-pay | Admitting: *Deleted

## 2020-06-26 DIAGNOSIS — F4310 Post-traumatic stress disorder, unspecified: Secondary | ICD-10-CM | POA: Diagnosis not present

## 2020-06-26 NOTE — Progress Notes (Signed)
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 Dr Marin Olp sees these patient about 4 weeks after surgery. Per chart review, surgical referral has been ordered.   Reached out to [name] to introduce myself as the office RN Navigator and explain our new patient process. Reviewed the reason for their referral. Provided address and directions to the office including call back phone number. Reviewed with patient any concerns they may have or any possible barriers to attending their appointment.   Explained to the patient that we will wait until she has a surgical date prior to scheduling an appointment here. I will reach out to her next week to make sure she has been scheduled with surgery.   All information also sent via MyChart. Informed patient about my role as a navigator. At this time patient has no further questions or needs.   Oncology Nurse Navigator Documentation  Oncology Nurse Navigator Flowsheets 06/26/2020  Abnormal Finding Date 06/05/2020  Confirmed Diagnosis Date 06/24/2020  Navigator Follow Up Date: 07/01/2020  Navigator Follow Up Reason: Appointment Review  Navigator Location [facility]  Referral Date to RadOnc/MedOnc 06/25/2020  Navigator Encounter Type Introductory Phone Call  Patient Visit Type MedOnc  Treatment Phase Pre-Tx/Tx Discussion  Barriers/Navigation Needs Coordination of Care;Education  Education Other  Interventions Education  Acuity Level 2-Minimal Needs (1-2 Barriers Identified)  Education Method Verbal;Written  Time Spent with Patient 30

## 2020-07-01 ENCOUNTER — Encounter: Payer: Self-pay | Admitting: *Deleted

## 2020-07-01 ENCOUNTER — Encounter: Payer: Self-pay | Admitting: Family

## 2020-07-01 ENCOUNTER — Other Ambulatory Visit: Payer: Self-pay

## 2020-07-01 ENCOUNTER — Ambulatory Visit (INDEPENDENT_AMBULATORY_CARE_PROVIDER_SITE_OTHER): Payer: Medicare Other | Admitting: Family

## 2020-07-01 VITALS — BP 147/73 | HR 81 | Temp 98.5°F | Resp 16 | Ht 64.0 in | Wt 153.0 lb

## 2020-07-01 DIAGNOSIS — D0510 Intraductal carcinoma in situ of unspecified breast: Secondary | ICD-10-CM

## 2020-07-01 NOTE — Progress Notes (Signed)
Patient has an appointment with a surgeon on 07/16/2020. I will call the first week in January to determine if patient has a surgical date. Will schedule appointment with Dr Marin Olp when a surgical date is set.  Oncology Nurse Navigator Documentation  Oncology Nurse Navigator Flowsheets 07/01/2020  Abnormal Finding Date -  Confirmed Diagnosis Date -  Navigator Follow Up Date: 07/18/2020  Navigator Follow Up Reason: Appointment Review  Navigator Location [facility]  Referral Date to RadOnc/MedOnc -  Navigator Encounter Type Telephone  Telephone Asess Navigation Needs;Outgoing Call  Patient Visit Type MedOnc  Treatment Phase Pre-Tx/Tx Discussion  Barriers/Navigation Needs Coordination of Care;Education  Education Other  Interventions Education;Psycho-Social Support  Acuity Level 2-Minimal Needs (1-2 Barriers Identified)  Education Method Verbal  Support Groups/Services Friends and Family  Time Spent with Patient 30

## 2020-07-01 NOTE — Progress Notes (Signed)
Subjective:    Patient ID: Vanessa Oliver, female    DOB: 11-May-1951, 69 y.o.   MRN: [id number]  HPI  Patient is a 69 yr old female who presents today for follow up.  Since her last visit she has had an abnormal mammogram and had a biopsy which confirmed DCIS.  She has an appointment on 1/5 with breast surgeon to discuss surgical excisionand then she will see the oncologist following her breast surgery.     Review of Systems See HPI  Past Medical History:  Diagnosis Date  . Allergy    takes Singulair at night  . Anxiety   . Arthritis   . Cataract   . Chronic back pain    stenosis  . Depression    takes CYmbalta daily  . Esophageal stricture   . Family history of adverse reaction to anesthesia    oldest brother had trouble with anesthesia a long time ago but can't recall what  . Fibromyalgia   . Fibromyalgia   . GERD (gastroesophageal reflux disease)    takes Omeprazole daily  . Hemorrhoids   . History of bronchitis   . Hyperlipidemia   . IBS (irritable bowel syndrome)   . Joint pain   . Joint swelling   . Plantar fasciitis   . Rosacea   . Rosacea   . Sleep apnea    cpap- settings at 10 breathing   . TMJ (dislocation of temporomandibular joint)   . TMJ (temporomandibular joint disorder)   . Vasovagal episode back in the 80's  . Vasovagal syncope   . Weakness    right leg     Social History   Socioeconomic History  . Marital status: Widowed    Spouse name: Not on file  . Number of children: 0  . Years of education: Not on file  . Highest education level: Not on file  Occupational History  . Occupation: Hydrologist: [employer]  Tobacco Use  . Smoking status: Never Smoker  . Smokeless tobacco: Never Used  Vaping Use  . Vaping Use: Never used  Substance and Sexual Activity  . Alcohol use: Yes    Alcohol/week: 0.0 standard drinks    Comment: rarely  . Drug use: No  . Sexual activity: Never    Birth control/protection: Surgical  Other  Topics Concern  . Not on file  Social History Narrative   Works as an Glass blower/designer   Married- husband is 63 years older than her   Former Dance movement psychotherapist up up [employer]   Has cats   Enjoys reading   No children   Social Determinants of Radio broadcast assistant Strain: Not on [employer] Insecurity: Not on file  Transportation Needs: Not on file  Physical Activity: Not on file  Stress: Not on file  Social Connections: Not on file  Intimate Partner Violence: Not on file    Past Surgical History:  Procedure Laterality Date  . ABDOMINAL HYSTERECTOMY  1997  . CARPAL TUNNEL RELEASE Bilateral 2004  . CARPOMETACARPAL (CMC) FUSION OF THUMB  2015   thumb  . CATARACT EXTRACTION Right 2013   Dr Ellison Hughs  . CATARACT EXTRACTION Left 04/2016  . COLONOSCOPY    . COLONOSCOPY WITH ESOPHAGOGASTRODUODENOSCOPY (EGD) AND ESOPHAGEAL DILATION (ED)    . EYE SURGERY     scar tissue removed after cataract surgery  . 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 KNEE ARTHROSCOPY  Right 1994   x 3  . KNEE SURGERY Right 01/21/2006  . LUMBAR LAMINECTOMY/DECOMPRESSION MICRODISCECTOMY Right 02/11/2015   Procedure: Laminectomy and Foraminotomy - Right - L3-L4;  Surgeon: Earnie Larsson, MD;  Location: [facility];  Service: Neurosurgery;  Laterality: Right;  Laminectomy and Foraminotomy - Right - L3-L4  . mallet finger Right 2011  . NASAL SEPTUM SURGERY  1970  . NISSEN FUNDOPLICATION  5573  . RETINAL LASER PROCEDURE Right 02/01/2011   right eye   . TONSILLECTOMY  1981  . TOTAL KNEE ARTHROPLASTY Right 09/06/2016   Procedure: RIGHT TOTAL KNEE ARTHROPLASTY;  Surgeon: Gaynelle Arabian, MD;  Location: [facility];  Service: Orthopedics;  Laterality: Right;  . TRIGGER FINGER RELEASE Bilateral 2005  . UPPER GASTROINTESTINAL ENDOSCOPY    . VARICOSE VEIN SURGERY Bilateral 2007, 2009   left 2007, right 2009  . VITRECTOMY Right 2012    Family History  Problem Relation Age of Onset  . Breast cancer Mother   .  Arthritis Mother   . Hyperlipidemia Mother   . Hypertension Mother   . Allergic rhinitis Mother   . Urticaria Mother   . Lung cancer Father   . Pancreatic cancer Father   . Skin cancer Father   . Arthritis Father   . Allergic rhinitis Father   . Emphysema Father   . Lymphoma Brother   . Celiac disease Brother   . Hypertension Brother   . Thyroid disease Brother   . Allergic rhinitis Brother   . Hyperlipidemia Maternal Grandfather   . Heart disease Maternal Grandfather   . Stroke Maternal Grandfather   . Eczema Maternal Aunt   . Asthma Neg Hx   . Immunodeficiency Neg Hx   . Angioedema Neg Hx   . Colon cancer Neg Hx   . Colon polyps Neg Hx   . Esophageal cancer Neg Hx   . Rectal cancer Neg Hx   . Stomach cancer Neg Hx     Allergies  Allergen Reactions  . Aleve [Naproxen Sodium]     Swelling, itching  . Cefuroxime Axetil     Hives / "throat swelling"  . Chlorzoxazone Rash and Other (See Comments)    "breathing problems"   . Oxycodone     Hallucinations, rash  . Penicillins Anaphylaxis and Hives    Has patient had a PCN reaction causing immediate rash, facial/tongue/throat swelling, SOB or lightheadedness with hypotension: Yes Has patient had a PCN reaction causing severe rash involving mucus membranes or skin necrosis: No Has patient had a PCN reaction that required hospitalization No Has patient had a PCN reaction occurring within the last 10 years: No If all of the above answers are "NO", then may proceed with Cephalosporin use.   . Adhesive [Tape]     rash  . Bupropion     palpitations  . Cataflam [Diclofenac Potassium]     Lip swelling  . Clarithromycin Diarrhea  . Flagyl [Metronidazole Hcl]     ?diarrhea  . Fluzone [Influenza Virus Vaccine]     Had local redness with high dose.   . Glucosamine Hives  . Guaifenesin & Derivatives     Stomach upset  . Hydrocodone-Acetaminophen     REACTION: rash, tightening of throat  . Ketorolac Tromethamine      ?reaction  . Pentazocine     Involuntary twitching  . Pneumovax [Pneumococcal Polysaccharide Vaccine] Other (See Comments)    Redness/swelling at site, nausea  . Prevnar [Pneumococcal 13-Val Conj Vacc] Other (See Comments)  Swelling, redness, nausea  . Smz-Tmp Ds [Sulfamethoxazole W/Trimethoprim (Co-Trimoxazole)]     ?unknown reaction  . Tramadol     constipation  . Trazodone And Nefazodone Other (See Comments)    Scratchy throat / congestion  . Latex Rash    Current Outpatient Medications on File Prior to Visit  Medication Sig Dispense Refill  . Azelaic Acid 15 % cream Apply twice daily to clean dry face 150 g 1  . Black Elderberry,Berry-Flower, 575 MG CAPS Take by mouth.    . Calcium Citrate 250 MG TABS Take 1 tablet (250 mg total) by mouth in the morning and at bedtime.  0  . cholecalciferol (VITAMIN D) 1000 units tablet Take 1 tablet (1,000 Units total) by mouth 2 (two) times daily.    . clobetasol cream (TEMOVATE) 6.29 % Apply 1 application topically 2 (two) times daily. 30 g 3  . Coenzyme Q10 200 MG capsule Take 200 mg by mouth daily.    . cyclobenzaprine (FLEXERIL) 10 MG tablet Take 1 tablet (10 mg total) by mouth daily as needed for muscle spasms. 30 tablet 3  . diclofenac Sodium (VOLTAREN) 1 % GEL APPLY 2 GRAMS TOPICALLY TWICE A DAY AS NEEDED 300 g 3  . DULoxetine (CYMBALTA) 30 MG capsule Take 3 capsules (90 mg total) by mouth daily. 270 capsule 1  . EPINEPHrine (EPIPEN 2-PAK) 0.3 mg/0.3 mL IJ SOAJ injection Inject 0.3 mLs (0.3 mg total) into the muscle once. 1 Device 1  . famotidine (PEPCID) 20 mg Inject 20 mg into the vein 1 day or 1 dose.    Marland Kitchen FIBER PO Take 2 capsules by mouth 2 (two) times daily.    . fluticasone (FLONASE) 50 MCG/ACT nasal spray USE 1 SPRAY IN EACH NOSTRIL DAILY 48 g 0  . Misc Natural Products (TART CHERRY ADVANCED) CAPS 1200mg  by mouth twice daily    . Multiple Vitamin (MULTIVITAMIN) tablet Take 1 tablet by mouth daily.    Marland Kitchen omeprazole (PRILOSEC) 40  MG capsule TAKE 1 CAPSULE DAILY 90 capsule 3  . Probiotic Product (PROBIOTIC DAILY PO) Take 1 capsule by mouth daily.    . rosuvastatin (CRESTOR) 5 MG tablet Take 1 tablet (5 mg total) by mouth daily. 90 tablet 1  . vitamin C (ASCORBIC ACID) 500 MG tablet Take 500 mg by mouth daily.    Marland Kitchen Zoster Vaccine Adjuvanted [facility]) injection Inject 0.5mg  IM now and again in 2-6 months. 0.5 mL 1   No current facility-administered medications on file prior to visit.    BP (!) 147/73 (BP Location: Right Arm, Patient Position: Sitting, Cuff Size: Small)   Pulse 81   Temp 98.5 F (36.9 C) (Oral)   Resp 16   Ht 5\' 4"  (1.626 m)   Wt 153 lb (69.4 kg)   LMP 05/14/1996   SpO2 100%   BMI 26.26 kg/m       Objective:   Physical Exam Constitutional:      Appearance: She is well-developed and well-nourished.  Neck:     Thyroid: No thyromegaly.  Cardiovascular:     Rate and Rhythm: Normal rate and regular rhythm.     Heart sounds: Normal heart sounds. No murmur heard.   Pulmonary:     Effort: Pulmonary effort is normal. No respiratory distress.     Breath sounds: Normal breath sounds. No wheezing.  Musculoskeletal:     Cervical back: Neck supple.  Skin:    General: Skin is warm and dry.  Neurological:  Mental Status: She is alert and oriented to person, place, and time.  Psychiatric:        Mood and Affect: Mood and affect normal.        Behavior: Behavior normal.        Thought Content: Thought content normal.        Judgment: Judgment normal.           Assessment & Plan:  DCIS-  She will establish with breast surgeon on 07/17/19.  Oncology plan will be determined based on surgical findings.  She will plan to follow up with me in 3 months.  Support provided.   This visit occurred during the SARS-CoV-2 public health emergency.  Safety protocols were in place, including screening questions prior to the visit, additional usage of staff PPE, and extensive cleaning of exam room while  observing appropriate contact time as indicated for disinfecting solutions.

## 2020-07-03 ENCOUNTER — Telehealth: Payer: Self-pay | Admitting: Internal Medicine

## 2020-07-03 NOTE — Telephone Encounter (Signed)
Absolutely.

## 2020-07-03 NOTE — Telephone Encounter (Addendum)
Thank you both.  Patient scheduled for [facility] location with Dr. Bryan Lemma on 08/01/20.

## 2020-07-03 NOTE — Telephone Encounter (Signed)
Good afternoon Dr. Henrene Pastor, this patient is wanting to transfer her care to Dr. Bryan Lemma.  She moved to [facility], [place] and that location is closer to her now.   Would this be ok with you?

## 2020-07-03 NOTE — Telephone Encounter (Signed)
Good afternoon Dr. Bryan Lemma, would it be ok if I scheduled this patient with you for an office visit?

## 2020-07-03 NOTE — Telephone Encounter (Signed)
No problem.

## 2020-07-16 DIAGNOSIS — D0511 Intraductal carcinoma in situ of right breast: Secondary | ICD-10-CM | POA: Diagnosis not present

## 2020-07-17 DIAGNOSIS — F4310 Post-traumatic stress disorder, unspecified: Secondary | ICD-10-CM | POA: Diagnosis not present

## 2020-07-18 ENCOUNTER — Encounter: Payer: Self-pay | Admitting: *Deleted

## 2020-07-18 ENCOUNTER — Ambulatory Visit: Payer: Medicare Other | Admitting: Gastroenterology

## 2020-07-18 DIAGNOSIS — R928 Other abnormal and inconclusive findings on diagnostic imaging of breast: Secondary | ICD-10-CM | POA: Diagnosis not present

## 2020-07-18 NOTE — Progress Notes (Signed)
Called patient regarding scheduling her new patient appointment after surgery scheduled for 07/24/2020. She states she will keep all her care at [facility]. I will cancel her referral and end navigation.   Oncology Nurse Navigator Documentation  Oncology Nurse Navigator Flowsheets 07/18/2020  Abnormal Finding Date -  Confirmed Diagnosis Date -  Navigator Follow Up Date: -  Navigator Follow Up Reason: -  Navigation Complete Date: 07/18/2020  Post Navigation: Continue to Follow Patient? No  Reason Not Navigating Patient: Seeking Care elsewhere  Navigator Location [facility]  Referral Date to RadOnc/MedOnc -  Navigator Encounter Type Telephone  Telephone Appt Confirmation/Clarification  Patient Visit Type MedOnc  Treatment Phase Pre-Tx/Tx Discussion  Barriers/Navigation Needs Coordination of Care;Education  Education -  Interventions Psycho-Social Support  Acuity Level 2-Minimal Needs (1-2 Barriers Identified)  Education Method -  Support Groups/Services Friends and Family  Time Spent with Patient 30

## 2020-07-22 DIAGNOSIS — Z888 Allergy status to other drugs, medicaments and biological substances status: Secondary | ICD-10-CM | POA: Diagnosis not present

## 2020-07-22 DIAGNOSIS — Z881 Allergy status to other antibiotic agents status: Secondary | ICD-10-CM | POA: Diagnosis not present

## 2020-07-22 DIAGNOSIS — D0511 Intraductal carcinoma in situ of right breast: Secondary | ICD-10-CM | POA: Diagnosis not present

## 2020-07-22 DIAGNOSIS — Z880 Allergy status to penicillin: Secondary | ICD-10-CM | POA: Diagnosis not present

## 2020-07-22 DIAGNOSIS — Z885 Allergy status to narcotic agent status: Secondary | ICD-10-CM | POA: Diagnosis not present

## 2020-07-24 DIAGNOSIS — D0511 Intraductal carcinoma in situ of right breast: Secondary | ICD-10-CM | POA: Diagnosis not present

## 2020-07-24 DIAGNOSIS — N62 Hypertrophy of breast: Secondary | ICD-10-CM | POA: Diagnosis not present

## 2020-07-24 DIAGNOSIS — N641 Fat necrosis of breast: Secondary | ICD-10-CM | POA: Diagnosis not present

## 2020-07-24 DIAGNOSIS — R920 Mammographic microcalcification found on diagnostic imaging of breast: Secondary | ICD-10-CM | POA: Diagnosis not present

## 2020-07-24 DIAGNOSIS — N6489 Other specified disorders of breast: Secondary | ICD-10-CM | POA: Diagnosis not present

## 2020-07-24 DIAGNOSIS — D0501 Lobular carcinoma in situ of right breast: Secondary | ICD-10-CM | POA: Diagnosis not present

## 2020-07-24 DIAGNOSIS — D241 Benign neoplasm of right breast: Secondary | ICD-10-CM | POA: Diagnosis not present

## 2020-07-31 DIAGNOSIS — F4310 Post-traumatic stress disorder, unspecified: Secondary | ICD-10-CM | POA: Diagnosis not present

## 2020-08-01 ENCOUNTER — Ambulatory Visit: Payer: Medicare Other | Admitting: Gastroenterology

## 2020-08-14 DIAGNOSIS — F4310 Post-traumatic stress disorder, unspecified: Secondary | ICD-10-CM | POA: Diagnosis not present

## 2020-08-21 DIAGNOSIS — D0511 Intraductal carcinoma in situ of right breast: Secondary | ICD-10-CM | POA: Diagnosis not present

## 2020-08-23 ENCOUNTER — Other Ambulatory Visit: Payer: Self-pay | Admitting: Family

## 2020-08-26 DIAGNOSIS — Z510 Encounter for antineoplastic radiation therapy: Secondary | ICD-10-CM | POA: Diagnosis not present

## 2020-08-26 DIAGNOSIS — Z961 Presence of intraocular lens: Secondary | ICD-10-CM | POA: Diagnosis not present

## 2020-08-26 DIAGNOSIS — D0511 Intraductal carcinoma in situ of right breast: Secondary | ICD-10-CM | POA: Diagnosis not present

## 2020-08-26 DIAGNOSIS — H35371 Puckering of macula, right eye: Secondary | ICD-10-CM | POA: Diagnosis not present

## 2020-08-26 DIAGNOSIS — H04123 Dry eye syndrome of bilateral lacrimal glands: Secondary | ICD-10-CM | POA: Diagnosis not present

## 2020-08-28 DIAGNOSIS — F4310 Post-traumatic stress disorder, unspecified: Secondary | ICD-10-CM | POA: Diagnosis not present

## 2020-09-01 DIAGNOSIS — D0511 Intraductal carcinoma in situ of right breast: Secondary | ICD-10-CM | POA: Diagnosis not present

## 2020-09-01 DIAGNOSIS — Z510 Encounter for antineoplastic radiation therapy: Secondary | ICD-10-CM | POA: Diagnosis not present

## 2020-09-04 DIAGNOSIS — Z780 Asymptomatic menopausal state: Secondary | ICD-10-CM | POA: Diagnosis not present

## 2020-09-04 DIAGNOSIS — D0511 Intraductal carcinoma in situ of right breast: Secondary | ICD-10-CM | POA: Diagnosis not present

## 2020-09-04 DIAGNOSIS — Z170 Estrogen receptor positive status [ER+]: Secondary | ICD-10-CM | POA: Diagnosis not present

## 2020-09-07 ENCOUNTER — Encounter: Payer: Self-pay | Admitting: Family

## 2020-09-09 DIAGNOSIS — R5383 Other fatigue: Secondary | ICD-10-CM | POA: Diagnosis not present

## 2020-09-09 DIAGNOSIS — D0511 Intraductal carcinoma in situ of right breast: Secondary | ICD-10-CM | POA: Diagnosis not present

## 2020-09-09 DIAGNOSIS — L299 Pruritus, unspecified: Secondary | ICD-10-CM | POA: Diagnosis not present

## 2020-09-09 DIAGNOSIS — Z510 Encounter for antineoplastic radiation therapy: Secondary | ICD-10-CM | POA: Diagnosis not present

## 2020-09-09 DIAGNOSIS — L539 Erythematous condition, unspecified: Secondary | ICD-10-CM | POA: Diagnosis not present

## 2020-09-10 DIAGNOSIS — D0511 Intraductal carcinoma in situ of right breast: Secondary | ICD-10-CM | POA: Diagnosis not present

## 2020-09-10 DIAGNOSIS — R5383 Other fatigue: Secondary | ICD-10-CM | POA: Diagnosis not present

## 2020-09-10 DIAGNOSIS — L299 Pruritus, unspecified: Secondary | ICD-10-CM | POA: Diagnosis not present

## 2020-09-10 DIAGNOSIS — L539 Erythematous condition, unspecified: Secondary | ICD-10-CM | POA: Diagnosis not present

## 2020-09-10 DIAGNOSIS — Z510 Encounter for antineoplastic radiation therapy: Secondary | ICD-10-CM | POA: Diagnosis not present

## 2020-09-11 DIAGNOSIS — Z510 Encounter for antineoplastic radiation therapy: Secondary | ICD-10-CM | POA: Diagnosis not present

## 2020-09-11 DIAGNOSIS — F4310 Post-traumatic stress disorder, unspecified: Secondary | ICD-10-CM | POA: Diagnosis not present

## 2020-09-11 DIAGNOSIS — D0511 Intraductal carcinoma in situ of right breast: Secondary | ICD-10-CM | POA: Diagnosis not present

## 2020-09-11 DIAGNOSIS — R5383 Other fatigue: Secondary | ICD-10-CM | POA: Diagnosis not present

## 2020-09-11 DIAGNOSIS — L299 Pruritus, unspecified: Secondary | ICD-10-CM | POA: Diagnosis not present

## 2020-09-11 DIAGNOSIS — L539 Erythematous condition, unspecified: Secondary | ICD-10-CM | POA: Diagnosis not present

## 2020-09-12 DIAGNOSIS — D0511 Intraductal carcinoma in situ of right breast: Secondary | ICD-10-CM | POA: Diagnosis not present

## 2020-09-12 DIAGNOSIS — L299 Pruritus, unspecified: Secondary | ICD-10-CM | POA: Diagnosis not present

## 2020-09-12 DIAGNOSIS — L539 Erythematous condition, unspecified: Secondary | ICD-10-CM | POA: Diagnosis not present

## 2020-09-12 DIAGNOSIS — R5383 Other fatigue: Secondary | ICD-10-CM | POA: Diagnosis not present

## 2020-09-12 DIAGNOSIS — Z510 Encounter for antineoplastic radiation therapy: Secondary | ICD-10-CM | POA: Diagnosis not present

## 2020-09-15 DIAGNOSIS — L299 Pruritus, unspecified: Secondary | ICD-10-CM | POA: Diagnosis not present

## 2020-09-15 DIAGNOSIS — R5383 Other fatigue: Secondary | ICD-10-CM | POA: Diagnosis not present

## 2020-09-15 DIAGNOSIS — L539 Erythematous condition, unspecified: Secondary | ICD-10-CM | POA: Diagnosis not present

## 2020-09-15 DIAGNOSIS — D0511 Intraductal carcinoma in situ of right breast: Secondary | ICD-10-CM | POA: Diagnosis not present

## 2020-09-15 DIAGNOSIS — Z510 Encounter for antineoplastic radiation therapy: Secondary | ICD-10-CM | POA: Diagnosis not present

## 2020-09-16 DIAGNOSIS — L539 Erythematous condition, unspecified: Secondary | ICD-10-CM | POA: Diagnosis not present

## 2020-09-16 DIAGNOSIS — Z510 Encounter for antineoplastic radiation therapy: Secondary | ICD-10-CM | POA: Diagnosis not present

## 2020-09-16 DIAGNOSIS — R5383 Other fatigue: Secondary | ICD-10-CM | POA: Diagnosis not present

## 2020-09-16 DIAGNOSIS — L299 Pruritus, unspecified: Secondary | ICD-10-CM | POA: Diagnosis not present

## 2020-09-16 DIAGNOSIS — D0511 Intraductal carcinoma in situ of right breast: Secondary | ICD-10-CM | POA: Diagnosis not present

## 2020-09-17 DIAGNOSIS — Z510 Encounter for antineoplastic radiation therapy: Secondary | ICD-10-CM | POA: Diagnosis not present

## 2020-09-17 DIAGNOSIS — R5383 Other fatigue: Secondary | ICD-10-CM | POA: Diagnosis not present

## 2020-09-17 DIAGNOSIS — L299 Pruritus, unspecified: Secondary | ICD-10-CM | POA: Diagnosis not present

## 2020-09-17 DIAGNOSIS — D0511 Intraductal carcinoma in situ of right breast: Secondary | ICD-10-CM | POA: Diagnosis not present

## 2020-09-17 DIAGNOSIS — L539 Erythematous condition, unspecified: Secondary | ICD-10-CM | POA: Diagnosis not present

## 2020-09-18 DIAGNOSIS — Z510 Encounter for antineoplastic radiation therapy: Secondary | ICD-10-CM | POA: Diagnosis not present

## 2020-09-18 DIAGNOSIS — L539 Erythematous condition, unspecified: Secondary | ICD-10-CM | POA: Diagnosis not present

## 2020-09-18 DIAGNOSIS — D0511 Intraductal carcinoma in situ of right breast: Secondary | ICD-10-CM | POA: Diagnosis not present

## 2020-09-18 DIAGNOSIS — L299 Pruritus, unspecified: Secondary | ICD-10-CM | POA: Diagnosis not present

## 2020-09-18 DIAGNOSIS — R5383 Other fatigue: Secondary | ICD-10-CM | POA: Diagnosis not present

## 2020-09-19 DIAGNOSIS — L299 Pruritus, unspecified: Secondary | ICD-10-CM | POA: Diagnosis not present

## 2020-09-19 DIAGNOSIS — D0511 Intraductal carcinoma in situ of right breast: Secondary | ICD-10-CM | POA: Diagnosis not present

## 2020-09-19 DIAGNOSIS — R5383 Other fatigue: Secondary | ICD-10-CM | POA: Diagnosis not present

## 2020-09-19 DIAGNOSIS — Z510 Encounter for antineoplastic radiation therapy: Secondary | ICD-10-CM | POA: Diagnosis not present

## 2020-09-19 DIAGNOSIS — L539 Erythematous condition, unspecified: Secondary | ICD-10-CM | POA: Diagnosis not present

## 2020-09-22 DIAGNOSIS — L299 Pruritus, unspecified: Secondary | ICD-10-CM | POA: Diagnosis not present

## 2020-09-22 DIAGNOSIS — L539 Erythematous condition, unspecified: Secondary | ICD-10-CM | POA: Diagnosis not present

## 2020-09-22 DIAGNOSIS — R5383 Other fatigue: Secondary | ICD-10-CM | POA: Diagnosis not present

## 2020-09-22 DIAGNOSIS — D0511 Intraductal carcinoma in situ of right breast: Secondary | ICD-10-CM | POA: Diagnosis not present

## 2020-09-22 DIAGNOSIS — Z510 Encounter for antineoplastic radiation therapy: Secondary | ICD-10-CM | POA: Diagnosis not present

## 2020-09-23 DIAGNOSIS — L299 Pruritus, unspecified: Secondary | ICD-10-CM | POA: Diagnosis not present

## 2020-09-23 DIAGNOSIS — Z510 Encounter for antineoplastic radiation therapy: Secondary | ICD-10-CM | POA: Diagnosis not present

## 2020-09-23 DIAGNOSIS — L539 Erythematous condition, unspecified: Secondary | ICD-10-CM | POA: Diagnosis not present

## 2020-09-23 DIAGNOSIS — R5383 Other fatigue: Secondary | ICD-10-CM | POA: Diagnosis not present

## 2020-09-23 DIAGNOSIS — D0511 Intraductal carcinoma in situ of right breast: Secondary | ICD-10-CM | POA: Diagnosis not present

## 2020-09-24 DIAGNOSIS — Z510 Encounter for antineoplastic radiation therapy: Secondary | ICD-10-CM | POA: Diagnosis not present

## 2020-09-24 DIAGNOSIS — L299 Pruritus, unspecified: Secondary | ICD-10-CM | POA: Diagnosis not present

## 2020-09-24 DIAGNOSIS — L539 Erythematous condition, unspecified: Secondary | ICD-10-CM | POA: Diagnosis not present

## 2020-09-24 DIAGNOSIS — R5383 Other fatigue: Secondary | ICD-10-CM | POA: Diagnosis not present

## 2020-09-24 DIAGNOSIS — D0511 Intraductal carcinoma in situ of right breast: Secondary | ICD-10-CM | POA: Diagnosis not present

## 2020-09-25 DIAGNOSIS — D0511 Intraductal carcinoma in situ of right breast: Secondary | ICD-10-CM | POA: Diagnosis not present

## 2020-09-25 DIAGNOSIS — L299 Pruritus, unspecified: Secondary | ICD-10-CM | POA: Diagnosis not present

## 2020-09-25 DIAGNOSIS — R5383 Other fatigue: Secondary | ICD-10-CM | POA: Diagnosis not present

## 2020-09-25 DIAGNOSIS — L539 Erythematous condition, unspecified: Secondary | ICD-10-CM | POA: Diagnosis not present

## 2020-09-25 DIAGNOSIS — Z510 Encounter for antineoplastic radiation therapy: Secondary | ICD-10-CM | POA: Diagnosis not present

## 2020-09-25 DIAGNOSIS — F4310 Post-traumatic stress disorder, unspecified: Secondary | ICD-10-CM | POA: Diagnosis not present

## 2020-09-26 DIAGNOSIS — R5383 Other fatigue: Secondary | ICD-10-CM | POA: Diagnosis not present

## 2020-09-26 DIAGNOSIS — Z510 Encounter for antineoplastic radiation therapy: Secondary | ICD-10-CM | POA: Diagnosis not present

## 2020-09-26 DIAGNOSIS — D0511 Intraductal carcinoma in situ of right breast: Secondary | ICD-10-CM | POA: Diagnosis not present

## 2020-09-26 DIAGNOSIS — L299 Pruritus, unspecified: Secondary | ICD-10-CM | POA: Diagnosis not present

## 2020-09-26 DIAGNOSIS — L539 Erythematous condition, unspecified: Secondary | ICD-10-CM | POA: Diagnosis not present

## 2020-09-29 DIAGNOSIS — L539 Erythematous condition, unspecified: Secondary | ICD-10-CM | POA: Diagnosis not present

## 2020-09-29 DIAGNOSIS — Z510 Encounter for antineoplastic radiation therapy: Secondary | ICD-10-CM | POA: Diagnosis not present

## 2020-09-29 DIAGNOSIS — D0511 Intraductal carcinoma in situ of right breast: Secondary | ICD-10-CM | POA: Diagnosis not present

## 2020-09-29 DIAGNOSIS — R5383 Other fatigue: Secondary | ICD-10-CM | POA: Diagnosis not present

## 2020-09-29 DIAGNOSIS — L299 Pruritus, unspecified: Secondary | ICD-10-CM | POA: Diagnosis not present

## 2020-09-30 ENCOUNTER — Ambulatory Visit: Payer: Medicare Other | Admitting: Family

## 2020-09-30 DIAGNOSIS — D0511 Intraductal carcinoma in situ of right breast: Secondary | ICD-10-CM | POA: Diagnosis not present

## 2020-09-30 DIAGNOSIS — R5383 Other fatigue: Secondary | ICD-10-CM | POA: Diagnosis not present

## 2020-09-30 DIAGNOSIS — Z510 Encounter for antineoplastic radiation therapy: Secondary | ICD-10-CM | POA: Diagnosis not present

## 2020-09-30 DIAGNOSIS — L299 Pruritus, unspecified: Secondary | ICD-10-CM | POA: Diagnosis not present

## 2020-09-30 DIAGNOSIS — L539 Erythematous condition, unspecified: Secondary | ICD-10-CM | POA: Diagnosis not present

## 2020-10-01 DIAGNOSIS — L539 Erythematous condition, unspecified: Secondary | ICD-10-CM | POA: Diagnosis not present

## 2020-10-01 DIAGNOSIS — L299 Pruritus, unspecified: Secondary | ICD-10-CM | POA: Diagnosis not present

## 2020-10-01 DIAGNOSIS — Z510 Encounter for antineoplastic radiation therapy: Secondary | ICD-10-CM | POA: Diagnosis not present

## 2020-10-01 DIAGNOSIS — R5383 Other fatigue: Secondary | ICD-10-CM | POA: Diagnosis not present

## 2020-10-01 DIAGNOSIS — D0511 Intraductal carcinoma in situ of right breast: Secondary | ICD-10-CM | POA: Diagnosis not present

## 2020-10-02 DIAGNOSIS — L299 Pruritus, unspecified: Secondary | ICD-10-CM | POA: Diagnosis not present

## 2020-10-02 DIAGNOSIS — Z510 Encounter for antineoplastic radiation therapy: Secondary | ICD-10-CM | POA: Diagnosis not present

## 2020-10-02 DIAGNOSIS — L539 Erythematous condition, unspecified: Secondary | ICD-10-CM | POA: Diagnosis not present

## 2020-10-02 DIAGNOSIS — D0511 Intraductal carcinoma in situ of right breast: Secondary | ICD-10-CM | POA: Diagnosis not present

## 2020-10-02 DIAGNOSIS — R5383 Other fatigue: Secondary | ICD-10-CM | POA: Diagnosis not present

## 2020-10-03 DIAGNOSIS — L299 Pruritus, unspecified: Secondary | ICD-10-CM | POA: Diagnosis not present

## 2020-10-03 DIAGNOSIS — D0511 Intraductal carcinoma in situ of right breast: Secondary | ICD-10-CM | POA: Diagnosis not present

## 2020-10-03 DIAGNOSIS — L539 Erythematous condition, unspecified: Secondary | ICD-10-CM | POA: Diagnosis not present

## 2020-10-03 DIAGNOSIS — Z510 Encounter for antineoplastic radiation therapy: Secondary | ICD-10-CM | POA: Diagnosis not present

## 2020-10-03 DIAGNOSIS — R5383 Other fatigue: Secondary | ICD-10-CM | POA: Diagnosis not present

## 2020-10-06 DIAGNOSIS — L539 Erythematous condition, unspecified: Secondary | ICD-10-CM | POA: Diagnosis not present

## 2020-10-06 DIAGNOSIS — Z510 Encounter for antineoplastic radiation therapy: Secondary | ICD-10-CM | POA: Diagnosis not present

## 2020-10-06 DIAGNOSIS — L299 Pruritus, unspecified: Secondary | ICD-10-CM | POA: Diagnosis not present

## 2020-10-06 DIAGNOSIS — D0511 Intraductal carcinoma in situ of right breast: Secondary | ICD-10-CM | POA: Diagnosis not present

## 2020-10-06 DIAGNOSIS — R5383 Other fatigue: Secondary | ICD-10-CM | POA: Diagnosis not present

## 2020-10-07 DIAGNOSIS — L299 Pruritus, unspecified: Secondary | ICD-10-CM | POA: Diagnosis not present

## 2020-10-07 DIAGNOSIS — L539 Erythematous condition, unspecified: Secondary | ICD-10-CM | POA: Diagnosis not present

## 2020-10-07 DIAGNOSIS — Z510 Encounter for antineoplastic radiation therapy: Secondary | ICD-10-CM | POA: Diagnosis not present

## 2020-10-07 DIAGNOSIS — R5383 Other fatigue: Secondary | ICD-10-CM | POA: Diagnosis not present

## 2020-10-07 DIAGNOSIS — D0511 Intraductal carcinoma in situ of right breast: Secondary | ICD-10-CM | POA: Diagnosis not present

## 2020-10-09 DIAGNOSIS — F4310 Post-traumatic stress disorder, unspecified: Secondary | ICD-10-CM | POA: Diagnosis not present

## 2020-10-10 ENCOUNTER — Ambulatory Visit: Payer: Medicare Other | Admitting: Family

## 2020-10-15 DIAGNOSIS — D1801 Hemangioma of skin and subcutaneous tissue: Secondary | ICD-10-CM | POA: Diagnosis not present

## 2020-10-15 DIAGNOSIS — D229 Melanocytic nevi, unspecified: Secondary | ICD-10-CM | POA: Diagnosis not present

## 2020-10-15 DIAGNOSIS — L503 Dermatographic urticaria: Secondary | ICD-10-CM | POA: Diagnosis not present

## 2020-10-15 DIAGNOSIS — D239 Other benign neoplasm of skin, unspecified: Secondary | ICD-10-CM | POA: Diagnosis not present

## 2020-10-15 DIAGNOSIS — L720 Epidermal cyst: Secondary | ICD-10-CM | POA: Diagnosis not present

## 2020-10-15 DIAGNOSIS — L821 Other seborrheic keratosis: Secondary | ICD-10-CM | POA: Diagnosis not present

## 2020-10-21 DIAGNOSIS — Z170 Estrogen receptor positive status [ER+]: Secondary | ICD-10-CM | POA: Diagnosis not present

## 2020-10-21 DIAGNOSIS — Z923 Personal history of irradiation: Secondary | ICD-10-CM | POA: Diagnosis not present

## 2020-10-21 DIAGNOSIS — Z780 Asymptomatic menopausal state: Secondary | ICD-10-CM | POA: Diagnosis not present

## 2020-10-21 DIAGNOSIS — D0511 Intraductal carcinoma in situ of right breast: Secondary | ICD-10-CM | POA: Diagnosis not present

## 2020-10-23 DIAGNOSIS — F4310 Post-traumatic stress disorder, unspecified: Secondary | ICD-10-CM | POA: Diagnosis not present

## 2020-10-30 DIAGNOSIS — M5459 Other low back pain: Secondary | ICD-10-CM | POA: Diagnosis not present

## 2020-10-30 DIAGNOSIS — M25562 Pain in left knee: Secondary | ICD-10-CM | POA: Diagnosis not present

## 2020-11-12 DIAGNOSIS — M85852 Other specified disorders of bone density and structure, left thigh: Secondary | ICD-10-CM | POA: Diagnosis not present

## 2020-11-12 DIAGNOSIS — Z79811 Long term (current) use of aromatase inhibitors: Secondary | ICD-10-CM | POA: Diagnosis not present

## 2020-11-12 DIAGNOSIS — M85832 Other specified disorders of bone density and structure, left forearm: Secondary | ICD-10-CM | POA: Diagnosis not present

## 2020-11-12 DIAGNOSIS — Z780 Asymptomatic menopausal state: Secondary | ICD-10-CM | POA: Diagnosis not present

## 2020-11-25 DIAGNOSIS — D0511 Intraductal carcinoma in situ of right breast: Secondary | ICD-10-CM | POA: Diagnosis not present

## 2020-11-26 DIAGNOSIS — M5459 Other low back pain: Secondary | ICD-10-CM | POA: Diagnosis not present

## 2020-11-26 DIAGNOSIS — M5416 Radiculopathy, lumbar region: Secondary | ICD-10-CM | POA: Diagnosis not present

## 2020-11-29 ENCOUNTER — Other Ambulatory Visit: Payer: Self-pay | Admitting: Family

## 2020-11-29 DIAGNOSIS — E785 Hyperlipidemia, unspecified: Secondary | ICD-10-CM

## 2020-12-10 ENCOUNTER — Encounter: Payer: Self-pay | Admitting: Family

## 2020-12-10 NOTE — Telephone Encounter (Signed)
Last refilled in 2020. Please advise if refill is appropriate.   Patient takes twice daily to clean dry face.

## 2020-12-12 DIAGNOSIS — M545 Low back pain, unspecified: Secondary | ICD-10-CM | POA: Diagnosis not present

## 2020-12-12 MED ORDER — AZELAIC ACID 15 % EX GEL: CUTANEOUS | 1 refills | Status: DC

## 2020-12-17 ENCOUNTER — Ambulatory Visit (INDEPENDENT_AMBULATORY_CARE_PROVIDER_SITE_OTHER): Payer: Medicare Other | Admitting: Family

## 2020-12-17 ENCOUNTER — Encounter: Payer: Self-pay | Admitting: Family

## 2020-12-17 ENCOUNTER — Other Ambulatory Visit: Payer: Self-pay

## 2020-12-17 VITALS — BP 139/62 | HR 91 | Temp 98.7°F | Resp 16 | Wt 154.0 lb

## 2020-12-17 DIAGNOSIS — M797 Fibromyalgia: Secondary | ICD-10-CM

## 2020-12-17 DIAGNOSIS — L719 Rosacea, unspecified: Secondary | ICD-10-CM | POA: Diagnosis not present

## 2020-12-17 DIAGNOSIS — F341 Dysthymic disorder: Secondary | ICD-10-CM | POA: Diagnosis not present

## 2020-12-17 DIAGNOSIS — E785 Hyperlipidemia, unspecified: Secondary | ICD-10-CM

## 2020-12-17 MED ORDER — METRONIDAZOLE 0.75 % EX GEL: 1.0000 "application " | Freq: Two times a day (BID) | CUTANEOUS | 1 refills | Status: AC

## 2020-12-17 MED ORDER — TETANUS-DIPHTH-ACELL PERTUSSIS 5-2.5-18.5 LF-MCG/0.5 IM SUSP: 0.5000 mL | Freq: Once | INTRAMUSCULAR | 0 refills | Status: AC

## 2020-12-17 NOTE — Progress Notes (Signed)
Subjective:   By signing my name below, I, Shehryar Baig, attest that this documentation has been prepared under the direction and in the presence of Debbrah Alar NP. 12/17/2020      Patient ID: Vanessa Oliver, female    DOB: [birth year], 70 y.o.   MRN: [id number]  No chief complaint on file.   HPI Patient is in today for an office visit.  She has used azelaic acid gel for years to manage her rosacea but her insurance will no longer cover this prescription. She recently had a second pfizer Covid-19 booster vaccine on 10/13/2020. She is planning to do water physical therapy and injections to manage her back pain from "pinched nerve", scoliosis and fibromyalgia pain. She is due in August for a tetanus vaccine.  Fibromyalgia- She continues cymbalta daily.  GERD- She continues taking 40 mg omeprazole daily PO to manage her acid reflux.   Allergy- She continues using Flonase as needed.  Depression- Her depression symptoms have not improved or worsened since her last visit. She continues to be overwhelmed with her daily life. She feels as if she lost her identity after her husband passed away. 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 She recently stopped seeing her counselor due to not having extra time. She continues taking 90 mg daily PO of cymbalta.    Health Maintenance Due  Topic Date Due  . Pneumococcal Vaccine 48-4 Years old (1 of 4 - PCV13) Never done  . URINE MICROALBUMIN  Never done  . Zoster Vaccines- Shingrix (1 of 2) Never done  . COVID-19 Vaccine (4 - Booster for [employer] series) 07/14/2020    Past Medical History:  Diagnosis Date  . Allergy    takes Singulair at night  . Anxiety   . Arthritis   . Cataract   . Chronic back pain    stenosis  . Depression    takes CYmbalta daily  . Esophageal stricture   . Family history of adverse reaction to anesthesia    oldest brother had trouble with anesthesia a long time ago but can't recall what  .  Fibromyalgia   . Fibromyalgia   . GERD (gastroesophageal reflux disease)    takes Omeprazole daily  . Hemorrhoids   . History of bronchitis   . Hyperlipidemia   . IBS (irritable bowel syndrome)   . Joint pain   . Joint swelling   . Plantar fasciitis   . Rosacea   . Rosacea   . Sleep apnea    cpap- settings at 10 breathing   . TMJ (dislocation of temporomandibular joint)   . TMJ (temporomandibular joint disorder)   . Vasovagal episode back in the 80's  . Vasovagal syncope   . Weakness    right leg    Past Surgical History:  Procedure Laterality Date  . ABDOMINAL HYSTERECTOMY  1997  . CARPAL TUNNEL RELEASE Bilateral 2004  . CARPOMETACARPAL (CMC) FUSION OF THUMB  2015   thumb  . CATARACT EXTRACTION Right 2013   Dr Ellison Hughs  . CATARACT EXTRACTION Left 04/2016  . COLONOSCOPY    . COLONOSCOPY WITH ESOPHAGOGASTRODUODENOSCOPY (EGD) AND ESOPHAGEAL DILATION (ED)    . EYE SURGERY     scar tissue removed after cataract surgery  . 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 KNEE ARTHROSCOPY Right 1994   x 3  . KNEE SURGERY Right 01/21/2006  . LUMBAR LAMINECTOMY/DECOMPRESSION MICRODISCECTOMY Right 02/11/2015   Procedure: Laminectomy and Foraminotomy -  Right - L3-L4;  Surgeon: Earnie Larsson, MD;  Location: [facility];  Service: Neurosurgery;  Laterality: Right;  Laminectomy and Foraminotomy - Right - L3-L4  . mallet finger Right 2011  . NASAL SEPTUM SURGERY  1970  . NISSEN FUNDOPLICATION  7858  . RETINAL LASER PROCEDURE Right 02/01/2011   right eye   . TONSILLECTOMY  1981  . TOTAL KNEE ARTHROPLASTY Right 09/06/2016   Procedure: RIGHT TOTAL KNEE ARTHROPLASTY;  Surgeon: Gaynelle Arabian, MD;  Location: [facility];  Service: Orthopedics;  Laterality: Right;  . TRIGGER FINGER RELEASE Bilateral 2005  . UPPER GASTROINTESTINAL ENDOSCOPY    . VARICOSE VEIN SURGERY Bilateral 2007, 2009   left 2007, right 2009  . VITRECTOMY Right 2012    Family History  Problem Relation Age of Onset  .  Breast cancer Mother   . Arthritis Mother   . Hyperlipidemia Mother   . Hypertension Mother   . Allergic rhinitis Mother   . Urticaria Mother   . Lung cancer Father   . Pancreatic cancer Father   . Skin cancer Father   . Arthritis Father   . Allergic rhinitis Father   . Emphysema Father   . Lymphoma Brother   . Celiac disease Brother   . Hypertension Brother   . Thyroid disease Brother   . Allergic rhinitis Brother   . Hyperlipidemia Maternal Grandfather   . Heart disease Maternal Grandfather   . Stroke Maternal Grandfather   . Eczema Maternal Aunt   . Asthma Neg Hx   . Immunodeficiency Neg Hx   . Angioedema Neg Hx   . Colon cancer Neg Hx   . Colon polyps Neg Hx   . Esophageal cancer Neg Hx   . Rectal cancer Neg Hx   . Stomach cancer Neg Hx     Social History   Socioeconomic History  . Marital status: Widowed    Spouse name: Not on file  . Number of children: 0  . Years of education: Not on file  . Highest education level: Not on file  Occupational History  . Occupation: Hydrologist: [employer]  Tobacco Use  . Smoking status: Never Smoker  . Smokeless tobacco: Never Used  Vaping Use  . Vaping Use: Never used  Substance and Sexual Activity  . Alcohol use: Yes    Alcohol/week: 0.0 standard drinks    Comment: rarely  . Drug use: No  . Sexual activity: Never    Birth control/protection: Surgical  Other Topics Concern  . Not on file  Social History Narrative   Works as an Glass blower/designer   Married- husband is 42 years older than her   Former Dance movement psychotherapist up up [employer]   Has cats   Enjoys reading   No children   Social Determinants of Radio broadcast assistant Strain: Not on [employer] Insecurity: Not on file  Transportation Needs: Not on file  Physical Activity: Not on file  Stress: Not on file  Social Connections: Not on file  Intimate Partner Violence: Not on file    Outpatient Medications Prior to Visit  Medication Sig Dispense  Refill  . Azelaic Acid 15 % gel Apply twice daily to clean dry face 150 g 1  . Black Elderberry,Berry-Flower, 575 MG CAPS Take by mouth.    . Calcium Citrate 250 MG TABS Take 1 tablet (250 mg total) by mouth in the morning and at bedtime.  0  . cholecalciferol (VITAMIN  D) 1000 units tablet Take 1 tablet (1,000 Units total) by mouth 2 (two) times daily.    . clobetasol cream (TEMOVATE) 5.09 % Apply 1 application topically 2 (two) times daily. 30 g 3  . Coenzyme Q10 200 MG capsule Take 200 mg by mouth daily.    . cyclobenzaprine (FLEXERIL) 10 MG tablet Take 1 tablet (10 mg total) by mouth daily as needed for muscle spasms. 30 tablet 3  . diclofenac Sodium (VOLTAREN) 1 % GEL APPLY 2 GRAMS TOPICALLY TWICE A DAY AS NEEDED 300 g 3  . DULoxetine (CYMBALTA) 30 MG capsule Take 3 capsules (90 mg total) by mouth daily. 270 capsule 1  . EPINEPHrine (EPIPEN 2-PAK) 0.3 mg/0.3 mL IJ SOAJ injection Inject 0.3 mLs (0.3 mg total) into the muscle once. 1 Device 1  . famotidine (PEPCID) 20 mg Inject 20 mg into the vein 1 day or 1 dose.    Marland Kitchen FIBER PO Take 2 capsules by mouth 2 (two) times daily.    . fluticasone (FLONASE) 50 MCG/ACT nasal spray USE 1 SPRAY IN EACH NOSTRIL DAILY 48 g 5  . Misc Natural Products (TART CHERRY ADVANCED) CAPS 1200mg  by mouth twice daily    . Multiple Vitamin (MULTIVITAMIN) tablet Take 1 tablet by mouth daily.    Marland Kitchen omeprazole (PRILOSEC) 40 MG capsule TAKE 1 CAPSULE DAILY 90 capsule 1  . Probiotic Product (PROBIOTIC DAILY PO) Take 1 capsule by mouth daily.    . rosuvastatin (CRESTOR) 5 MG tablet TAKE 1 TABLET DAILY 90 tablet 1  . vitamin C (ASCORBIC ACID) 500 MG tablet Take 500 mg by mouth daily.    Marland Kitchen Zoster Vaccine Adjuvanted [facility]) injection Inject 0.5mg  IM now and again in 2-6 months. 0.5 mL 1   No facility-administered medications prior to visit.    Allergies  Allergen Reactions  . Aleve [Naproxen Sodium]     Swelling, itching  . Cefuroxime Axetil     Hives / "throat  swelling"  . Chlorzoxazone Rash and Other (See Comments)    "breathing problems"   . Oxycodone     Hallucinations, rash  . Penicillins Anaphylaxis and Hives    Has patient had a PCN reaction causing immediate rash, facial/tongue/throat swelling, SOB or lightheadedness with hypotension: Yes Has patient had a PCN reaction causing severe rash involving mucus membranes or skin necrosis: No Has patient had a PCN reaction that required hospitalization No Has patient had a PCN reaction occurring within the last 10 years: No If all of the above answers are "NO", then may proceed with Cephalosporin use.   . Adhesive [Tape]     rash  . Bupropion     palpitations  . Cataflam [Diclofenac Potassium]     Lip swelling  . Clarithromycin Diarrhea  . Flagyl [Metronidazole Hcl]     ?diarrhea  . Fluzone [Influenza Virus Vaccine]     Had local redness with high dose.   . Glucosamine Hives  . Guaifenesin & Derivatives     Stomach upset  . Hydrocodone-Acetaminophen     REACTION: rash, tightening of throat  . Ketorolac Tromethamine     ?reaction  . Pentazocine     Involuntary twitching  . Pneumovax [Pneumococcal Polysaccharide Vaccine] Other (See Comments)    Redness/swelling at site, nausea  . Prevnar [Pneumococcal 13-Val Conj Vacc] Other (See Comments)    Swelling, redness, nausea  . Smz-Tmp Ds [Sulfamethoxazole W/Trimethoprim (Co-Trimoxazole)]     ?unknown reaction  . Tramadol     constipation  .  Trazodone And Nefazodone Other (See Comments)    Scratchy throat / congestion  . Latex Rash    Review of Systems   See HPI    Objective:    Physical Exam Constitutional:      General: She is not in acute distress.    Appearance: Normal appearance. She is not ill-appearing.  HENT:     Head: Normocephalic and atraumatic.     Right Ear: External ear normal.     Left Ear: External ear normal.  Eyes:     Extraocular Movements: Extraocular movements intact.     Pupils: Pupils are equal,  round, and reactive to light.  Cardiovascular:     Rate and Rhythm: Normal rate and regular rhythm.     Pulses: Normal pulses.     Heart sounds: Normal heart sounds. No murmur heard. No gallop.   Pulmonary:     Effort: Pulmonary effort is normal. No respiratory distress.     Breath sounds: Normal breath sounds. No wheezing, rhonchi or rales.  Skin:    General: Skin is warm and dry.  Neurological:     Mental Status: She is alert and oriented to person, place, and time.  Psychiatric:        Behavior: Behavior normal.     LMP 05/14/1996  Wt Readings from Last 3 Encounters:  07/01/20 153 lb (69.4 kg)  05/30/20 153 lb (69.4 kg)  05/20/20 156 lb 3.2 oz (70.9 kg)       Assessment & Plan:   Problem List Items Addressed This Visit   None      No orders of the defined types were placed in this encounter.   I, Debbrah Alar NP, personally preformed the services described in this documentation.  All medical record entries made by the scribe were at my direction and in my presence.  I have reviewed the chart and discharge instructions (if applicable) and agree that the record reflects my personal performance and is accurate and complete. 12/15/2020   I,Shehryar Baig,acting as a scribe for Nance Pear, NP.,have documented all relevant documentation on the behalf of Nance Pear, NP,as directed by  Nance Pear, NP while in the presence of Nance Pear, NP.   Shehryar [employer]

## 2020-12-17 NOTE — Assessment & Plan Note (Signed)
Stable on cymbalta 90 mg. Continue same.

## 2020-12-17 NOTE — Patient Instructions (Signed)
Please complete lab work prior to leaving.   

## 2020-12-17 NOTE — Assessment & Plan Note (Signed)
She notes a lot of stress. She misses her husband who passed, "I lost my identity."  She continues cymbalta 90 mg. She was working with a Social worker, but has been busy and hasn't seen the counselor in some time.

## 2020-12-17 NOTE — Assessment & Plan Note (Signed)
Trial of metronidazole 0.75% gel which is on her formulary.

## 2020-12-18 LAB — COMPREHENSIVE METABOLIC PANEL
ALT: 13 U/L (ref 0–35)
AST: 16 U/L (ref 0–37)
Albumin: 4.5 g/dL (ref 3.5–5.2)
Alkaline Phosphatase: 63 U/L (ref 39–117)
BUN: 17 mg/dL (ref 6–23)
CO2: 29 mEq/L (ref 19–32)
Calcium: 9.5 mg/dL (ref 8.4–10.5)
Chloride: 101 mEq/L (ref 96–112)
Creatinine, Ser: 0.68 mg/dL (ref 0.40–1.20)
GFR: 87.59 mL/min (ref >60.00)
Glucose, Bld: 103 mg/dL — ABNORMAL HIGH (ref 70–99)
Potassium: 4.2 mEq/L (ref 3.5–5.1)
Sodium: 138 mEq/L (ref 135–145)
Total Bilirubin: 0.7 mg/dL (ref 0.2–1.2)
Total Protein: 6.6 g/dL (ref 6.0–8.3)

## 2020-12-18 LAB — LIPID PANEL
Cholesterol: 154 mg/dL (ref 0–200)
HDL: 41.6 mg/dL (ref >39.00)
LDL Cholesterol: 74 mg/dL (ref 0–99)
NonHDL: 112.35
Total CHOL/HDL Ratio: 4
Triglycerides: 191 mg/dL — ABNORMAL HIGH (ref 0.0–149.0)
VLDL: 38.2 mg/dL (ref 0.0–40.0)

## 2021-01-08 DIAGNOSIS — M5416 Radiculopathy, lumbar region: Secondary | ICD-10-CM | POA: Diagnosis not present

## 2021-01-29 ENCOUNTER — Other Ambulatory Visit: Payer: Self-pay | Admitting: Family

## 2021-01-29 ENCOUNTER — Encounter: Payer: Self-pay | Admitting: Family

## 2021-01-29 MED ORDER — DULOXETINE HCL 30 MG PO CPEP: 90.0000 mg | ORAL_CAPSULE | Freq: Every day | ORAL | 1 refills | Status: DC

## 2021-01-29 NOTE — Telephone Encounter (Signed)
Cymbalta 30 mg last filled in 2021

## 2021-01-30 MED ORDER — DULOXETINE HCL 60 MG PO CPEP: 60.0000 mg | ORAL_CAPSULE | Freq: Every day | ORAL | 0 refills | Status: AC

## 2021-01-30 NOTE — Addendum Note (Signed)
Addended by: Debbrah Alar on: 01/30/2021 09:23 AM   Modules accepted: Orders

## 2021-03-06 ENCOUNTER — Encounter: Payer: Self-pay | Admitting: Family

## 2021-03-06 NOTE — Telephone Encounter (Signed)
Have not seen forms in the office yet

## 2021-03-19 ENCOUNTER — Encounter: Payer: Self-pay | Admitting: Family

## 2021-03-19 MED ORDER — OMEPRAZOLE 40 MG PO CPDR
40.0000 mg | DELAYED_RELEASE_CAPSULE | Freq: Every day | ORAL | 0 refills | Status: AC
Start: 2021-03-19 — End: ?

## 2021-04-29 ENCOUNTER — Telehealth: Payer: Self-pay | Admitting: Pulmonary Disease

## 2021-04-29 NOTE — Telephone Encounter (Signed)
I called the patient to verify that this information was needing to be faxed to her new provider and she reports that this is correct and she has moved to [facility] recently. She reports that she is seeing a new pulmonologist in [place] and will not be back in the office.  I let her know that I will fax the information to the new provider. She did not have any other questions and nothing further is needed.

## 2021-05-14 ENCOUNTER — Telehealth (HOSPITAL_BASED_OUTPATIENT_CLINIC_OR_DEPARTMENT_OTHER): Payer: Self-pay

## 2021-07-14 ENCOUNTER — Encounter: Payer: Self-pay | Admitting: Family

## 2021-07-14 DIAGNOSIS — G4733 Obstructive sleep apnea (adult) (pediatric): Secondary | ICD-10-CM

## 2021-07-14 NOTE — Addendum Note (Signed)
Addended by: Debbrah Alar on: 07/14/2021 12:58 PM   Modules accepted: Orders

## 2022-10-25 ENCOUNTER — Encounter: Payer: Self-pay | Admitting: *Deleted

## 2023-01-20 ENCOUNTER — Encounter: Payer: Self-pay | Admitting: Internal Medicine

## 2023-07-15 NOTE — Telephone Encounter (Signed)
 Sandford Craze , NP removed as PCP
